# Patient Record
Sex: Male | Born: 1945 | Race: Black or African American | Hispanic: No | Smoking: Former smoker
Health system: Southern US, Community
[De-identification: ages and names within clinical notes are randomized; demographics above are authoritative.]

## PROBLEM LIST (undated history)

## (undated) DIAGNOSIS — R011 Cardiac murmur, unspecified: Secondary | ICD-10-CM

## (undated) DIAGNOSIS — K635 Polyp of colon: Secondary | ICD-10-CM

## (undated) DIAGNOSIS — E785 Hyperlipidemia, unspecified: Secondary | ICD-10-CM

## (undated) DIAGNOSIS — I219 Acute myocardial infarction, unspecified: Secondary | ICD-10-CM

## (undated) DIAGNOSIS — M1990 Unspecified osteoarthritis, unspecified site: Secondary | ICD-10-CM

## (undated) DIAGNOSIS — I10 Essential (primary) hypertension: Secondary | ICD-10-CM

## (undated) DIAGNOSIS — I2510 Atherosclerotic heart disease of native coronary artery without angina pectoris: Secondary | ICD-10-CM

## (undated) HISTORY — DX: Acute myocardial infarction, unspecified: I21.9

## (undated) HISTORY — DX: Hyperlipidemia, unspecified: E78.5

## (undated) HISTORY — DX: Unspecified osteoarthritis, unspecified site: M19.90

## (undated) HISTORY — DX: Essential (primary) hypertension: I10

## (undated) HISTORY — DX: Polyp of colon: K63.5

## (undated) HISTORY — PX: TONSILLECTOMY: SUR1361

## (undated) HISTORY — DX: Cardiac murmur, unspecified: R01.1

## (undated) HISTORY — DX: Atherosclerotic heart disease of native coronary artery without angina pectoris: I25.10

---

## 2000-05-08 HISTORY — PX: CHOLECYSTECTOMY: SHX55

## 2003-05-09 DIAGNOSIS — I219 Acute myocardial infarction, unspecified: Secondary | ICD-10-CM

## 2003-05-09 HISTORY — DX: Acute myocardial infarction, unspecified: I21.9

## 2007-05-09 HISTORY — PX: CORONARY ARTERY BYPASS GRAFT: SHX141

## 2008-09-01 ENCOUNTER — Encounter: Payer: Self-pay | Admitting: Internal Medicine

## 2008-09-18 ENCOUNTER — Encounter: Payer: Self-pay | Admitting: Internal Medicine

## 2008-10-13 ENCOUNTER — Ambulatory Visit: Payer: Self-pay | Admitting: Internal Medicine

## 2008-10-13 DIAGNOSIS — E1169 Type 2 diabetes mellitus with other specified complication: Secondary | ICD-10-CM | POA: Insufficient documentation

## 2008-10-13 DIAGNOSIS — R079 Chest pain, unspecified: Secondary | ICD-10-CM

## 2008-10-13 DIAGNOSIS — I10 Essential (primary) hypertension: Secondary | ICD-10-CM | POA: Insufficient documentation

## 2008-10-13 DIAGNOSIS — I2510 Atherosclerotic heart disease of native coronary artery without angina pectoris: Secondary | ICD-10-CM | POA: Insufficient documentation

## 2008-10-13 DIAGNOSIS — E785 Hyperlipidemia, unspecified: Secondary | ICD-10-CM | POA: Insufficient documentation

## 2008-10-13 DIAGNOSIS — R072 Precordial pain: Secondary | ICD-10-CM

## 2008-10-13 DIAGNOSIS — I2583 Coronary atherosclerosis due to lipid rich plaque: Secondary | ICD-10-CM

## 2008-10-19 ENCOUNTER — Telehealth (INDEPENDENT_AMBULATORY_CARE_PROVIDER_SITE_OTHER): Payer: Self-pay | Admitting: *Deleted

## 2008-10-20 ENCOUNTER — Encounter: Payer: Self-pay | Admitting: Internal Medicine

## 2008-10-20 ENCOUNTER — Ambulatory Visit: Payer: Self-pay

## 2008-10-20 ENCOUNTER — Ambulatory Visit: Payer: Self-pay | Admitting: Internal Medicine

## 2009-09-22 ENCOUNTER — Encounter (INDEPENDENT_AMBULATORY_CARE_PROVIDER_SITE_OTHER): Payer: Self-pay | Admitting: *Deleted

## 2010-02-10 ENCOUNTER — Encounter: Payer: Self-pay | Admitting: Gastroenterology

## 2010-03-21 DIAGNOSIS — Z860100 Personal history of colon polyps, unspecified: Secondary | ICD-10-CM | POA: Insufficient documentation

## 2010-06-07 NOTE — Letter (Signed)
Summary: New Patient letter  [name]  [address] [place], [place] [address]   Phone: [phone]  Fax: [phone]       09/22/2009 MRN: [id number]  Christopher Roberts [address] [place], [place]  [address]  Dear [name],  Welcome to the Gastroenterology Division at [facility].    You are scheduled to see Dr.  Arlyce Dice  on 11-01-09 at 2pm on the 3rd floor at [facility], [address].  We ask that you try to arrive at our office 15 minutes prior to your appointment time to allow for check-in.  We would like you to complete the enclosed self-administered evaluation form prior to your visit and bring it with you on the day of your appointment.  We will review it with you.  Also, please bring a complete list of all your medications or, if you prefer, bring the medication bottles and we will list them.  Please bring your insurance card so that we may make a copy of it.  If your insurance requires a referral to see a specialist, please bring your referral form from your primary care physician.  Co-payments are due at the time of your visit and may be paid by cash, check or credit card.     Your office visit will consist of a consult with your physician (includes a physical exam), any laboratory testing he/she may order, scheduling of any necessary diagnostic testing (e.g. x-ray, ultrasound, CT-scan), and scheduling of a procedure (e.g. Endoscopy, Colonoscopy) if required.  Please allow enough time on your schedule to allow for any/all of these possibilities.    If you cannot keep your appointment, please call [phone] to cancel or reschedule prior to your appointment date.  This allows [place] the opportunity to schedule an appointment for another patient in need of care.  If you do not cancel or reschedule by 5 p.m. the business day prior to your appointment date, you will be charged a $50.00 late cancellation/no-show fee.    Thank you for choosing  [facility] for your medical needs.  We appreciate the opportunity to care for you.  Please visit [place] at our website  to learn more about our practice.                     Sincerely,                                                             The Gastroenterology Division

## 2010-06-07 NOTE — Letter (Signed)
Summary: New Patient letter  [facility]  [address] [place], [place] [address]   Phone: [phone]  Fax: [phone]       02/10/2010 MRN: [id number]  Garret Driggs [address] [place], [place]  [address]  Dear Mr. Ganger,  Welcome to the Gastroenterology Division at [facility].    You are scheduled to see Dr.  Jarold Motto on 03-22-10 at 2:45pm on the 3rd floor at [facility], [address].  We ask that you try to arrive at our office 15 minutes prior to your appointment time to allow for check-in.  We would like you to complete the enclosed self-administered evaluation form prior to your visit and bring it with you on the day of your appointment.  We will review it with you.  Also, please bring a complete list of all your medications or, if you prefer, bring the medication bottles and we will list them.  Please bring your insurance card so that we may make a copy of it.  If your insurance requires a referral to see a specialist, please bring your referral form from your primary care physician.  Co-payments are due at the time of your visit and may be paid by cash, check or credit card.     Your office visit will consist of a consult with your physician (includes a physical exam), any laboratory testing he/she may order, scheduling of any necessary diagnostic testing (e.g. x-ray, ultrasound, CT-scan), and scheduling of a procedure (e.g. Endoscopy, Colonoscopy) if required.  Please allow enough time on your schedule to allow for any/all of these possibilities.    If you cannot keep your appointment, please call [phone] to cancel or reschedule prior to your appointment date.  This allows [place] the opportunity to schedule an appointment for another patient in need of care.  If you do not cancel or reschedule by 5 p.m. the business day prior to your appointment date, you will be charged a $50.00 late cancellation/no-show fee.    Thank you for  choosing [facility] for your medical needs.  We appreciate the opportunity to care for you.  Please visit [place] at our website  to learn more about our practice.                     Sincerely,                                                             The Gastroenterology Division

## 2010-10-17 ENCOUNTER — Encounter: Payer: Self-pay | Admitting: Physician Assistant

## 2010-10-18 ENCOUNTER — Encounter: Payer: Self-pay | Admitting: Physician Assistant

## 2010-10-18 ENCOUNTER — Ambulatory Visit (INDEPENDENT_AMBULATORY_CARE_PROVIDER_SITE_OTHER): Payer: Medicare Other | Admitting: Physician Assistant

## 2010-10-18 VITALS — BP 155/86 | HR 72 | Ht 70.0 in | Wt 223.0 lb

## 2010-10-18 DIAGNOSIS — R011 Cardiac murmur, unspecified: Secondary | ICD-10-CM | POA: Insufficient documentation

## 2010-10-18 DIAGNOSIS — I2510 Atherosclerotic heart disease of native coronary artery without angina pectoris: Secondary | ICD-10-CM

## 2010-10-18 DIAGNOSIS — R079 Chest pain, unspecified: Secondary | ICD-10-CM

## 2010-10-18 DIAGNOSIS — I10 Essential (primary) hypertension: Secondary | ICD-10-CM

## 2010-10-18 DIAGNOSIS — E785 Hyperlipidemia, unspecified: Secondary | ICD-10-CM

## 2010-10-18 DIAGNOSIS — Z720 Tobacco use: Secondary | ICD-10-CM

## 2010-10-18 DIAGNOSIS — R0602 Shortness of breath: Secondary | ICD-10-CM | POA: Insufficient documentation

## 2010-10-18 NOTE — Assessment & Plan Note (Signed)
This is mild.  He does have a history of smoking.  He also has a murmur that sounds benign.  Will get an echocardiogram.

## 2010-10-18 NOTE — Assessment & Plan Note (Signed)
Repeat blood pressure by me optimal.  Recent blood pressure with his PCP was also optimal.  Continue current therapy.

## 2010-10-18 NOTE — Patient Instructions (Signed)
Your physician recommends that you schedule a follow-up appointment in: 3-4 MONTHS WITH DR. Gala Romney AS PER SCOTT WEAVER, PA-C   Your physician has requested that you have en exercise stress Myoview 786.50. For further information please visit [email or web address] Please follow instruction sheet, as given.   Your physician has requested that you have an echocardiogram 786.05, 785.2. Echocardiography is a painless test that uses sound waves to create images of your heart. It provides your doctor with information about the size and shape of your heart and how well your heart's chambers and valves are working. This procedure takes approximately one hour. There are no restrictions for this procedure.

## 2010-10-18 NOTE — Assessment & Plan Note (Signed)
Continue aspirin and statin.  Proceed with stress testing as noted.  Follow up with Dr. Gala Romney 3-4 months.

## 2010-10-18 NOTE — Assessment & Plan Note (Signed)
Somewhat atypical.  However, he did have chest burning prior to his bypass.  It is not that limiting.  It has been 2 years since his last stress test.  This demonstrated very faint anterolateral ischemia.  Proceed with stress Myoview.

## 2010-10-18 NOTE — Progress Notes (Signed)
History of Present Illness: Primary Cardiologist:  Dr. Arvilla Meres  Christopher Roberts is a 65 y.o. male with a h/o CAD s/p MI in 2005 (manifested as indigestion) followed CABG x3 in March '09 in [place].  He also has diabetes, hyperlipidemia, previous tobacco use and hypternsion.  He was seen by Dr. Gala Romney in 6/10 to establish.  He had atypical chest pain.  A stress myoview was done and this demonstrated an EF 50%, septal dyskinesis 2/2 CABG, faint anterolateral ischemia (low risk) and medical therapy continued.  He also had a chest CT which was unremarkable (Impression:  Expected postoperative changes from median sternotomy and CABG.  No acute findings or active disease within the thorax.).  He also changed his meds to include coreg for BP.   He has not followed up and we recently received correspondence from Dr. Chilton Si.  He has had some chest pain.  He follows up today.  He complains of chest burning.  He really has noted this symptom since his bypass.  He has a hypertrophic scar and this is somewhat tender.  He notes it with walking.  He especially notes it with going up hills.  It sometimes stops him.  He can often continue to walk with resolution of his symptoms.  He can sometimes walk without symptoms. He also notes dyspnea with exertion.  He probably describes NYHA class II-IIb symptoms.  He denies orthopnea, PND or edema.  He denies syncope.  He denies any radiation to his jaw or arms.  He denies any symptoms reminiscent of his previous angina.  Past Medical History  Diagnosis Date  . CAD (coronary artery disease)     a. s/p MI in 2005 (symptom of "indigestion"); b. CABG x 3 in 3/09: L-LAD, S-OM, EF unknown  . MI (myocardial infarction) 2005    manifested by indigestion  . Diabetes mellitus   . HTN (hypertension)   . DJD (degenerative joint disease)   . Colon polyps   . HLD (hyperlipidemia)     Current Outpatient Prescriptions  Medication Sig Dispense Refill  . aspirin 81 MG tablet  Take 81 mg by mouth daily.        . carvedilol (COREG) 25 MG tablet Take 25 mg by mouth 2 (two) times daily with a meal.        . clopidogrel (PLAVIX) 75 MG tablet Take 75 mg by mouth daily.        Marland Kitchen LISINOPRIL PO Take by mouth daily.        . metFORMIN (GLUCOPHAGE) 500 MG tablet Take one tablet in the morning and two tablets in the evening by mouth.      . simvastatin (ZOCOR) 80 MG tablet Take 80 mg by mouth at bedtime.        Marland Kitchen DISCONTD: carvedilol (COREG) 25 MG tablet Take 25 mg by mouth 2 (two) times daily with a meal.          Allergies: No Known Allergies  Social Hx:  He is going through a divorce and recently started back smoking 1-2 cigs per day.  Vital Signs: BP 155/86  Pulse 72  Ht 5\' 10"  (1.778 m)  Wt 223 lb (101.152 kg)  BMI 32.00 kg/m2 Repeat BP by me on left:  120/70  PHYSICAL EXAM: Well nourished, well developed, in no acute distress HEENT: normal Neck: no JVD Endocrine: No thyromegaly Vascular: No carotid bruits; DP/PT 2+ bilaterally Cardiac:  normal S1, S2; RRR; Short 1/6 systolic murmur RUSB Lungs:  clear  to auscultation bilaterally, no wheezing, rhonchi or rales Abd: soft, nontender, no hepatomegaly Ext: no edema Skin: warm and dry Neuro:  CNs 2-12 intact, no focal abnormalities noted  EKG:  Normal sinus rhythm, heart rate 70, normal axis, nonspecific ST-T wave changes  ASSESSMENT AND PLAN:

## 2010-10-18 NOTE — Assessment & Plan Note (Signed)
He understands that he needs to quit.

## 2010-10-18 NOTE — Assessment & Plan Note (Signed)
Check an echocardiogram as noted. 

## 2010-10-18 NOTE — Assessment & Plan Note (Signed)
Managed by PCP

## 2010-10-19 ENCOUNTER — Encounter: Payer: Self-pay | Admitting: *Deleted

## 2010-10-25 ENCOUNTER — Ambulatory Visit (HOSPITAL_BASED_OUTPATIENT_CLINIC_OR_DEPARTMENT_OTHER): Payer: Medicare Other | Admitting: Radiology

## 2010-10-25 ENCOUNTER — Ambulatory Visit (INDEPENDENT_AMBULATORY_CARE_PROVIDER_SITE_OTHER): Payer: Medicare Other | Admitting: Physician Assistant

## 2010-10-25 ENCOUNTER — Ambulatory Visit (HOSPITAL_COMMUNITY): Payer: Medicare Other | Attending: Internal Medicine | Admitting: Radiology

## 2010-10-25 ENCOUNTER — Encounter: Payer: Self-pay | Admitting: Physician Assistant

## 2010-10-25 ENCOUNTER — Encounter: Payer: Self-pay | Admitting: *Deleted

## 2010-10-25 VITALS — Ht 70.0 in | Wt 222.0 lb

## 2010-10-25 DIAGNOSIS — E785 Hyperlipidemia, unspecified: Secondary | ICD-10-CM | POA: Insufficient documentation

## 2010-10-25 DIAGNOSIS — R0789 Other chest pain: Secondary | ICD-10-CM

## 2010-10-25 DIAGNOSIS — I2510 Atherosclerotic heart disease of native coronary artery without angina pectoris: Secondary | ICD-10-CM

## 2010-10-25 DIAGNOSIS — R9431 Abnormal electrocardiogram [ECG] [EKG]: Secondary | ICD-10-CM

## 2010-10-25 DIAGNOSIS — R011 Cardiac murmur, unspecified: Secondary | ICD-10-CM

## 2010-10-25 DIAGNOSIS — R0602 Shortness of breath: Secondary | ICD-10-CM

## 2010-10-25 DIAGNOSIS — I252 Old myocardial infarction: Secondary | ICD-10-CM | POA: Insufficient documentation

## 2010-10-25 DIAGNOSIS — I25810 Atherosclerosis of coronary artery bypass graft(s) without angina pectoris: Secondary | ICD-10-CM

## 2010-10-25 DIAGNOSIS — R079 Chest pain, unspecified: Secondary | ICD-10-CM | POA: Insufficient documentation

## 2010-10-25 DIAGNOSIS — R9430 Abnormal result of cardiovascular function study, unspecified: Secondary | ICD-10-CM | POA: Insufficient documentation

## 2010-10-25 DIAGNOSIS — Z01810 Encounter for preprocedural cardiovascular examination: Secondary | ICD-10-CM

## 2010-10-25 DIAGNOSIS — E669 Obesity, unspecified: Secondary | ICD-10-CM | POA: Insufficient documentation

## 2010-10-25 DIAGNOSIS — E119 Type 2 diabetes mellitus without complications: Secondary | ICD-10-CM | POA: Insufficient documentation

## 2010-10-25 LAB — PROTIME-INR: INR: 1.1 ratio — ABNORMAL HIGH (ref 0.8–1.0)

## 2010-10-25 LAB — CBC WITH DIFFERENTIAL/PLATELET
Basophils Relative: 0.2 % (ref 0.0–3.0)
Eosinophils Relative: 0.8 % (ref 0.0–5.0)
HCT: 43 % (ref 39.0–52.0)
Hemoglobin: 14.8 g/dL (ref 13.0–17.0)
MCV: 89.7 fl (ref 78.0–100.0)
Monocytes Absolute: 0.5 10*3/uL (ref 0.1–1.0)
Neutro Abs: 6.3 10*3/uL (ref 1.4–7.7)
Neutrophils Relative %: 64 % (ref 43.0–77.0)
RBC: 4.8 Mil/uL (ref 4.22–5.81)
WBC: 9.9 10*3/uL (ref 4.5–10.5)

## 2010-10-25 MED ORDER — TECHNETIUM TC 99M TETROFOSMIN IV KIT
33.0000 | PACK | Freq: Once | INTRAVENOUS | Status: AC | PRN
  Administered 2010-10-25: 33 via INTRAVENOUS

## 2010-10-25 MED ORDER — TECHNETIUM TC 99M TETROFOSMIN IV KIT
11.0000 | PACK | Freq: Once | INTRAVENOUS | Status: AC | PRN
  Administered 2010-10-25: 11 via INTRAVENOUS

## 2010-10-25 MED ORDER — ISOSORBIDE MONONITRATE ER 30 MG PO TB24: 30.0000 mg | ORAL_TABLET | Freq: Every day | ORAL | Status: DC

## 2010-10-25 MED ORDER — NITROGLYCERIN 0.4 MG SL SUBL: 0.4000 mg | SUBLINGUAL_TABLET | SUBLINGUAL | Status: DC | PRN

## 2010-10-25 MED ORDER — REGADENOSON 0.4 MG/5ML IV SOLN
0.4000 mg | Freq: Once | INTRAVENOUS | Status: AC
  Administered 2010-10-25: 0.4 mg via INTRAVENOUS

## 2010-10-25 NOTE — Progress Notes (Signed)
History of Present Illness: Primary Cardiologist:  Dr. Arvilla Meres  Christopher Roberts is a 65 y.o. male with a h/o CAD s/p MI in 2005 (manifested as indigestion) followed CABG x3 in March '09 in [place].  He also has diabetes, hyperlipidemia, previous tobacco use and hypternsion.  He was seen by Dr. Gala Romney in 6/10 to establish.  He had atypical chest pain.  A stress myoview was done and this demonstrated an EF 50%, septal dyskinesis 2/2 CABG, faint anterolateral ischemia (low risk) and medical therapy continued.  He also had a chest CT which was unremarkable (Impression:  Expected postoperative changes from median sternotomy and CABG.  No acute findings or active disease within the thorax.).  He also changed his meds to include coreg for BP.   He has not followed up and we recently received correspondence from Dr. Chilton Si.  He has had some chest pain.  I saw him Last week.  He complained of chest burning with exertion.  He also had a murmur on exam.  He was set up for an echocardiogram and stress Myoview.  His tests were done today.  He had chest pain on the treadmill.  He also had ST segment depression.  His images were abnormal with evidence of anterolateral ischemia.  The stress test was reviewed by Dr. Graciela Husbands, the DOD.  It was recommended that the patient be set up for cardiac catheterization.  He denies any significant changes in his chest discomfort.  He denies any symptoms at rest.  He denies syncope.  Past Medical History  Diagnosis Date  . CAD (coronary artery disease)     a. s/p MI in 2005 (symptom of "indigestion"); b. CABG x 3 in 3/09: L-LAD, S-OM, EF unknown  . MI (myocardial infarction) 2005    manifested by indigestion  . Diabetes mellitus   . HTN (hypertension)   . DJD (degenerative joint disease)   . Colon polyps   . HLD (hyperlipidemia)     Past Surgical History  Procedure Date  . Coronary artery bypass graft 2009    x3  . Cholecystectomy 2002  . Tonsillectomy       Current Outpatient Prescriptions  Medication Sig Dispense Refill  . aspirin 81 MG tablet Take 81 mg by mouth daily.        . carvedilol (COREG) 25 MG tablet Take 25 mg by mouth 2 (two) times daily with a meal.        . clopidogrel (PLAVIX) 75 MG tablet Take 75 mg by mouth daily.        Marland Kitchen LISINOPRIL PO Take 5 mg by mouth daily.       . metFORMIN (GLUCOPHAGE) 500 MG tablet Take one tablet in the morning and two tablets in the evening by mouth.      . simvastatin (ZOCOR) 80 MG tablet Take 80 mg by mouth at bedtime.         No current facility-administered medications for this visit.   Facility-Administered Medications Ordered in Other Visits  Medication Dose Route Frequency Provider Last Rate Last Dose  . regadenoson (LEXISCAN) injection SOLN 0.4 mg  0.4 mg Intravenous Once Dolores Patty, MD   0.4 mg at 10/25/10 0858  . technetium tetrofosmin (TC-MYOVIEW) injection 11 milli Curie  11 milli Curie Intravenous Once PRN Luis Abed, MD   11 milli Curie at 10/25/10 0740  . technetium tetrofosmin (TC-MYOVIEW) injection 33 milli Curie  33 milli Curie Intravenous Once PRN Luis Abed, MD  33 milli Curie at 10/25/10 0900    Allergies: No Known Allergies  Social Hx:  He is going through a divorce and recently started back smoking 1-2 cigs per day.  ROS:  See HPI.  He denies fevers, chills, cough, melena, hematochezia.  All other systems reviewed and negative.  Vital Signs: There were no vitals taken for this visit. Blood pressure before his stress test 157/93 with a pulse of 65  PHYSICAL EXAM: Well nourished, well developed, in no acute distress HEENT: normal Neck: no JVD Endocrine: No thyromegaly Vascular: No carotid bruits; DP/PT 2+ bilaterally; No femoral artery bruits noted bilaterally Cardiac:  normal S1, S2; RRR; Short 1/6 systolic murmur RUSB Lungs:  clear to auscultation bilaterally, no wheezing, rhonchi or rales Abd: soft, nontender, no hepatomegaly Ext: no  edema Skin: warm and dry Neuro:  CNs 2-12 intact, no focal abnormalities noted Psych: Normal affect   ASSESSMENT AND PLAN:

## 2010-10-25 NOTE — Assessment & Plan Note (Signed)
Proceed with cardiac catheterization as noted.

## 2010-10-25 NOTE — Patient Instructions (Signed)
Your physician recommends that you schedule a follow-up appointment in: WILL SCHEDULE FROM HOSPITAL  Your physician has recommended you make the following change in your medication: START IMDUR 30  MG EVERY DAY   AND NITROSTAT  TAKE AS NEEDED  Your physician has requested that you have a cardiac catheterization. Cardiac catheterization is used to diagnose and/or treat various heart conditions. Doctors may recommend this procedure for a number of different reasons. The most common reason is to evaluate chest pain. Chest pain can be a symptom of coronary artery disease (CAD), and cardiac catheterization can show whether plaque is narrowing or blocking your heart's arteries. This procedure is also used to evaluate the valves, as well as measure the blood flow and oxygen levels in different parts of your heart. For further information please visit [email or web address] Please follow instruction sheet, as given. FRI 10/28/10  Your physician recommends that you return for lab work in: TODAY BMET CBC PT PTT  DX V72.81

## 2010-10-25 NOTE — Assessment & Plan Note (Signed)
His stress test is abnormal.  He has a history of CAD with bypass in 2009.  He is having symptoms of exertional chest burning.  Cardiac catheterization is recommended.  Risks and benefits of cardiac catheterization have been discussed with the patient.  These include bleeding, infection, kidney damage, stroke, heart attack, death.  The patient understands these risks and is willing to proceed.   I will place him on isosorbide 30 mg a day.  I will also refill his nitroglycerin p.r.n.  He has been advised to not do anything strenuous until his cardiac catheterization.  This will be set up sometime this week.  He knows to go to the emergency room should he have any unstable symptoms.

## 2010-10-25 NOTE — Progress Notes (Signed)
[facility] SITE 3 NUCLEAR MED [address] [place] [address] [phone]  Cardiology Nuclear Med Study  Christopher Roberts is a 65 y.o. male [id number] 03-06-1946   Nuclear Med Background Indication for Stress Test:  Evaluation for Ischemia and Graft Patency History:  '09 CABGx 3 ([place]), '10 CT/MRI: [place], '05 Myocardial Infarction and '10 Myocardial Perfusion Study: EF 50% min. Anterior/lateral ischemia Cardiac Risk Factors: Family History - CAD, History of Smoking, Hypertension, Lipids, NIDDM and Smoker  Symptoms:  Chest Pain, Chest Burning, DOE, Palpitations and SOB   Nuclear Pre-Procedure Caffeine/Decaff Intake:  None NPO After: 9:30pm   Lungs:  clear IV 0.9% NS with Angio Cath:  20g  IV Site: R Forearm  IV Started by:  Irean Hong, RN  Chest Size (in):  44 Cup Size: n/a  Height: 5\' 10"  (1.778 m)  Weight:  222 lb (100.699 kg)  BMI:  Body mass index is 31.85 kg/(m^2). Tech Comments:  Held coreg x 24 hrs. This patient started with an exercise Myoview and at the 6 minute mark he was switched to a walking Lexiscan. During exercise he couldn't get his HR up, he became very SOB, fatigued, and had chest aching 5/10. There were + changes on his EKG as well.     Nuclear Med Study 1 or 2 day study: 1 day  Stress Test Type:  Treadmill/Lexiscan  Reading MD: Willa Rough, MD  Order Authorizing Provider:  D.Bensimhon  Resting Radionuclide: Technetium 31m Tetrofosmin  Resting Radionuclide Dose: 10.8 mCi   Stress Radionuclide:  Technetium 58m Tetrofosmin  Stress Radionuclide Dose: 33 mCi           Stress Protocol Rest HR: 65 Stress HR: 118  Rest BP: 157/93 Stress BP: 200/89  Exercise Time (min): 7:21 METS: 7.2   Predicted Max HR: 155 bpm % Max HR: 76.13 bpm Rate Pressure Product: 44034   Dose of Adenosine (mg):  n/a Dose of Lexiscan: 0.4 mg  Dose of Atropine (mg): n/a Dose of Dobutamine: n/a mcg/kg/min (at max HR)  Stress Test Technologist: Milana Na,  EMT-P  Nuclear Technologist:  Domenic Polite, CNMT     Rest Procedure:  Myocardial perfusion imaging was performed at rest 45 minutes following the intravenous administration of Technetium 14m Tetrofosmin. Rest ECG: NSR with PVCS  Stress Procedure:  The patient received IV Lexiscan 0.4 mg over 15-seconds with concurrent low level exercise and then Technetium 38m Tetrofosmin was injected at 30-seconds while the patient continued walking one more minute.  There were + significant changes and occ pvcs with Lexiscan.  Quantitative spect images were obtained after a 45-minute delay. Stress ECG: There is ST depression with stress.  QPS Raw Data Images:  Patient motion noted; appropriate software correction applied. Stress Images:  There is moderate decrease in activity in a moderate area of the mid anterior segment and the mid and apical anterolateral wall. Rest Images:  Normal homogeneous uptake in all areas of the myocardium. Subtraction (SDS):  There is ischemia Transient Ischemic Dilatation (Normal <1.22):  .76 Lung/Heart Ratio (Normal <0.45):  .33  Quantitative Gated Spect Images QGS EDV:  78 ml QGS ESV:  29 ml QGS cine images:  Septal dyssynergy QGS EF:63%  Impression Exercise Capacity:  Lexiscan with low level exercise. BP Response:  Hypertensive blood pressure response. Clinical Symptoms:  Chest ache. ECG Impression:  Abnormal Comparison with Prior Nuclear Study:  New abnormality since 2010  Overall Impression:  There was hypertensive response to walking. There was inadequate  heart rate and study changed to Lexiscan. There is ST depression seen. The images show moderate ischemia in the anterolateral wall. Dr. Gala Romney saw the study immediately, and the patient is to undergo cath.  Willa Rough

## 2010-10-25 NOTE — Assessment & Plan Note (Addendum)
Echocardiogram done today.  This was reviewed by me.  The report indicates he is normally function, upper septal thickening but no LVOT gradient and no valvular abnormalities.

## 2010-10-25 NOTE — Assessment & Plan Note (Signed)
Elevated today.  However, he has not taken his blood pressure medicines yet today.  Add isosorbide to his regimen.

## 2010-10-26 ENCOUNTER — Encounter: Payer: Self-pay | Admitting: *Deleted

## 2010-10-26 ENCOUNTER — Other Ambulatory Visit (INDEPENDENT_AMBULATORY_CARE_PROVIDER_SITE_OTHER): Payer: Medicare Other | Admitting: *Deleted

## 2010-10-26 ENCOUNTER — Other Ambulatory Visit: Payer: Self-pay | Admitting: *Deleted

## 2010-10-26 DIAGNOSIS — I2510 Atherosclerotic heart disease of native coronary artery without angina pectoris: Secondary | ICD-10-CM

## 2010-10-26 DIAGNOSIS — Z01810 Encounter for preprocedural cardiovascular examination: Secondary | ICD-10-CM

## 2010-10-26 DIAGNOSIS — R0602 Shortness of breath: Secondary | ICD-10-CM

## 2010-10-26 DIAGNOSIS — R072 Precordial pain: Secondary | ICD-10-CM

## 2010-10-26 DIAGNOSIS — R079 Chest pain, unspecified: Secondary | ICD-10-CM

## 2010-10-26 LAB — BASIC METABOLIC PANEL
BUN: 16 mg/dL (ref 6–23)
CO2: 22 mEq/L (ref 19–32)
Chloride: 109 mEq/L (ref 96–112)
Potassium: 4 mEq/L (ref 3.5–5.1)

## 2010-10-28 ENCOUNTER — Inpatient Hospital Stay (HOSPITAL_BASED_OUTPATIENT_CLINIC_OR_DEPARTMENT_OTHER)
Admission: RE | Admit: 2010-10-28 | Discharge: 2010-10-28 | Disposition: A | Discharge status: Home / Self Care | Payer: Medicare Other | Source: Ambulatory Visit | Attending: Cardiology | Admitting: Cardiology

## 2010-10-28 DIAGNOSIS — I25810 Atherosclerosis of coronary artery bypass graft(s) without angina pectoris: Secondary | ICD-10-CM | POA: Insufficient documentation

## 2010-10-28 DIAGNOSIS — R9439 Abnormal result of other cardiovascular function study: Secondary | ICD-10-CM | POA: Insufficient documentation

## 2010-10-28 DIAGNOSIS — I2510 Atherosclerotic heart disease of native coronary artery without angina pectoris: Secondary | ICD-10-CM

## 2010-10-28 DIAGNOSIS — R0789 Other chest pain: Secondary | ICD-10-CM | POA: Insufficient documentation

## 2010-10-28 DIAGNOSIS — I2582 Chronic total occlusion of coronary artery: Secondary | ICD-10-CM | POA: Insufficient documentation

## 2010-10-28 LAB — POCT I-STAT GLUCOSE
Glucose, Bld: 157 mg/dL — ABNORMAL HIGH (ref 70–99)
Operator id: 221371

## 2010-11-02 ENCOUNTER — Encounter: Payer: Self-pay | Admitting: Internal Medicine

## 2010-11-02 ENCOUNTER — Ambulatory Visit (INDEPENDENT_AMBULATORY_CARE_PROVIDER_SITE_OTHER): Payer: Medicare Other | Admitting: Internal Medicine

## 2010-11-02 VITALS — BP 96/58 | HR 73 | Ht 70.0 in | Wt 225.0 lb

## 2010-11-02 DIAGNOSIS — I10 Essential (primary) hypertension: Secondary | ICD-10-CM

## 2010-11-02 DIAGNOSIS — I2510 Atherosclerotic heart disease of native coronary artery without angina pectoris: Secondary | ICD-10-CM

## 2010-11-02 NOTE — Patient Instructions (Signed)
Check your BP and let us know if staying below 100 on the top  Your physician wants you to follow-up in: 4 months.  You will receive a reminder letter in the mail two months in advance. If you don't receive a letter, please call our office to schedule the follow-up appointment.

## 2010-11-02 NOTE — Progress Notes (Signed)
History of Present Illness: PCP: Dr. Nila Nephew  Christopher Roberts is a 65 y.o. male with a h/o CAD s/p MI in 2005 (manifested as indigestion) followed CABG x3 in March '09 in [place].  He also has diabetes, hyperlipidemia, previous tobacco use and hypternsion.    Recently had CP and saw Tereso Newcomer. He was set up for an echocardiogram and stress Myoview.  His images were abnormal with evidence of anterolateral ischemia.  Underwent cath 10/28/10:  Which showed:    1. Preserved overall left ventricular function.   2. High-grade left anterior descending stenosis with patent internal       mammary to the distal LAD.   3. Occluded vein graft limb to the ramus intermedius with       collateralization of this vessel from multiple sources.   4. Small, but severely diseased first diagonal branch.   5. Distal circumflex with 80% stenosis.   6. 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   Treated medically with suggestion that distal LCX could be stented if CP persisted. Started on NTG an has done very well No further CP. Referred to cardiac rehab. BP has been soft. No syncope      Past Medical History  Diagnosis Date  . CAD (coronary artery disease)     a. s/p MI in 2005 (symptom of "indigestion"); b. CABG x 3 in 3/09: L-LAD, S-OM, EF unknown  . MI (myocardial infarction) 2005    manifested by indigestion  . Diabetes mellitus   . HTN (hypertension)   . DJD (degenerative joint disease)   . Colon polyps   . HLD (hyperlipidemia)   . Murmur     echo 6/12:  Upper septal thickening, no LVOT gradient,, EF 65%, mild LAE      Past Surgical History  Procedure Date  . Coronary artery bypass graft 2009    x3  . Cholecystectomy 2002  . Tonsillectomy      Current Outpatient Prescriptions  Medication Sig Dispense Refill  . aspirin 81 MG tablet Take 81 mg by mouth daily.        . carvedilol (COREG) 25 MG tablet Take 25 mg by mouth 2 (two) times daily  with a meal.        . clopidogrel (PLAVIX) 75 MG tablet Take 75 mg by mouth daily.        . isosorbide mononitrate (IMDUR) 30 MG 24 hr tablet Take 1 tablet (30 mg total) by mouth daily.  30 tablet  11  . LISINOPRIL PO Take 5 mg by mouth daily.       . metFORMIN (GLUCOPHAGE) 500 MG tablet Take one tablet in the morning and two tablets in the evening by mouth.      . nitroGLYCERIN (NITROSTAT) 0.4 MG SL tablet Place 1 tablet (0.4 mg total) under the tongue every 5 (five) minutes as needed for chest pain.  100 tablet  3  . simvastatin (ZOCOR) 80 MG tablet Take 80 mg by mouth at bedtime.          Allergies: No Known Allergies  Social Hx:  He is going through a divorce and recently started back smoking 1-2 cigs per day.  ROS:  See HPI.  He denies fevers, chills, cough, melena, hematochezia.  All other systems reviewed and negative.  Vital Signs: BP 96/58  Pulse 73  Ht 5\' 10"  (1.778 m)  Wt 225 lb (102.059 kg)  BMI 32.28 kg/m2  PHYSICAL EXAM: Well nourished, well developed, in no acute distress HEENT: normal Neck: no JVD Endocrine: No thyromegaly Vascular: No carotid bruits; DP/PT 2+ bilaterally; No femoral artery bruits noted bilaterally Cardiac:  normal S1, S2; RRR; Short 1/6 systolic murmur RUSB Lungs:  clear to auscultation bilaterally, no wheezing, rhonchi or rales Abd: soft, nontender, no hepatomegaly Ext: no edema Skin: warm and dry Neuro:  CNs 2-12 intact, no focal abnormalities noted Psych: Normal affect   ASSESSMENT AND PLAN:

## 2010-11-09 NOTE — Assessment & Plan Note (Signed)
As per CAD section.

## 2010-11-09 NOTE — Assessment & Plan Note (Signed)
Reviewed cath results with him in detail. Doing well with medical therapy. Will continue.I think he will also benefit from cardiac rehab. BP is on low side but i would prefer to continue Imdur and beta-blocker for his heart disease and low-dose ACE-I for his DM2.  He will follow his BPs closely. If SBP consistently < 100 or developing symptoms may need to stop lisinopril.

## 2010-11-24 NOTE — Cardiovascular Report (Signed)
Christopher Roberts, Christopher Roberts                ACCOUNT NO.:  [id number]  MEDICAL RECORD NO.:  [id number]  LOCATION:                                 FACILITY:  PHYSICIAN:  Arturo Morton. Riley Kill, MD, FACCDATE OF BIRTH:  [birth year]  DATE OF PROCEDURE: DATE OF DISCHARGE:                           CARDIAC CATHETERIZATION   INDICATIONS:  Christopher Roberts is a gentleman who previously underwent bypass surgery in [place] in 2009.  I have reviewed both his catheterization report as well as his operative note.  He had an internal mammary to the LAD and sequential saphenous vein graft to the ramus distal circumflex or PDA.  The RCA was occluded, not bypassed, felt to be nondominant.  He has had some chest tightness, and his nuclear study demonstrates anterior anterolateral ischemia.  There is complete redistribution.  The current study was done to assess coronary anatomy and his graft status. Risks, benefits, and alternatives were discussed with the patient.  He consented to proceed.  PROCEDURE:  Procedure was performed from the right femoral artery using 4-French catheters.  He tolerated the procedure without complication, was taken to the holding area in satisfactory clinical condition.  I reviewed the films with the patient and subsequently with the daughter.  HEMODYNAMIC DATA: 1. The central aortic pressure is 112/77, mean 93. 2. LV pressure 123/22. 3. There is no gradient or pullback across the aortic valve.  ANGIOGRAPHIC DATA: 1. Ventriculography was done in the RAO projection.  Overall systolic     function was preserved.  No segmental abnormalities or contraction     were identified.  Ejection fraction was sought to be in excess of     60%. 2. The left main coronary artery was tapered distally probably about     50%-60%. 3. The left anterior descending artery demonstrates competitive     filling in the distal vessel.  There is an 80% lesion beyond the     takeoff of the ramus and the diagonal.   The ramus is diseased in     the midportion, and collaterals are noted from other locations.     There is a diagonal branch which is small and severely diseased and     could also be a source of anterolateral ischemia. 4. The AV circumflex has diffuse 50% narrowing and then there is the     takeoff of a marginal branch or posterolateral branch distally.     Distal to this, there is an AV circumflex, which incompletely     fills.  The leading into that marginal branch was about 80%     narrowing and then a 50%-60% area of narrowing as well and this is     in the inferolateral segment. 5. There is a saphenous vein graft which is widely patent.  It goes to     what appears to be the posterior descending branch or co-posterior     descending branch distally.  There is proximal anastomosis that was     suppose to be on this graft, going to the ramus, and that is not     visualized. 6. The internal mammary to the distal LAD is widely patent.  There are     collaterals going to the ramus intermedius from this territory.     There are also collaterals noted during the native left injection     to the ramus intermedius. 7. The right coronary artery is totally occluded and the distal right     coronary fills by collaterals from septal perforators and AV     circumflex native vessel.  CONCLUSION: 1. Preserved overall left ventricular function. 2. High-grade left anterior descending stenosis with patent internal     mammary to the distal LAD. 3. Occluded vein graft limb to the ramus intermedius with     collateralization of this vessel from multiple sources. 4. Small, but severely diseased first diagonal branch. 5. Distal circumflex with 80% stenosis. 6. Total occlusion of the right coronary artery with left-to-right     collaterals.  DISPOSITION:  At the present time, the internal mammary graft and the vein graft to the distal PDA are widely patent.  The limb to the ramus intermedius is  occluded, but this is well collateralized.  The diagonal ramus may well be the source of the patient's ischemia.  The circumflex distally is a target, could potentially be approached, but would not completely resolve the ischemia based on the anatomic location.  At the present time, medical therapy would be recommended, with close follow-up with Dr. Gala Romney.     Arturo Morton. Riley Kill, MD, [facility]     TDS/MEDQ  D:  10/28/2010  T:  10/29/2010  Job:  [id number]  cc:   Bevelyn Buckles. Bensimhon, MD Erskine Speed, M.D. CV laboratory  Electronically Signed by Shawnie Pons MD [facility] on 11/24/2010 07:23:17 AM

## 2010-12-21 ENCOUNTER — Ambulatory Visit: Payer: Medicare Other | Admitting: Internal Medicine

## 2011-07-18 ENCOUNTER — Other Ambulatory Visit: Payer: Self-pay | Admitting: *Deleted

## 2011-07-20 ENCOUNTER — Other Ambulatory Visit: Payer: Self-pay | Admitting: *Deleted

## 2011-07-20 DIAGNOSIS — R9430 Abnormal result of cardiovascular function study, unspecified: Secondary | ICD-10-CM

## 2011-07-20 MED ORDER — ISOSORBIDE MONONITRATE ER 30 MG PO TB24: 30.0000 mg | ORAL_TABLET | Freq: Every day | ORAL | Status: DC

## 2012-05-07 ENCOUNTER — Ambulatory Visit (INDEPENDENT_AMBULATORY_CARE_PROVIDER_SITE_OTHER): Payer: 59 | Admitting: Licensed Clinical Social Worker

## 2012-05-07 DIAGNOSIS — F411 Generalized anxiety disorder: Secondary | ICD-10-CM

## 2012-05-07 DIAGNOSIS — F331 Major depressive disorder, recurrent, moderate: Secondary | ICD-10-CM

## 2012-05-21 ENCOUNTER — Ambulatory Visit (INDEPENDENT_AMBULATORY_CARE_PROVIDER_SITE_OTHER): Payer: 59 | Admitting: Licensed Clinical Social Worker

## 2012-05-21 DIAGNOSIS — F331 Major depressive disorder, recurrent, moderate: Secondary | ICD-10-CM

## 2012-05-21 DIAGNOSIS — F409 Phobic anxiety disorder, unspecified: Secondary | ICD-10-CM

## 2012-06-05 ENCOUNTER — Ambulatory Visit: Payer: 59 | Admitting: Licensed Clinical Social Worker

## 2012-07-23 ENCOUNTER — Other Ambulatory Visit: Payer: Self-pay | Admitting: *Deleted

## 2012-07-23 DIAGNOSIS — R9430 Abnormal result of cardiovascular function study, unspecified: Secondary | ICD-10-CM

## 2012-07-23 MED ORDER — ISOSORBIDE MONONITRATE ER 30 MG PO TB24: 30.0000 mg | ORAL_TABLET | Freq: Every day | ORAL | Status: DC

## 2012-10-21 ENCOUNTER — Other Ambulatory Visit (HOSPITAL_COMMUNITY): Payer: Self-pay | Admitting: *Deleted

## 2012-10-21 DIAGNOSIS — R9430 Abnormal result of cardiovascular function study, unspecified: Secondary | ICD-10-CM

## 2012-10-21 MED ORDER — ISOSORBIDE MONONITRATE ER 30 MG PO TB24: 30.0000 mg | ORAL_TABLET | Freq: Every day | ORAL | Status: DC

## 2014-07-06 DIAGNOSIS — E1165 Type 2 diabetes mellitus with hyperglycemia: Secondary | ICD-10-CM | POA: Diagnosis not present

## 2014-07-06 DIAGNOSIS — I2510 Atherosclerotic heart disease of native coronary artery without angina pectoris: Secondary | ICD-10-CM | POA: Diagnosis not present

## 2014-07-06 DIAGNOSIS — I10 Essential (primary) hypertension: Secondary | ICD-10-CM | POA: Diagnosis not present

## 2014-10-19 DIAGNOSIS — H33102 Unspecified retinoschisis, left eye: Secondary | ICD-10-CM | POA: Diagnosis not present

## 2014-10-19 DIAGNOSIS — E11339 Type 2 diabetes mellitus with moderate nonproliferative diabetic retinopathy without macular edema: Secondary | ICD-10-CM | POA: Diagnosis not present

## 2014-10-19 DIAGNOSIS — H4423 Degenerative myopia, bilateral: Secondary | ICD-10-CM | POA: Diagnosis not present

## 2015-09-17 DIAGNOSIS — E109 Type 1 diabetes mellitus without complications: Secondary | ICD-10-CM | POA: Diagnosis not present

## 2015-09-17 DIAGNOSIS — Z125 Encounter for screening for malignant neoplasm of prostate: Secondary | ICD-10-CM | POA: Diagnosis not present

## 2015-09-17 DIAGNOSIS — E7800 Pure hypercholesterolemia, unspecified: Secondary | ICD-10-CM | POA: Diagnosis not present

## 2015-09-17 DIAGNOSIS — D559 Anemia due to enzyme disorder, unspecified: Secondary | ICD-10-CM | POA: Diagnosis not present

## 2015-12-14 DIAGNOSIS — Z23 Encounter for immunization: Secondary | ICD-10-CM | POA: Diagnosis not present

## 2015-12-14 DIAGNOSIS — E109 Type 1 diabetes mellitus without complications: Secondary | ICD-10-CM | POA: Diagnosis not present

## 2016-06-19 DIAGNOSIS — I2510 Atherosclerotic heart disease of native coronary artery without angina pectoris: Secondary | ICD-10-CM | POA: Diagnosis not present

## 2016-06-19 DIAGNOSIS — H6122 Impacted cerumen, left ear: Secondary | ICD-10-CM | POA: Diagnosis not present

## 2016-06-19 DIAGNOSIS — E119 Type 2 diabetes mellitus without complications: Secondary | ICD-10-CM | POA: Diagnosis not present

## 2016-08-30 ENCOUNTER — Observation Stay (HOSPITAL_COMMUNITY)
Admission: EM | Admit: 2016-08-30 | Discharge: 2016-09-02 | Disposition: A | Discharge status: Home / Self Care | Payer: Medicare Other | Attending: Nephrology | Admitting: Nephrology

## 2016-08-30 ENCOUNTER — Encounter (HOSPITAL_COMMUNITY): Payer: Self-pay | Admitting: Emergency Medicine

## 2016-08-30 ENCOUNTER — Emergency Department (HOSPITAL_COMMUNITY): Payer: Medicare Other

## 2016-08-30 DIAGNOSIS — I25810 Atherosclerosis of coronary artery bypass graft(s) without angina pectoris: Secondary | ICD-10-CM | POA: Insufficient documentation

## 2016-08-30 DIAGNOSIS — Z7982 Long term (current) use of aspirin: Secondary | ICD-10-CM | POA: Diagnosis not present

## 2016-08-30 DIAGNOSIS — R938 Abnormal findings on diagnostic imaging of other specified body structures: Secondary | ICD-10-CM | POA: Insufficient documentation

## 2016-08-30 DIAGNOSIS — R112 Nausea with vomiting, unspecified: Secondary | ICD-10-CM | POA: Diagnosis not present

## 2016-08-30 DIAGNOSIS — R42 Dizziness and giddiness: Secondary | ICD-10-CM

## 2016-08-30 DIAGNOSIS — I2510 Atherosclerotic heart disease of native coronary artery without angina pectoris: Secondary | ICD-10-CM | POA: Diagnosis present

## 2016-08-30 DIAGNOSIS — I160 Hypertensive urgency: Secondary | ICD-10-CM | POA: Diagnosis not present

## 2016-08-30 DIAGNOSIS — R1110 Vomiting, unspecified: Secondary | ICD-10-CM | POA: Diagnosis present

## 2016-08-30 DIAGNOSIS — R1114 Bilious vomiting: Secondary | ICD-10-CM

## 2016-08-30 DIAGNOSIS — I639 Cerebral infarction, unspecified: Secondary | ICD-10-CM

## 2016-08-30 DIAGNOSIS — I10 Essential (primary) hypertension: Secondary | ICD-10-CM | POA: Diagnosis not present

## 2016-08-30 DIAGNOSIS — E119 Type 2 diabetes mellitus without complications: Secondary | ICD-10-CM

## 2016-08-30 DIAGNOSIS — I252 Old myocardial infarction: Secondary | ICD-10-CM | POA: Insufficient documentation

## 2016-08-30 DIAGNOSIS — Z951 Presence of aortocoronary bypass graft: Secondary | ICD-10-CM | POA: Insufficient documentation

## 2016-08-30 DIAGNOSIS — Z79899 Other long term (current) drug therapy: Secondary | ICD-10-CM | POA: Diagnosis not present

## 2016-08-30 DIAGNOSIS — F17210 Nicotine dependence, cigarettes, uncomplicated: Secondary | ICD-10-CM | POA: Insufficient documentation

## 2016-08-30 DIAGNOSIS — I2583 Coronary atherosclerosis due to lipid rich plaque: Secondary | ICD-10-CM

## 2016-08-30 LAB — URINALYSIS, ROUTINE W REFLEX MICROSCOPIC
BACTERIA UA: NONE SEEN
Bilirubin Urine: NEGATIVE
Glucose, UA: >=500 mg/dL — AB
Ketones, ur: 5 mg/dL — AB
Leukocytes, UA: NEGATIVE
NITRITE: NEGATIVE
PH: 5 (ref 5.0–8.0)
Protein, ur: >=300 mg/dL — AB
SPECIFIC GRAVITY, URINE: 1.017 (ref 1.005–1.030)

## 2016-08-30 LAB — COMPREHENSIVE METABOLIC PANEL
ALBUMIN: 4 g/dL (ref 3.5–5.0)
ALK PHOS: 67 U/L (ref 38–126)
ALT: 14 U/L — ABNORMAL LOW (ref 17–63)
ANION GAP: 11 (ref 5–15)
AST: 18 U/L (ref 15–41)
BILIRUBIN TOTAL: 0.8 mg/dL (ref 0.3–1.2)
BUN: 11 mg/dL (ref 6–20)
CALCIUM: 9.3 mg/dL (ref 8.9–10.3)
CO2: 20 mmol/L — ABNORMAL LOW (ref 22–32)
Chloride: 105 mmol/L (ref 101–111)
Creatinine, Ser: 1.01 mg/dL (ref 0.61–1.24)
GFR calc non Af Amer: >60 mL/min (ref >60)
GLUCOSE: 225 mg/dL — AB (ref 65–99)
POTASSIUM: 4.1 mmol/L (ref 3.5–5.1)
Sodium: 136 mmol/L (ref 135–145)
TOTAL PROTEIN: 7.6 g/dL (ref 6.5–8.1)

## 2016-08-30 LAB — CBC WITH DIFFERENTIAL/PLATELET
BASOS PCT: 0 %
Basophils Absolute: 0 10*3/uL (ref 0.0–0.1)
EOS ABS: 0.2 10*3/uL (ref 0.0–0.7)
Eosinophils Relative: 1 %
HEMATOCRIT: 43.6 % (ref 39.0–52.0)
HEMOGLOBIN: 15.1 g/dL (ref 13.0–17.0)
LYMPHS ABS: 3.4 10*3/uL (ref 0.7–4.0)
Lymphocytes Relative: 29 %
MCH: 29.1 pg (ref 26.0–34.0)
MCHC: 34.6 g/dL (ref 30.0–36.0)
MCV: 84 fL (ref 78.0–100.0)
MONO ABS: 0.7 10*3/uL (ref 0.1–1.0)
MONOS PCT: 6 %
NEUTROS ABS: 7.6 10*3/uL (ref 1.7–7.7)
Neutrophils Relative %: 64 %
Platelets: 191 10*3/uL (ref 150–400)
RBC: 5.19 MIL/uL (ref 4.22–5.81)
RDW: 13 % (ref 11.5–15.5)
WBC: 11.9 10*3/uL — ABNORMAL HIGH (ref 4.0–10.5)

## 2016-08-30 LAB — I-STAT TROPONIN, ED: Troponin i, poc: 0.01 ng/mL (ref 0.00–0.08)

## 2016-08-30 MED ORDER — ONDANSETRON 4 MG PO TBDP
4.0000 mg | ORAL_TABLET | Freq: Once | ORAL | Status: AC
  Administered 2016-08-30: 4 mg via ORAL

## 2016-08-30 MED ORDER — MECLIZINE HCL 25 MG PO TABS
25.0000 mg | ORAL_TABLET | Freq: Once | ORAL | Status: AC
  Administered 2016-08-30: 25 mg via ORAL
  Filled 2016-08-30: qty 1

## 2016-08-30 MED ORDER — PROMETHAZINE HCL 25 MG PO TABS: 25.0000 mg | ORAL_TABLET | Freq: Four times a day (QID) | ORAL | Status: DC | PRN

## 2016-08-30 MED ORDER — LABETALOL HCL 5 MG/ML IV SOLN
10.0000 mg | Freq: Once | INTRAVENOUS | Status: AC
  Administered 2016-08-30: 10 mg via INTRAVENOUS
  Filled 2016-08-30: qty 4

## 2016-08-30 MED ORDER — PROMETHAZINE HCL 25 MG/ML IJ SOLN
12.5000 mg | Freq: Four times a day (QID) | INTRAMUSCULAR | Status: DC | PRN
  Administered 2016-08-30: 12.5 mg via INTRAVENOUS
  Filled 2016-08-30: qty 1

## 2016-08-30 MED ORDER — IOPAMIDOL (ISOVUE-300) INJECTION 61%
INTRAVENOUS | Status: AC
  Administered 2016-08-31: 100 mL
  Filled 2016-08-30: qty 100

## 2016-08-30 MED ORDER — SODIUM CHLORIDE 0.9 % IV BOLUS (SEPSIS)
500.0000 mL | Freq: Once | INTRAVENOUS | Status: AC
  Administered 2016-08-30: 500 mL via INTRAVENOUS

## 2016-08-30 MED ORDER — PROMETHAZINE HCL 25 MG RE SUPP
25.0000 mg | Freq: Four times a day (QID) | RECTAL | Status: DC | PRN
  Filled 2016-08-30: qty 1

## 2016-08-30 MED ORDER — ONDANSETRON 4 MG PO TBDP
ORAL_TABLET | ORAL | Status: AC
  Filled 2016-08-30: qty 1

## 2016-08-30 NOTE — ED Notes (Signed)
Pt back in room.

## 2016-08-30 NOTE — ED Notes (Signed)
EDP at bedside  

## 2016-08-30 NOTE — ED Provider Notes (Signed)
[facility]-EMERGENCY DEPT Provider Note   CSN: [id number] Arrival date & time: 08/30/16  1800     History   Chief Complaint Chief Complaint  Patient presents with  . Nausea  . Emesis  . Dizziness    HPI AURTHUR WINGERTER is a 71 y.o. male.  HPI Patient presents with acute onset dizziness associated with nausea and 2 episodes of vomiting starting at 3 PM. It is worse with position change. States he has bilateral buzzing in his years. Denies headache or visual changes. Has difficulty characterizing dizziness. Denies any chest pain or shortness of breath. Denies abdominal pain. Endorses urinary frequency but denies hematuria or dysuria. No focal weakness or numbness.  Past Medical History:  Diagnosis Date  . CAD (coronary artery disease)    a. s/p MI in 2005 (symptom of "indigestion"); b. CABG x 3 in 3/09: L-LAD, S-OM, EF unknown  . Colon polyps   . Diabetes mellitus   . DJD (degenerative joint disease)   . HLD (hyperlipidemia)   . HTN (hypertension)   . MI (myocardial infarction) (HCC) 2005   manifested by indigestion  . Murmur    echo 6/12:  Upper septal thickening, no LVOT gradient,, EF 65%, mild LAE      Patient Active Problem List   Diagnosis Date Noted  . Hypertensive urgency 08/30/2016  . Abnormal stress test 10/25/2010  . Shortness of breath 10/18/2010  . Murmur 10/18/2010  . Tobacco abuse 10/18/2010  . COLONIC POLYPS, HX OF 03/21/2010  . HYPERLIPIDEMIA 10/13/2008  . HYPERTENSION, BENIGN 10/13/2008  . Coronary atherosclerosis of native coronary artery 10/13/2008  . CHEST PAIN-UNSPECIFIED 10/13/2008  . CHEST PAIN-PRECORDIAL 10/13/2008    Past Surgical History:  Procedure Laterality Date  . CHOLECYSTECTOMY  2002  . CORONARY ARTERY BYPASS GRAFT  2009   x3  . TONSILLECTOMY         Home Medications    Prior to Admission medications   Medication Sig Start Date End Date Taking? Authorizing Provider  aspirin 81 MG tablet Take 81 mg by mouth daily.       Historical Provider, MD  carvedilol (COREG) 25 MG tablet Take 25 mg by mouth 2 (two) times daily with a meal.      Historical Provider, MD  clopidogrel (PLAVIX) 75 MG tablet Take 75 mg by mouth daily.      Historical Provider, MD  isosorbide mononitrate (IMDUR) 30 MG 24 hr tablet Take 1 tablet (30 mg total) by mouth daily. 10/21/12 10/21/13  Bevelyn Buckles Bensimhon, MD  LISINOPRIL PO Take 5 mg by mouth daily.     Historical Provider, MD  metFORMIN (GLUCOPHAGE) 500 MG tablet Take one tablet in the morning and two tablets in the evening by mouth.    Historical Provider, MD  nitroGLYCERIN (NITROSTAT) 0.4 MG SL tablet Place 1 tablet (0.4 mg total) under the tongue every 5 (five) minutes as needed for chest pain. 10/25/10 10/25/11  Beatrice Lecher, PA-C  simvastatin (ZOCOR) 80 MG tablet Take 80 mg by mouth at bedtime.      Historical Provider, MD    Family History Family History  Problem Relation Age of Onset  . Colon cancer Mother   . Heart attack Mother     Social History Social History  Substance Use Topics  . Smoking status: Current Every Day Smoker    Types: Cigarettes  . Smokeless tobacco: Never Used     Comment: quit march 2010 after 20 yrs  . Alcohol use No  Allergies   Patient has no known allergies.   Review of Systems Review of Systems  Constitutional: Negative for chills and fever.  HENT: Positive for congestion, sinus pressure and tinnitus.   Eyes: Negative for visual disturbance.  Respiratory: Negative for cough and shortness of breath.   Cardiovascular: Negative for chest pain and leg swelling.  Gastrointestinal: Positive for nausea and vomiting. Negative for abdominal pain and constipation.  Genitourinary: Positive for frequency. Negative for dysuria, flank pain and hematuria.  Musculoskeletal: Negative for back pain, myalgias and neck pain.  Skin: Negative for rash and wound.  Neurological: Positive for dizziness and light-headedness. Negative for syncope, weakness,  numbness and headaches.  All other systems reviewed and are negative.    Physical Exam Updated Vital Signs BP (!) 179/105   Pulse (!) 58   Temp 98.4 F (36.9 C)   Resp 12   Ht  (1.803 m)   Wt 220 lb (99.8 kg)   SpO2 96%   BMI 30.68 kg/m   Physical Exam  Constitutional: He is oriented to person, place, and time. He appears well-developed and well-nourished. No distress.  HENT:  Head: Normocephalic and atraumatic.  Mouth/Throat: Oropharynx is clear and moist. No oropharyngeal exudate.  TMs are occluded by cerumen.  Eyes: EOM are normal. Pupils are equal, round, and reactive to light.  No nystagmus appreciated.  Neck: Normal range of motion. Neck supple. No JVD present.  Cardiovascular: Normal rate and regular rhythm.  Exam reveals no gallop and no friction rub.   No murmur heard. Pulmonary/Chest: Effort normal and breath sounds normal. No respiratory distress. He has no wheezes. He has no rales. He exhibits no tenderness.  Abdominal: Soft. Bowel sounds are normal. There is no tenderness. There is no rebound and no guarding.  Musculoskeletal: Normal range of motion. He exhibits no edema or tenderness.  No lower extremity swelling, asymmetry or tenderness.  Neurological: He is alert and oriented to person, place, and time.  5/5 motor in all extremities. Sensation fully intact. Normal right sided finger to nose testing. Slight impaired coordination with left sided finger to nose testing. Cranial nerves II through XII grossly intact. Sensation to light touch intact.  Skin: Skin is warm and dry. Capillary refill takes less than 2 seconds. No rash noted. No erythema.  Psychiatric: He has a normal mood and affect. His behavior is normal.  Nursing note and vitals reviewed.    ED Treatments / Results  Labs (all labs ordered are listed, but only abnormal results are displayed) Labs Reviewed  CBC WITH DIFFERENTIAL/PLATELET - Abnormal; Notable for the following:       Result  Value   WBC 11.9 (*)    All other components within normal limits  COMPREHENSIVE METABOLIC PANEL - Abnormal; Notable for the following:    CO2 20 (*)    Glucose, Bld 225 (*)    ALT 14 (*)    All other components within normal limits  URINALYSIS, ROUTINE W REFLEX MICROSCOPIC - Abnormal; Notable for the following:    Glucose, UA >=500 (*)    Hgb urine dipstick SMALL (*)    Ketones, ur 5 (*)    Protein, ur >=300 (*)    Squamous Epithelial / LPF 0-5 (*)    All other components within normal limits  I-STAT TROPOININ, ED    EKG  EKG Interpretation  Date/Time:  Wednesday August 30 2016 20:45:17 EDT Ventricular Rate:  62 PR Interval:    QRS Duration: 90 QT Interval:  459 QTC Calculation: 467 R Axis:   52 Text Interpretation:  Sinus rhythm Nonspecific T abnrm, anterolateral leads Confirmed by Ranae Palms  MD, Loza Prell ([id number]) on 08/30/2016 11:09:26 PM       Radiology Mr Brain Wo Contrast  Result Date: 08/30/2016 CLINICAL DATA:  Lightheadedness, nausea and vomiting EXAM: MRI HEAD WITHOUT CONTRAST TECHNIQUE: Multiplanar, multiecho pulse sequences of the brain and surrounding structures were obtained without intravenous contrast. COMPARISON:  None. FINDINGS: Brain: The midline structures are normal. No focal diffusion restriction to indicate acute infarct. No intraparenchymal hemorrhage. There is an old small right cerebellar infarct, as well as a chronic lacunar infarct in the right corona radiata. There is beginning confluent hyperintense T2-weighted signal within the periventricular white matter, most often seen in the setting of chronic microvascular ischemia. No mass lesion. Focus of chronic microhemorrhage in the right cerebellum. No hydrocephalus, age advanced atrophy or lobar predominant volume loss. No dural abnormality or extra-axial collection. Vascular: Major intracranial arterial and venous sinus flow voids are preserved. Skull and upper cervical spine: The visualized skull base,  calvarium, upper cervical spine and extracranial soft tissues are normal. Sinuses/Orbits: No fluid levels or advanced mucosal thickening. No mastoid effusion. Normal orbits. IMPRESSION: 1. Chronic microvascular ischemia without acute intracranial abnormality. 2. 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 Electronically Signed   By: Deatra Robinson M.D.   On: 08/30/2016 22:15    Procedures Procedures (including critical care time)  Medications Ordered in ED Medications  ondansetron (ZOFRAN-ODT) 4 MG disintegrating tablet (not administered)  ondansetron (ZOFRAN-ODT) disintegrating tablet 4 mg (4 mg Oral Given 08/30/16 1902)  sodium chloride 0.9 % bolus 500 mL (0 mLs Intravenous Stopped 08/30/16 2158)  meclizine (ANTIVERT) tablet 25 mg (25 mg Oral Given 08/30/16 2239)  labetalol (NORMODYNE,TRANDATE) injection 10 mg (10 mg Intravenous Given 08/30/16 2314)     Initial Impression / Assessment and Plan / ED Course  I have reviewed the triage vital signs and the nursing notes.  Pertinent labs & imaging results that were available during my care of the patient were reviewed by me and considered in my medical decision making (see chart for details).    MR demonstrates old strokes in the right cerebellum. No acute findings. Patient is still symptomatic despite meclizine. Unable to ambulate. Blood pressure initially controlled and then has increased. Given IV dose of labetalol. Given patient's ongoing symptoms, discussed with hospitalist. Will see patient in the emergency department.   Final Clinical Impressions(s) / ED Diagnoses   Final diagnoses:  Dizziness    New Prescriptions New Prescriptions   No medications on file     Loren Racer, MD 08/30/16 [phone]

## 2016-08-30 NOTE — ED Triage Notes (Addendum)
Pt st's earlier today he started to feel lightheaded then started having nausea and vomiting.  Denies any diarrhea or any pain.  Pt alert and oriented x's 3

## 2016-08-30 NOTE — ED Notes (Signed)
Pt ambulated with assistance from this EMT and Francesca Jewett. Pt able to stand, but after taking one step, pt noted to have very unsteady gait and c/o feeling imbalanced. Pt was unable to walk further and sat back on bed with assistance.

## 2016-08-30 NOTE — H&P (Signed)
History and Physical  Patient Name: Christopher Roberts     ZOX:[id number]    DOB: 11/30/1945    DOA: 08/30/2016 PCP: Enrique Sack, MD   Patient coming from: [place]  Chief Complaint: Dizziness  HPI: Christopher Roberts is a 71 y.o. male with a past medical history significant for CAD s/p CABG in 2009, HTN, NIDDM who presents with acute dizziness.  The patient has had a upper respiratory infection/congestion over the last few days, for which she's been taking Sudafed, but was otherwise in his normal health, adherent to home medicaitons. Then today he is taking his garbage cans of the street when he had sudden onset of lightheadedness, feeling "off balance", but was able to catch himself. He did not have vertigo or the sense that things were spinning around him or he was spinning, he had no focal numbness, focal weakness, slurred speech, confusion, loss of consciousness. The symptoms didn't pass and he vomited twice, so he eventually came to the emergency room.  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  Cardiovascular: Negative for chest pain.  Gastrointestinal: Positive for nausea and vomiting. Negative for abdominal pain.  Neurological: Positive for dizziness. Negative for tingling, sensory change, speech change, focal weakness, seizures, loss of consciousness and headaches.  All other systems reviewed and are negative.         Past Medical History:  Diagnosis Date  . CAD (coronary artery  disease)    a. s/p MI in 2005 (symptom of "indigestion"); b. CABG x 3 in 3/09: L-LAD, S-OM, EF unknown  . Colon polyps   . Diabetes mellitus   . DJD (degenerative joint disease)   . HLD (hyperlipidemia)   . HTN (hypertension)   . MI (myocardial infarction) (HCC) 2005   manifested by indigestion  . Murmur    echo 6/12:  Upper septal thickening, no LVOT gradient,, EF 65%, mild LAE      Past Surgical History:  Procedure Laterality Date  . CHOLECYSTECTOMY  2002  . CORONARY ARTERY BYPASS GRAFT  2009   x3  . TONSILLECTOMY      Social History: Patient lives with his wife and grandchildren.  The patient walks unassisted.  He still drives.  He is from [place], is a retired Designer, industrial/product.  He smokes, only a few cigarettes per day.    No Known Allergies  Family history: family history includes Colon cancer in his mother; Heart attack in his mother.  Prior to Admission medications   Medication Sig Start Date End Date Taking? Authorizing Provider  aspirin 81 MG tablet Take 81 mg by mouth daily.      Historical Provider, MD  carvedilol (COREG) 25 MG tablet Take 25 mg by mouth 2 (two) times daily with a meal.      Historical Provider, MD  clopidogrel (PLAVIX) 75 MG tablet Take 75 mg by mouth daily.      Historical Provider, MD  isosorbide mononitrate (IMDUR) 30 MG 24 hr tablet Take 1 tablet (  30 mg total) by mouth daily. 10/21/12 10/21/13  Bevelyn Buckles Bensimhon, MD  LISINOPRIL PO Take 5 mg by mouth daily.     Historical Provider, MD  metFORMIN (GLUCOPHAGE) 500 MG tablet Take one tablet in the morning and two tablets in the evening by mouth.    Historical Provider, MD  nitroGLYCERIN (NITROSTAT) 0.4 MG SL tablet Place 1 tablet (0.4 mg total) under the tongue every 5 (five) minutes as needed for chest pain. 10/25/10 10/25/11  Beatrice Lecher, PA-C  simvastatin (ZOCOR) 80 MG tablet Take 80 mg by mouth at bedtime.      Historical Provider, MD       Physical Exam: BP (!) 177/98   Pulse (!) 55    Temp 98.4 F (36.9 C)   Resp 14   Ht  (1.803 m)   Wt 99.8 kg (220 lb)   SpO2 94%   BMI 30.68 kg/m  General appearance: Well-developed, adult male, alert and in mild distress from dizziness, vertigo.   Eyes: Anicteric, conjunctiva pink, lids and lashes normal. PERRL.    ENT: No nasal deformity, discharge, epistaxis.  Hearing normal. OP moist without lesions.  Front teeth missing. Neck: No neck masses.  Trachea midline.  No thyromegaly/tenderness. Lymph: No cervical or supraclavicular lymphadenopathy. Skin: Warm and dry.   No suspicious rashes or lesions. Cardiac: RRR, nl S1-S2, no murmurs appreciated.  Capillary refill is brisk. Old sternotomy scar. JVP normal.  No LE edema.  Radial and DP pulses 2+ and symmetric. Respiratory: Normal respiratory rate and rhythm.  CTAB without rales or wheezes. Abdomen: Abdomen soft.  No TTP or guarding. No ascites, distension, hepatosplenomegaly.   MSK: No deformities or effusions.  No cyanosis or clubbing. Neuro: Cranial nerves 3-12 intact.  Mild rotatory nystagmus persists when looking to RIGHT.  FTN normal.  Sensation intact to light touch. Speech is fluent.  Muscle strength 5/5 and symmetric.  Mentation normal.    Psych: Sensorium intact and responding to questions, attention normal.  Behavior appropriate.  Affect normal.  Judgment and insight appear normal.     Labs on Admission:  I have personally reviewed following labs and imaging studies: CBC:  Recent Labs Lab 08/30/16 1900  WBC 11.9*  NEUTROABS 7.6  HGB 15.1  HCT 43.6  MCV 84.0  PLT 191   Basic Metabolic Panel:  Recent Labs Lab 08/30/16 1900  NA 136  K 4.1  CL 105  CO2 20*  GLUCOSE 225*  BUN 11  CREATININE 1.01  CALCIUM 9.3   GFR: Estimated Creatinine Clearance: 80.7 mL/min (by C-G formula based on SCr of 1.01 mg/dL).  Liver Function Tests:  Recent Labs Lab 08/30/16 1900  AST 18  ALT 14*  ALKPHOS 67  BILITOT 0.8  PROT 7.6  ALBUMIN 4.0   No results for  input(s): LIPASE, AMYLASE in the last 168 hours. No results for input(s): AMMONIA in the last 168 hours. Coagulation Profile: No results for input(s): INR, PROTIME in the last 168 hours. Cardiac Enzymes: No results for input(s): CKTOTAL, CKMB, CKMBINDEX, TROPONINI in the last 168 hours. BNP (last 3 results) No results for input(s): PROBNP in the last 8760 hours. HbA1C: No results for input(s): HGBA1C in the last 72 hours. CBG: No results for input(s): GLUCAP in the last 168 hours. Lipid Profile: No results for input(s): CHOL, HDL, LDLCALC, TRIG, CHOLHDL, LDLDIRECT in the last 72 hours. Thyroid Function Tests: No results for input(s): TSH, T4TOTAL, FREET4, T3FREE, THYROIDAB in the last 72 hours. Anemia  Panel: No results for input(s): VITAMINB12, FOLATE, FERRITIN, TIBC, IRON, RETICCTPCT in the last 72 hours. Sepsis Labs: Invalid input(s): PROCALCITONIN, LACTICIDVEN No results found for this or any previous visit (from the past 240 hour(s)).       Radiological Exams on Admission: Personally reviewed MR brain report: Mr Brain Wo Contrast  Result Date: 08/30/2016 CLINICAL DATA:  Lightheadedness, nausea and vomiting EXAM: MRI HEAD WITHOUT CONTRAST TECHNIQUE: Multiplanar, multiecho pulse sequences of the brain and surrounding structures were obtained without intravenous contrast. COMPARISON:  None. FINDINGS: Brain: The midline structures are normal. No focal diffusion restriction to indicate acute infarct. No intraparenchymal hemorrhage. There is an old small right cerebellar infarct, as well as a chronic lacunar infarct in the right corona radiata. There is beginning confluent hyperintense T2-weighted signal within the periventricular white matter, most often seen in the setting of chronic microvascular ischemia. No mass lesion. Focus of chronic microhemorrhage in the right cerebellum. No hydrocephalus, age advanced atrophy or lobar predominant volume loss. No dural abnormality or extra-axial  collection. Vascular: Major intracranial arterial and venous sinus flow voids are preserved. Skull and upper cervical spine: The visualized skull base, calvarium, upper cervical spine and extracranial soft tissues are normal. Sinuses/Orbits: No fluid levels or advanced mucosal thickening. No mastoid effusion. Normal orbits. IMPRESSION: 1. Chronic microvascular ischemia without acute intracranial abnormality. 2. 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 Electronically Signed   By: Deatra Robinson M.D.   On: 08/30/2016 22:15    EKG: Independently reviewed. Rate 62, QTc 467, nonspecific TW changes in precordial leads.    Assessment/Plan  1. Hypertensive urgency:  Adherent to medicines.  Vague about diet adherence.  Probably exacerbated by pseudoephedrine use.   -Carvedilol now and continue 25 BID -Additional lisinopril 10 now and then continue 20 daily -Continue Imdur -Cycle enzymes  -Smoking cessation recommended, modalities discussed, patient interested -Counseled about decongestants  2. Vertigo:  Suspect this is from #1 above (including recrudescence of old small cerebellar infarct?).  New infarct ruled out.  Primary peripheral vertigo possible. -Treat HTN  3. Type 2 diabetes:  -Hold metformin -SSI while in hospital  4. Coronary artery disease:  States this does not feel like previous MI. -Cycle enzymes -Continue Plavix, statin, baby aspirin  5. Bilious vomiting:  Vomiting presumed to be from vertigo, and without signs on exam.  However, given bright green color, will rule out obstruction. -CT abdomen and pelvis with contrast ordered      DVT prophylaxis: Lovenox  Code Status: FULL  Family Communication: Wife at bedside  Disposition Plan: Anticipate BP control and re-evaluate symptoms in AM.  If resolved with BP control, then d/c with counseling to avoid pseudophed.  Otherwise, consider vestibular rehab.  F/u CT abdomen. Consults called:  None Admission status: OBS At the point of initial evaluation, it is my clinical opinion that admission for OBSERVATION is reasonable and necessary because the patient's presenting complaints in the context of their chronic conditions represent sufficient risk of deterioration or significant morbidity to constitute reasonable grounds for close observation in the hospital setting, but that the patient may be medically stable for discharge from the hospital within 24 to 48 hours.    Medical decision making: Patient seen at 11:30 PM on 08/30/2016.  The patient was discussed with Dr. Ranae Palms.  What exists of the patient's chart was reviewed in depth and summarized above.  Clinical condition: stable.        Alberteen Sam [facility] Pager  [phone]  

## 2016-08-30 NOTE — ED Notes (Signed)
Pt in room vomiting. MD at bedside

## 2016-08-30 NOTE — ED Notes (Signed)
MD aware of vitals.

## 2016-08-30 NOTE — ED Notes (Signed)
Patient transported to MRI 

## 2016-08-31 ENCOUNTER — Observation Stay (HOSPITAL_COMMUNITY): Payer: Medicare Other

## 2016-08-31 ENCOUNTER — Encounter (HOSPITAL_COMMUNITY): Payer: Self-pay | Admitting: Radiology

## 2016-08-31 DIAGNOSIS — R42 Dizziness and giddiness: Secondary | ICD-10-CM | POA: Diagnosis not present

## 2016-08-31 DIAGNOSIS — I10 Essential (primary) hypertension: Secondary | ICD-10-CM | POA: Diagnosis not present

## 2016-08-31 DIAGNOSIS — E119 Type 2 diabetes mellitus without complications: Secondary | ICD-10-CM | POA: Diagnosis not present

## 2016-08-31 DIAGNOSIS — R112 Nausea with vomiting, unspecified: Secondary | ICD-10-CM | POA: Diagnosis not present

## 2016-08-31 DIAGNOSIS — R1110 Vomiting, unspecified: Secondary | ICD-10-CM | POA: Diagnosis not present

## 2016-08-31 DIAGNOSIS — I160 Hypertensive urgency: Secondary | ICD-10-CM | POA: Diagnosis not present

## 2016-08-31 LAB — GLUCOSE, CAPILLARY
GLUCOSE-CAPILLARY: 204 mg/dL — AB (ref 65–99)
GLUCOSE-CAPILLARY: 158 mg/dL — AB (ref 65–99)
GLUCOSE-CAPILLARY: 240 mg/dL — AB (ref 65–99)
GLUCOSE-CAPILLARY: 157 mg/dL — AB (ref 65–99)
Glucose-Capillary: 207 mg/dL — ABNORMAL HIGH (ref 65–99)

## 2016-08-31 LAB — COMPREHENSIVE METABOLIC PANEL
ALT: 13 U/L — AB (ref 17–63)
AST: 16 U/L (ref 15–41)
Albumin: 3.5 g/dL (ref 3.5–5.0)
Alkaline Phosphatase: 58 U/L (ref 38–126)
Anion gap: 9 (ref 5–15)
BILIRUBIN TOTAL: 0.6 mg/dL (ref 0.3–1.2)
BUN: 10 mg/dL (ref 6–20)
CHLORIDE: 106 mmol/L (ref 101–111)
CO2: 22 mmol/L (ref 22–32)
Calcium: 8.6 mg/dL — ABNORMAL LOW (ref 8.9–10.3)
Creatinine, Ser: 0.93 mg/dL (ref 0.61–1.24)
GFR calc Af Amer: >60 mL/min (ref >60)
GFR calc non Af Amer: >60 mL/min (ref >60)
GLUCOSE: 172 mg/dL — AB (ref 65–99)
POTASSIUM: 3.8 mmol/L (ref 3.5–5.1)
SODIUM: 137 mmol/L (ref 135–145)
Total Protein: 6.5 g/dL (ref 6.5–8.1)

## 2016-08-31 LAB — CBC
HCT: 41.4 % (ref 39.0–52.0)
Hemoglobin: 14.3 g/dL (ref 13.0–17.0)
MCH: 29.1 pg (ref 26.0–34.0)
MCHC: 34.5 g/dL (ref 30.0–36.0)
MCV: 84.1 fL (ref 78.0–100.0)
Platelets: 177 10*3/uL (ref 150–400)
RBC: 4.92 MIL/uL (ref 4.22–5.81)
RDW: 13.3 % (ref 11.5–15.5)
WBC: 8.5 10*3/uL (ref 4.0–10.5)

## 2016-08-31 LAB — TROPONIN I: Troponin I: <0.03 ng/mL (ref <0.03)

## 2016-08-31 MED ORDER — LORATADINE 10 MG PO TABS: 10.0000 mg | ORAL_TABLET | Freq: Every day | ORAL | 0 refills | Status: DC

## 2016-08-31 MED ORDER — ONDANSETRON HCL 4 MG/2ML IJ SOLN: 4.0000 mg | Freq: Four times a day (QID) | INTRAMUSCULAR | Status: DC | PRN

## 2016-08-31 MED ORDER — CLOPIDOGREL BISULFATE 75 MG PO TABS
75.0000 mg | ORAL_TABLET | Freq: Every day | ORAL | Status: DC
  Administered 2016-08-31 – 2016-09-02 (×3): 75 mg via ORAL
  Filled 2016-08-31 (×3): qty 1

## 2016-08-31 MED ORDER — METFORMIN HCL 500 MG PO TABS
500.0000 mg | ORAL_TABLET | Freq: Every day | ORAL | Status: DC
  Administered 2016-08-31 – 2016-09-02 (×3): 500 mg via ORAL
  Filled 2016-08-31 (×3): qty 1

## 2016-08-31 MED ORDER — AMLODIPINE BESYLATE 10 MG PO TABS: 10.0000 mg | ORAL_TABLET | Freq: Every day | ORAL | 0 refills | Status: DC

## 2016-08-31 MED ORDER — ASPIRIN EC 81 MG PO TBEC
81.0000 mg | DELAYED_RELEASE_TABLET | Freq: Every day | ORAL | Status: DC
  Administered 2016-08-31 – 2016-09-02 (×3): 81 mg via ORAL
  Filled 2016-08-31 (×3): qty 1

## 2016-08-31 MED ORDER — SODIUM CHLORIDE 0.9 % IV SOLN
INTRAVENOUS | Status: DC
  Administered 2016-08-31: 02:00:00 via INTRAVENOUS

## 2016-08-31 MED ORDER — INSULIN ASPART 100 UNIT/ML ~~LOC~~ SOLN
0.0000 [IU] | Freq: Every day | SUBCUTANEOUS | Status: DC
  Administered 2016-08-31: 2 [IU] via SUBCUTANEOUS

## 2016-08-31 MED ORDER — LISINOPRIL 20 MG PO TABS
20.0000 mg | ORAL_TABLET | Freq: Every day | ORAL | Status: DC
  Administered 2016-08-31 – 2016-09-02 (×3): 20 mg via ORAL
  Filled 2016-08-31 (×3): qty 1

## 2016-08-31 MED ORDER — OXYMETAZOLINE HCL 0.05 % NA SOLN
1.0000 | Freq: Two times a day (BID) | NASAL | Status: DC
  Administered 2016-08-31 – 2016-09-01 (×3): 1 via NASAL
  Filled 2016-08-31: qty 15

## 2016-08-31 MED ORDER — CARVEDILOL 25 MG PO TABS
25.0000 mg | ORAL_TABLET | Freq: Two times a day (BID) | ORAL | Status: DC
  Administered 2016-08-31 – 2016-09-02 (×6): 25 mg via ORAL
  Filled 2016-08-31 (×6): qty 1

## 2016-08-31 MED ORDER — INSULIN ASPART 100 UNIT/ML ~~LOC~~ SOLN
0.0000 [IU] | Freq: Three times a day (TID) | SUBCUTANEOUS | Status: DC
  Administered 2016-08-31 – 2016-09-01 (×4): 3 [IU] via SUBCUTANEOUS
  Administered 2016-09-01: 1 [IU] via SUBCUTANEOUS
  Administered 2016-09-02: 3 [IU] via SUBCUTANEOUS
  Administered 2016-09-02: 2 [IU] via SUBCUTANEOUS

## 2016-08-31 MED ORDER — AMLODIPINE BESYLATE 5 MG PO TABS
5.0000 mg | ORAL_TABLET | Freq: Every day | ORAL | Status: DC
  Administered 2016-08-31 – 2016-09-02 (×3): 5 mg via ORAL
  Filled 2016-08-31 (×3): qty 1

## 2016-08-31 MED ORDER — ENOXAPARIN SODIUM 40 MG/0.4ML ~~LOC~~ SOLN
40.0000 mg | SUBCUTANEOUS | Status: DC
  Administered 2016-08-31 – 2016-09-02 (×3): 40 mg via SUBCUTANEOUS
  Filled 2016-08-31 (×3): qty 0.4

## 2016-08-31 MED ORDER — SODIUM CHLORIDE 0.9% FLUSH
3.0000 mL | Freq: Two times a day (BID) | INTRAVENOUS | Status: DC
  Administered 2016-08-31 – 2016-09-02 (×6): 3 mL via INTRAVENOUS

## 2016-08-31 MED ORDER — METFORMIN HCL 500 MG PO TABS
1000.0000 mg | ORAL_TABLET | Freq: Every day | ORAL | Status: DC
  Administered 2016-08-31 – 2016-09-01 (×2): 1000 mg via ORAL
  Filled 2016-08-31 (×2): qty 2

## 2016-08-31 MED ORDER — ONDANSETRON HCL 4 MG PO TABS: 4.0000 mg | ORAL_TABLET | Freq: Four times a day (QID) | ORAL | Status: DC | PRN

## 2016-08-31 MED ORDER — ISOSORBIDE MONONITRATE ER 30 MG PO TB24
30.0000 mg | ORAL_TABLET | Freq: Every day | ORAL | Status: DC
  Administered 2016-08-31 – 2016-09-02 (×3): 30 mg via ORAL
  Filled 2016-08-31 (×3): qty 1

## 2016-08-31 MED ORDER — ATORVASTATIN CALCIUM 20 MG PO TABS
20.0000 mg | ORAL_TABLET | Freq: Every day | ORAL | Status: DC
  Administered 2016-09-01: 20 mg via ORAL
  Filled 2016-08-31: qty 1

## 2016-08-31 MED ORDER — SIMVASTATIN 40 MG PO TABS
40.0000 mg | ORAL_TABLET | Freq: Every day | ORAL | Status: DC
  Administered 2016-08-31: 40 mg via ORAL
  Filled 2016-08-31: qty 1

## 2016-08-31 MED ORDER — LORATADINE 10 MG PO TABS
10.0000 mg | ORAL_TABLET | Freq: Every day | ORAL | Status: DC
  Administered 2016-08-31 – 2016-09-02 (×3): 10 mg via ORAL
  Filled 2016-08-31 (×3): qty 1

## 2016-08-31 MED ORDER — LISINOPRIL 10 MG PO TABS
10.0000 mg | ORAL_TABLET | Freq: Once | ORAL | Status: AC
  Administered 2016-08-31: 10 mg via ORAL
  Filled 2016-08-31: qty 1

## 2016-08-31 NOTE — Progress Notes (Signed)
PROGRESS NOTE    EQUAN COGBILL  ZOX:[id number] DOB: 06-Dec-1945 DOA: 08/30/2016 PCP: Enrique Sack, MD   Outpatient Specialists:     Brief Narrative:  Christopher Roberts is a 71 y.o. male with a past medical history significant for CAD s/p CABG in 2009, HTN, NIDDM who presents with acute dizziness.  The patient has had a upper respiratory infection/congestion over the last few days, for which she's been taking Sudafed, but was otherwise in his normal health, adherent to home medicaitons. Then today he is taking his garbage cans of the street when he had sudden onset of lightheadedness, feeling "off balance", but was able to catch himself. He did not have vertigo or the sense that things were spinning around him or he was spinning, he had no focal numbness, focal weakness, slurred speech, confusion, loss of consciousness. The symptoms didn't pass and he vomited twice, so he eventually came to the emergency room.    Assessment & Plan:   Principal Problem:   Hypertensive urgency Active Problems:   Essential hypertension   Coronary artery disease due to lipid rich plaque   Vomiting   Type 2 diabetes mellitus without complication, without long-term current use of insulin (HCC)   Hypertensive urgency:  Adherent to medicines.  Vague about diet adherence.  Probably exacerbated by pseudoephedrine use.   -Carvedilol  25 BID -lisinopril 20 daily -Continue Imdur -add norvasc -Smoking cessation recommended -Counseled about not using decongestants-- added claratin and afrin x 3 days for suspected allergies  Vertigo:  Suspected from high BP but BP better and patient still have feelings of dizziness/ataxia  Primary peripheral vertigo possible. -Treat HTN and vestibular PT consult   Type 2 diabetes:  -resume metformin -SSI while in hospital  Coronary artery disease:  CE: negative -Continue Plavix, statin, baby aspirin   Bilious vomiting:  Vomiting presumed to be from vertigo, and  without signs on exam. -CT scan done in ER of abd negative   DVT prophylaxis:  Lovenox  Code Status: Full Code   Family Communication: Wife at bedside  Disposition Plan:     Consultants:      Subjective: Still with "dizziness" when sitting up  Objective: Vitals:   08/31/16 0136 08/31/16 0212 08/31/16 0250 08/31/16 0541  BP: (!) 208/114  (!) 140/97 (!) 144/102  Pulse: 64 67 65 63  Resp: Temp: 97.8 F (36.6 C)   97.4 F (36.3 C)  TempSrc: Oral   Oral  SpO2: 99% 100% 91% 99%  Weight: 98.8 kg (217 lb 12.8 oz)     Height:  (1.778 m)       Intake/Output Summary (Last 24 hours) at 08/31/16 1302 Last data filed at 08/31/16 1125  Gross per 24 hour  Intake          1011.67 ml  Output             1000 ml  Net            11.67 ml   Filed Weights   08/30/16 1853 08/31/16 0136  Weight: 99.8 kg (220 lb) 98.8 kg (217 lb 12.8 oz)    Examination:  General exam: Appears calm and comfortable  Respiratory system: Clear to auscultation. Respiratory effort normal. Cardiovascular system: S1 & S2 heard, RRR. No JVD, murmurs, rubs, gallops or clicks. No pedal edema. Gastrointestinal system: Abdomen is nondistended, soft and nontender. No organomegaly or masses felt. Normal bowel sounds heard. Central nervous system: Alert and  oriented     Data Reviewed: I have personally reviewed following labs and imaging studies  CBC:  Recent Labs Lab 08/30/16 1900 08/31/16 0443  WBC 11.9* 8.5  NEUTROABS 7.6  --   HGB 15.1 14.3  HCT 43.6 41.4  MCV 84.0 84.1  PLT 191 177   Basic Metabolic Panel:  Recent Labs Lab 08/30/16 1900 08/31/16 0443  NA 136 137  K 4.1 3.8  CL 105 106  CO2 20* 22  GLUCOSE 225* 172*  BUN 11 10  CREATININE 1.01 0.93  CALCIUM 9.3 8.6*   GFR: Estimated Creatinine Clearance: 85.8 mL/min (by C-G formula based on SCr of 0.93 mg/dL). Liver Function Tests:  Recent Labs Lab 08/30/16 1900 08/31/16 0443  AST 18 16  ALT 14*  13*  ALKPHOS 67 58  BILITOT 0.8 0.6  PROT 7.6 6.5  ALBUMIN 4.0 3.5   No results for input(s): LIPASE, AMYLASE in the last 168 hours. No results for input(s): AMMONIA in the last 168 hours. Coagulation Profile: No results for input(s): INR, PROTIME in the last 168 hours. Cardiac Enzymes:  Recent Labs Lab 08/31/16 0443  TROPONINI <0.03   BNP (last 3 results) No results for input(s): PROBNP in the last 8760 hours. HbA1C: No results for input(s): HGBA1C in the last 72 hours. CBG:  Recent Labs Lab 08/31/16 0143 08/31/16 0738 08/31/16 1118  GLUCAP 204* 158* 207*   Lipid Profile: No results for input(s): CHOL, HDL, LDLCALC, TRIG, CHOLHDL, LDLDIRECT in the last 72 hours. Thyroid Function Tests: No results for input(s): TSH, T4TOTAL, FREET4, T3FREE, THYROIDAB in the last 72 hours. Anemia Panel: No results for input(s): VITAMINB12, FOLATE, FERRITIN, TIBC, IRON, RETICCTPCT in the last 72 hours. Urine analysis:    Component Value Date/Time   COLORURINE YELLOW 08/30/2016 1009   APPEARANCEUR CLEAR 08/30/2016 1009   LABSPEC 1.017 08/30/2016 1009   PHURINE 5.0 08/30/2016 1009   GLUCOSEU >=500 (A) 08/30/2016 1009   HGBUR SMALL (A) 08/30/2016 1009   BILIRUBINUR NEGATIVE 08/30/2016 1009   KETONESUR 5 (A) 08/30/2016 1009   PROTEINUR >=300 (A) 08/30/2016 1009   NITRITE NEGATIVE 08/30/2016 1009   LEUKOCYTESUR NEGATIVE 08/30/2016 1009     )No results found for this or any previous visit (from the past 240 hour(s)).    Anti-infectives    None       Radiology Studies: Ct Head Wo Contrast  Result Date: 08/31/2016 CLINICAL DATA:  Acute onset of dizziness and lightheadedness. Initial encounter. EXAM: CT HEAD WITHOUT CONTRAST TECHNIQUE: Contiguous axial images were obtained from the base of the skull through the vertex without intravenous contrast. COMPARISON:  MRI of the brain performed 08/30/2016 FINDINGS: Brain: No evidence of acute infarction, hemorrhage, hydrocephalus,  extra-axial collection or mass lesion/mass effect. Scattered periventricular and subcortical white matter change likely reflects small vessel ischemic microangiopathy. Mild cerebellar atrophy is noted. Chronic lacunar infarcts are noted at the cerebellar hemispheres bilaterally. Dense calcification is seen at the basal ganglia bilaterally. A small chronic infarct is noted at the left temporal lobe. The brainstem and fourth ventricle are within normal limits. The third and lateral ventricles are unremarkable in appearance. No mass effect or midline shift is seen. Vascular: No hyperdense vessel or unexpected calcification. Skull: There is no evidence of fracture; visualized osseous structures are unremarkable in appearance. Sinuses/Orbits: The orbits are within normal limits. Mucosal thickening is noted at the right maxillary sinus. The remaining paranasal sinuses and mastoid air cells are well-aerated. Other: No significant soft tissue abnormalities are seen.  IMPRESSION: 1. No acute intracranial pathology seen on CT. 2. Scattered small vessel ischemic microangiopathy. 3. 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 4. Mucosal thickening at the right maxillary sinus. Electronically Signed   By: Roanna Raider M.D.   On: 08/31/2016 01:21   Mr Brain Wo Contrast  Result Date: 08/30/2016 CLINICAL DATA:  Lightheadedness, nausea and vomiting EXAM: MRI HEAD WITHOUT CONTRAST TECHNIQUE: Multiplanar, multiecho pulse sequences of the brain and surrounding structures were obtained without intravenous contrast. COMPARISON:  None. FINDINGS: Brain: The midline structures are normal. No focal diffusion restriction to indicate acute infarct. No intraparenchymal hemorrhage. There is an old small right cerebellar infarct, as well as a chronic lacunar infarct in the right corona radiata. There is beginning confluent hyperintense T2-weighted signal within the  periventricular white matter, most often seen in the setting of chronic microvascular ischemia. No mass lesion. Focus of chronic microhemorrhage in the right cerebellum. No hydrocephalus, age advanced atrophy or lobar predominant volume loss. No dural abnormality or extra-axial collection. Vascular: Major intracranial arterial and venous sinus flow voids are preserved. Skull and upper cervical spine: The visualized skull base, calvarium, upper cervical spine and extracranial soft tissues are normal. Sinuses/Orbits: No fluid levels or advanced mucosal thickening. No mastoid effusion. Normal orbits. IMPRESSION: 1. Chronic microvascular ischemia without acute intracranial abnormality. 2. 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 Electronically Signed   By: Deatra Robinson M.D.   On: 08/30/2016 22:15   Ct Abdomen Pelvis W Contrast  Result Date: 08/31/2016 CLINICAL DATA:  Nausea and vomiting EXAM: CT ABDOMEN AND PELVIS WITH CONTRAST TECHNIQUE: Multidetector CT imaging of the abdomen and pelvis was performed using the standard protocol following bolus administration of intravenous contrast. CONTRAST:  100 mL ISOVUE-300 IOPAMIDOL (ISOVUE-300) INJECTION 61% COMPARISON:  None. FINDINGS: Lower chest: No pulmonary nodules. No visible pleural or pericardial effusion. Hepatobiliary: Normal hepatic size and contours without focal liver lesion. No perihepatic ascites. No intra- or extrahepatic biliary dilatation. Status post cholecystectomy. Pancreas: Normal pancreatic contours and enhancement. No peripancreatic fluid collection or pancreatic ductal dilatation. Spleen: Normal. Adrenals/Urinary Tract: Normal adrenal glands. No hydronephrosis or solid renal mass. 1.8 cm renal cyst at the right lower pole Stomach/Bowel: There is a small hiatal hernia. The stomach and duodenum are normal. There is no small bowel obstruction or evidence of inflammation. There is redundant sigmoid colon with diverticulosis  but no acute inflammation. No focal colonic abnormality. The appendix is normal. Vascular/Lymphatic: There is atherosclerotic calcification of the non aneurysmal abdominal aorta. No abdominal or pelvic adenopathy. Reproductive: Normal prostate and seminal vesicles. Musculoskeletal: Bridging osteophyte at the right sacroiliac joint. Multilevel lumbar facet arthrosis and osteophytosis without bony spinal canal stenosis. Normal visualized extrathoracic and extraperitoneal soft tissues. Other: Delayed phase imaging of the kidneys and ureters is unremarkable. IMPRESSION: 1. No acute abnormality of the abdomen or pelvis. 2. Aortic atherosclerosis. Electronically Signed   By: Deatra Robinson M.D.   On: 08/31/2016 01:23        Scheduled Meds: . amLODipine  5 mg Oral Daily  . aspirin EC  81 mg Oral Daily  . carvedilol  25 mg Oral BID WC  . clopidogrel  75 mg Oral Daily  . enoxaparin (LOVENOX) injection  40 mg Subcutaneous Q24H  . insulin aspart  0-5 Units Subcutaneous QHS  . insulin aspart  0-9 Units Subcutaneous TID WC  . isosorbide mononitrate  30 mg Oral Daily  .  lisinopril  20 mg Oral Daily  . loratadine  10 mg Oral Daily  . metFORMIN  1,000 mg Oral Q supper  . metFORMIN  500 mg Oral Q breakfast  . oxymetazoline  1 spray Each Nare BID  . simvastatin  40 mg Oral q1800  . sodium chloride flush  3 mL Intravenous Q12H   Continuous Infusions:   LOS: 0 days    Time spent: 25 min    Ollie Delano U Jodeci Roarty, DO [facility] Pager [phone]  If 7PM-7AM, please contact night-coverage [email or web address] Password [email or web address] 08/31/2016, 1:02 PM

## 2016-08-31 NOTE — Care Management Obs Status (Signed)
MEDICARE OBSERVATION STATUS NOTIFICATION   Patient Details  Name: Christopher Roberts MRN: [id number] Date of Birth: 1946-04-11   Medicare Observation Status Notification Given:  Yes    Gala Lewandowsky, RN 08/31/2016, 4:43 PM

## 2016-09-01 ENCOUNTER — Observation Stay (HOSPITAL_COMMUNITY): Payer: Medicare Other

## 2016-09-01 DIAGNOSIS — R42 Dizziness and giddiness: Secondary | ICD-10-CM

## 2016-09-01 DIAGNOSIS — I160 Hypertensive urgency: Secondary | ICD-10-CM | POA: Diagnosis not present

## 2016-09-01 DIAGNOSIS — I10 Essential (primary) hypertension: Secondary | ICD-10-CM | POA: Diagnosis not present

## 2016-09-01 DIAGNOSIS — E119 Type 2 diabetes mellitus without complications: Secondary | ICD-10-CM | POA: Diagnosis not present

## 2016-09-01 DIAGNOSIS — I6523 Occlusion and stenosis of bilateral carotid arteries: Secondary | ICD-10-CM | POA: Diagnosis not present

## 2016-09-01 LAB — GLUCOSE, CAPILLARY
GLUCOSE-CAPILLARY: 218 mg/dL — AB (ref 65–99)
Glucose-Capillary: 148 mg/dL — ABNORMAL HIGH (ref 65–99)
Glucose-Capillary: 237 mg/dL — ABNORMAL HIGH (ref 65–99)
Glucose-Capillary: 134 mg/dL — ABNORMAL HIGH (ref 65–99)

## 2016-09-01 MED ORDER — MECLIZINE HCL 25 MG PO TABS
25.0000 mg | ORAL_TABLET | Freq: Three times a day (TID) | ORAL | Status: DC
  Administered 2016-09-01: 25 mg via ORAL
  Filled 2016-09-01: qty 1

## 2016-09-01 MED ORDER — GADOBENATE DIMEGLUMINE 529 MG/ML IV SOLN: 20.0000 mL | Freq: Once | INTRAVENOUS | Status: DC | PRN

## 2016-09-01 MED ORDER — DIAZEPAM 2 MG PO TABS: 2.0000 mg | ORAL_TABLET | Freq: Two times a day (BID) | ORAL | Status: DC | PRN

## 2016-09-01 MED ORDER — PREDNISONE 20 MG PO TABS
50.0000 mg | ORAL_TABLET | Freq: Every day | ORAL | Status: DC
  Administered 2016-09-02: 50 mg via ORAL
  Filled 2016-09-01: qty 2
  Filled 2016-09-01: qty 1

## 2016-09-01 NOTE — Progress Notes (Signed)
PROGRESS NOTE    Christopher Roberts  ZOX:[id number] DOB: 10-Feb-1946 DOA: 08/30/2016 PCP: Enrique Sack, MD    Brief Narrative: Christopher Roberts is a 71 year old male with a medical history significant for hypertension, hyperlipidemia, diabetes mellitus type II, CAB s/p CABG in 2009, past MI, and tobacco use who presented to the emergency department for a sudden onset of dizziness worse with position changes, feeling off balance, lightheadedness, and vomiting upon taking the trash out. He has a recent URI and was taking Sudafed and upon arrival in the ED, patient's BP was 239/114. 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 Patient was given Meclizine/Zofran, fluids, and Labetalol but due to persistent vomiting and instability, TR[facility] was consulted and patient was admitted.   Subjective: Patient reports dizziness upon waking up this morning and whenever he gets up. Denies vomiting, chest pain, weakness, difficulty breathing.   Assessment & Plan:   Principal Problem:   Hypertensive urgency Active Problems:   Essential hypertension   Coronary artery disease due to lipid rich plaque   Vomiting   Type 2 diabetes mellitus without complication, without long-term current use of insulin (HCC)   Hypertensive urgency - Patient with a history of hypertension recently taking Sudafed for a URI presented to the ED on 4/26 with a BP 239/114. Negative orthostatic vitals. He was symptomatic with vomiting and lightheadedness and given IV Labetalol 10 mg and admitted. - BP well controlled at 136/77 on Norvasc 5 mg, Coreg 25 mg, Lisinopril 20 mg. Continue these medications - Last ECHO 10/2010 showed preserved EF 65% and no regional wall motion abnormalities. - Will order ECHO and carotid doppler ultrasound to assess for embolic risk - Hold Afrin spray to avoid hypertension stimulants   Dizziness and vomiting - Patient was symptomatically vomiting in ED yesterday and was given  zofran and meclizine for dizziness.  - CT abdomen was negative for any acute abnormality and no evidence of aortic aneurysm.  - MR brain showed chronic microvascular ischemia without acute intracranial abnormality. 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 - Patient reports still feeling dizzy as he sits up this morning. Order ECHO and carotid US given patient's cardiovascular risk factors for embolic events - Continue antiemetics prn - Appreciate neurology consult  Hypertension - Continue Norvasc, Coreg, Lisinopril  Type 2 diabetes mellitus - Continue SSI, metformin  Coronary artery disease s/p CABG 2009 - Continue aspirin, Lipitor, Plavix, imdur   DVT prophylaxis: Lovenox Code Status: Full Family Communication: None at bedside Disposition Plan: Home  Consultants:   Neurology  Procedures:   ECHO 4/27   Antimicrobials:  None  Objective: Vitals:   08/31/16 1435 08/31/16 1900 08/31/16 2100 09/01/16 0439  BP: (!) 157/96 126/75  136/77  Pulse: 66 68 67 62  Resp: Temp: 98.1 F (36.7 C) 97.8 F (36.6 C)  98.2 F (36.8 C)  TempSrc: Oral Oral  Oral  SpO2: 100% 100% 99% 97%  Weight:    99.9 kg (220 lb 3.2 oz)  Height:     (1.778 m)    Intake/Output Summary (Last 24 hours) at 09/01/16 1029 Last data filed at 08/31/16 2236  Gross per 24 hour  Intake              123 ml  Output  975 ml  Net             -852 ml   Filed Weights   08/30/16 1853 08/31/16 0136 09/01/16 0439  Weight: 99.8 kg (220 lb) 98.8 kg (217 lb 12.8 oz) 99.9 kg (220 lb 3.2 oz)    Examination:  General exam: Appears calm and comfortable  Respiratory system: Clear to auscultation. Respiratory effort normal. Cardiovascular system: S1 & S2 heard, RRR. No JVD, murmurs, rubs, gallops or clicks. No pedal edema. Gastrointestinal system: Abdomen is nondistended, soft and  nontender. Normal bowel sounds heard. Central nervous system: Alert and oriented. No focal neurological deficits. Extremities: Symmetric 5 x 5 power. Skin: No rashes, lesions or ulcers Psychiatry: Judgement and insight appear normal. Mood & affect appropriate.   Data Reviewed: I have personally reviewed following labs and imaging studies  CBC:  Recent Labs Lab 08/30/16 1900 08/31/16 0443  WBC 11.9* 8.5  NEUTROABS 7.6  --   HGB 15.1 14.3  HCT 43.6 41.4  MCV 84.0 84.1  PLT 191 177   Basic Metabolic Panel:  Recent Labs Lab 08/30/16 1900 08/31/16 0443  NA 136 137  K 4.1 3.8  CL 105 106  CO2 20* 22  GLUCOSE 225* 172*  BUN 11 10  CREATININE 1.01 0.93  CALCIUM 9.3 8.6*   GFR: Estimated Creatinine Clearance: 86.4 mL/min (by C-G formula based on SCr of 0.93 mg/dL). Liver Function Tests:  Recent Labs Lab 08/30/16 1900 08/31/16 0443  AST 18 16  ALT 14* 13*  ALKPHOS 67 58  BILITOT 0.8 0.6  PROT 7.6 6.5  ALBUMIN 4.0 3.5   No results for input(s): LIPASE, AMYLASE in the last 168 hours. No results for input(s): AMMONIA in the last 168 hours. Coagulation Profile: No results for input(s): INR, PROTIME in the last 168 hours. Cardiac Enzymes:  Recent Labs Lab 08/31/16 0443  TROPONINI <0.03   BNP (last 3 results) No results for input(s): PROBNP in the last 8760 hours. HbA1C: No results for input(s): HGBA1C in the last 72 hours. CBG:  Recent Labs Lab 08/31/16 0738 08/31/16 1118 08/31/16 1633 08/31/16 2116 09/01/16 0719  GLUCAP 158* 207* 240* 157* 148*   Lipid Profile: No results for input(s): CHOL, HDL, LDLCALC, TRIG, CHOLHDL, LDLDIRECT in the last 72 hours. Thyroid Function Tests: No results for input(s): TSH, T4TOTAL, FREET4, T3FREE, THYROIDAB in the last 72 hours. Anemia Panel: No results for input(s): VITAMINB12, FOLATE, FERRITIN, TIBC, IRON, RETICCTPCT in the last 72 hours. Sepsis Labs: No results for input(s): PROCALCITON, LATICACIDVEN in the last  168 hours.  No results found for this or any previous visit (from the past 240 hour(s)).       Radiology Studies: Ct Head Wo Contrast  Result Date: 08/31/2016 CLINICAL DATA:  Acute onset of dizziness and lightheadedness. Initial encounter. EXAM: CT HEAD WITHOUT CONTRAST TECHNIQUE: Contiguous axial images were obtained from the base of the skull through the vertex without intravenous contrast. COMPARISON:  MRI of the brain performed 08/30/2016 FINDINGS: Brain: No evidence of acute infarction, hemorrhage, hydrocephalus, extra-axial collection or mass lesion/mass effect. Scattered periventricular and subcortical white matter change likely reflects small vessel ischemic microangiopathy. Mild cerebellar atrophy is noted. Chronic lacunar infarcts are noted at the cerebellar hemispheres bilaterally. Dense calcification is seen at the basal ganglia bilaterally. A small chronic infarct is noted at the left temporal lobe. The brainstem and fourth ventricle are within normal limits. The third and lateral ventricles are unremarkable in appearance. No mass effect or midline shift is  seen. Vascular: No hyperdense vessel or unexpected calcification. Skull: There is no evidence of fracture; visualized osseous structures are unremarkable in appearance. Sinuses/Orbits: The orbits are within normal limits. Mucosal thickening is noted at the right maxillary sinus. The remaining paranasal sinuses and mastoid air cells are well-aerated. Other: No significant soft tissue abnormalities are seen. IMPRESSION: 1. No acute intracranial pathology seen on CT. 2. Scattered small vessel ischemic microangiopathy. 3. 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 4. Mucosal thickening at the right maxillary sinus. Electronically Signed   By: Roanna Raider M.D.   On: 08/31/2016 01:21   Mr Brain Wo Contrast  Result Date: 08/30/2016 CLINICAL DATA:   Lightheadedness, nausea and vomiting EXAM: MRI HEAD WITHOUT CONTRAST TECHNIQUE: Multiplanar, multiecho pulse sequences of the brain and surrounding structures were obtained without intravenous contrast. COMPARISON:  None. FINDINGS: Brain: The midline structures are normal. No focal diffusion restriction to indicate acute infarct. No intraparenchymal hemorrhage. There is an old small right cerebellar infarct, as well as a chronic lacunar infarct in the right corona radiata. There is beginning confluent hyperintense T2-weighted signal within the periventricular white matter, most often seen in the setting of chronic microvascular ischemia. No mass lesion. Focus of chronic microhemorrhage in the right cerebellum. No hydrocephalus, age advanced atrophy or lobar predominant volume loss. No dural abnormality or extra-axial collection. Vascular: Major intracranial arterial and venous sinus flow voids are preserved. Skull and upper cervical spine: The visualized skull base, calvarium, upper cervical spine and extracranial soft tissues are normal. Sinuses/Orbits: No fluid levels or advanced mucosal thickening. No mastoid effusion. Normal orbits. IMPRESSION: 1. Chronic microvascular ischemia without acute intracranial abnormality. 2. 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 Electronically Signed   By: Deatra Robinson M.D.   On: 08/30/2016 22:15   Ct Abdomen Pelvis W Contrast  Result Date: 08/31/2016 CLINICAL DATA:  Nausea and vomiting EXAM: CT ABDOMEN AND PELVIS WITH CONTRAST TECHNIQUE: Multidetector CT imaging of the abdomen and pelvis was performed using the standard protocol following bolus administration of intravenous contrast. CONTRAST:  100 mL ISOVUE-300 IOPAMIDOL (ISOVUE-300) INJECTION 61% COMPARISON:  None. FINDINGS: Lower chest: No pulmonary nodules. No visible pleural or pericardial effusion. Hepatobiliary: Normal hepatic size and contours without focal liver lesion. No perihepatic  ascites. No intra- or extrahepatic biliary dilatation. Status post cholecystectomy. Pancreas: Normal pancreatic contours and enhancement. No peripancreatic fluid collection or pancreatic ductal dilatation. Spleen: Normal. Adrenals/Urinary Tract: Normal adrenal glands. No hydronephrosis or solid renal mass. 1.8 cm renal cyst at the right lower pole Stomach/Bowel: There is a small hiatal hernia. The stomach and duodenum are normal. There is no small bowel obstruction or evidence of inflammation. There is redundant sigmoid colon with diverticulosis but no acute inflammation. No focal colonic abnormality. The appendix is normal. Vascular/Lymphatic: There is atherosclerotic calcification of the non aneurysmal abdominal aorta. No abdominal or pelvic adenopathy. Reproductive: Normal prostate and seminal vesicles. Musculoskeletal: Bridging osteophyte at the right sacroiliac joint. Multilevel lumbar facet arthrosis and osteophytosis without bony spinal canal stenosis. Normal visualized extrathoracic and extraperitoneal soft tissues. Other: Delayed phase imaging of the kidneys and ureters is unremarkable. IMPRESSION: 1. No acute abnormality of the abdomen or pelvis. 2. Aortic atherosclerosis. Electronically Signed   By: Deatra Robinson M.D.   On: 08/31/2016 01:23        Scheduled Meds: . amLODipine  5 mg Oral Daily  . aspirin EC  81 mg Oral Daily  .  atorvastatin  20 mg Oral q1800  . carvedilol  25 mg Oral BID WC  . clopidogrel  75 mg Oral Daily  . enoxaparin (LOVENOX) injection  40 mg Subcutaneous Q24H  . insulin aspart  0-5 Units Subcutaneous QHS  . insulin aspart  0-9 Units Subcutaneous TID WC  . isosorbide mononitrate  30 mg Oral Daily  . lisinopril  20 mg Oral Daily  . loratadine  10 mg Oral Daily  . meclizine  25 mg Oral TID  . metFORMIN  1,000 mg Oral Q supper  . metFORMIN  500 mg Oral Q breakfast  . oxymetazoline  1 spray Each Nare BID  . sodium chloride flush  3 mL Intravenous Q12H   Continuous  Infusions:   LOS: 0 days    Time spent: 30 minutes    Winter Jocelyn, PA-Student   If 7PM-7AM, please contact night-coverage [email or web address] Password [facility] 09/01/2016, 10:29 AM

## 2016-09-01 NOTE — Evaluation (Signed)
Physical Therapy Evaluation Patient Details Name: Christopher Roberts MRN: [id number] DOB: February 15, 1946 Today's Date: 09/01/2016   History of Present Illness  Christopher Roberts is a 71 y.o. male with a past medical history significant for CAD s/p CABG in 2009, HTN, NIDDM who presents with acute dizziness.  Clinical Impression  Patient presents with decreased independence and safety with mobility with fall risk due to multi canal BPPV.  He was previously independent and currently needs min A for ambulation with RW.  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  PT to follow until d/c     Follow Up Recommendations Outpatient PT (neurorehab for vestibular PT)    Equipment Recommendations  Rolling walker with 5" wheels    Recommendations for Other Services       Precautions / Restrictions Precautions Precautions: Fall      Mobility  Bed Mobility Overal bed mobility: Needs Assistance Bed Mobility: Supine to Sit;Sit to Supine     Supine to sit: Min assist Sit to supine: Min guard   General bed mobility comments: assist due to severe nystagmus with position change  Transfers Overall transfer level: Needs assistance Equipment used: Rolling walker (2 wheeled);None Transfers: Sit to/from Stand Sit to Stand: Min assist         General transfer comment: initially trial without device and pt unsteady with wide BOS and L knee antalgia. Used walker and min a for balance  Ambulation/Gait Ambulation/Gait assistance: Min assist;Min guard Ambulation Distance (Feet): 200 Feet Assistive device: Rolling walker (2 wheeled) Gait Pattern/deviations: Step-through pattern;Decreased stride length;Wide base of support     General Gait Details: cues to use targeting if dizzy, but pt relates 3/10 overall dizziness with ambulation; LOB x 3 with min a  recovery with RW  Stairs            Wheelchair Mobility    Modified Rankin (Stroke Patients Only)       Balance Overall balance assessment: Needs assistance   Sitting balance-Leahy Scale: Good       Standing balance-Leahy Scale: Poor Standing balance comment: unable to balance without A                             Pertinent Vitals/Pain Pain Assessment: No/denies pain    Home Living Family/patient expects to be discharged to:: Private residence Living Arrangements: Spouse/significant other Available Help at Discharge: Family Type of Home: House Home Access: Stairs to enter Entrance Stairs-Rails: Right Entrance Stairs-Number of Steps: 3 Home Layout: Two level;Able to live on main level with bedroom/bathroom Home Equipment: None      Prior Function Level of Independence: Independent               Hand Dominance   Dominant Hand: Right    Extremity/Trunk Assessment   Upper Extremity Assessment Upper Extremity Assessment: Overall WFL for tasks assessed    Lower Extremity Assessment Lower Extremity Assessment: Overall WFL for tasks assessed       Communication   Communication: No difficulties  Cognition Arousal/Alertness: Awake/alert Behavior During Therapy: WFL for tasks assessed/performed Overall Cognitive Status: Within Functional Limits for tasks assessed  General Comments General comments (skin integrity, edema, etc.): Wife present and educated both on vestibular pathology including BPPV and labyrinthitis.  also treated for L posterior canal BPPV with semont maneuver and Eply manuever.      Vestibular Assessment - 09/01/16 0001      Vestibular Assessment   General Observation Patient relates symptoms started Wednesday after rolling trash cans down and up way up drive felt light headed and almost fell back.  Initially woozy but has spinning closing eyes.  States h/o week  and half of URI and congestion.  No ear pain or hearing change.  No falls.     Symptom Behavior   Type of Dizziness Lightheadedness   Frequency of Dizziness intermittent   Duration of Dizziness minutes   Aggravating Factors Activity in general;Turning body quickly;Supine to sit;Sit to stand   Relieving Factors Head stationary;Rest     Occulomotor Exam   Occulomotor Alignment Normal   Spontaneous Absent   Gaze-induced Right beating nystagmus with R gaze   Smooth Pursuits Intact   Saccades Intact     Vestibulo-Occular Reflex   VOR 1 Head Only (x 1 viewing) horizontal and vertical x 30 sec without difficulty, but 3/10 dizziness with vertical movements   VOR to Slow Head Movement Positive right   VOR Cancellation Normal     Auditory   Comments intact and equal to scratch test bilaterally     Positional Testing   Dix-Hallpike Dix-Hallpike Left   Sidelying Test Sidelying Right;Sidelying Left     Dix-Hallpike Left   Dix-Hallpike Left Duration 90 sec   Dix-Hallpike Left Symptoms Upbeat, left rotatory nystagmus  which after 45 seconds coverted to L horizontal      Sidelying Right   Sidelying Right Duration 90 sec   Sidelying Right Symptoms Right nystagmus     Sidelying Left   Sidelying Left Duration 60 sec   Sidelying Left Symptoms Upbeat, left rotatory nystagmus      Exercises     Assessment/Plan    PT Assessment Patient needs continued PT services  PT Problem List Decreased activity tolerance;Decreased balance;Decreased knowledge of use of DME;Decreased mobility;Other (comment) (dizziness)       PT Treatment Interventions DME instruction;Gait training;Balance training;Stair training;Functional mobility training;Neuromuscular re-education;Other (comment) (canalith repositioning)    PT Goals (Current goals can be found in the Care Plan section)  Acute Rehab PT Goals Patient Stated Goal: To get rid of dizziness PT Goal Formulation: With patient Time For Goal  Achievement: 09/08/16 Potential to Achieve Goals: Good    Frequency Min 3X/week   Barriers to discharge        Co-evaluation               End of Session Equipment Utilized During Treatment: Gait belt Activity Tolerance: Other (comment) (due to nausea) Patient left: in bed;with call bell/phone within reach   PT Visit Diagnosis: Other abnormalities of gait and mobility (R26.89);BPPV    Time: 1610-9604 PT Time Calculation (min) (ACUTE ONLY): 52 min   Charges:   PT Evaluation $PT Eval Moderate Complexity: 1 Procedure PT Treatments $Gait Training: 8-22 mins $Canalith Rep Proc: 8-22 mins   PT G Codes:   PT G-Codes **NOT FOR INPATIENT CLASS** Functional Assessment Tool Used: AM-PAC 6 Clicks Basic Mobility Functional Limitation: Mobility: Walking and moving around Mobility: Walking and Moving Around Current Status (V4098): At least 40 percent but less than 60 percent impaired, limited or restricted Mobility: Walking and Moving Around Goal Status [phone]): At least 20  percent but less than 40 percent impaired, limited or restricted    Sheran Lawless, [place] [phone] 09/01/2016   Elray Mcgregor 09/01/2016, 12:47 PM

## 2016-09-01 NOTE — Care Management Note (Signed)
Case Management Note  Patient Details  Name: Christopher Roberts MRN: [id number] Date of Birth: October 28, 1945  Subjective/Objective:                 Patient in obs from home with spouse for HTN urgency, dizzyness, vertigo. CM consult for medication needs. Patient currently covered by [facility], patient is not prescribed any medications that qualify for assistance or have coupon cards at this time.    Action/Plan:  CM will continue to follow for DC planning needs.  Expected Discharge Date:                  Expected Discharge Plan:  Home/Self Care  In-House Referral:     Discharge planning Services  CM Consult  Post Acute Care Choice:    Choice offered to:     DME Arranged:    DME Agency:     HH Arranged:    HH Agency:     Status of Service:  In process, will continue to follow  If discussed at Long Length of Stay Meetings, dates discussed:    Additional Comments:  Lawerance Sabal, RN 09/01/2016, 2:48 PM

## 2016-09-01 NOTE — Consult Note (Signed)
NEURO HOSPITALIST CONSULT NOTE   Requestig physician: Dr. Ronalee Belts   Reason for Consult: Vertigo   History obtained from:  Patient Christopher Roberts    HPI:                                                                                                                                          [name] is an 71 y.o. male who has had a upper respiratory infection/congestion over the last few days, for which she's been taking Sudafed, but was otherwise in his normal health, adherent to home medicaitons. Thursday he was taking his garbage cans of the street when he had sudden onset of lightheadedness, feeling "off balance with spinning and vertigo".  In ED his BP was 239/114 mmHg.  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 Patient was admitted to hospital.   The Symptoms are provoked by movement of the head and movement of the body but then abate if he stays still. The symptoms last less than 1 minute. Patient has no symptoms if he remains in one position. He does recall possibly having a ear infection as he felt some fullness in one of his ears but is not very clear about this. He denies the sensation of falling forward but states the room is spinning from right to left.  Dix-Hallpike maneuver was obtained and findings are below   Past Medical History:  Diagnosis Date  . CAD (coronary artery disease)    a. s/p MI in 2005 (symptom of "indigestion"); b. CABG x 3 in 3/09: L-LAD, S-OM, EF unknown  . Colon polyps   . Diabetes mellitus   . DJD (degenerative joint disease)   . HLD (hyperlipidemia)   . HTN (hypertension)   . MI (myocardial infarction) (HCC) 2005   manifested by indigestion  . Murmur    echo 6/12:  Upper septal thickening, no LVOT gradient,, EF 65%, mild LAE      Past Surgical History:  Procedure Laterality Date  .  CHOLECYSTECTOMY  2002  . CORONARY ARTERY BYPASS GRAFT  2009   x3  . TONSILLECTOMY      Family History  Problem Relation Age of Onset  . Colon cancer Mother   . Heart attack Mother       Social History:  reports that he has been smoking Cigarettes.  He has smoked for the past 40.00 years. He has never used smokeless tobacco. He reports that he does not drink alcohol or use drugs.  No Known Allergies  MEDICATIONS:  Scheduled: . amLODipine  5 mg Oral Daily  . aspirin EC  81 mg Oral Daily  . atorvastatin  20 mg Oral q1800  . carvedilol  25 mg Oral BID WC  . clopidogrel  75 mg Oral Daily  . enoxaparin (LOVENOX) injection  40 mg Subcutaneous Q24H  . insulin aspart  0-5 Units Subcutaneous QHS  . insulin aspart  0-9 Units Subcutaneous TID WC  . isosorbide mononitrate  30 mg Oral Daily  . lisinopril  20 mg Oral Daily  . loratadine  10 mg Oral Daily  . meclizine  25 mg Oral TID  . metFORMIN  1,000 mg Oral Q supper  . metFORMIN  500 mg Oral Q breakfast  . oxymetazoline  1 spray Each Nare BID  . sodium chloride flush  3 mL Intravenous Q12H     ROS:                                                                                                                                       History obtained from the patient  General ROS: negative for - chills, fatigue, fever, night sweats, weight gain or weight loss Psychological ROS: negative for - behavioral disorder, hallucinations, memory difficulties, mood swings or suicidal ideation Ophthalmic ROS: negative for - blurry vision, double vision, eye pain or loss of vision ENT ROS: Positive for -  vertigo Allergy and Immunology ROS: negative for - hives or itchy/watery eyes Hematological and Lymphatic ROS: negative for - bleeding problems, bruising or swollen lymph nodes Endocrine ROS: negative for - galactorrhea, hair pattern  changes, polydipsia/polyuria or temperature intolerance Respiratory ROS: negative for - cough, hemoptysis, shortness of breath or wheezing Cardiovascular ROS: negative for - chest pain, dyspnea on exertion, edema or irregular heartbeat Gastrointestinal ROS: Positive for -  nausea/vomiting  Genito-Urinary ROS: negative for - dysuria, hematuria, incontinence or urinary frequency/urgency Musculoskeletal ROS: negative for - joint swelling or muscular weakness Neurological ROS: as noted in HPI Dermatological ROS: negative for rash and skin lesion changes   Blood pressure 136/77, pulse 62, temperature 98.2 F (36.8 C), temperature source Oral, resp. rate 16, height 5\' 10"  (1.778 m), weight 99.9 kg (220 lb 3.2 oz), SpO2 97 %.   Neurologic Examination:                                                                                                      HEENT-  Normocephalic, no lesions, without obvious abnormality.  Normal external eye and conjunctiva.  Normal TM's bilaterally.  Normal auditory canals and external ears. Normal external nose, mucus membranes and septum.  Normal pharynx. Cardiovascular- S1, S2 normal, pulses palpable throughout   Lungs- chest clear, no wheezing, rales, normal symmetric air entry Abdomen- normal findings: aorta normal Extremities- no edema Lymph-no adenopathy palpable Musculoskeletal-no joint tenderness, deformity or swelling Skin-warm and dry, no hyperpigmentation, vitiligo, or suspicious lesions  Neurological Examination Mental Status: Alert, oriented, thought content appropriate.  Speech fluent without evidence of aphasia.  Able to follow 3 step commands without difficulty. Cranial Nerves: II: Visual fields grossly normal,  III,IV, VI: ptosis not present, extra-ocular motions intact bilaterally, pupils equal, round, reactive to light and accommodation --With Dix-Hallpike maneuver and head turned to the left patient had rotational nystagmus.  When head was turned  to the right patient had horizontal nystagmus with fast phase to the left V,VII: smile symmetric, facial light touch sensation normal bilaterally VIII: hearing normal bilaterally IX,X: uvula rises symmetrically XI: bilateral shoulder shrug XII: midline tongue extension Motor: Right : Upper extremity   5/5    Left:     Upper extremity   5/5  Lower extremity   5/5     Lower extremity   5/5 Tone and bulk:normal tone throughout; no atrophy noted Sensory: Pinprick and light touch intact throughout, bilaterally Deep Tendon Reflexes: 2+ and symmetric throughout Plantars: Right: downgoing   Left: downgoing Cerebellar: normal finger-to-nose,and normal heel-to-shin test Gait: Not tested      Lab Results: Basic Metabolic Panel:  Recent Labs Lab 08/30/16 1900 08/31/16 0443  NA 136 137  K 4.1 3.8  CL 105 106  CO2 20* 22  GLUCOSE 225* 172*  BUN 11 10  CREATININE 1.01 0.93  CALCIUM 9.3 8.6*    Liver Function Tests:  Recent Labs Lab 08/30/16 1900 08/31/16 0443  AST 18 16  ALT 14* 13*  ALKPHOS 67 58  BILITOT 0.8 0.6  PROT 7.6 6.5  ALBUMIN 4.0 3.5   No results for input(s): LIPASE, AMYLASE in the last 168 hours. No results for input(s): AMMONIA in the last 168 hours.  CBC:  Recent Labs Lab 08/30/16 1900 08/31/16 0443  WBC 11.9* 8.5  NEUTROABS 7.6  --   HGB 15.1 14.3  HCT 43.6 41.4  MCV 84.0 84.1  PLT 191 177    Cardiac Enzymes:  Recent Labs Lab 08/31/16 0443  TROPONINI <0.03    Lipid Panel: No results for input(s): CHOL, TRIG, HDL, CHOLHDL, VLDL, LDLCALC in the last 168 hours.  CBG:  Recent Labs Lab 08/31/16 1118 08/31/16 1633 08/31/16 2116 09/01/16 0719 09/01/16 1109  GLUCAP 207* 240* 157* 148* 218*    Microbiology: No results found for this or any previous visit.  Coagulation Studies: No results for input(s): LABPROT, INR in the last 72 hours.  Imaging: Ct Head Wo Contrast  Result Date: 08/31/2016 CLINICAL DATA:  Acute onset of  dizziness and lightheadedness. Initial encounter. EXAM: CT HEAD WITHOUT CONTRAST TECHNIQUE: Contiguous axial images were obtained from the base of the skull through the vertex without intravenous contrast. COMPARISON:  MRI of the brain performed 08/30/2016 FINDINGS: Brain: No evidence of acute infarction, hemorrhage, hydrocephalus, extra-axial collection or mass lesion/mass effect. Scattered periventricular and subcortical white matter change likely reflects small vessel ischemic microangiopathy. Mild cerebellar atrophy is noted. Chronic lacunar infarcts are noted at the cerebellar hemispheres bilaterally. Dense calcification is seen at the basal ganglia bilaterally. A small chronic infarct is noted at the left temporal lobe. The brainstem and fourth ventricle  are within normal limits. The third and lateral ventricles are unremarkable in appearance. No mass effect or midline shift is seen. Vascular: No hyperdense vessel or unexpected calcification. Skull: There is no evidence of fracture; visualized osseous structures are unremarkable in appearance. Sinuses/Orbits: The orbits are within normal limits. Mucosal thickening is noted at the right maxillary sinus. The remaining paranasal sinuses and mastoid air cells are well-aerated. Other: No significant soft tissue abnormalities are seen. IMPRESSION: 1. No acute intracranial pathology seen on CT. 2. Scattered small vessel ischemic microangiopathy. 3. 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 4. Mucosal thickening at the right maxillary sinus. Electronically Signed   By: Roanna Raider M.D.   On: 08/31/2016 01:21   Mr Brain Wo Contrast  Result Date: 08/30/2016 CLINICAL DATA:  Lightheadedness, nausea and vomiting EXAM: MRI HEAD WITHOUT CONTRAST TECHNIQUE: Multiplanar, multiecho pulse sequences of the brain and surrounding structures were obtained without intravenous contrast. COMPARISON:   None. FINDINGS: Brain: The midline structures are normal. No focal diffusion restriction to indicate acute infarct. No intraparenchymal hemorrhage. There is an old small right cerebellar infarct, as well as a chronic lacunar infarct in the right corona radiata. There is beginning confluent hyperintense T2-weighted signal within the periventricular white matter, most often seen in the setting of chronic microvascular ischemia. No mass lesion. Focus of chronic microhemorrhage in the right cerebellum. No hydrocephalus, age advanced atrophy or lobar predominant volume loss. No dural abnormality or extra-axial collection. Vascular: Major intracranial arterial and venous sinus flow voids are preserved. Skull and upper cervical spine: The visualized skull base, calvarium, upper cervical spine and extracranial soft tissues are normal. Sinuses/Orbits: No fluid levels or advanced mucosal thickening. No mastoid effusion. Normal orbits. IMPRESSION: 1. Chronic microvascular ischemia without acute intracranial abnormality. 2. 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 Electronically Signed   By: Deatra Robinson M.D.   On: 08/30/2016 22:15   Ct Abdomen Pelvis W Contrast  Result Date: 08/31/2016 CLINICAL DATA:  Nausea and vomiting EXAM: CT ABDOMEN AND PELVIS WITH CONTRAST TECHNIQUE: Multidetector CT imaging of the abdomen and pelvis was performed using the standard protocol following bolus administration of intravenous contrast. CONTRAST:  100 mL ISOVUE-300 IOPAMIDOL (ISOVUE-300) INJECTION 61% COMPARISON:  None. FINDINGS: Lower chest: No pulmonary nodules. No visible pleural or pericardial effusion. Hepatobiliary: Normal hepatic size and contours without focal liver lesion. No perihepatic ascites. No intra- or extrahepatic biliary dilatation. Status post cholecystectomy. Pancreas: Normal pancreatic contours and enhancement. No peripancreatic fluid collection or pancreatic ductal dilatation. Spleen:  Normal. Adrenals/Urinary Tract: Normal adrenal glands. No hydronephrosis or solid renal mass. 1.8 cm renal cyst at the right lower pole Stomach/Bowel: There is a small hiatal hernia. The stomach and duodenum are normal. There is no small bowel obstruction or evidence of inflammation. There is redundant sigmoid colon with diverticulosis but no acute inflammation. No focal colonic abnormality. The appendix is normal. Vascular/Lymphatic: There is atherosclerotic calcification of the non aneurysmal abdominal aorta. No abdominal or pelvic adenopathy. Reproductive: Normal prostate and seminal vesicles. Musculoskeletal: Bridging osteophyte at the right sacroiliac joint. Multilevel lumbar facet arthrosis and osteophytosis without bony spinal canal stenosis. Normal visualized extrathoracic and extraperitoneal soft tissues. Other: Delayed phase imaging of the kidneys and ureters is unremarkable. IMPRESSION: 1. No acute abnormality of the abdomen or pelvis. 2. Aortic atherosclerosis. Electronically Signed   By: Deatra Robinson M.D.   On: 08/31/2016 01:23    Vestibular rehabilitation results:  Vestibular Assessment - 09/01/16 0001            Vestibular Assessment   General Observation Patient relates symptoms started Wednesday after rolling trash cans down and up way up drive felt light headed and almost fell back.  Initially woozy but has spinning closing eyes.  States h/o week and half of URI and congestion.  No ear pain or hearing change.  No falls.       Symptom Behavior   Type of Dizziness Lightheadedness   Frequency of Dizziness intermittent   Duration of Dizziness minutes   Aggravating Factors Activity in general;Turning body quickly;Supine to sit;Sit to stand   Relieving Factors Head stationary;Rest       Occulomotor Exam   Occulomotor Alignment Normal   Spontaneous Absent   Gaze-induced Right beating nystagmus with R gaze   Smooth Pursuits Intact   Saccades Intact        Vestibulo-Occular Reflex   VOR 1 Head Only (x 1 viewing) horizontal and vertical x 30 sec without difficulty, but 3/10 dizziness with vertical movements   VOR to Slow Head Movement Positive right   VOR Cancellation Normal       Auditory   Comments intact and equal to scratch test bilaterally       Positional Testing   Dix-Hallpike Dix-Hallpike Left   Sidelying Test Sidelying Right;Sidelying Left       Dix-Hallpike Left   Dix-Hallpike Left Duration 90 sec   Dix-Hallpike Left Symptoms Upbeat, left rotatory nystagmus  which after 45 seconds coverted to L horizontal        Sidelying Right   Sidelying Right Duration 90 sec   Sidelying Right Symptoms Right nystagmus       Sidelying Left   Sidelying Left Duration 60 sec   Sidelying Left Symptoms Upbeat, left rotatory nystagmus        Assessment and plan per attending neurologist  Felicie Morn PA-C [facility] [phone]  09/01/2016, 12:16 PM  I have seen and evaluated the patient. I have reviewed the above note and made appropriate changes.   On my exam, he has left nystagmus with left gaze, upgaze, downgaze. I do not see right beating nystagmus. Head thrust test is negative.   Assessment/Plan: 71 year old male with new onset vertigo and nystagmus. I do not see multidirectional nystagmus, though if he did have right beating nystagmus as documented by the physical therapist, then I would raise my concern for central etiology considerably.  With nystagmus even at rest, I think my concern for labyrinthitis is raise considerably as opposed to BPPV. I would favor steroid treatment.  1) prednisone 60 mg 5 days followed by 50 for one day, 40 for one day, 30 for 1 day, 20 for one day, 10 for one day then stop 2) repeat MRI with thin cuts through cerebellum 3) antiplatelet therapy for signature prevention 4) given that he has never been evaluated for multifocal posterior circulation strokes, I think  that an MR angiogram of the head and neck is reasonable. 5) no need for carotid Dopplers 6) agree with echocardiogram 7) lipid panel 8) PT  Ritta Slot, MD [facility] [phone]  If 7pm- 7am, please page neurology on call as listed in AMION.

## 2016-09-02 ENCOUNTER — Observation Stay (HOSPITAL_BASED_OUTPATIENT_CLINIC_OR_DEPARTMENT_OTHER): Payer: Medicare Other

## 2016-09-02 DIAGNOSIS — R42 Dizziness and giddiness: Secondary | ICD-10-CM

## 2016-09-02 DIAGNOSIS — I160 Hypertensive urgency: Secondary | ICD-10-CM | POA: Diagnosis not present

## 2016-09-02 DIAGNOSIS — I10 Essential (primary) hypertension: Secondary | ICD-10-CM | POA: Diagnosis not present

## 2016-09-02 LAB — LIPID PANEL
CHOL/HDL RATIO: 4.3 ratio
CHOLESTEROL: 124 mg/dL (ref 0–200)
HDL: 29 mg/dL — ABNORMAL LOW (ref >40)
LDL Cholesterol: 61 mg/dL (ref 0–99)
TRIGLYCERIDES: 169 mg/dL — AB (ref <150)
VLDL: 34 mg/dL (ref 0–40)

## 2016-09-02 LAB — ECHOCARDIOGRAM COMPLETE
Height: 70 in
Weight: 3491.2 oz

## 2016-09-02 LAB — GLUCOSE, CAPILLARY
Glucose-Capillary: 159 mg/dL — ABNORMAL HIGH (ref 65–99)
Glucose-Capillary: 212 mg/dL — ABNORMAL HIGH (ref 65–99)

## 2016-09-02 MED ORDER — MECLIZINE HCL 12.5 MG PO TABS: 12.5000 mg | ORAL_TABLET | Freq: Three times a day (TID) | ORAL | 0 refills | Status: DC | PRN

## 2016-09-02 MED ORDER — PREDNISONE 10 MG PO TABS: 10.0000 mg | ORAL_TABLET | Freq: Every day | ORAL | 0 refills | Status: DC

## 2016-09-02 MED ORDER — PREDNISONE 50 MG PO TABS: 50.0000 mg | ORAL_TABLET | Freq: Every day | ORAL | 0 refills | Status: AC

## 2016-09-02 NOTE — Progress Notes (Signed)
Subjective: Continues to improve  Exam: Vitals:   09/02/16 0405 09/02/16 0800  BP: 129/83 (!) 160/94  Pulse: 65 65  Resp: 19 20  Temp: 98.5 F (36.9 C)    Gen: In bed, NAD Resp: non-labored breathing, no acute distress Abd: soft, nt  Neuro: MS: Awake, alert, oriented CN: EOMI, nystagmus is less prominent today.  Impression: 71 year old male with vertigo. Given the presence of nystagmus at rest, I started steroids with the consideration of labyrinthitis. Also possible would be BPPV. No signs of acute stroke even on thin section cerebellar imaging.  He does have previous strokes likely small vessel disease.  Though there is question of carotid stenosis, I think this would be asymptomatic and can be followed up with vascular ultrasound at later date.  Recommendations: 1) continue antiplatelet therapy, from a stroke prevention standpoint only a single agent is needed, but delayed to prior therapy is fine if indicated for other reasons. 2) LDL less than 70, okay 3) vestibular rehabilitation 4) prednisone course as indicated previously for possible labyrinthitis. 5) no further recommendations at this time, please call with further questions or concerns.  Ritta Slot, MD [facility] [phone]  If 7pm- 7am, please page neurology on call as listed in AMION.

## 2016-09-02 NOTE — Care Management Note (Signed)
Case Management Note  Patient Details  Name: Christopher Roberts MRN: [id number] Date of Birth: 10/13/1945  Subjective/Objective:  71 y.o. M admitted 08/30/2016 with Hypertensive urgency and evaluated by PT for unsafe ambulation. MD ordered Outpt PT/OT and RW, which CM ordered from [facility] and faxed all OUTPT orders to [facility] @ [phone] with facesheet and dc summary.                  Action/Plan:CM will sign off for now but will be available should additional discharge needs arise or disposition change.    Expected Discharge Date:  09/02/16               Expected Discharge Plan:  Home/Self Care  In-House Referral:     Discharge planning Services  CM Consult  Post Acute Care Choice:  Durable Medical Equipment Choice offered to:  Patient  DME Arranged:  Dan Humphreys rolling DME Agency:  [facility]  HH Arranged:  PT, OT (Outpatient (vestibular)) HH Agency:  NA  Status of Service:  Completed, signed off  If discussed at Long Length of Stay Meetings, dates discussed:    Additional Comments:  Yvone Neu, RN 09/02/2016, 2:48 PM

## 2016-09-02 NOTE — Progress Notes (Signed)
Physical Therapy Treatment Patient Details Name: Christopher Roberts MRN: [id number] DOB: [birth year] Today's Date: 09/02/2016    History of Present Illness MASATO PETTIE is a 71 y.o. male with a past medical history significant for CAD s/p CABG in 2009, HTN, NIDDM who presents with acute dizziness..  Dx:  likely labrynthitis vs BPPV     PT Comments    Patient reports some improved symptoms since last treatment. Continues with multidirectional nystagmus.  Feel may have some related to labyrinthitis that is difficult to see in quiet sitting.  However, attempted tx this session for both R hoizontal and L posterior canals.  Feel continued outpatient PT for further tx indicated.  Wife educated how to assist pt on stairs.  Planned d/c today with RW already delivered to room.    Follow Up Recommendations  Outpatient PT (neurorehab for vestibular PT)     Equipment Recommendations  Rolling walker with 5" wheels    Recommendations for Other Services       Precautions / Restrictions Precautions Precautions: Fall    Mobility  Bed Mobility   Bed Mobility: Supine to Sit     Supine to sit: Supervision Sit to supine: Modified independent (Device/Increase time)   General bed mobility comments: for safety, sat up quickly and needed a minute due to mild symptoms  Transfers Overall transfer level: Needs assistance Equipment used: Rolling walker (2 wheeled) Transfers: Sit to/from Stand Sit to Stand: Min guard Stand pivot transfers: Min guard       General transfer comment: cues for forward lean on walker due to posterior with legs braced on bed  Ambulation/Gait Ambulation/Gait assistance: Min assist Ambulation Distance (Feet): 200 Feet Assistive device: Rolling walker (2 wheeled) Gait Pattern/deviations: Step-through pattern;Decreased stride length;Staggering right     General Gait Details: R lateral lean with ambulation and initially min A for balance, improved to minguard with time,  but still unsteady on turns   Stairs Stairs: Yes   Stair Management: Step to pattern;Sideways;One rail Left Number of Stairs: 3 General stair comments: cues for technique, assist for safety and cues to stop to stare at target on third step due to obvious increase in symptoms with more imbalance, then to slowly descend; educated wife on technique after practice as she arrived later in session.  Wheelchair Mobility    Modified Rankin (Stroke Patients Only)       Balance Overall balance assessment: Needs assistance Sitting-balance support: Feet supported Sitting balance-Leahy Scale: Good     Standing balance support: Bilateral upper extremity supported Standing balance-Leahy Scale: Fair Standing balance comment: Able to statically stand to pull pants over hips                             Cognition Arousal/Alertness: Awake/alert Behavior During Therapy: WFL for tasks assessed/performed Overall Cognitive Status: Within Functional Limits for tasks assessed                                        Exercises      General Comments General comments (skin integrity, edema, etc.): Noted L upward beating nystagmus lying flat, R head rotation mild R hoizontal nystagmus and treated for R horizontal canal BPPV; then noted more vigorous L rotary nystagmus lying back down so treated again for L posterior canal, but mild symptoms in L position for dix hallpike and  noted severe R rotary nystagmus last turn of [place] that lasted over a minute.  Continued education for pt on need for targeting and walker due to imbalance and for plans for outpatient follow up.       Pertinent Vitals/Pain Pain Assessment: No/denies pain    Home Living Family/patient expects to be discharged to:: Private residence Living Arrangements: Spouse/significant other Available Help at Discharge: Family Type of Home: House Home Access: Stairs to enter Entrance Stairs-Rails: Right Home  Layout: Two level;Able to live on main level with bedroom/bathroom Home Equipment: Shower seat      Prior Function Level of Independence: Independent          PT Goals (current goals can now be found in the care plan section) Acute Rehab PT Goals Patient Stated Goal: to go home  Progress towards PT goals: Progressing toward goals    Frequency    Min 3X/week      PT Plan Current plan remains appropriate    Co-evaluation             End of Session Equipment Utilized During Treatment: Gait belt Activity Tolerance: Patient tolerated treatment well Patient left: in bed;with family/visitor present (seated EOB with handoff to OT)         Time: 1610-9604 PT Time Calculation (min) (ACUTE ONLY): 27 min  Charges:  $Gait Training: 8-22 mins $Canalith Rep Proc: 8-22 mins                    G CodesSheran Lawless, [place] [phone] 09/02/2016    Elray Mcgregor 09/02/2016, 3:35 PM

## 2016-09-02 NOTE — Evaluation (Signed)
Occupational Therapy Evaluation Patient Details Name: Christopher Roberts MRN: [id number] DOB: 04-25-1946 Today's Date: 09/02/2016    History of Present Illness Christopher Roberts is a 71 y.o. male with a past medical history significant for CAD s/p CABG in 2009, HTN, NIDDM who presents with acute dizziness..  Dx:  likely labrynthitis vs BPPV    Clinical Impression   Patient evaluated by Occupational Therapy with no further acute OT needs identified. All education has been completed and the patient has no further questions. Pt requires min guard to min A for ADLs.  Education completed.  Wife is to provide recommended amount of assist.  See below for any follow-up Occupational Therapy or equipment needs. OT is signing off. Thank you for this referral.      Follow Up Recommendations  No OT follow up (pt for OPPT for balance and dizziness);Supervision/Assistance - 24 hour    Equipment Recommendations  None recommended by OT    Recommendations for Other Services       Precautions / Restrictions Precautions Precautions: Fall      Mobility Bed Mobility               General bed mobility comments: Pt sitting EOB   Transfers Overall transfer level: Needs assistance Equipment used: Rolling walker (2 wheeled);None Transfers: Sit to/from [employer] to Stand: Min guard Stand pivot transfers: Min guard       General transfer comment: close min guard assist for safety and balance.  Pt required cues for proper hand placement     Balance Overall balance assessment: Needs assistance Sitting-balance support: Feet supported Sitting balance-Leahy Scale: Good     Standing balance support: During functional activity;Single extremity supported Standing balance-Leahy Scale: Fair Standing balance comment: Able to statically stand to pull pants over hips                            ADL either performed or assessed with clinical judgement   ADL Overall ADL's  : Needs assistance/impaired Eating/Feeding: Independent   Grooming: Wash/dry hands;Wash/dry face;Oral care;Brushing hair;Min guard;Standing   Upper Body Bathing: Set up;Sitting   Lower Body Bathing: Min guard;Sit to/from stand   Upper Body Dressing : Set up;Sitting   Lower Body Dressing: Min guard;Sit to/from stand   Toilet Transfer: Min guard;Ambulation;Comfort height toilet;Regular Toilet;Grab bars;RW   Toileting- Architect and Hygiene: Min guard;Sit to/from stand   Tub/ Shower Transfer: Minimal assistance;Ambulation;Shower Dealer Details (indicate cue type and reason): Pt and wife instructed in safe technique and wife is to be available to assist when pt transferring.  Problem solved with both positioning of seat and best method for entering tub.   Instructed pt to sit to shower until dizziness and balance have improved to baseline  Functional mobility during ADLs: Min guard;Rolling walker       Vision         Perception     Praxis      Pertinent Vitals/Pain Pain Assessment: No/denies pain     Hand Dominance Right   Extremity/Trunk Assessment Upper Extremity Assessment Upper Extremity Assessment: Overall WFL for tasks assessed   Lower Extremity Assessment Lower Extremity Assessment: Defer to PT evaluation   Cervical / Trunk Assessment Cervical / Trunk Assessment: Normal   Communication Communication Communication: No difficulties   Cognition Arousal/Alertness: Awake/alert Behavior During Therapy: WFL for tasks assessed/performed Overall Cognitive Status: Within Functional Limits for tasks assessed  General Comments  wife present     Exercises     Shoulder Instructions      Home Living Family/patient expects to be discharged to:: Private residence Living Arrangements: Spouse/significant other Available Help at Discharge: Family Type of Home: House Home  Access: Stairs to enter [employer] of Steps: 3 Entrance Stairs-Rails: Right Home Layout: Two level;Able to live on main level with bedroom/bathroom     Bathroom Shower/Tub: Tub/shower unit;Curtain   Firefighter: Standard     Home Equipment: Shower seat          Prior Functioning/Environment Level of Independence: Independent                 OT Problem List: Impaired balance (sitting and/or standing);Decreased knowledge of use of DME or AE      OT Treatment/Interventions:      OT Goals(Current goals can be found in the care plan section) Acute Rehab OT Goals Patient Stated Goal: to go home  OT Goal Formulation: All assessment and education complete, DC therapy  OT Frequency:     Barriers to D/C:            Co-evaluation              End of Session Equipment Utilized During Treatment: Rolling walker Nurse Communication: Mobility status  Activity Tolerance: Patient tolerated treatment well Patient left: in bed;with call bell/phone within reach;with family/visitor present  OT Visit Diagnosis: Unsteadiness on feet (R26.81);Dizziness and giddiness (R42)                Time: 4098-1191 OT Time Calculation (min): 15 min Charges:  OT General Charges $OT Visit: 1 Procedure OT Evaluation $OT Eval Low Complexity: 1 Procedure G-Codes: OT G-codes **NOT FOR INPATIENT CLASS** Functional Assessment Tool Used: AM-PAC 6 Clicks Daily Activity Functional Limitation: Self care Self Care Current Status (Y7829): At least 40 percent but less than 60 percent impaired, limited or restricted Self Care Goal Status (F6213): At least 40 percent but less than 60 percent impaired, limited or restricted Self Care Discharge Status [phone]): At least 40 percent but less than 60 percent impaired, limited or restricted   [employer], OTR/L [phone]   Jeani Hawking M 09/02/2016, 3:04 PM

## 2016-09-02 NOTE — Care Management Note (Signed)
Case Management Note  Patient Details  Name: NAKAI POLLIO MRN: [id number] Date of Birth: 1946/01/11  Subjective/Objective:                     Action/Plan:   Expected Discharge Date:  09/02/16               Expected Discharge Plan:  Home/Self Care  In-House Referral:     Discharge planning Services  CM Consult  Post Acute Care Choice:  Durable Medical Equipment Choice offered to:  Patient  DME Arranged:  Dan Humphreys rolling DME Agency:  [facility]  HH Arranged:  PT, OT (Outpatient (vestibular)) HH Agency:  NA  Status of Service:  Completed, signed off  If discussed at Long Length of Stay Meetings, dates discussed:    Additional Comments:  Yvone Neu, RN 09/02/2016, 2:21 PM

## 2016-09-02 NOTE — Progress Notes (Signed)
Echo has been read and verified. Patient received all discharge information and verbalized understanding.

## 2016-09-02 NOTE — Discharge Summary (Signed)
Physician Discharge Summary  Christopher Roberts EXB:[id number] DOB: Dec 24, 1945 DOA: 08/30/2016  PCP: Enrique Sack, MD  Admit date: 08/30/2016 Discharge date: 09/02/2016  Admitted From:Home Disposition:home with home care services  Recommendations for Outpatient Follow-up:  1. Follow up with PCP in 1-2 weeks 2. Please obtain BMP/CBC in one week   Home Health:yes Equipment/Devices:rolling walker Discharge Condition:stable CODE STATUS:full Diet recommendation:carb modified heart healthy diet  Brief/Interim Summary: 71 year old gentleman with history of hypertension, hyperlipidemia, type 2 diabetes, coronary artery disease status post CABG, tobacco abuse presented with sudden onset of dizziness and problem with balance. He had recent upper respiratory tract infection. On arrival to the ER patient had hypertensive urgency with systolic blood pressure more than 200s. Imaging studies including CT scan of head, MRI brain, MRA with no significant acute finding. Patient has no sign of infection and he has no orthostatic hypotension. He does not have chest pain or shortness of breath. Patient was evaluated by neurologist because of nystagmus and persistent dizziness. Neurology thinks that patient might have labyrinthitis. Started on oral prednisone with tapering dose. Also on meclizine as needed. Patient is already on aspirin and Plavix and statin. Recommended to continue that. Patient's symptoms significantly improved today. He is able to ambulate. Evaluated by PT OT. Patient will be discharged home with home care services including physical therapy, THERAPY, VISITING NURSE, HOME HEALTH AIDE, OUTPATIENT REFERRAL TO NEUROLOGY, OUTPATIENT REFERRAL TO VESTIBULAR PT.  Blood pressure better controlled. Recommended to continue home medications. At this time, patient is medically stable to discharge with outpatient follow-up.    Discharge Diagnoses:  Principal Problem:   Hypertensive urgency Active  Problems:   Essential hypertension   Coronary artery disease due to lipid rich plaque   Vomiting   Type 2 diabetes mellitus without complication, without long-term current use of insulin (HCC)   Dizziness    Discharge Instructions  Discharge Instructions    Ambulatory referral to Neurology    Complete by:  As directed    An appointment is requested in approximately: 4 weeks   Call MD for:  difficulty breathing, headache or visual disturbances    Complete by:  As directed    Call MD for:  extreme fatigue    Complete by:  As directed    Call MD for:  hives    Complete by:  As directed    Call MD for:  persistant dizziness or light-headedness    Complete by:  As directed    Call MD for:  persistant nausea and vomiting    Complete by:  As directed    Call MD for:  severe uncontrolled pain    Complete by:  As directed    Call MD for:  temperature >100.4    Complete by:  As directed    Diet - low sodium heart healthy    Complete by:  As directed    Diet Carb Modified    Complete by:  As directed    Discharge instructions    Complete by:  As directed    Please don't drive until you are re-evaluated by PCP or neurologist.   For home use only DME 4 wheeled rolling walker with seat    Complete by:  As directed    Patient needs a walker to treat with the following condition:  Dizziness   Increase activity slowly    Complete by:  As directed      Allergies as of 09/02/2016   No Known Allergies  Medication List    TAKE these medications   amLODipine 10 MG tablet Commonly known as:  NORVASC Take 1 tablet (10 mg total) by mouth daily.   aspirin 81 MG tablet Take 81 mg by mouth daily.   carvedilol 25 MG tablet Commonly known as:  COREG Take 25 mg by mouth 2 (two) times daily with a meal.   clopidogrel 75 MG tablet Commonly known as:  PLAVIX Take 75 mg by mouth daily.   isosorbide mononitrate 30 MG 24 hr tablet Commonly known as:  IMDUR Take 1 tablet (30 mg total)  by mouth daily.   lisinopril 10 MG tablet Commonly known as:  PRINIVIL,ZESTRIL Take 20 mg by mouth daily.   loratadine 10 MG tablet Commonly known as:  CLARITIN Take 1 tablet (10 mg total) by mouth daily.   meclizine 12.5 MG tablet Commonly known as:  ANTIVERT Take 1 tablet (12.5 mg total) by mouth 3 (three) times daily as needed for dizziness.   metFORMIN 500 MG tablet Commonly known as:  GLUCOPHAGE Take 500-1,000 mg by mouth 2 (two) times daily. Take one tablet in the morning and two tablets in the evening by mouth.   nitroGLYCERIN 0.4 MG SL tablet Commonly known as:  NITROSTAT Place 1 tablet (0.4 mg total) under the tongue every 5 (five) minutes as needed for chest pain.   predniSONE 50 MG tablet Commonly known as:  DELTASONE Take 1 tablet (50 mg total) by mouth daily. Take one tablet daily till 09/07/2016 and the tapering dose. Start taking on:  09/03/2016   predniSONE 10 MG tablet Commonly known as:  DELTASONE Take 1 tablet (10 mg total) by mouth daily with breakfast. Start on 09/08/2016 take 40 mg, then next day 30 mg, next day 20 mg and next day 10 mg and then stop. Start taking on:  09/08/2016   simvastatin 80 MG tablet Commonly known as:  ZOCOR Take 40 mg by mouth at bedtime.            Durable Medical Equipment        Start     Ordered   09/02/16 0000  For home use only DME 4 wheeled rolling walker with seat    Question:  Patient needs a walker to treat with the following condition  Answer:  Dizziness   09/02/16 1200     Follow-up Information    GREEN, EDWIN JAY, MD Follow up in 1 week(s).   Specialty:  Internal Medicine Contact information: [address] [place] [address] [phone]          No Known Allergies  Consultations: Neurology  Procedures/Studies: Echo, MRI/MRA  Subjective: Patient was seen and examined at bedside. He reported that his dizziness and vertigo significant improvement. He is able to ambulate now.  Denied headache, nausea, vomiting, chest pain or shortness of breath. He has no weakness.  Discharge Exam: Vitals:   09/02/16 0405 09/02/16 0800  BP: 129/83 (!) 160/94  Pulse: 65 65  Resp: 19 20  Temp: 98.5 F (36.9 C)    Vitals:   09/01/16 1503 09/01/16 2207 09/02/16 0405 09/02/16 0800  BP:  128/73 129/83 (!) 160/94  Pulse:  63 65 65  Resp:  Temp: 98.2 F (36.8 C) 98.8 F (37.1 C) 98.5 F (36.9 C)   TempSrc: Oral     SpO2:  97% 96% 98%  Weight:   99 kg (218 lb 3.2 oz)   Height:  General: Pt is alert, awake, not in acute distress Cardiovascular: RRR, S1/S2 +, no rubs, no gallops Respiratory: CTA bilaterally, no wheezing, no rhonchi Abdominal: Soft, NT, ND, bowel sounds + Extremities: no edema, no cyanosis, Muscle strength 5 over 5    The results of significant diagnostics from this hospitalization (including imaging, microbiology, ancillary and laboratory) are listed below for reference.     Microbiology: No results found for this or any previous visit (from the past 240 hour(s)).   Labs: BNP (last 3 results) No results for input(s): BNP in the last 8760 hours. Basic Metabolic Panel:  Recent Labs Lab 08/30/16 1900 08/31/16 0443  NA 136 137  K 4.1 3.8  CL 105 106  CO2 20* 22  GLUCOSE 225* 172*  BUN 11 10  CREATININE 1.01 0.93  CALCIUM 9.3 8.6*   Liver Function Tests:  Recent Labs Lab 08/30/16 1900 08/31/16 0443  AST 18 16  ALT 14* 13*  ALKPHOS 67 58  BILITOT 0.8 0.6  PROT 7.6 6.5  ALBUMIN 4.0 3.5   No results for input(s): LIPASE, AMYLASE in the last 168 hours. No results for input(s): AMMONIA in the last 168 hours. CBC:  Recent Labs Lab 08/30/16 1900 08/31/16 0443  WBC 11.9* 8.5  NEUTROABS 7.6  --   HGB 15.1 14.3  HCT 43.6 41.4  MCV 84.0 84.1  PLT 191 177   Cardiac Enzymes:  Recent Labs Lab 08/31/16 0443  TROPONINI <0.03   BNP: Invalid input(s): POCBNP CBG:  Recent Labs Lab 09/01/16 1109 09/01/16 1618  09/01/16 2211 09/02/16 0715 09/02/16 1105  GLUCAP 218* 237* 134* 159* 212*   D-Dimer No results for input(s): DDIMER in the last 72 hours. Hgb A1c No results for input(s): HGBA1C in the last 72 hours. Lipid Profile  Recent Labs  09/02/16 0355  CHOL 124  HDL 29*  LDLCALC 61  TRIG 433*  CHOLHDL 4.3   Thyroid function studies No results for input(s): TSH, T4TOTAL, T3FREE, THYROIDAB in the last 72 hours.  Invalid input(s): FREET3 Anemia work up No results for input(s): VITAMINB12, FOLATE, FERRITIN, TIBC, IRON, RETICCTPCT in the last 72 hours. Urinalysis    Component Value Date/Time   COLORURINE YELLOW 08/30/2016 1009   APPEARANCEUR CLEAR 08/30/2016 1009   LABSPEC 1.017 08/30/2016 1009   PHURINE 5.0 08/30/2016 1009   GLUCOSEU >=500 (A) 08/30/2016 1009   HGBUR SMALL (A) 08/30/2016 1009   BILIRUBINUR NEGATIVE 08/30/2016 1009   KETONESUR 5 (A) 08/30/2016 1009   PROTEINUR >=300 (A) 08/30/2016 1009   NITRITE NEGATIVE 08/30/2016 1009   LEUKOCYTESUR NEGATIVE 08/30/2016 1009   Sepsis Labs Invalid input(s): PROCALCITONIN,  WBC,  LACTICIDVEN Microbiology No results found for this or any previous visit (from the past 240 hour(s)).   Time coordinating discharge: 30 minutes  SIGNED:   Maxie Barb, MD  [facility] 09/02/2016, 12:01 PM  If 7PM-7AM, please contact night-coverage [email or web address] Password [email or web address]

## 2016-09-02 NOTE — Progress Notes (Signed)
Echocardiogram 2D Echocardiogram has been performed.  Dorothey Baseman 09/02/2016, 9:24 AM

## 2016-09-15 DIAGNOSIS — I10 Essential (primary) hypertension: Secondary | ICD-10-CM | POA: Diagnosis not present

## 2016-09-15 DIAGNOSIS — E119 Type 2 diabetes mellitus without complications: Secondary | ICD-10-CM | POA: Diagnosis not present

## 2016-09-15 DIAGNOSIS — I2510 Atherosclerotic heart disease of native coronary artery without angina pectoris: Secondary | ICD-10-CM | POA: Diagnosis not present

## 2016-09-15 DIAGNOSIS — H8149 Vertigo of central origin, unspecified ear: Secondary | ICD-10-CM | POA: Diagnosis not present

## 2016-09-27 ENCOUNTER — Ambulatory Visit (INDEPENDENT_AMBULATORY_CARE_PROVIDER_SITE_OTHER): Payer: Medicare Other | Admitting: Neurology

## 2016-09-27 ENCOUNTER — Encounter: Payer: Self-pay | Admitting: Neurology

## 2016-09-27 VITALS — BP 122/77 | HR 74 | Ht 70.5 in | Wt 223.0 lb

## 2016-09-27 DIAGNOSIS — R42 Dizziness and giddiness: Secondary | ICD-10-CM

## 2016-09-27 NOTE — Progress Notes (Signed)
Reason for visit: Dizziness  Referring physician: [facility]  Christopher Roberts is a 71 y.o. male  History of present illness:  Mr. Caples is a 71 year old black male with a history of a recent admission to the hospital on 08/30/2016 for significant vertigo. The patient had an upper respiratory tract infection with a cough prior to the onset of vertigo, he also had some sensation of stuffiness in the ears and sinus drainage. The patient then began to have sudden onset of vertigo associated with nausea and vomiting and unsteady gait. The patient went into the hospital, evaluation revealed a moderate level of chronic small vessel disease by MRI of the brain, but no acute changes were seen. MRA of the head and neck was done and did not show any acute blockages. The patient was placed on steroids for vestibulitis and he was placed on meclizine. These medications did help, the patient is still symptomatic but he is improving. Initially he has required a walker for ambulation, but over the last several days he has been able to walk without the walker. He has not had any falls. He denies any headache, double vision, loss of vision, syncope, or numbness or weakness of the face, arms, or legs. He denies any difficulty controlling the bowels or the bladder. He does note an increase in dizziness if he stoops over or bends over, he does not note any vertigo when rolling in bed. He is sent to this office for an evaluation. He was to be set up for vestibular rehabilitation, but this apparently never happened. He is now off of the meclizine.  Past Medical History:  Diagnosis Date  . CAD (coronary artery disease)    a. s/p MI in 2005 (symptom of "indigestion"); b. CABG x 3 in 3/09: L-LAD, S-OM, EF unknown  . Colon polyps   . Diabetes mellitus   . DJD (degenerative joint disease)   . HLD (hyperlipidemia)   . HTN (hypertension)   . MI (myocardial infarction) (HCC) 2005   manifested by indigestion  . Murmur      echo 6/12:  Upper septal thickening, no LVOT gradient,, EF 65%, mild LAE      Past Surgical History:  Procedure Laterality Date  . CHOLECYSTECTOMY  2002  . CORONARY ARTERY BYPASS GRAFT  2009   x3  . TONSILLECTOMY      Family History  Problem Relation Age of Onset  . Colon cancer Mother   . Heart attack Mother     Social history:  reports that he has been smoking Cigarettes.  He has smoked for the past 40.00 years. He has never used smokeless tobacco. He reports that he does not drink alcohol or use drugs.  Medications:  Prior to Admission medications   Medication Sig Start Date End Date Taking? Authorizing Provider  amLODipine (NORVASC) 10 MG tablet Take 1 tablet (10 mg total) by mouth daily. 09/01/16   Joseph Art, DO  aspirin 81 MG tablet Take 81 mg by mouth daily.      [provider]  carvedilol (COREG) 25 MG tablet Take 25 mg by mouth 2 (two) times daily with a meal.      [provider]  clopidogrel (PLAVIX) 75 MG tablet Take 75 mg by mouth daily.      [provider]  isosorbide mononitrate (IMDUR) 30 MG 24 hr tablet Take 1 tablet (30 mg total) by mouth daily. 10/21/12 08/30/16  Bensimhon, Bevelyn Buckles, MD  lisinopril (PRINIVIL,ZESTRIL)  10 MG tablet Take 20 mg by mouth daily.    [provider]  loratadine (CLARITIN) 10 MG tablet Take 1 tablet (10 mg total) by mouth daily. 09/01/16   Joseph ArtVann, Jessica U, DO  meclizine (ANTIVERT) 12.5 MG tablet Take 1 tablet (12.5 mg total) by mouth 3 (three) times daily as needed for dizziness. 09/02/16   Maxie BarbBhandari, Dron Prasad, MD  metFORMIN (GLUCOPHAGE) 500 MG tablet Take 500-1,000 mg by mouth 2 (two) times daily. Take one tablet in the morning and two tablets in the evening by mouth.    [provider]  nitroGLYCERIN (NITROSTAT) 0.4 MG SL tablet Place 1 tablet (0.4 mg total) under the tongue every 5 (five) minutes as needed for chest pain. 10/25/10 08/30/16  Tereso NewcomerWeaver, Scott T, PA-C  predniSONE (DELTASONE) 10  MG tablet Take 1 tablet (10 mg total) by mouth daily with breakfast. Start on 09/08/2016 take 40 mg, then next day 30 mg, next day 20 mg and next day 10 mg and then stop. 09/08/16   Maxie BarbBhandari, Dron Prasad, MD  simvastatin (ZOCOR) 80 MG tablet Take 40 mg by mouth at bedtime.     [provider]     No Known Allergies  ROS:  Out of a complete 14 system review of symptoms, the patient complains only of the following symptoms, and all other reviewed systems are negative.  Fatigue Cough, snoring Increased thirst  There were no vitals taken for this visit.  Physical Exam  General: The patient is alert and cooperative at the time of the examination. The patient is moderately obese.  Eyes: Pupils are equal, round, and reactive to light. Discs are flat bilaterally.  Ears: Tympanic membranes could not be observed on either side secondary to cerumen plugs.  Neck: The neck is supple, no carotid bruits are noted.  Respiratory: The respiratory examination is clear.  Cardiovascular: The cardiovascular examination reveals a regular rate and rhythm, no obvious murmurs or rubs are noted.  Skin: Extremities are without significant edema.  Neurologic Exam  Mental status: The patient is alert and oriented x 3 at the time of the examination. The patient has apparent normal recent and remote memory, with an apparently normal attention span and concentration ability.  Cranial nerves: Facial symmetry is present. There is good sensation of the face to pinprick and soft touch bilaterally. The strength of the facial muscles and the muscles to head turning and shoulder shrug are normal bilaterally. Speech is well enunciated, no aphasia or dysarthria is noted. Extraocular movements are full. Visual fields are full. The tongue is midline, and the patient has symmetric elevation of the soft palate. No obvious hearing deficits are noted.  Motor: The motor testing reveals 5 over 5 strength of all 4  extremities. Good symmetric motor tone is noted throughout.  Sensory: Sensory testing is intact to pinprick, soft touch, vibration sensation, and position sense on all 4 extremities, with exception of a stocking pattern pinprick sensory deficit across the ankles bilaterally. No evidence of extinction is noted.  Coordination: Cerebellar testing reveals good finger-nose-finger and heel-to-shin bilaterally. The Nyan-Barrany procedure was done, this revealed evidence of bilateral nystagmus that was worse when looking to the left with a horizontal and rotatory component.  Gait and station: Gait is wide-based, the patient can walk independently. Tandem gait is unsteady. Romberg is negative. No drift is seen.  Reflexes: Deep tendon reflexes are symmetric, but are depressed bilaterally. Toes are downgoing bilaterally.   MRI brain MRA head and neck 09/01/16:  IMPRESSION: MRI HEAD IMPRESSION:  1. Limited study demonstrating no acute cerebellar or brainstem infarct. No other definite acute intracranial abnormality. 2. 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  MRA HEAD IMPRESSION:  1. Negative intracranial MRA. No high-grade or flow-limiting stenosis. 2. Mild non flow-limiting atheromatous regularity within the carotid siphons. Anterior circulation otherwise widely patent. 3. Widely patent vertebrobasilar system. MRA NECK IMPRESSION:  1. Motion degraded study. 2. 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 Correlation with carotid Doppler ultrasound and/or CTA suggested. 3. Mild atheromatous irregularity about the left carotid bifurcation without significant stenosis. 4. Widely patent vertebral arteries.  * MRI scan images were reviewed online. I agree with the written report.   Assessment/Plan:  1.  Probable vestibulitis  2. Gait instability  The patient appears to have wax plugs in both ears, he will try to use a product such as Ceruminex to remove the wax. The patient will be set up for vestibular rehabilitation. The patient is no longer on Antivert. He will follow-up in 3 months. The patient clearly is improving gradually, but he is not yet back to baseline.  Marlan Palau MD 09/27/2016 9:31 AM  [facility] [address] [place], [place] [address]  Phone [phone] Fax [phone]

## 2016-09-27 NOTE — Patient Instructions (Signed)
   We will get physical therapy for vestibular rehab.

## 2016-11-16 ENCOUNTER — Ambulatory Visit: Payer: 59 | Attending: Neurology | Admitting: Physical Therapy

## 2016-11-16 DIAGNOSIS — H8111 Benign paroxysmal vertigo, right ear: Secondary | ICD-10-CM | POA: Diagnosis not present

## 2016-11-16 NOTE — Patient Instructions (Signed)
Sit to Side-Lying    Sit on edge of bed. 1. Turn head 45 to right. 2. Maintain head position and lie down slowly on left side. Hold until symptoms subside. 3. Sit up slowly. Hold until symptoms subside. 4. Turn head 45 to left. 5. Maintain head position and lie down slowly on right side. Hold until symptoms subside. 6. Sit up slowly. Repeat sequence __5__ times per session. Do _3___ sessions per day.  LIE DOWN ON RIGHT SIDE FIRST!!  Copyright  VHI. All rights reserved.

## 2016-11-17 NOTE — Therapy (Addendum)
[facility] [address] [place], [place], [address] Phone: [phone]   Fax:  [phone]  Physical Therapy Evaluation  Patient Details  Name: Christopher PollackLeslie H Corkern MRN: [id number] Date of Birth: 09/07/1945 Referring Provider: Dr. Lesia SagoKeith Willis  Encounter Date: 11/16/2016      PT End of Session - 11/17/16 2254    Visit Number 1   Number of Visits 4   [facility] Type [facility]   Authorization Time Period 11-16-16 - 12-17-16   PT Start Time 1403   PT Stop Time 1446   PT Time Calculation (min) 43 min      Past Medical History:  Diagnosis Date  . CAD (coronary artery disease)    a. s/p MI in 2005 (symptom of "indigestion"); b. CABG x 3 in 3/09: L-LAD, S-OM, EF unknown  . Colon polyps   . Diabetes mellitus   . DJD (degenerative joint disease)   . HLD (hyperlipidemia)   . HTN (hypertension)   . MI (myocardial infarction) (HCC) 2005   manifested by indigestion  . Murmur    echo 6/12:  Upper septal thickening, no LVOT gradient,, EF 65%, mild LAE      Past Surgical History:  Procedure Laterality Date  . CHOLECYSTECTOMY  2002  . CORONARY ARTERY BYPASS GRAFT  2009   x3  . TONSILLECTOMY      There were no vitals filed for this visit.       Subjective Assessment - 11/17/16 2254    Subjective Pt states he went to ED on 08-30-16 with vertigo and subsequent nause and vomiting.  Pt reports he feels dizziness with sit to stand and has unsteadiness with ambulation.   Patient Stated Goals Resolve the dizziness   Currently in Pain? No/denies            [facility] Assessment - 11/18/16 0001      Assessment   Medical Diagnosis Vertigo   Referring Provider Dr. Lesia SagoKeith Willis   Onset Date/Surgical Date 08/30/16     Balance Screen   Has the patient fallen in the past 6 months No   Has the patient had a decrease in activity level because of a fear of falling?  Yes   Is the patient reluctant to leave their home because of  a fear of falling?  --  somewhat     Prior Function   Level of Independence Independent            Vestibular Assessment - 11/18/16 0001      Vestibular Assessment   General Observation Pt is a 71 year old male with onset of vertigo on 08-30-16 at which time he went to ED with vertigo, nause and vomiting.  MRI revealed small vessel disease     Symptom Behavior   Type of Dizziness Imbalance  light-headedness   Frequency of Dizziness varies in occurrence   Duration of Dizziness seconds to minutes   Aggravating Factors Looking up to the ceiling;Lying supine;Rolling to right   Relieving Factors Lying supine;Comments  sitting down     Occulomotor Exam   Occulomotor Alignment Normal     Positional Testing   Dix-Hallpike Dix-Hallpike Right;Dix-Hallpike Left     Dix-Hallpike Right   Dix-Hallpike Right Duration approx. 24 secs   Dix-Hallpike Right Symptoms Upbeat, right rotatory nystagmus     Dix-Hallpike Left   Dix-Hallpike Left Duration none   Dix-Hallpike Left Symptoms No nystagmus        Objective measurements completed  on examination: See above findings.                  PT Education - 11/18/16 2110    Education provided Yes   Education Details habituation - Brandt-daroff   Person(s) Educated Patient   Methods Explanation;Demonstration;Handout   Comprehension Verbalized understanding;Returned demonstration             PT Long Term Goals - 11/18/16 2116      PT LONG TERM GOAL #1   Title Pt will have a (-) Rt Dix-Hallpike test to indicate resolution of Rt BPPV.  12-17-16   Time 4   Period Weeks   Status New     PT LONG TERM GOAL #2   Title Independent in HEP for habituation exercises and balance exercises prn.  12-17-16                Plan - 11/18/16 2111    Clinical Impression Statement Pt is a 72 yr old gentleman with signs and symptoms of Rt BPPV.  Symptoms improved after 3 reps of Epley's maneuver.   History and Personal  Factors relevant to plan of care: small vessel disease;  vertigo episode 08-30-16 necessitating ED visit   Clinical Presentation Stable   Clinical Presentation due to: Rt BPPV   Clinical Decision Making Low   Rehab Potential Good   PT Frequency 1x / week   PT Duration 4 weeks   PT Treatment/Interventions ADLs/Self Care Home Management;Gait training;Therapeutic activities;Therapeutic exercise;Balance training;Neuromuscular re-education;Vestibular;Patient/family education;Canalith Repostioning   PT Next Visit Plan reassess Rt BPPV   PT Home Exercise Plan Brandt-Daroff   Consulted and Agree with Plan of Care Patient      Patient will benefit from skilled therapeutic intervention in order to improve the following deficits and impairments:  Dizziness, Decreased balance  Visit Diagnosis: BPPV (benign paroxysmal positional vertigo), right - Plan: PT plan of care cert/re-cert     Problem List Patient Active Problem List   Diagnosis Date Noted  . Vertigo   . Hypertensive urgency 08/30/2016  . Vomiting 08/30/2016  . Type 2 diabetes mellitus without complication, without long-term current use of insulin (HCC) 08/30/2016  . Abnormal stress test 10/25/2010  . Shortness of breath 10/18/2010  . Murmur 10/18/2010  . Tobacco abuse 10/18/2010  . COLONIC POLYPS, HX OF 03/21/2010  . HYPERLIPIDEMIA 10/13/2008  . Essential hypertension 10/13/2008  . Coronary artery disease due to lipid rich plaque 10/13/2008  . CHEST PAIN-UNSPECIFIED 10/13/2008  . CHEST PAIN-PRECORDIAL 10/13/2008    Kary Kos, PT 11/18/2016, 9:27 PM  [facility] [address] [place], [place], [address] Phone: [phone]   Fax:  [phone]  Name: Christopher Roberts MRN: [id number] Date of Birth: 10-Jul-1945

## 2016-11-18 NOTE — Addendum Note (Signed)
Addended by: Althea CharonILDAY, Jahni Nazar S on: 11/18/2016 09:27 PM   Modules accepted: Orders

## 2016-11-23 ENCOUNTER — Encounter: Payer: Self-pay | Admitting: Physical Therapy

## 2017-01-03 ENCOUNTER — Ambulatory Visit: Payer: Medicare Other | Admitting: Adult Health

## 2017-01-04 ENCOUNTER — Encounter: Payer: Self-pay | Admitting: Adult Health

## 2017-02-09 ENCOUNTER — Other Ambulatory Visit: Payer: Self-pay

## 2017-02-09 NOTE — Patient Outreach (Signed)
[facility]) Care Management  02/09/2017  Christopher Roberts Oct 16, 1945 [id number]   Medication Adherence call to Mr. Christopher Roberts the reason for this call is because Mr. Huynh is showing past due under [facility]. On his Metformin 1000 mg spoke to patient he said he still has medication I ask if he was taking the medication as prescribe he said he did and he also has a pill box he does not need any medication at this time.   Lillia Abed CPhT Pharmacy Technician [facility] Management Direct Dial [phone]  Fax [phone] [email or web address]

## 2017-02-26 ENCOUNTER — Telehealth: Payer: Self-pay

## 2017-02-26 ENCOUNTER — Ambulatory Visit: Payer: Medicare Other | Admitting: Adult Health

## 2017-02-26 NOTE — Telephone Encounter (Signed)
Pt no-showed today's appt. 

## 2017-02-27 ENCOUNTER — Encounter: Payer: Self-pay | Admitting: Adult Health

## 2018-11-08 IMAGING — MR MR MRA NECK WO/W CM
8 of 12 series · 22 of 48 positions shown · IV contrast (multihance)
Comparison: None.

CLINICAL DATA: Initial evaluation for acute onset vertigo.



[Series 3: ax (id) 2 · axial · 1.0mm · 0.43mm/px · z∈[-22,+81]mm · 8 of 206 slices shown]
[im 1/206]
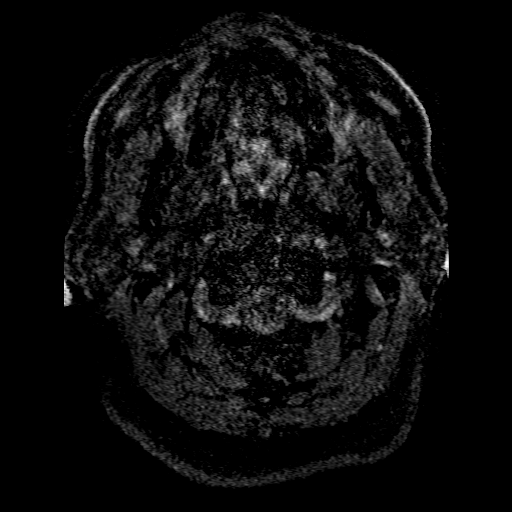
[im 26/206]
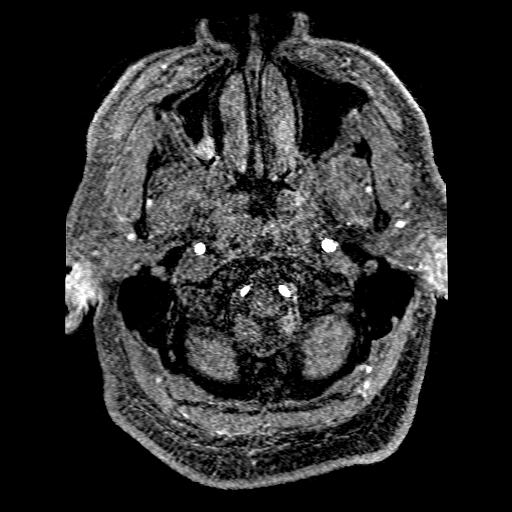
[im 52/206]
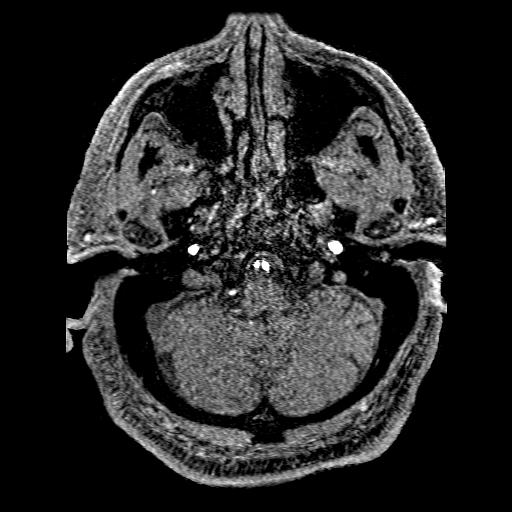
[im 77/206]
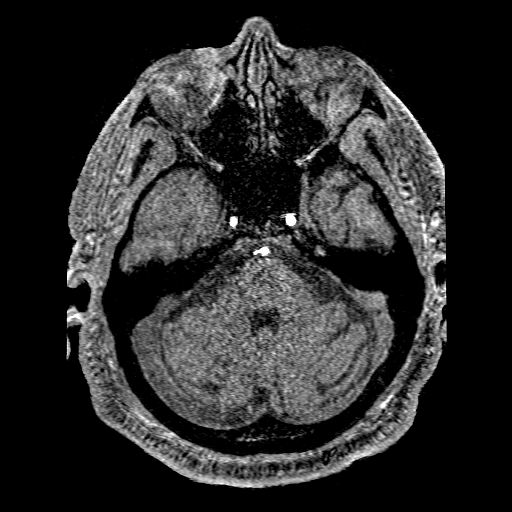
[im 129/206]
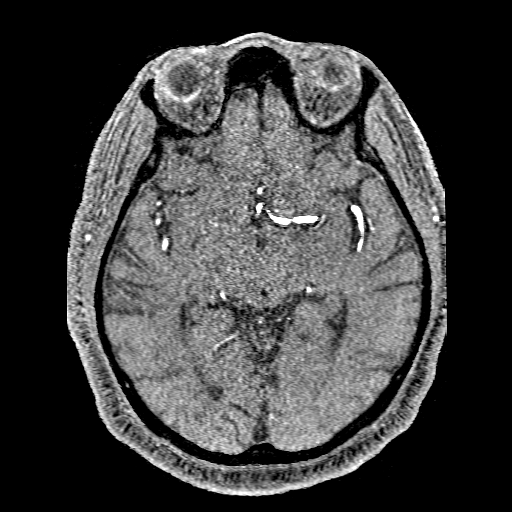
[im 154/206]
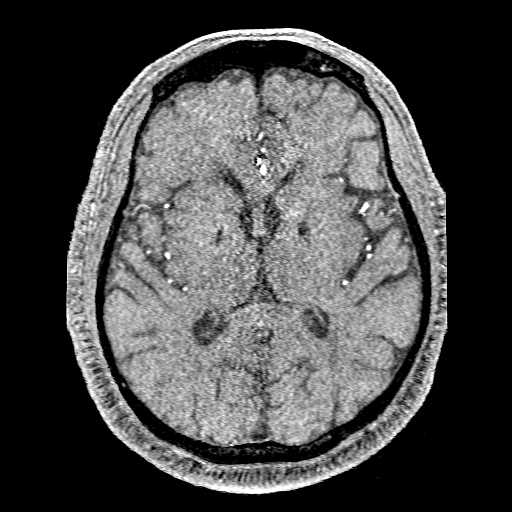
[im 180/206]
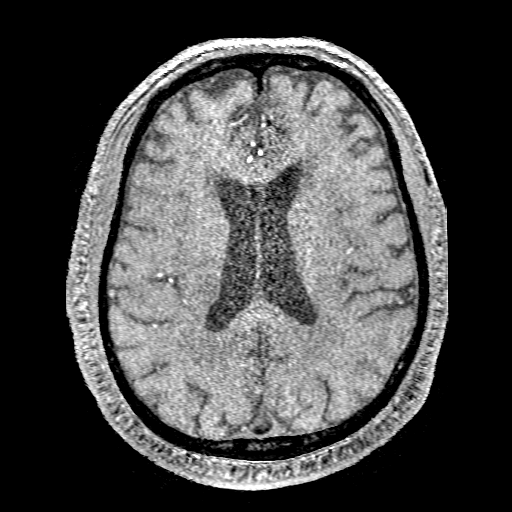
[im 206/206]
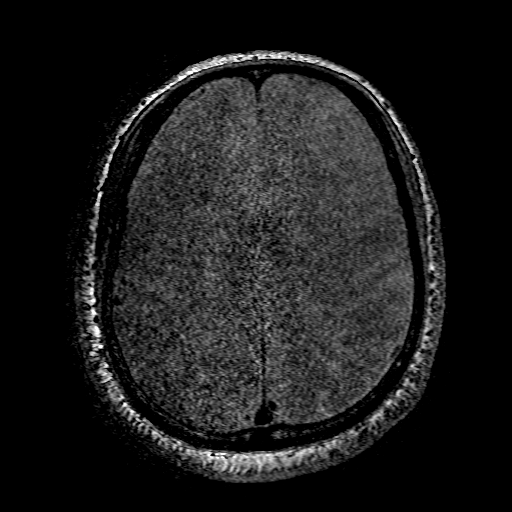

[Series 4: T2 · axial · 5.0mm · 0.47mm/px · 1 of 26 slices shown]
[im 1/26]
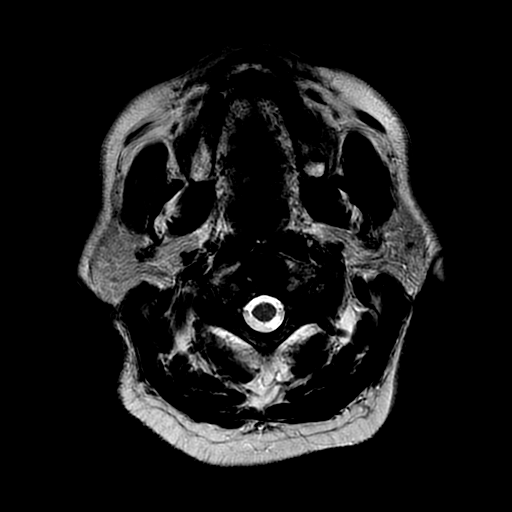

[Series 5: FLAIR · axial · 5.0mm · 0.47mm/px · 1 of 26 slices shown]
[im 1/26]
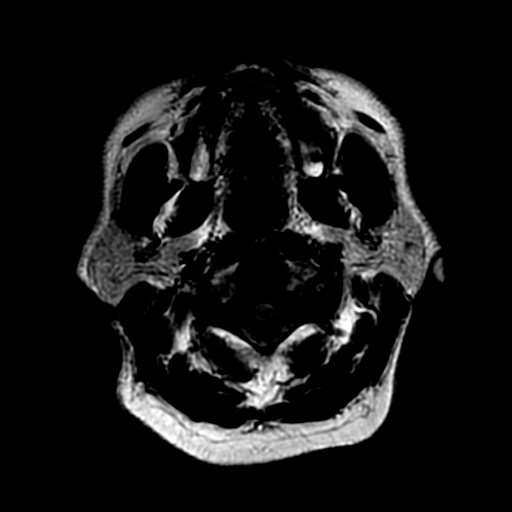

[Series 6: ax focus all · axial · 1.9mm · 0.70mm/px · z∈[-26,+48]mm · 4 of 79 slices shown]
[im 1/79]
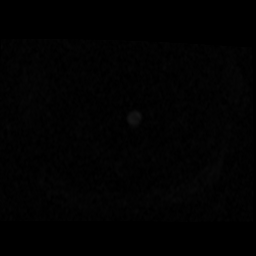
[im 27/79]
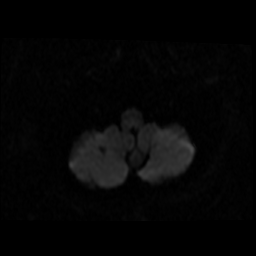
[im 53/79]
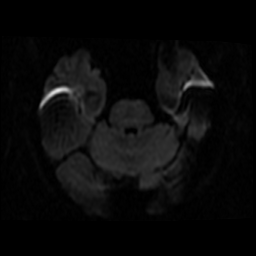
[im 79/79]
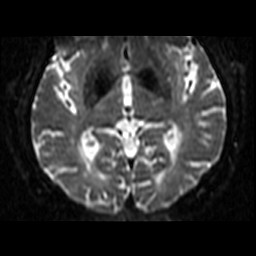

[Series 9: cor focus all · coronal · 1.9mm · 0.70mm/px · 3 of 58 slices shown]
[im 1/58]
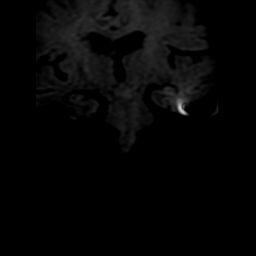
[im 29/58]
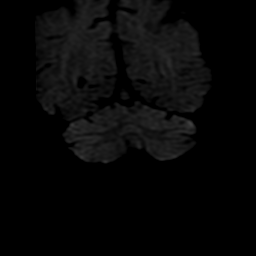
[im 58/58]
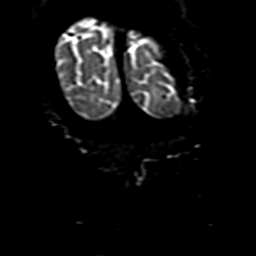

[Series 650: ADC · axial · 1.9mm · 0.70mm/px · z∈[-26,+48]mm · 2 of 36 slices shown (1 of 2)]
[im 1/36]
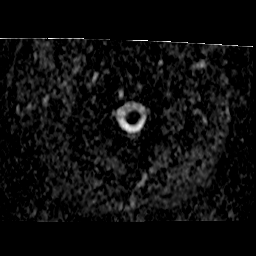
[im 36/36]
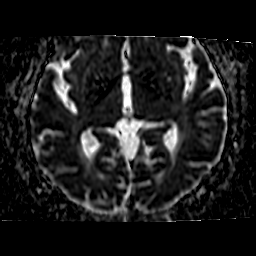

[Series 950: ADC · coronal · 1.9mm · 0.70mm/px · 1 of 18 slices shown (2 of 2)]
[im 1/18]
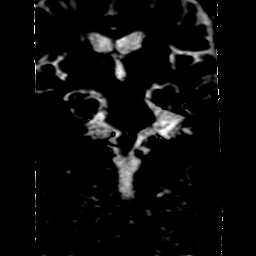

[Series 1301: ph1/cor cemra ft · coronal · 1.2mm · 0.59mm/px · 2 of 112 slices shown]
[im 1/112]
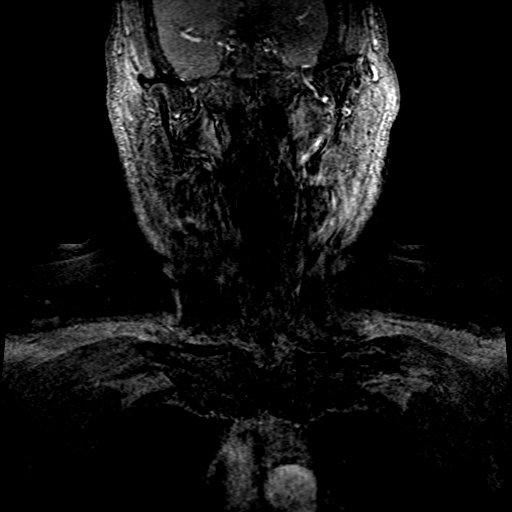
[im 28/112]
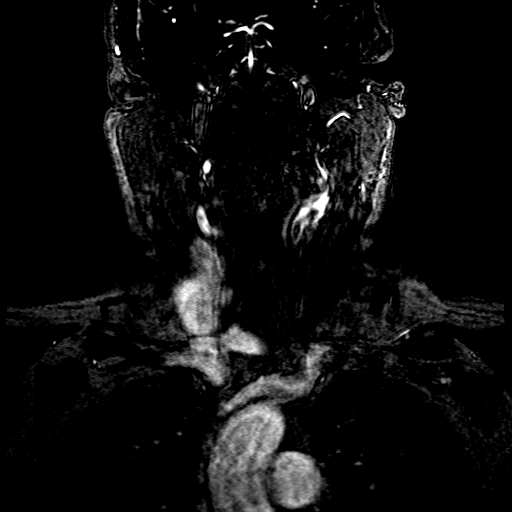

[22 of 48 positions shown; findings below may reference images not displayed]

FINDINGS: MRI HEAD FINDINGS

Eight limited MRI was performed. Axial T2 and FLAIR sequences, with
limited thin section diffusion-weighted imaging through the
cerebellum was performed.

Thin section diffusion-weighted imaging through the cerebellum and
brainstem demonstrates no evidence for acute or subacute ischemia.
No other acute infarct identified within the the limited portions of
the visualized brain.

Cerebral atrophy with chronic microvascular ischemic changes again
noted. Remote lacunar infarcts within the right corona radiata,
right basal ganglia, right thalamus, and bilateral cerebellar
hemispheres. Encephalomalacia with small amount of gliosis at the
anterior left temporal pole, suspected to be related to remote
trauma.

No mass lesion, midline shift or mass effect. Ventricles normal in
size without evidence for hydrocephalus. No extra-axial fluid
collection. Major dural sinuses are grossly patent.

Major intracranial vascular flow voids maintained.

Craniocervical junction grossly within normal limits, not well
evaluated on this limited exam.

Globes and oval soft tissues demonstrate no acute abnormality.
Patient status post lens extraction bilaterally. Axial myopia noted.

Mild scattered mucosal thickening within the paranasal sinuses. No
air-fluid level to suggest active sinus infection. No mastoid
effusion. Inner ear structures grossly normal.

MRA HEAD FINDINGS

ANTERIOR CIRCULATION:

Distal cervical segments of the internal carotid arteries are patent
with antegrade flow. Right ICA diminutive as compared to the left.
Petrous, cavernous, and supraclinoid segments patent without
flow-limiting stenosis. Mild atheromatous irregularity within the
cavernous ICAs bilaterally. Origin of the ophthalmic arteries
patent. ICA termini patent. Left A1 segment dominant and widely
patent. Right A1 segment hypoplastic and/ or absent, which likely
accounts for the diminutive right ICA. Anterior communicating artery
normal. Anterior cerebral arteries patent to their distal aspects.

M1 segments patent without stenosis or occlusion. MCA bifurcations
within normal limits. Distal MCA branches well opacified and
symmetric.

POSTERIOR CIRCULATION:

Vertebral arteries widely patent to the vertebrobasilar junction
without flow-limiting stenosis. Left vertebral artery is slightly
dominant. Right PICA is patent proximally. Left PICA not visualized.
Basilar artery widely patent to its distal aspect without
flow-limiting stenosis. No basilar tip stenosis or aneurysm.
Superior cerebral arteries patent bilaterally. Both of the posterior
cerebral artery supplied primarily via the basilar and are widely
patent to their distal aspects.

No aneurysm or vascular malformation.

MRA NECK FINDINGS

Study moderately degraded by motion artifact.

Visualized aortic arch of normal caliber with normal branch pattern.
No high-grade stenosis seen at the origin of the great vessels.
Visualized subclavian arteries widely patent.

Right common carotid artery patent from its origin to the
bifurcation without flow-limiting stenosis. There is an apparent
moderate to advanced stenosis involving the proximal right ICA
(series 4319, image 73). This is estimated at at least 50%, and may
be up to 70%, difficult to be certain on this motion degraded study.
Right ICA is widely patent distally to the circle of Willis.

Left common carotid artery patent from its origin to the
bifurcation. Mild atheromatous irregularity about the proximal left
ICA without significant flow-limiting stenosis. Left ICA patent
distally to the circle of Willis without stenosis or occlusion.

Both of the vertebral arteries arise from the subclavian arteries.
Origin of the vertebral arteries not well evaluated on this exam.
Vertebral arteries mildly tortuous but widely patent to the
vertebrobasilar junction without flow-limiting stenosis or
occlusion. Left vertebral artery slightly dominant.
IMPRESSION: MRI HEAD IMPRESSION:

1. Limited study demonstrating no acute cerebellar or brainstem
infarct. No other definite acute intracranial abnormality.
2. Stable atrophy with chronic microvascular ischemic disease, with
multiple remote lacunar and bilateral cerebellar infarcts as above.

MRA HEAD IMPRESSION:

1. Negative intracranial MRA. No high-grade or flow-limiting
stenosis.
2. Mild non flow-limiting atheromatous regularity within the carotid
siphons. Anterior circulation otherwise widely patent.
3. Widely patent vertebrobasilar system.
MRA NECK IMPRESSION:

1. Motion degraded study.
2. Moderate to advanced stenosis involving the proximal right ICA
(estimated 50-70%), somewhat poorly evaluated due to motion
artifact. Correlation with carotid Doppler ultrasound and/or CTA
suggested.
3. Mild atheromatous irregularity about the left carotid bifurcation
without significant stenosis.
4. Widely patent vertebral arteries.

## 2018-12-18 ENCOUNTER — Other Ambulatory Visit: Payer: Self-pay

## 2018-12-18 DIAGNOSIS — Z20822 Contact with and (suspected) exposure to covid-19: Secondary | ICD-10-CM

## 2018-12-19 LAB — NOVEL CORONAVIRUS, NAA: SARS-CoV-2, NAA: NOT DETECTED

## 2019-06-07 DIAGNOSIS — G8929 Other chronic pain: Secondary | ICD-10-CM | POA: Diagnosis not present

## 2019-06-07 DIAGNOSIS — Z6833 Body mass index (BMI) 33.0-33.9, adult: Secondary | ICD-10-CM | POA: Diagnosis not present

## 2019-06-07 DIAGNOSIS — I25119 Atherosclerotic heart disease of native coronary artery with unspecified angina pectoris: Secondary | ICD-10-CM | POA: Diagnosis not present

## 2019-06-07 DIAGNOSIS — E669 Obesity, unspecified: Secondary | ICD-10-CM | POA: Diagnosis not present

## 2019-06-07 DIAGNOSIS — M1990 Unspecified osteoarthritis, unspecified site: Secondary | ICD-10-CM | POA: Diagnosis not present

## 2019-06-07 DIAGNOSIS — I10 Essential (primary) hypertension: Secondary | ICD-10-CM | POA: Diagnosis not present

## 2019-06-07 DIAGNOSIS — R69 Illness, unspecified: Secondary | ICD-10-CM | POA: Diagnosis not present

## 2019-06-07 DIAGNOSIS — E785 Hyperlipidemia, unspecified: Secondary | ICD-10-CM | POA: Diagnosis not present

## 2019-06-07 DIAGNOSIS — E119 Type 2 diabetes mellitus without complications: Secondary | ICD-10-CM | POA: Diagnosis not present

## 2019-06-07 DIAGNOSIS — I252 Old myocardial infarction: Secondary | ICD-10-CM | POA: Diagnosis not present

## 2019-10-21 DIAGNOSIS — E118 Type 2 diabetes mellitus with unspecified complications: Secondary | ICD-10-CM | POA: Diagnosis not present

## 2019-10-21 DIAGNOSIS — Z720 Tobacco use: Secondary | ICD-10-CM | POA: Diagnosis not present

## 2019-10-21 DIAGNOSIS — R0600 Dyspnea, unspecified: Secondary | ICD-10-CM | POA: Diagnosis not present

## 2019-10-21 DIAGNOSIS — Z79899 Other long term (current) drug therapy: Secondary | ICD-10-CM | POA: Diagnosis not present

## 2019-10-21 DIAGNOSIS — I25708 Atherosclerosis of coronary artery bypass graft(s), unspecified, with other forms of angina pectoris: Secondary | ICD-10-CM | POA: Diagnosis not present

## 2019-10-21 DIAGNOSIS — R69 Illness, unspecified: Secondary | ICD-10-CM | POA: Diagnosis not present

## 2019-10-21 DIAGNOSIS — E7800 Pure hypercholesterolemia, unspecified: Secondary | ICD-10-CM | POA: Diagnosis not present

## 2019-10-21 DIAGNOSIS — H6123 Impacted cerumen, bilateral: Secondary | ICD-10-CM | POA: Diagnosis not present

## 2019-10-21 DIAGNOSIS — R42 Dizziness and giddiness: Secondary | ICD-10-CM | POA: Diagnosis not present

## 2019-10-21 DIAGNOSIS — I10 Essential (primary) hypertension: Secondary | ICD-10-CM | POA: Diagnosis not present

## 2019-10-24 DIAGNOSIS — E538 Deficiency of other specified B group vitamins: Secondary | ICD-10-CM | POA: Diagnosis not present

## 2019-11-03 NOTE — Progress Notes (Deleted)
Cardiology Office Note   Date:  11/03/2019   ID:  Christopher Roberts, Christopher Roberts 10-Nov-1945, MRN [id number]  PCP:  Nila Nephew, MD  Cardiologist:   No primary care provider on file. Referring:  Nila Nephew, MD  No chief complaint on file.     History of Present Illness: Christopher Roberts is a 74 y.o. male who presents for evaluation of chest pain.  He has a history of myocardial infarction status post MI in 2005.  This was followed by CABG he in 2009 in [place].  Had a stress Myoview which demonstrated an EF of 50%.  The last evaluation with [place] was 2012.  There was an EF of 65%.  There were no significant valvular abnormalities.  Cath in 2012 demonstrating an occluded RCA.  He had small native vessel diagonal disease.  He has proximal LAD stenosis.  There was 80% marginal stenosis.  Saphenous vein graft was widely patent was widely patent and apparently going to the PDA off a circ.   There appeared to be an anastomosis that was supposed to go to a RI to this graft and this appeared to be occluded.  The LIMA to the LAD was patent.  The right coronary fills with collaterals from the left.  ***  with a h/o CAD s/p MI in 2005 (manifested as indigestion) followed CABG x3 in March '09 in [place].  He also has diabetes, hyperlipidemia, previous tobacco use and hypternsion.  He was seen by Dr. Gala Romney in 6/10 to establish.  He had atypical chest pain.  A stress myoview was done and this demonstrated an EF 50%, septal dyskinesis 2/2 CABG, faint anterolateral ischemia (low risk) and medical therapy continued.  He also had a chest CT which was unremarkable (Impression:  Expected postoperative changes from median sternotomy and CABG.  No acute findings or active disease within the thorax.).  He also changed his meds to include coreg for BP.   He has not followed up and we recently received correspondence from Dr. Chilton Si.  He has had some chest pain.  He follows up today.    Past Medical History:  Diagnosis  Date  . CAD (coronary artery disease)    a. s/p MI in 2005 (symptom of "indigestion"); b. CABG x 3 in 3/09: L-LAD, S-OM, EF unknown  . Colon polyps   . Diabetes mellitus   . DJD (degenerative joint disease)   . HLD (hyperlipidemia)   . HTN (hypertension)   . MI (myocardial infarction) (HCC) 2005   manifested by indigestion  . Murmur    echo 6/12:  Upper septal thickening, no LVOT gradient,, EF 65%, mild LAE      Past Surgical History:  Procedure Laterality Date  . CHOLECYSTECTOMY  2002  . CORONARY ARTERY BYPASS GRAFT  2009   x3  . TONSILLECTOMY       Current Outpatient Medications  Medication Sig Dispense Refill  . amLODipine (NORVASC) 10 MG tablet Take 1 tablet (10 mg total) by mouth daily. 30 tablet 0  . aspirin 81 MG tablet Take 81 mg by mouth daily.      . carvedilol (COREG) 25 MG tablet Take 25 mg by mouth 2 (two) times daily with a meal.      . clopidogrel (PLAVIX) 75 MG tablet Take 75 mg by mouth daily.      . isosorbide mononitrate (IMDUR) 30 MG 24 hr tablet Take 1 tablet (30 mg total) by mouth daily. 30 tablet 3  .  lisinopril (PRINIVIL,ZESTRIL) 10 MG tablet Take 20 mg by mouth daily.    Marland Kitchen loratadine (CLARITIN) 10 MG tablet Take 1 tablet (10 mg total) by mouth daily. (Patient not taking: Reported on 11/16/2016) 30 tablet 0  . meclizine (ANTIVERT) 12.5 MG tablet Take 1 tablet (12.5 mg total) by mouth 3 (three) times daily as needed for dizziness. (Patient not taking: Reported on 11/16/2016) 15 tablet 0  . metFORMIN (GLUCOPHAGE) 500 MG tablet Take 500-1,000 mg by mouth 2 (two) times daily. Take one tablet in the morning and two tablets in the evening by mouth.    . nitroGLYCERIN (NITROSTAT) 0.4 MG SL tablet Place 1 tablet (0.4 mg total) under the tongue every 5 (five) minutes as needed for chest pain. 100 tablet 3  . predniSONE (DELTASONE) 10 MG tablet Take 1 tablet (10 mg total) by mouth daily with breakfast. Start on 09/08/2016 take 40 mg, then next day 30 mg, next day 20 mg  and next day 10 mg and then stop. (Patient not taking: Reported on 11/16/2016) 10 tablet 0  . simvastatin (ZOCOR) 80 MG tablet Take 40 mg by mouth at bedtime.      No current facility-administered medications for this visit.    Allergies:   Patient has no known allergies.    Social History:  The patient  reports that he has been smoking cigarettes. He has a 8.00 pack-year smoking history. He has never used smokeless tobacco. He reports that he does not drink alcohol and does not use drugs.   Family History:  The patient's ***family history includes Colon cancer in his mother; Diabetes in his father; Heart attack in his mother; High blood pressure in his father.    ROS:  Please see the history of present illness.   Otherwise, review of systems are positive for {NONE DEFAULTED:18576::"none"}.   All other systems are reviewed and negative.    PHYSICAL EXAM: VS:  There were no vitals taken for this visit. , BMI There is no height or weight on file to calculate BMI. GENERAL:  Well appearing HEENT:  Pupils equal round and reactive, fundi not visualized, oral mucosa unremarkable NECK:  No jugular venous distention, waveform within normal limits, carotid upstroke brisk and symmetric, no bruits, no thyromegaly LYMPHATICS:  No cervical, inguinal adenopathy LUNGS:  Clear to auscultation bilaterally BACK:  No CVA tenderness CHEST:  Unremarkable HEART:  PMI not displaced or sustained,S1 and S2 within normal limits, no S3, no S4, no clicks, no rubs, *** murmurs ABD:  Flat, positive bowel sounds normal in frequency in pitch, no bruits, no rebound, no guarding, no midline pulsatile mass, no hepatomegaly, no splenomegaly EXT:  2 plus pulses throughout, no edema, no cyanosis no clubbing SKIN:  No rashes no nodules NEURO:  Cranial nerves II through XII grossly intact, motor grossly intact throughout PSYCH:  Cognitively intact, oriented to person place and time    EKG:  EKG {ACTION; IS/IS ERD:[id number]}  ordered today. The ekg ordered today demonstrates ***   Recent Labs: No results found for requested labs within last 8760 hours.    Lipid Panel    Component Value Date/Time   CHOL 124 09/02/2016 0355   TRIG 169 (H) 09/02/2016 0355   HDL 29 (L) 09/02/2016 0355   CHOLHDL 4.3 09/02/2016 0355   VLDL 34 09/02/2016 0355   LDLCALC 61 09/02/2016 0355      Wt Readings from Last 3 Encounters:  09/27/16 223 lb (101.2 kg)  09/02/16 218 lb 3.2 oz (99  kg)  11/02/10 225 lb (102.1 kg)      Other studies Reviewed: Additional studies/ records that were reviewed today include: ***. Review of the above records demonstrates:  Please see elsewhere in the note.  ***   ASSESSMENT AND PLAN:  ***   Current medicines are reviewed at length with the patient today.  The patient {ACTIONS; HAS/DOES NOT HAVE:19233} concerns regarding medicines.  The following changes have been made:  {PLAN; NO CHANGE:13088:s}  Labs/ tests ordered today include: *** No orders of the defined types were placed in this encounter.    Disposition:   FU with ***    Signed, Minus Breeding, MD  11/03/2019 9:47 AM    [facility]

## 2019-11-04 ENCOUNTER — Ambulatory Visit: Payer: Medicare HMO | Admitting: Cardiology

## 2019-11-04 DIAGNOSIS — Z7189 Other specified counseling: Secondary | ICD-10-CM | POA: Insufficient documentation

## 2019-11-04 NOTE — Progress Notes (Deleted)
Cardiology Office Note   Date:  11/05/2019   ID:  Christopher, Roberts 1945/07/02, MRN [id number]  PCP:  Christopher Nephew, MD  Cardiologist:   No primary care provider on file. Referring:  Christopher Nephew, MD  No chief complaint on file.     History of Present Illness: Christopher Roberts is a 74 y.o. male who presents for evaluation of chest pain.  He has a history of myocardial infarction status post MI in 2005.  This was followed by CABG he in 2009 in [place].  Had a stress Myoview which demonstrated an EF of 50%.  The last evaluation with [place] was 2012.  There was an EF of 65%.  There were no significant valvular abnormalities.  Cath in 2012 demonstrating an occluded RCA.  He had small native vessel diagonal disease.  He has proximal LAD stenosis.  There was 80% marginal stenosis.  Saphenous vein graft was widely patent was widely patent and apparently going to the PDA off a circ.   There appeared to be an anastomosis that was supposed to go to a RI to this graft and this appeared to be occluded.  The LIMA to the LAD was patent.  The right coronary fills with collaterals from the left.  ***  with a h/o CAD s/p MI in 2005 (manifested as indigestion) followed CABG x3 in March '09 in [place].  He also has diabetes, hyperlipidemia, previous tobacco use and hypternsion.  He was seen by Dr. Gala Roberts in 6/10 to establish.  He had atypical chest pain.  A stress myoview was done and this demonstrated an EF 50%, septal dyskinesis 2/2 CABG, faint anterolateral ischemia (low risk) and medical therapy continued.  He also had a chest CT which was unremarkable (Impression:  Expected postoperative changes from median sternotomy and CABG.  No acute findings or active disease within the thorax.).  He also changed his meds to include coreg for BP.   He has not followed up and we recently received correspondence from Dr. Chilton Roberts.  He has had some chest pain.  He follows up today.    Past Medical History:  Diagnosis  Date  . CAD (coronary artery disease)    a. s/p MI in 2005 (symptom of "indigestion"); b. CABG x 3 in 3/09: L-LAD, S-OM, EF unknown  . Colon polyps   . Diabetes mellitus   . DJD (degenerative joint disease)   . HLD (hyperlipidemia)   . HTN (hypertension)   . MI (myocardial infarction) (HCC) 2005   manifested by indigestion  . Murmur    echo 6/12:  Upper septal thickening, no LVOT gradient,, EF 65%, mild LAE      Past Surgical History:  Procedure Laterality Date  . CHOLECYSTECTOMY  2002  . CORONARY ARTERY BYPASS GRAFT  2009   x3  . TONSILLECTOMY       Current Outpatient Medications  Medication Sig Dispense Refill  . amLODipine (NORVASC) 10 MG tablet Take 1 tablet (10 mg total) by mouth daily. 30 tablet 0  . aspirin 81 MG tablet Take 81 mg by mouth daily.      . carvedilol (COREG) 25 MG tablet Take 25 mg by mouth 2 (two) times daily with a meal.      . clopidogrel (PLAVIX) 75 MG tablet Take 75 mg by mouth daily.      . isosorbide mononitrate (IMDUR) 30 MG 24 hr tablet Take 1 tablet (30 mg total) by mouth daily. 30 tablet 3  .  lisinopril (PRINIVIL,ZESTRIL) 10 MG tablet Take 20 mg by mouth daily.    Marland Kitchen loratadine (CLARITIN) 10 MG tablet Take 1 tablet (10 mg total) by mouth daily. (Patient not taking: Reported on 11/16/2016) 30 tablet 0  . meclizine (ANTIVERT) 12.5 MG tablet Take 1 tablet (12.5 mg total) by mouth 3 (three) times daily as needed for dizziness. (Patient not taking: Reported on 11/16/2016) 15 tablet 0  . metFORMIN (GLUCOPHAGE) 500 MG tablet Take 500-1,000 mg by mouth 2 (two) times daily. Take one tablet in the morning and two tablets in the evening by mouth.    . nitroGLYCERIN (NITROSTAT) 0.4 MG SL tablet Place 1 tablet (0.4 mg total) under the tongue every 5 (five) minutes as needed for chest pain. 100 tablet 3  . predniSONE (DELTASONE) 10 MG tablet Take 1 tablet (10 mg total) by mouth daily with breakfast. Start on 09/08/2016 take 40 mg, then next day 30 mg, next day 20 mg  and next day 10 mg and then stop. (Patient not taking: Reported on 11/16/2016) 10 tablet 0  . simvastatin (ZOCOR) 80 MG tablet Take 40 mg by mouth at bedtime.      No current facility-administered medications for this visit.    Allergies:   Patient has no known allergies.    Social History:  The patient  reports that he has been smoking cigarettes. He has a 8.00 pack-year smoking history. He has never used smokeless tobacco. He reports that he does not drink alcohol and does not use drugs.   Family History:  The patient's ***family history includes Colon cancer in his mother; Diabetes in his father; Heart attack in his mother; High blood pressure in his father.    ROS:  Please see the history of present illness.   Otherwise, review of systems are positive for {NONE DEFAULTED:18576::"none"}.   All other systems are reviewed and negative.    PHYSICAL EXAM: VS:  There were no vitals taken for this visit. , BMI There is no height or weight on file to calculate BMI. GENERAL:  Well appearing HEENT:  Pupils equal round and reactive, fundi not visualized, oral mucosa unremarkable NECK:  No jugular venous distention, waveform within normal limits, carotid upstroke brisk and symmetric, no bruits, no thyromegaly LYMPHATICS:  No cervical, inguinal adenopathy LUNGS:  Clear to auscultation bilaterally BACK:  No CVA tenderness CHEST:  Unremarkable HEART:  PMI not displaced or sustained,S1 and S2 within normal limits, no S3, no S4, no clicks, no rubs, *** murmurs ABD:  Flat, positive bowel sounds normal in frequency in pitch, no bruits, no rebound, no guarding, no midline pulsatile mass, no hepatomegaly, no splenomegaly EXT:  2 plus pulses throughout, no edema, no cyanosis no clubbing SKIN:  No rashes no nodules NEURO:  Cranial nerves II through XII grossly intact, motor grossly intact throughout PSYCH:  Cognitively intact, oriented to person place and time    EKG:  EKG {ACTION; IS/IS ERD:[id number]}  ordered today. The ekg ordered today demonstrates ***   Recent Labs: No results found for requested labs within last 8760 hours.    Lipid Panel    Component Value Date/Time   CHOL 124 09/02/2016 0355   TRIG 169 (H) 09/02/2016 0355   HDL 29 (L) 09/02/2016 0355   CHOLHDL 4.3 09/02/2016 0355   VLDL 34 09/02/2016 0355   LDLCALC 61 09/02/2016 0355      Wt Readings from Last 3 Encounters:  09/27/16 223 lb (101.2 kg)  09/02/16 218 lb 3.2 oz (99  kg)  11/02/10 225 lb (102.1 kg)      Other studies Reviewed: Additional studies/ records that were reviewed today include: ***. Review of the above records demonstrates:  Please see elsewhere in the note.  ***   ASSESSMENT AND PLAN:  CAD:  ***  DM:  ***  DYSLIPIDEMIA:  ***  HTN:   ***   COVID EDUCATION:  ***   Current medicines are reviewed at length with the patient today.  The patient {ACTIONS; HAS/DOES NOT HAVE:19233} concerns regarding medicines.  The following changes have been made:  {PLAN; NO CHANGE:13088:s}  Labs/ tests ordered today include: *** No orders of the defined types were placed in this encounter.    Disposition:   FU with ***    Signed, Rollene Rotunda, MD  11/05/2019 4:16 PM    [facility]

## 2019-11-05 ENCOUNTER — Ambulatory Visit: Payer: Medicare HMO | Admitting: Cardiology

## 2019-11-06 DIAGNOSIS — E538 Deficiency of other specified B group vitamins: Secondary | ICD-10-CM | POA: Diagnosis not present

## 2019-11-06 DIAGNOSIS — E118 Type 2 diabetes mellitus with unspecified complications: Secondary | ICD-10-CM | POA: Diagnosis not present

## 2019-11-06 DIAGNOSIS — R69 Illness, unspecified: Secondary | ICD-10-CM | POA: Diagnosis not present

## 2019-12-09 DIAGNOSIS — E538 Deficiency of other specified B group vitamins: Secondary | ICD-10-CM | POA: Diagnosis not present

## 2019-12-09 DIAGNOSIS — Z7984 Long term (current) use of oral hypoglycemic drugs: Secondary | ICD-10-CM | POA: Diagnosis not present

## 2019-12-09 DIAGNOSIS — R69 Illness, unspecified: Secondary | ICD-10-CM | POA: Diagnosis not present

## 2019-12-09 DIAGNOSIS — I10 Essential (primary) hypertension: Secondary | ICD-10-CM | POA: Diagnosis not present

## 2019-12-09 DIAGNOSIS — E118 Type 2 diabetes mellitus with unspecified complications: Secondary | ICD-10-CM | POA: Diagnosis not present

## 2019-12-22 NOTE — Progress Notes (Deleted)
Cardiology Office Note   Date:  12/22/2019   ID:  Christopher, Roberts 14-Aug-1945, MRN [id number]  PCP:  Christopher Nephew, MD  Cardiologist:   No primary care provider on file. Referring:  ***  No chief complaint on file.     History of Present Illness: Christopher Roberts is a 74 y.o. male who presents for follow up of CAD.  He has not been seen in this practice for years.   ***     Past Medical History:  Diagnosis Date  . CAD (coronary artery disease)    a. s/p MI in 2005 (symptom of "indigestion"); b. CABG x 3 in 3/09: L-LAD, S-OM, EF unknown  . Colon polyps   . Diabetes mellitus   . DJD (degenerative joint disease)   . HLD (hyperlipidemia)   . HTN (hypertension)   . MI (myocardial infarction) (HCC) 2005   manifested by indigestion  . Murmur    echo 6/12:  Upper septal thickening, no LVOT gradient,, EF 65%, mild LAE      Past Surgical History:  Procedure Laterality Date  . CHOLECYSTECTOMY  2002  . CORONARY ARTERY BYPASS GRAFT  2009   x3  . TONSILLECTOMY       Current Outpatient Medications  Medication Sig Dispense Refill  . amLODipine (NORVASC) 10 MG tablet Take 1 tablet (10 mg total) by mouth daily. 30 tablet 0  . aspirin 81 MG tablet Take 81 mg by mouth daily.      . carvedilol (COREG) 25 MG tablet Take 25 mg by mouth 2 (two) times daily with a meal.      . clopidogrel (PLAVIX) 75 MG tablet Take 75 mg by mouth daily.      . isosorbide mononitrate (IMDUR) 30 MG 24 hr tablet Take 1 tablet (30 mg total) by mouth daily. 30 tablet 3  . lisinopril (PRINIVIL,ZESTRIL) 10 MG tablet Take 20 mg by mouth daily.    Marland Kitchen loratadine (CLARITIN) 10 MG tablet Take 1 tablet (10 mg total) by mouth daily. (Patient not taking: Reported on 11/16/2016) 30 tablet 0  . meclizine (ANTIVERT) 12.5 MG tablet Take 1 tablet (12.5 mg total) by mouth 3 (three) times daily as needed for dizziness. (Patient not taking: Reported on 11/16/2016) 15 tablet 0  . metFORMIN (GLUCOPHAGE) 500 MG tablet Take  500-1,000 mg by mouth 2 (two) times daily. Take one tablet in the morning and two tablets in the evening by mouth.    . nitroGLYCERIN (NITROSTAT) 0.4 MG SL tablet Place 1 tablet (0.4 mg total) under the tongue every 5 (five) minutes as needed for chest pain. 100 tablet 3  . predniSONE (DELTASONE) 10 MG tablet Take 1 tablet (10 mg total) by mouth daily with breakfast. Start on 09/08/2016 take 40 mg, then next day 30 mg, next day 20 mg and next day 10 mg and then stop. (Patient not taking: Reported on 11/16/2016) 10 tablet 0  . simvastatin (ZOCOR) 80 MG tablet Take 40 mg by mouth at bedtime.      No current facility-administered medications for this visit.    Allergies:   Patient has no known allergies.    Social History:  The patient  reports that he has been smoking cigarettes. He has a 8.00 pack-year smoking history. He has never used smokeless tobacco. He reports that he does not drink alcohol and does not use drugs.   Family History:  The patient's ***family history includes Colon cancer in his mother; Diabetes in  his father; Heart attack in his mother; High blood pressure in his father.    ROS:  Please see the history of present illness.   Otherwise, review of systems are positive for {NONE DEFAULTED:18576::"none"}.   All other systems are reviewed and negative.    PHYSICAL EXAM: VS:  There were no vitals taken for this visit. , BMI There is no height or weight on file to calculate BMI. GENERAL:  Well appearing HEENT:  Pupils equal round and reactive, fundi not visualized, oral mucosa unremarkable NECK:  No jugular venous distention, waveform within normal limits, carotid upstroke brisk and symmetric, no bruits, no thyromegaly LYMPHATICS:  No cervical, inguinal adenopathy LUNGS:  Clear to auscultation bilaterally BACK:  No CVA tenderness CHEST:  Unremarkable HEART:  PMI not displaced or sustained,S1 and S2 within normal limits, no S3, no S4, no clicks, no rubs, *** murmurs ABD:  Flat,  positive bowel sounds normal in frequency in pitch, no bruits, no rebound, no guarding, no midline pulsatile mass, no hepatomegaly, no splenomegaly EXT:  2 plus pulses throughout, no edema, no cyanosis no clubbing SKIN:  No rashes no nodules NEURO:  Cranial nerves II through XII grossly intact, motor grossly intact throughout PSYCH:  Cognitively intact, oriented to person place and time    EKG:  EKG {ACTION; IS/IS MVH:[id number]} ordered today. The ekg ordered today demonstrates ***   Recent Labs: No results found for requested labs within last 8760 hours.    Lipid Panel    Component Value Date/Time   CHOL 124 09/02/2016 0355   TRIG 169 (H) 09/02/2016 0355   HDL 29 (L) 09/02/2016 0355   CHOLHDL 4.3 09/02/2016 0355   VLDL 34 09/02/2016 0355   LDLCALC 61 09/02/2016 0355      Wt Readings from Last 3 Encounters:  09/27/16 223 lb (101.2 kg)  09/02/16 218 lb 3.2 oz (99 kg)  11/02/10 225 lb (102.1 kg)      Other studies Reviewed: Additional studies/ records that were reviewed today include: ***. Review of the above records demonstrates:  Please see elsewhere in the note.  ***   ASSESSMENT AND PLAN:  CAD:  ***  HTN:  ***  DYSLIPIDEMIA:  ***   COVID EDUCATION:  ***    Current medicines are reviewed at length with the patient today.  The patient {ACTIONS; HAS/DOES NOT HAVE:19233} concerns regarding medicines.  The following changes have been made:  {PLAN; NO CHANGE:13088:s}  Labs/ tests ordered today include: *** No orders of the defined types were placed in this encounter.    Disposition:   FU with ***    Signed, Christopher Rotunda, MD  12/22/2019 9:56 PM    [facility]

## 2019-12-23 ENCOUNTER — Ambulatory Visit: Payer: Medicare HMO | Admitting: Cardiology

## 2019-12-23 DIAGNOSIS — E785 Hyperlipidemia, unspecified: Secondary | ICD-10-CM

## 2019-12-23 DIAGNOSIS — Z7189 Other specified counseling: Secondary | ICD-10-CM

## 2019-12-23 DIAGNOSIS — I10 Essential (primary) hypertension: Secondary | ICD-10-CM

## 2019-12-23 DIAGNOSIS — I2510 Atherosclerotic heart disease of native coronary artery without angina pectoris: Secondary | ICD-10-CM

## 2020-01-19 DIAGNOSIS — R69 Illness, unspecified: Secondary | ICD-10-CM | POA: Diagnosis not present

## 2020-07-25 ENCOUNTER — Inpatient Hospital Stay (HOSPITAL_COMMUNITY)
Admission: EM | Admit: 2020-07-25 | Discharge: 2020-07-27 | DRG: 250 | Disposition: A | Discharge status: Home / Self Care | Payer: Medicare HMO | Attending: Cardiology | Admitting: Cardiology

## 2020-07-25 ENCOUNTER — Encounter (HOSPITAL_COMMUNITY): Payer: Self-pay | Admitting: Emergency Medicine

## 2020-07-25 ENCOUNTER — Other Ambulatory Visit: Payer: Self-pay

## 2020-07-25 ENCOUNTER — Inpatient Hospital Stay (HOSPITAL_COMMUNITY)
Admission: EM | Disposition: A | Discharge status: Home / Self Care | Payer: Self-pay | Source: Home / Self Care | Attending: Cardiology

## 2020-07-25 DIAGNOSIS — Z9119 Patient's noncompliance with other medical treatment and regimen: Secondary | ICD-10-CM | POA: Diagnosis not present

## 2020-07-25 DIAGNOSIS — Z833 Family history of diabetes mellitus: Secondary | ICD-10-CM | POA: Diagnosis not present

## 2020-07-25 DIAGNOSIS — Z9049 Acquired absence of other specified parts of digestive tract: Secondary | ICD-10-CM | POA: Diagnosis not present

## 2020-07-25 DIAGNOSIS — I10 Essential (primary) hypertension: Secondary | ICD-10-CM | POA: Diagnosis present

## 2020-07-25 DIAGNOSIS — F32A Depression, unspecified: Secondary | ICD-10-CM | POA: Diagnosis present

## 2020-07-25 DIAGNOSIS — E7800 Pure hypercholesterolemia, unspecified: Secondary | ICD-10-CM

## 2020-07-25 DIAGNOSIS — I493 Ventricular premature depolarization: Secondary | ICD-10-CM | POA: Diagnosis present

## 2020-07-25 DIAGNOSIS — Z7984 Long term (current) use of oral hypoglycemic drugs: Secondary | ICD-10-CM | POA: Diagnosis not present

## 2020-07-25 DIAGNOSIS — Z800 Family history of malignant neoplasm of digestive organs: Secondary | ICD-10-CM

## 2020-07-25 DIAGNOSIS — U071 COVID-19: Secondary | ICD-10-CM | POA: Diagnosis not present

## 2020-07-25 DIAGNOSIS — Z8719 Personal history of other diseases of the digestive system: Secondary | ICD-10-CM | POA: Diagnosis not present

## 2020-07-25 DIAGNOSIS — I2119 ST elevation (STEMI) myocardial infarction involving other coronary artery of inferior wall: Secondary | ICD-10-CM | POA: Diagnosis not present

## 2020-07-25 DIAGNOSIS — E108 Type 1 diabetes mellitus with unspecified complications: Secondary | ICD-10-CM

## 2020-07-25 DIAGNOSIS — Z716 Tobacco abuse counseling: Secondary | ICD-10-CM | POA: Diagnosis not present

## 2020-07-25 DIAGNOSIS — E119 Type 2 diabetes mellitus without complications: Secondary | ICD-10-CM

## 2020-07-25 DIAGNOSIS — I2111 ST elevation (STEMI) myocardial infarction involving right coronary artery: Secondary | ICD-10-CM | POA: Diagnosis present

## 2020-07-25 DIAGNOSIS — I25810 Atherosclerosis of coronary artery bypass graft(s) without angina pectoris: Secondary | ICD-10-CM | POA: Diagnosis present

## 2020-07-25 DIAGNOSIS — I252 Old myocardial infarction: Secondary | ICD-10-CM

## 2020-07-25 DIAGNOSIS — Z79899 Other long term (current) drug therapy: Secondary | ICD-10-CM

## 2020-07-25 DIAGNOSIS — Z7982 Long term (current) use of aspirin: Secondary | ICD-10-CM | POA: Diagnosis not present

## 2020-07-25 DIAGNOSIS — I2510 Atherosclerotic heart disease of native coronary artery without angina pectoris: Secondary | ICD-10-CM

## 2020-07-25 DIAGNOSIS — E1169 Type 2 diabetes mellitus with other specified complication: Secondary | ICD-10-CM | POA: Diagnosis present

## 2020-07-25 DIAGNOSIS — Z720 Tobacco use: Secondary | ICD-10-CM | POA: Diagnosis not present

## 2020-07-25 DIAGNOSIS — E785 Hyperlipidemia, unspecified: Secondary | ICD-10-CM | POA: Diagnosis present

## 2020-07-25 DIAGNOSIS — R69 Illness, unspecified: Secondary | ICD-10-CM | POA: Diagnosis not present

## 2020-07-25 DIAGNOSIS — F17210 Nicotine dependence, cigarettes, uncomplicated: Secondary | ICD-10-CM | POA: Diagnosis present

## 2020-07-25 DIAGNOSIS — Z7189 Other specified counseling: Secondary | ICD-10-CM

## 2020-07-25 DIAGNOSIS — Z8249 Family history of ischemic heart disease and other diseases of the circulatory system: Secondary | ICD-10-CM | POA: Diagnosis not present

## 2020-07-25 HISTORY — PX: CORONARY/GRAFT ACUTE MI REVASCULARIZATION: CATH118305

## 2020-07-25 HISTORY — PX: LEFT HEART CATH AND CORS/GRAFTS ANGIOGRAPHY: CATH118250

## 2020-07-25 LAB — BASIC METABOLIC PANEL
Anion gap: 9 (ref 5–15)
BUN: 10 mg/dL (ref 8–23)
CO2: 21 mmol/L — ABNORMAL LOW (ref 22–32)
Calcium: 9 mg/dL (ref 8.9–10.3)
Chloride: 108 mmol/L (ref 98–111)
Creatinine, Ser: 1.11 mg/dL (ref 0.61–1.24)
GFR, Estimated: >60 mL/min (ref >60)
Glucose, Bld: 134 mg/dL — ABNORMAL HIGH (ref 70–99)
Potassium: 4.1 mmol/L (ref 3.5–5.1)
Sodium: 138 mmol/L (ref 135–145)

## 2020-07-25 LAB — CBC
HCT: 45.1 % (ref 39.0–52.0)
Hemoglobin: 14.7 g/dL (ref 13.0–17.0)
MCH: 28.2 pg (ref 26.0–34.0)
MCHC: 32.6 g/dL (ref 30.0–36.0)
MCV: 86.4 fL (ref 80.0–100.0)
Platelets: 154 10*3/uL (ref 150–400)
RBC: 5.22 MIL/uL (ref 4.22–5.81)
RDW: 14.2 % (ref 11.5–15.5)
WBC: 8 10*3/uL (ref 4.0–10.5)
nRBC: 0 % (ref 0.0–0.2)

## 2020-07-25 LAB — PROTIME-INR
INR: 1.1 (ref 0.8–1.2)
Prothrombin Time: 13.3 seconds (ref 11.4–15.2)

## 2020-07-25 LAB — RESP PANEL BY RT-PCR (FLU A&B, COVID) ARPGX2
Influenza A by PCR: NEGATIVE
Influenza B by PCR: NEGATIVE
SARS Coronavirus 2 by RT PCR: POSITIVE — AB

## 2020-07-25 LAB — APTT: aPTT: 20 seconds — ABNORMAL LOW (ref 24–36)

## 2020-07-25 LAB — LIPID PANEL
Cholesterol: 233 mg/dL — ABNORMAL HIGH (ref 0–200)
HDL: 51 mg/dL (ref >40)
LDL Cholesterol: 162 mg/dL — ABNORMAL HIGH (ref 0–99)
Total CHOL/HDL Ratio: 4.6 RATIO
Triglycerides: 101 mg/dL (ref <150)
VLDL: 20 mg/dL (ref 0–40)

## 2020-07-25 LAB — MRSA PCR SCREENING: MRSA by PCR: NEGATIVE

## 2020-07-25 LAB — TROPONIN I (HIGH SENSITIVITY)
Troponin I (High Sensitivity): 771 ng/L (ref <18)
Troponin I (High Sensitivity): 1360 ng/L (ref <18)

## 2020-07-25 LAB — HEMOGLOBIN A1C
Hgb A1c MFr Bld: 8 % — ABNORMAL HIGH (ref 4.8–5.6)
Mean Plasma Glucose: 182.9 mg/dL

## 2020-07-25 LAB — GLUCOSE, CAPILLARY: Glucose-Capillary: 161 mg/dL — ABNORMAL HIGH (ref 70–99)

## 2020-07-25 LAB — POCT ACTIVATED CLOTTING TIME
Activated Clotting Time: 499 seconds
Activated Clotting Time: 202 seconds
Activated Clotting Time: 166 seconds
Activated Clotting Time: 148 seconds

## 2020-07-25 SURGERY — CORONARY/GRAFT ACUTE MI REVASCULARIZATION
Anesthesia: LOCAL

## 2020-07-25 MED ORDER — VERAPAMIL HCL 2.5 MG/ML IV SOLN
INTRAVENOUS | Status: AC
  Filled 2020-07-25: qty 2

## 2020-07-25 MED ORDER — SODIUM CHLORIDE 0.9 % IV SOLN: 250.0000 mL | INTRAVENOUS | Status: DC | PRN

## 2020-07-25 MED ORDER — NITROGLYCERIN 0.4 MG SL SUBL
0.4000 mg | SUBLINGUAL_TABLET | SUBLINGUAL | Status: DC | PRN
  Administered 2020-07-25: 0.4 mg via SUBLINGUAL
  Filled 2020-07-25: qty 1

## 2020-07-25 MED ORDER — CLOPIDOGREL BISULFATE 75 MG PO TABS
75.0000 mg | ORAL_TABLET | Freq: Every day | ORAL | Status: DC
  Administered 2020-07-26 – 2020-07-27 (×2): 75 mg via ORAL
  Filled 2020-07-25 (×2): qty 1

## 2020-07-25 MED ORDER — HEPARIN SODIUM (PORCINE) 1000 UNIT/ML IJ SOLN
INTRAMUSCULAR | Status: DC | PRN
  Administered 2020-07-25: 9000 [IU] via INTRAVENOUS

## 2020-07-25 MED ORDER — AMLODIPINE BESYLATE 5 MG PO TABS
5.0000 mg | ORAL_TABLET | Freq: Every day | ORAL | Status: DC
  Administered 2020-07-25: 5 mg via ORAL
  Filled 2020-07-25 (×2): qty 1

## 2020-07-25 MED ORDER — INSULIN ASPART 100 UNIT/ML ~~LOC~~ SOLN
0.0000 [IU] | Freq: Three times a day (TID) | SUBCUTANEOUS | Status: DC
  Administered 2020-07-26 – 2020-07-27 (×2): 3 [IU] via SUBCUTANEOUS

## 2020-07-25 MED ORDER — HEPARIN SODIUM (PORCINE) 5000 UNIT/ML IJ SOLN
INTRAMUSCULAR | Status: AC
  Filled 2020-07-25: qty 1

## 2020-07-25 MED ORDER — ASPIRIN 81 MG PO CHEW
324.0000 mg | CHEWABLE_TABLET | Freq: Once | ORAL | Status: AC
  Administered 2020-07-25: 324 mg via ORAL

## 2020-07-25 MED ORDER — HEPARIN SODIUM (PORCINE) 5000 UNIT/ML IJ SOLN
5000.0000 [IU] | Freq: Three times a day (TID) | INTRAMUSCULAR | Status: DC
Start: 2020-07-26 — End: 2020-07-25

## 2020-07-25 MED ORDER — ASPIRIN 81 MG PO CHEW
81.0000 mg | CHEWABLE_TABLET | Freq: Every day | ORAL | Status: DC
  Administered 2020-07-26 – 2020-07-27 (×2): 81 mg via ORAL
  Filled 2020-07-25 (×3): qty 1

## 2020-07-25 MED ORDER — HEPARIN SODIUM (PORCINE) 5000 UNIT/ML IJ SOLN
5000.0000 [IU] | Freq: Three times a day (TID) | INTRAMUSCULAR | Status: DC
  Administered 2020-07-26 – 2020-07-27 (×4): 5000 [IU] via SUBCUTANEOUS
  Filled 2020-07-25 (×4): qty 1

## 2020-07-25 MED ORDER — HEPARIN (PORCINE) IN NACL 1000-0.9 UT/500ML-% IV SOLN
INTRAVENOUS | Status: AC
  Filled 2020-07-25: qty 1000

## 2020-07-25 MED ORDER — HEPARIN (PORCINE) IN NACL 1000-0.9 UT/500ML-% IV SOLN
INTRAVENOUS | Status: AC
  Filled 2020-07-25: qty 500

## 2020-07-25 MED ORDER — LABETALOL HCL 5 MG/ML IV SOLN: 10.0000 mg | INTRAVENOUS | Status: AC | PRN

## 2020-07-25 MED ORDER — NITROGLYCERIN 1 MG/10 ML FOR IR/CATH LAB
INTRA_ARTERIAL | Status: AC
  Filled 2020-07-25: qty 10

## 2020-07-25 MED ORDER — TICAGRELOR 90 MG PO TABS
ORAL_TABLET | ORAL | Status: DC | PRN
  Administered 2020-07-25: 180 mg via ORAL

## 2020-07-25 MED ORDER — ONDANSETRON HCL 4 MG/2ML IJ SOLN: 4.0000 mg | Freq: Four times a day (QID) | INTRAMUSCULAR | Status: DC | PRN

## 2020-07-25 MED ORDER — SODIUM CHLORIDE 0.9 % IV SOLN: INTRAVENOUS | Status: DC

## 2020-07-25 MED ORDER — METOPROLOL TARTRATE 5 MG/5ML IV SOLN
5.0000 mg | Freq: Once | INTRAVENOUS | Status: AC
  Administered 2020-07-25: 5 mg via INTRAVENOUS
  Filled 2020-07-25: qty 5

## 2020-07-25 MED ORDER — TIROFIBAN HCL IN NACL 5-0.9 MG/100ML-% IV SOLN
INTRAVENOUS | Status: DC | PRN
  Administered 2020-07-25: 0.15 ug/kg/min via INTRAVENOUS

## 2020-07-25 MED ORDER — HYDRALAZINE HCL 20 MG/ML IJ SOLN
INTRAMUSCULAR | Status: AC
  Filled 2020-07-25: qty 1

## 2020-07-25 MED ORDER — NITROGLYCERIN IN D5W 200-5 MCG/ML-% IV SOLN
0.0000 ug/min | INTRAVENOUS | Status: DC
  Administered 2020-07-25: 5 ug/min via INTRAVENOUS
  Filled 2020-07-25: qty 250

## 2020-07-25 MED ORDER — CARVEDILOL 12.5 MG PO TABS
12.5000 mg | ORAL_TABLET | Freq: Two times a day (BID) | ORAL | Status: DC
  Administered 2020-07-26 – 2020-07-27 (×3): 12.5 mg via ORAL
  Filled 2020-07-25 (×3): qty 1

## 2020-07-25 MED ORDER — IOHEXOL 350 MG/ML SOLN
INTRAVENOUS | Status: DC | PRN
  Administered 2020-07-25: 190 mL

## 2020-07-25 MED ORDER — IOHEXOL 350 MG/ML SOLN
INTRAVENOUS | Status: AC
  Filled 2020-07-25: qty 1

## 2020-07-25 MED ORDER — NITROGLYCERIN IN D5W 200-5 MCG/ML-% IV SOLN
INTRAVENOUS | Status: AC
  Filled 2020-07-25: qty 250

## 2020-07-25 MED ORDER — ASPIRIN 81 MG PO CHEW
CHEWABLE_TABLET | ORAL | Status: AC
  Filled 2020-07-25: qty 4

## 2020-07-25 MED ORDER — TIROFIBAN HCL IN NACL 5-0.9 MG/100ML-% IV SOLN
INTRAVENOUS | Status: AC
  Filled 2020-07-25: qty 100

## 2020-07-25 MED ORDER — TICAGRELOR 90 MG PO TABS
ORAL_TABLET | ORAL | Status: AC
  Filled 2020-07-25: qty 2

## 2020-07-25 MED ORDER — NITROGLYCERIN 1 MG/10 ML FOR IR/CATH LAB
INTRA_ARTERIAL | Status: DC | PRN
  Administered 2020-07-25 (×3): 200 ug via INTRACORONARY

## 2020-07-25 MED ORDER — HEPARIN SODIUM (PORCINE) 5000 UNIT/ML IJ SOLN
60.0000 [IU]/kg | Freq: Once | INTRAMUSCULAR | Status: AC
  Administered 2020-07-25: 4000 [IU] via INTRAVENOUS

## 2020-07-25 MED ORDER — LIDOCAINE HCL (PF) 1 % IJ SOLN
INTRAMUSCULAR | Status: AC
  Filled 2020-07-25: qty 30

## 2020-07-25 MED ORDER — HYDRALAZINE HCL 20 MG/ML IJ SOLN
10.0000 mg | INTRAMUSCULAR | Status: AC | PRN
  Administered 2020-07-25: 10 mg via INTRAVENOUS

## 2020-07-25 MED ORDER — SODIUM CHLORIDE 0.9 % WEIGHT BASED INFUSION
1.0000 mL/kg/h | INTRAVENOUS | Status: AC
  Administered 2020-07-25: 1 mL/kg/h via INTRAVENOUS

## 2020-07-25 MED ORDER — FENTANYL CITRATE (PF) 100 MCG/2ML IJ SOLN
INTRAMUSCULAR | Status: AC
  Filled 2020-07-25: qty 2

## 2020-07-25 MED ORDER — TIROFIBAN (AGGRASTAT) BOLUS VIA INFUSION
INTRAVENOUS | Status: DC | PRN
  Administered 2020-07-25: 2437.5 ug via INTRAVENOUS

## 2020-07-25 MED ORDER — MIDAZOLAM HCL 2 MG/2ML IJ SOLN
INTRAMUSCULAR | Status: AC
  Filled 2020-07-25: qty 2

## 2020-07-25 MED ORDER — ATORVASTATIN CALCIUM 80 MG PO TABS
80.0000 mg | ORAL_TABLET | Freq: Every day | ORAL | Status: DC
  Administered 2020-07-25 – 2020-07-26 (×2): 80 mg via ORAL
  Filled 2020-07-25 (×2): qty 1

## 2020-07-25 MED ORDER — SODIUM CHLORIDE 0.9% FLUSH
3.0000 mL | INTRAVENOUS | Status: DC | PRN
Start: 2020-07-26 — End: 2020-07-27

## 2020-07-25 MED ORDER — SODIUM CHLORIDE 0.9% FLUSH
3.0000 mL | Freq: Two times a day (BID) | INTRAVENOUS | Status: DC
  Administered 2020-07-26 – 2020-07-27 (×3): 3 mL via INTRAVENOUS

## 2020-07-25 MED ORDER — ACETAMINOPHEN 325 MG PO TABS: 650.0000 mg | ORAL_TABLET | ORAL | Status: DC | PRN

## 2020-07-25 MED ORDER — HEPARIN (PORCINE) IN NACL 1000-0.9 UT/500ML-% IV SOLN
INTRAVENOUS | Status: DC | PRN
  Administered 2020-07-25 (×2): 500 mL

## 2020-07-25 MED ORDER — LIDOCAINE HCL (PF) 1 % IJ SOLN
INTRAMUSCULAR | Status: DC | PRN
  Administered 2020-07-25: 15 mL

## 2020-07-25 SURGICAL SUPPLY — 22 items
BALLN SAPPHIRE 2.5X20 (BALLOONS) ×2
BALLN SAPPHIRE 3.0X20 (BALLOONS) ×4
BALLOON SAPPHIRE 2.5X20 (BALLOONS) ×1 IMPLANT
BALLOON SAPPHIRE 3.0X20 (BALLOONS) ×2 IMPLANT
CATH EXTRAC PRONTO 5.5F 138CM (CATHETERS) ×2 IMPLANT
CATH EXTRAC PRONTO LP 6F RND (CATHETERS) ×2 IMPLANT
CATH INFINITI 5 FR IM (CATHETERS) ×2 IMPLANT
CATH INFINITI 5FR MULTPACK ANG (CATHETERS) ×2 IMPLANT
CATH VISTA GUIDE 6FR AL1 (CATHETERS) ×2 IMPLANT
GLIDESHEATH SLEND SS 6F .021 (SHEATH) ×2 IMPLANT
GUIDEWIRE INQWIRE 1.5J.035X260 (WIRE) ×1 IMPLANT
INQWIRE 1.5J .035X260CM (WIRE) ×2
KIT ENCORE 26 ADVANTAGE (KITS) ×2 IMPLANT
KIT ESSENTIALS PG (KITS) ×2 IMPLANT
KIT HEART LEFT (KITS) ×2 IMPLANT
PACK CARDIAC CATHETERIZATION (CUSTOM PROCEDURE TRAY) ×2 IMPLANT
SHEATH PINNACLE 6F 10CM (SHEATH) ×2 IMPLANT
SHEATH PROBE COVER 6X72 (BAG) ×2 IMPLANT
TRANSDUCER W/STOPCOCK (MISCELLANEOUS) ×2 IMPLANT
TUBING CIL FLEX 10 FLL-RA (TUBING) ×2 IMPLANT
WIRE ASAHI PROWATER 180CM (WIRE) ×2 IMPLANT
WIRE EMERALD 3MM-J .035X150CM (WIRE) ×2 IMPLANT

## 2020-07-25 NOTE — ED Notes (Signed)
Pt dressed in gown and all belongings ( shirt, pant,shoes, and wallet) placed in belongings bag with pt labels.

## 2020-07-25 NOTE — ED Triage Notes (Signed)
C/o chest pain since 9am that started while watching grandkids play.  Denies SOB, nausea, and vomiting.  States "it feels like angina".  Pt talking on phone.  EKG showed STEMI.  STEMI activated and pt to treatment room.

## 2020-07-25 NOTE — Progress Notes (Signed)
Called at bedside, pt complaining of chest pain, tachycardia on tele and tachypnic. Cardiologist notified, received order for stat IV  nitro and metoprolol.

## 2020-07-25 NOTE — H&P (Signed)
Cardiology Admission History and Physical:   Patient ID: Christopher Roberts MRN: [id number]; DOB: 1945-12-17   Admission date: 07/25/2020  PCP:  Nila Nephew, MD   [facility]  Cardiologist:  No primary care provider on file. new Advanced Practice Provider:  No care team member to display Electrophysiologist:  None     Chief Complaint:  Chest pain  Patient Profile:   RONDEY Roberts is a 75 y.o. male with history of CAD s/p CABG in 2006 presents with inferior STEMI  History of Present Illness:   [name] is a 75 yo BM with history of MI and CABG in 2006 in [place]. Was last seen by cardiology here in 2012 at which time he had an abnormal stress test. He underwent cardiac cath showing patent LIMA to the LAD and SVG to a PDA. The native LCX was occluded in the mid vessel and the RCA was occluded. There were collaterals to the PL branches. He has been lost to follow up. Reports he hasn't taken any medication for over a month. Has DM, HTN, HLD. Continues to smoke. According to his wife he sits on the couch all day. Apparently multiple family members have been sick recently. He denies any fever, cough, sore throat. Today at 9 am he developed mid sternal chest pain without radiation. No dyspnea, diaphoresis, N/V. Ecg showed new ST elevation in leads 2,3 AVF c/w inferior STEMI and Code STEMI was activated. He continues to have modest pain.   Past Medical History:  Diagnosis Date  . CAD (coronary artery disease)    a. s/p MI in 2005 (symptom of "indigestion"); b. CABG x 3 in 3/09: L-LAD, S-OM, EF unknown  . Colon polyps   . Diabetes mellitus   . DJD (degenerative joint disease)   . HLD (hyperlipidemia)   . HTN (hypertension)   . MI (myocardial infarction) (HCC) 2005   manifested by indigestion  . Murmur    echo 6/12:  Upper septal thickening, no LVOT gradient,, EF 65%, mild LAE      Past Surgical History:  Procedure Laterality Date  . CHOLECYSTECTOMY  2002  .  CORONARY ARTERY BYPASS GRAFT  2009   x3  . TONSILLECTOMY       Medications Prior to Admission: Patient has been off all medication  Allergies:   No Known Allergies  Social History:   Social History   Socioeconomic History  . Marital status: Legally Separated    Spouse name: Paulette  . Number of children: 2  . Years of education: 56  . Highest education level: Not on file  Occupational History  . Occupation: Retired  Tobacco Use  . Smoking status: Current Every Day Smoker    Packs/day: 0.20    Years: 40.00    Pack years: 8.00    Types: Cigarettes  . Smokeless tobacco: Never Used  . Tobacco comment: quit march 2010 after 20 yrs  Vaping Use  . Vaping Use: Never used  Substance and Sexual Activity  . Alcohol use: No  . Drug use: No  . Sexual activity: Not on file  Other Topics Concern  . Not on file  Social History Narrative   Lives with wife Paulette    Caffeine use: Coffee- 2 mugs per day   Left handed   Social Determinants of Health   Financial Resource Strain: Not on file  Food Insecurity: Not on file  Transportation Needs: Not on file  Physical Activity: Not on file  Stress: Not on file  Social Connections: Not on file  Intimate Partner Violence: Not on file    Family History:   The patient's family history includes Colon cancer in his mother; Diabetes in his father; Heart attack in his mother; High blood pressure in his father.    ROS:  Please see the history of present illness.  All other ROS reviewed and negative.     Physical Exam/Data:   Vitals:   07/25/20 1646 07/25/20 1651 07/25/20 1656 07/25/20 1754  BP: (!) 178/111 (!) 176/106 (!) 156/97 (!) 171/98  Pulse: 74 74 78 70  Resp: 15 (!) 9 12 15   Temp:    98.4 F (36.9 C)  TempSrc:    Oral  SpO2: (!) 0% 100% (!) 88% 100%  Weight:      Height:       No intake or output data in the 24 hours ending 07/25/20 1800 Last 3 Weights 07/25/2020 09/27/2016 09/02/2016  Weight (lbs) 215 lb 223 lb 218 lb  3.2 oz  Weight (kg) 97.523 kg 101.152 kg 98.975 kg     Body mass index is 31.75 kg/m.  General:  Well nourished, well developed, in no acute distress HEENT: normal Lymph: no adenopathy Neck: no JVD Endocrine:  No thryomegaly Vascular: No carotid bruits; FA pulses 2+ bilaterally without bruits  Cardiac:  normal S1, S2; RRR; no murmur  Lungs:  clear to auscultation bilaterally, no wheezing, rhonchi or rales  Abd: soft, nontender, no hepatomegaly  Ext: no edema Musculoskeletal:  No deformities, BUE and BLE strength normal and equal Skin: warm and dry  Neuro:  CNs 2-12 intact, no focal abnormalities noted Psych:  Normal affect    EKG:  The ECG that was done today was personally reviewed and demonstrates NSR with ST elevation 1-2 mm in leads 2,3, AVF c/w inferior STE MI  Relevant CV Studies: none  Laboratory Data:  High Sensitivity Troponin:  No results for input(s): TROPONINIHS in the last 720 hours.    Chemistry Recent Labs  Lab 07/25/20 1503  NA 138  K 4.1  CL 108  CO2 21*  GLUCOSE 134*  BUN 10  CREATININE 1.11  CALCIUM 9.0  GFRNONAA >60  ANIONGAP 9    No results for input(s): PROT, ALBUMIN, AST, ALT, ALKPHOS, BILITOT in the last 168 hours. Hematology Recent Labs  Lab 07/25/20 1503  WBC 8.0  RBC 5.22  HGB 14.7  HCT 45.1  MCV 86.4  MCH 28.2  MCHC 32.6  RDW 14.2  PLT 154   BNPNo results for input(s): BNP, PROBNP in the last 168 hours.  DDimer No results for input(s): DDIMER in the last 168 hours.   Radiology/Studies:  CARDIAC CATHETERIZATION  Result Date: 07/25/2020  Prox LAD to Mid LAD lesion is 95% stenosed.  1st Diag lesion is 90% stenosed.  Mid Cx lesion is 100% stenosed.  Prox RCA to Dist RCA lesion is 100% stenosed.  Dist RCA lesion is 100% stenosed with 100% stenosed side branch in RPDA.  LIMA graft was visualized by angiography and is normal in caliber.  The graft exhibits no disease.  SVG graft was visualized by angiography.  RPDA lesion  is 95% stenosed.  Mid Graft to Insertion lesion is 99% stenosed.  Post intervention, there is a 100% residual stenosis.  Balloon angioplasty was performed using a BALLOON SAPPHIRE 3.0X20.  The left ventricular systolic function is normal.  LV end diastolic pressure is normal.  The left ventricular ejection fraction is 55-65%  by visual estimate.  1. 3 vessel occlusive CAD.    - 95% mid LAD. 90% diffuse small first diagonal    - 100% mid LCx    - 100% proximal RCA. 2. Patent LIMA to the LAD 3. Subtotal Occlusion of the SVG to PDA with extensive thrombosis. 4. Good LV function 5. Normal LVEDP 6. Unsuccessful PCI of the SVG to PDA with distal embolization of thrombus and total occlusion of the SVG Plan: Medical therapy. DAPT for ACS. Aggressive risk factor modification. Patient noted to be COVID positive.     Assessment and Plan:   1. Acute inferior STEMI. Patient with remote CABG in 2006. Prior cath showed SVG to PDA and LIMA to LAD. I suspect the SVG to PDA is now occluded. Given ASA and IV heparin. Will proceed with emergent cardiac cath and possible PCI.  2. CAD with remote CABG in 2006 3. DM type 2. Previously on metformin but on no meds recently 4. HTN poorly controlled. Noncompliant with medical therapy 5. Hypercholesterolemia. Will start high dose statin 6. Tobacco abuse. Counsel on smoking cessation. 7. Noncompliance.    Risk Assessment/Risk Scores:     TIMI Risk Score for ST  Elevation MI:   The patient's TIMI risk score is 5, which indicates a 12.4% risk of all cause mortality at 30 days.        Severity of Illness: The appropriate patient status for this patient is INPATIENT. Inpatient status is judged to be reasonable and necessary in order to provide the required intensity of service to ensure the patient's safety. The patient's presenting symptoms, physical exam findings, and initial radiographic and laboratory data in the context of their chronic comorbidities is felt to  place them at high risk for further clinical deterioration. Furthermore, it is not anticipated that the patient will be medically stable for discharge from the hospital within 2 midnights of admission. The following factors support the patient status of inpatient.   " The patient's presenting symptoms include acute chest pain. " The worrisome physical exam findings include none. " The initial radiographic and laboratory data are worrisome because of ECg with ST elevation. " The chronic co-morbidities include HTN, DM, HLD. Prior CABG.   * I certify that at the point of admission it is my clinical judgment that the patient will require inpatient hospital care spanning beyond 2 midnights from the point of admission due to high intensity of service, high risk for further deterioration and high frequency of surveillance required.*    For questions or updates, please contact [facility] Please consult [email or web address] for contact info under     Signed, Burnie Therien [place], MD  07/25/2020 6:00 PM

## 2020-07-25 NOTE — ED Provider Notes (Signed)
[facility] EMERGENCY DEPARTMENT Provider Note   CSN: [id number] Arrival date & time: 07/25/20  1447     History Chief Complaint  Patient presents with  . Chest Pain    Christopher Roberts is a 75 y.o. male.  Patient is a 75 year old male with history of CAD with known stents and previous CABG who presents with chest pain.  Patient reports that chest pain started at 9 AM this morning.  He was sitting on his couch and watching his grandchildren play when the chest pain started he states that it has been intermittent since onset.  At its worst was an 8 out of 10 pain, however on our evaluation has improved to 2 out of 10 pain.  States is diffusely over his chest.  Denies shortness of breath.  Denies nausea, vomiting, and diaphoresis.  Patient reports that he has been out of his medications for a month.  Patient has not taken his hypertension medications, Plavix, or aspirin.  He is not taking any medications to improve his pain.  Pain does get worse when he gets up and walks around.        Past Medical History:  Diagnosis Date  . CAD (coronary artery disease)    a. s/p MI in 2005 (symptom of "indigestion"); b. CABG x 3 in 3/09: L-LAD, S-OM, EF unknown  . Colon polyps   . Diabetes mellitus   . DJD (degenerative joint disease)   . HLD (hyperlipidemia)   . HTN (hypertension)   . MI (myocardial infarction) (HCC) 2005   manifested by indigestion  . Murmur    echo 6/12:  Upper septal thickening, no LVOT gradient,, EF 65%, mild LAE      Patient Active Problem List   Diagnosis Date Noted  . STEMI involving right coronary artery (HCC) 07/25/2020  . ST elevation myocardial infarction involving right coronary artery (HCC) 07/25/2020  . Educated about COVID-19 virus infection 11/04/2019  . Vertigo   . Hypertensive urgency 08/30/2016  . Vomiting 08/30/2016  . Type 2 diabetes mellitus without complication, without long-term current use of insulin (HCC) 08/30/2016  .  Abnormal stress test 10/25/2010  . Shortness of breath 10/18/2010  . Murmur 10/18/2010  . Tobacco abuse 10/18/2010  . COLONIC POLYPS, HX OF 03/21/2010  . Hyperlipidemia 10/13/2008  . Essential hypertension 10/13/2008  . Coronary artery disease due to lipid rich plaque 10/13/2008  . CHEST PAIN-UNSPECIFIED 10/13/2008  . CHEST PAIN-PRECORDIAL 10/13/2008    Past Surgical History:  Procedure Laterality Date  . CHOLECYSTECTOMY  2002  . CORONARY ARTERY BYPASS GRAFT  2009   x3  . TONSILLECTOMY         Family History  Problem Relation Age of Onset  . Colon cancer Mother   . Heart attack Mother   . High blood pressure Father   . Diabetes Father     Social History   Tobacco Use  . Smoking status: Current Every Day Smoker    Packs/day: 0.20    Years: 40.00    Pack years: 8.00    Types: Cigarettes  . Smokeless tobacco: Never Used  . Tobacco comment: quit march 2010 after 20 yrs  Vaping Use  . Vaping Use: Never used  Substance Use Topics  . Alcohol use: No  . Drug use: No    Home Medications Prior to Admission medications   Medication Sig Start Date End Date Taking? Authorizing Provider  amLODipine (NORVASC) 10 MG tablet Take 1 tablet (10 mg total)  by mouth daily. 09/01/16   Joseph Art, DO  aspirin 81 MG tablet Take 81 mg by mouth daily.      [provider]  carvedilol (COREG) 25 MG tablet Take 25 mg by mouth 2 (two) times daily with a meal.      [provider]  clopidogrel (PLAVIX) 75 MG tablet Take 75 mg by mouth daily.      [provider]  isosorbide mononitrate (IMDUR) 30 MG 24 hr tablet Take 1 tablet (30 mg total) by mouth daily. 10/21/12 08/30/16  Bensimhon, Bevelyn Buckles, MD  lisinopril (PRINIVIL,ZESTRIL) 10 MG tablet Take 20 mg by mouth daily.    [provider]  loratadine (CLARITIN) 10 MG tablet Take 1 tablet (10 mg total) by mouth daily. Patient not taking: Reported on 11/16/2016 09/01/16   Joseph Art, DO  meclizine  (ANTIVERT) 12.5 MG tablet Take 1 tablet (12.5 mg total) by mouth 3 (three) times daily as needed for dizziness. Patient not taking: Reported on 11/16/2016 09/02/16   Maxie Barb, MD  metFORMIN (GLUCOPHAGE) 500 MG tablet Take 500-1,000 mg by mouth 2 (two) times daily. Take one tablet in the morning and two tablets in the evening by mouth.    [provider]  nitroGLYCERIN (NITROSTAT) 0.4 MG SL tablet Place 1 tablet (0.4 mg total) under the tongue every 5 (five) minutes as needed for chest pain. 10/25/10 08/30/16  Tereso Newcomer T, PA-C  predniSONE (DELTASONE) 10 MG tablet Take 1 tablet (10 mg total) by mouth daily with breakfast. Start on 09/08/2016 take 40 mg, then next day 30 mg, next day 20 mg and next day 10 mg and then stop. Patient not taking: Reported on 11/16/2016 09/08/16   Maxie Barb, MD  simvastatin (ZOCOR) 80 MG tablet Take 40 mg by mouth at bedtime.     [provider]    Allergies    Patient has no known allergies.  Review of Systems   Review of Systems  Constitutional: Negative for chills and fever.  HENT: Negative for ear pain and sore throat.   Eyes: Negative for pain and visual disturbance.  Respiratory: Negative for cough and shortness of breath.   Cardiovascular: Positive for chest pain. Negative for palpitations.  Gastrointestinal: Negative for abdominal pain, nausea and vomiting.  Genitourinary: Negative for dysuria and hematuria.  Musculoskeletal: Negative for arthralgias and back pain.  Skin: Negative for color change and rash.  Neurological: Negative for seizures and syncope.  All other systems reviewed and are negative.   Physical Exam Updated Vital Signs BP 102/63   Pulse 70   Temp 98.4 F (36.9 C) (Oral)   Resp (!) 26   Ht  (1.753 m)   Wt 97.5 kg   SpO2 97%   BMI 31.75 kg/m   Physical Exam Vitals and nursing note reviewed.  Constitutional:      General: He is not in acute distress.    Appearance: He is  well-developed.  HENT:     Head: Normocephalic and atraumatic.  Eyes:     Conjunctiva/sclera: Conjunctivae normal.  Cardiovascular:     Rate and Rhythm: Normal rate and regular rhythm.     Pulses:          Radial pulses are 2+ on the right side and 2+ on the left side.       Dorsalis pedis pulses are 2+ on the right side and 2+ on the left side.     Heart sounds:  Normal heart sounds.  Pulmonary:     Effort: Pulmonary effort is normal. No respiratory distress.     Breath sounds: Normal breath sounds. No wheezing or rhonchi.  Chest:     Chest wall: No tenderness.  Abdominal:     Palpations: Abdomen is soft.     Tenderness: There is no abdominal tenderness.  Musculoskeletal:     Cervical back: Neck supple.     Right lower leg: No tenderness. No edema.     Left lower leg: No tenderness. No edema.  Skin:    General: Skin is warm and dry.  Neurological:     General: No focal deficit present.     Mental Status: He is alert and oriented to person, place, and time.     ED Results / Procedures / Treatments   Labs (all labs ordered are listed, but only abnormal results are displayed) Labs Reviewed  RESP PANEL BY RT-PCR (FLU A&B, COVID) ARPGX2 - Abnormal; Notable for the following components:      Result Value   SARS Coronavirus 2 by RT PCR POSITIVE (*)    All other components within normal limits  BASIC METABOLIC PANEL - Abnormal; Notable for the following components:   CO2 21 (*)    Glucose, Bld 134 (*)    All other components within normal limits  HEMOGLOBIN A1C - Abnormal; Notable for the following components:   Hgb A1c MFr Bld 8.0 (*)    All other components within normal limits  APTT - Abnormal; Notable for the following components:   aPTT 20 (*)    All other components within normal limits  LIPID PANEL - Abnormal; Notable for the following components:   Cholesterol 233 (*)    LDL Cholesterol 162 (*)    All other components within normal limits  GLUCOSE, CAPILLARY -  Abnormal; Notable for the following components:   Glucose-Capillary 161 (*)    All other components within normal limits  TROPONIN I (HIGH SENSITIVITY) - Abnormal; Notable for the following components:   Troponin I (High Sensitivity) 771 (*)    All other components within normal limits  TROPONIN I (HIGH SENSITIVITY) - Abnormal; Notable for the following components:   Troponin I (High Sensitivity) 1,360 (*)    All other components within normal limits  MRSA PCR SCREENING  CBC  PROTIME-INR  BASIC METABOLIC PANEL  CBC  POCT ACTIVATED CLOTTING TIME  POCT ACTIVATED CLOTTING TIME  POCT ACTIVATED CLOTTING TIME  POCT ACTIVATED CLOTTING TIME    EKG EKG Interpretation  Date/Time:  Sunday July 25 2020 14:56:48 EDT Ventricular Rate:  82 PR Interval:  138 QRS Duration: 84 QT Interval:  384 QTC Calculation: 448 R Axis:   51 Text Interpretation: Normal sinus rhythm ST elevation consider inferior injury or acute infarct STEMI Consider right ventricular involvement in acute inferior infarct Abnormal ECG Confirmed by Gwyneth Sprout ([id number]) on 07/25/2020 3:03:49 PM   Radiology CARDIAC CATHETERIZATION  Result Date: 07/25/2020  Prox LAD to Mid LAD lesion is 95% stenosed.  1st Diag lesion is 90% stenosed.  Mid Cx lesion is 100% stenosed.  Prox RCA to Dist RCA lesion is 100% stenosed.  Dist RCA lesion is 100% stenosed with 100% stenosed side branch in RPDA.  LIMA graft was visualized by angiography and is normal in caliber.  The graft exhibits no disease.  SVG graft was visualized by angiography.  RPDA lesion is 95% stenosed.  Mid Graft to Insertion lesion is 99% stenosed.  Post intervention, there  is a 100% residual stenosis.  Balloon angioplasty was performed using a BALLOON SAPPHIRE 3.0X20.  The left ventricular systolic function is normal.  LV end diastolic pressure is normal.  The left ventricular ejection fraction is 55-65% by visual estimate.  1. 3 vessel occlusive CAD.    - 95%  mid LAD. 90% diffuse small first diagonal    - 100% mid LCx    - 100% proximal RCA. 2. Patent LIMA to the LAD 3. Subtotal Occlusion of the SVG to PDA with extensive thrombosis. 4. Good LV function 5. Normal LVEDP 6. Unsuccessful PCI of the SVG to PDA with distal embolization of thrombus and total occlusion of the SVG Plan: Medical therapy. DAPT for ACS. Aggressive risk factor modification. Patient noted to be COVID positive.    Procedures Procedures  Medications Ordered in ED Medications  0.9 %  sodium chloride infusion ( Intravenous New Bag/Given 07/25/20 1510)  labetalol (NORMODYNE) injection 10 mg (has no administration in time range)  hydrALAZINE (APRESOLINE) injection 10 mg (10 mg Intravenous Given 07/25/20 1751)  acetaminophen (TYLENOL) tablet 650 mg (has no administration in time range)  ondansetron (ZOFRAN) injection 4 mg (has no administration in time range)  nitroGLYCERIN (NITROSTAT) SL tablet 0.4 mg (0.4 mg Sublingual Given 07/25/20 1822)  insulin aspart (novoLOG) injection 0-15 Units (has no administration in time range)  heparin injection 5,000 Units (has no administration in time range)  0.9% sodium chloride infusion (1 mL/kg/hr  97.5 kg Intravenous IV Pump Association 07/25/20 1757)  sodium chloride flush (NS) 0.9 % injection 3 mL (has no administration in time range)  sodium chloride flush (NS) 0.9 % injection 3 mL (has no administration in time range)  0.9 %  sodium chloride infusion (has no administration in time range)  atorvastatin (LIPITOR) tablet 80 mg (80 mg Oral Given 07/25/20 1808)  aspirin chewable tablet 81 mg (has no administration in time range)  clopidogrel (PLAVIX) tablet 75 mg (has no administration in time range)  carvedilol (COREG) tablet 12.5 mg (has no administration in time range)  amLODipine (NORVASC) tablet 5 mg (5 mg Oral Given 07/25/20 1809)  nitroGLYCERIN 50 mg in dextrose 5 % 250 mL (0.2 mg/mL) infusion (20 mcg/min Intravenous Infusion Verify 07/25/20  2300)  aspirin chewable tablet 324 mg (324 mg Oral Given 07/25/20 1511)  heparin injection 60 Units/kg ( Intravenous Duplicate 07/25/20 1810)  hydrALAZINE (APRESOLINE) 20 MG/ML injection (  Duplicate 07/25/20 1754)  metoprolol tartrate (LOPRESSOR) injection 5 mg (5 mg Intravenous Given 07/25/20 1841)  nitroGLYCERIN 0.2 mg/mL in dextrose 5 % infusion (  Duplicate 07/25/20 1843)    ED Course  I have reviewed the triage vital signs and the nursing notes.  Pertinent labs & imaging results that were available during my care of the patient were reviewed by me and considered in my medical decision making (see chart for details).    MDM Rules/Calculators/A&P                          75 year old man with known CAD who presents with chest pain.  Triage EKG shows ST elevations in leads II, III and aVF confirming inferior STEMI.  Cath Lab activated.  On comparison patient's EKG here to his old EKGs, ST elevations in the inferior leads are new.  Patient with appropriate blood pressure.  Appropriate pulse.  Saturating 90% on room air.  Hemodynamically stable.  Patient received aspirin and heparin soon after arrival.  Patient remained hemodynamically stable  during the stay in the emergency department.  Patient was urgently taken to Cath Lab in stable condition.  Admit to cardiology after.  Final Clinical Impression(s) / ED Diagnoses Final diagnoses:  ST elevation myocardial infarction involving right coronary artery [facility](HCC)    Rx / DC Orders ED Discharge Orders    None       Kugler, [place], MD 07/26/20 [id number]    Gwyneth SproutPlunkett, Whitney, MD 07/28/20 0800

## 2020-07-26 ENCOUNTER — Inpatient Hospital Stay (HOSPITAL_COMMUNITY): Payer: Medicare HMO

## 2020-07-26 ENCOUNTER — Encounter (HOSPITAL_COMMUNITY): Payer: Self-pay | Admitting: Cardiology

## 2020-07-26 DIAGNOSIS — I2111 ST elevation (STEMI) myocardial infarction involving right coronary artery: Secondary | ICD-10-CM | POA: Diagnosis not present

## 2020-07-26 DIAGNOSIS — I2510 Atherosclerotic heart disease of native coronary artery without angina pectoris: Secondary | ICD-10-CM | POA: Diagnosis not present

## 2020-07-26 DIAGNOSIS — Z716 Tobacco abuse counseling: Secondary | ICD-10-CM

## 2020-07-26 LAB — ECHOCARDIOGRAM COMPLETE
AR max vel: 4.27 cm2
AV Area VTI: 5.01 cm2
AV Area mean vel: 4.06 cm2
AV Mean grad: 4 mmHg
AV Peak grad: 8.6 mmHg
Ao pk vel: 1.47 m/s
Area-P 1/2: 3.5 cm2
Calc EF: 57.8 %
Height: 69 in
S' Lateral: 1.7 cm
Single Plane A2C EF: 66.7 %
Single Plane A4C EF: 47.4 %
Weight: 3440 oz

## 2020-07-26 LAB — BASIC METABOLIC PANEL
Anion gap: 6 (ref 5–15)
BUN: 11 mg/dL (ref 8–23)
CO2: 18 mmol/L — ABNORMAL LOW (ref 22–32)
Calcium: 8.3 mg/dL — ABNORMAL LOW (ref 8.9–10.3)
Chloride: 112 mmol/L — ABNORMAL HIGH (ref 98–111)
Creatinine, Ser: 0.95 mg/dL (ref 0.61–1.24)
GFR, Estimated: >60 mL/min (ref >60)
Glucose, Bld: 131 mg/dL — ABNORMAL HIGH (ref 70–99)
Potassium: 3.7 mmol/L (ref 3.5–5.1)
Sodium: 136 mmol/L (ref 135–145)

## 2020-07-26 LAB — CBC
HCT: 35.2 % — ABNORMAL LOW (ref 39.0–52.0)
Hemoglobin: 12 g/dL — ABNORMAL LOW (ref 13.0–17.0)
MCH: 28.8 pg (ref 26.0–34.0)
MCHC: 34.1 g/dL (ref 30.0–36.0)
MCV: 84.4 fL (ref 80.0–100.0)
Platelets: 129 10*3/uL — ABNORMAL LOW (ref 150–400)
RBC: 4.17 MIL/uL — ABNORMAL LOW (ref 4.22–5.81)
RDW: 14.6 % (ref 11.5–15.5)
WBC: 7.5 10*3/uL (ref 4.0–10.5)
nRBC: 0 % (ref 0.0–0.2)

## 2020-07-26 LAB — GLUCOSE, CAPILLARY
Glucose-Capillary: 112 mg/dL — ABNORMAL HIGH (ref 70–99)
Glucose-Capillary: 168 mg/dL — ABNORMAL HIGH (ref 70–99)
Glucose-Capillary: 113 mg/dL — ABNORMAL HIGH (ref 70–99)
Glucose-Capillary: 138 mg/dL — ABNORMAL HIGH (ref 70–99)

## 2020-07-26 MED ORDER — CHLORHEXIDINE GLUCONATE CLOTH 2 % EX PADS
6.0000 | MEDICATED_PAD | Freq: Every day | CUTANEOUS | Status: DC
  Administered 2020-07-26 – 2020-07-27 (×2): 6 via TOPICAL

## 2020-07-26 MED FILL — Fentanyl Citrate Preservative Free (PF) Inj 100 MCG/2ML: INTRAMUSCULAR | Qty: 2 | Status: AC

## 2020-07-26 MED FILL — Midazolam HCl Inj 2 MG/2ML (Base Equivalent): INTRAMUSCULAR | Qty: 2 | Status: AC

## 2020-07-26 MED FILL — Heparin Sod (Porcine)-NaCl IV Soln 1000 Unit/500ML-0.9%: INTRAVENOUS | Qty: 500 | Status: AC

## 2020-07-26 NOTE — Progress Notes (Addendum)
CARDIAC REHAB PHASE I   Spoke with pt via room phone. Pt educated on importance of ASA and Plavix. Given MI book along with heart healthy and diabetic diets. Reviewed site care, restrictions, and exercise guidelines. Smoking cessation tip sheet at bedside. Pt denies further questions or concerns at this time. Will refer to CRP II [place]. Will continue to follow.  [id number] Reynold Bowen, RN BSN 07/26/2020 1:11 PM

## 2020-07-26 NOTE — Progress Notes (Signed)
Progress Note  Patient Name: Christopher Roberts Date of Encounter: 07/26/2020  [facility] Cardiologist: Peter [place], MD   Subjective   Sitting up in chair today, on room air. Denies chest pain or shortness of breath. We reviewed his cath results at length today. He does state that he was taking about 15 medications prior to admission. We discussed diet, exercise, and medication in terms of long term management. He has not been up walking yet.  Inpatient Medications    Scheduled Meds: . amLODipine  5 mg Oral Daily  . aspirin  81 mg Oral Daily  . atorvastatin  80 mg Oral q1800  . carvedilol  12.5 mg Oral BID WC  . Chlorhexidine Gluconate Cloth  6 each Topical Daily  . clopidogrel  75 mg Oral Q breakfast  . heparin  5,000 Units Subcutaneous Q8H  . insulin aspart  0-15 Units Subcutaneous TID WC  . sodium chloride flush  3 mL Intravenous Q12H   Continuous Infusions: . sodium chloride 20 mL/hr at 07/25/20 1510  . sodium chloride    . nitroGLYCERIN 10 mcg/min (07/26/20 0400)   PRN Meds: sodium chloride, acetaminophen, nitroGLYCERIN, ondansetron (ZOFRAN) IV, sodium chloride flush   Vital Signs    Vitals:   07/26/20 0500 07/26/20 0600 07/26/20 0700 07/26/20 0812  BP: 111/70 104/77 105/73 128/80  Pulse:    77  Resp: 17 15 18    Temp:      TempSrc:      SpO2: 100% 100% 99%   Weight:      Height:        Intake/Output Summary (Last 24 hours) at 07/26/2020 0957 Last data filed at 07/26/2020 0400 Gross per 24 hour  Intake 48.04 ml  Output 450 ml  Net -401.96 ml   Last 3 Weights 07/25/2020 09/27/2016 09/02/2016  Weight (lbs) 215 lb 223 lb 218 lb 3.2 oz  Weight (kg) 97.523 kg 101.152 kg 98.975 kg      Telemetry     sinus with PVCs and PVC triplet- Personally Reviewed  ECG    07/25/20 18:30 sinus tachycardia with ST elevation in II, III, aVF, V6 with reciprocal depressions - Personally Reviewed  Physical Exam   GEN: No acute distress.   Neck: No JVD Cardiac: RRR, no  murmurs, rubs, or gallops. Groin cath side c/d/i Respiratory: Clear to auscultation bilaterally. GI: Soft, nontender, non-distended  MS: No edema; No deformity. Neuro:  Nonfocal  Psych: Normal affect   Labs    High Sensitivity Troponin:   Recent Labs  Lab 07/25/20 1812 07/25/20 1952  TROPONINIHS 771* 1,360*      Chemistry Recent Labs  Lab 07/25/20 1503 07/26/20 0203  NA 138 136  K 4.1 3.7  CL 108 112*  CO2 21* 18*  GLUCOSE 134* 131*  BUN 10 11  CREATININE 1.11 0.95  CALCIUM 9.0 8.3*  GFRNONAA >60 >60  ANIONGAP 9 6     Hematology Recent Labs  Lab 07/25/20 1503 07/26/20 0203  WBC 8.0 7.5  RBC 5.22 4.17*  HGB 14.7 12.0*  HCT 45.1 35.2*  MCV 86.4 84.4  MCH 28.2 28.8  MCHC 32.6 34.1  RDW 14.2 14.6  PLT 154 129*    BNPNo results for input(s): BNP, PROBNP in the last 168 hours.   DDimer No results for input(s): DDIMER in the last 168 hours.   Radiology    CARDIAC CATHETERIZATION  Result Date: 07/25/2020  Prox LAD to Mid LAD lesion is 95% stenosed.  1st Diag  lesion is 90% stenosed.  Mid Cx lesion is 100% stenosed.  Prox RCA to Dist RCA lesion is 100% stenosed.  Dist RCA lesion is 100% stenosed with 100% stenosed side branch in RPDA.  LIMA graft was visualized by angiography and is normal in caliber.  The graft exhibits no disease.  SVG graft was visualized by angiography.  RPDA lesion is 95% stenosed.  Mid Graft to Insertion lesion is 99% stenosed.  Post intervention, there is a 100% residual stenosis.  Balloon angioplasty was performed using a BALLOON SAPPHIRE 3.0X20.  The left ventricular systolic function is normal.  LV end diastolic pressure is normal.  The left ventricular ejection fraction is 55-65% by visual estimate.  1. 3 vessel occlusive CAD.    - 95% mid LAD. 90% diffuse small first diagonal    - 100% mid LCx    - 100% proximal RCA. 2. Patent LIMA to the LAD 3. Subtotal Occlusion of the SVG to PDA with extensive thrombosis. 4. Good LV  function 5. Normal LVEDP 6. Unsuccessful PCI of the SVG to PDA with distal embolization of thrombus and total occlusion of the SVG Plan: Medical therapy. DAPT for ACS. Aggressive risk factor modification. Patient noted to be COVID positive.    Cardiac Studies   Cath 07/25/20  Prox LAD to Mid LAD lesion is 95% stenosed.  1st Diag lesion is 90% stenosed.  Mid Cx lesion is 100% stenosed.  Prox RCA to Dist RCA lesion is 100% stenosed.  Dist RCA lesion is 100% stenosed with 100% stenosed side branch in RPDA.  LIMA graft was visualized by angiography and is normal in caliber.  The graft exhibits no disease.  SVG graft was visualized by angiography.  RPDA lesion is 95% stenosed.  Mid Graft to Insertion lesion is 99% stenosed.  Post intervention, there is a 100% residual stenosis.  Balloon angioplasty was performed using a BALLOON SAPPHIRE 3.0X20.  The left ventricular systolic function is normal.  LV end diastolic pressure is normal.  The left ventricular ejection fraction is 55-65% by visual estimate.   1. 3 vessel occlusive CAD.     - 95% mid LAD. 90% diffuse small first diagonal    - 100% mid LCx    - 100% proximal RCA. 2. Patent LIMA to the LAD 3. Subtotal Occlusion of the SVG to PDA with extensive thrombosis. 4. Good LV function 5. Normal LVEDP 6. Unsuccessful PCI of the SVG to PDA with distal embolization of thrombus and total occlusion of the SVG  Plan: Medical therapy. DAPT for ACS. Aggressive risk factor modification. Patient noted to be COVID positive.   Echo pending  Patient Profile     75 y.o. male with PMH CAD s/p CABG 2006 presenting with inferior STEMI due to thrombosed SVG-PDA. PCI unsuccessful, with complete occlusion of SVG-PDA  Assessment & Plan    Acute inferior STEMI CAD s/p CABG 2006 -native three vessel occlusive CAD -LIMA-LAD patent -SVG-PDA with thrombosis, unable to to PCI, distal embolization of thrombus and total occlusion of  SVG -started aspirin, clopidogrel -started atorvastatin 80 mg -echo pending -phase 1 cardiac rehab pending -hsTnI elevated, given complete occlusion would expect this to continue to rise  Type II diabetes: -A1c 8.0 -given CAD, recommend SGLT2i or GLP1RA. Cr 0.95.  -may be a good candidate for EMPACT-MI if he is willing to participate, awaiting echo as well  Hypertension -BP very labile, range 91/65-191/108 -restarted amlodipine 5 mg daily, carvedilol 12.5 mg BID -off nitro drip, if BP  rises may be able to add imdur  Hypercholesterolemia: -lipids this admission show Tchol 233, TG 1010, HDL 51, LDL 162. LDL previously 61 3 years ago -LDL goal <70 -started atorvastatin 80 mg, recheck lipids as outpatient  Tobacco abuse: The patient was counseled on tobacco cessation today for 5 minutes.  Counseling included reviewing the risks of smoking tobacco products, how it impacts the patient's current medical diagnoses and different strategies for quitting.  Pharmacotherapy to aid in tobacco cessation was not prescribed today. He is motivated to quit  Covid-19 positive -asymptomatic, on room air  If he ambulates without issues, likely can go to the floor later today  For questions or updates, please contact [facility] Please consult [email or web address] for contact info under        Signed, Jodelle Red, MD  07/26/2020, 9:57 AM

## 2020-07-26 NOTE — Progress Notes (Addendum)
Procedure: Arterial SHEATH REMOVAL   Pre-Procedure Vital Signs:  111/73 (85), 76HR, 100% Spo2  Time Removed: 2240 (07/25/2020) Minutes Held Pressure: Site: Right Femoral Quality of Sheath: Intact Difficulties: None Post Pain Assessment: 0/10 Time Hemostasis Occurred: 2300 (07/25/2020) Method to obtain Hemostasis:  Manual Pressure  Post-Procedure Vital Signs: 103/62 (72), 78HR, 100%Spo2   Peripheral vascular: +2 Right femoral, +2 Right [place] Pedis Neurovascular checks: No numbness, tingling, or decreased sensation.  Level 0  Dressing: 4x4 Gauze, Tegaderm   Unexpected outcomes/Interventions:  None  Royston Bake, BSN, RN, [employer]

## 2020-07-26 NOTE — Progress Notes (Signed)
CARDIAC REHAB PHASE I   Left education materials with RN. Attempted to call pt on room phone to complete education. No answer at this time. Will call back to complete education as able.  Reynold Bowen, RN BSN 07/26/2020 11:04 AM

## 2020-07-27 DIAGNOSIS — E108 Type 1 diabetes mellitus with unspecified complications: Secondary | ICD-10-CM

## 2020-07-27 DIAGNOSIS — F32A Depression, unspecified: Secondary | ICD-10-CM

## 2020-07-27 LAB — GLUCOSE, CAPILLARY
Glucose-Capillary: 101 mg/dL — ABNORMAL HIGH (ref 70–99)
Glucose-Capillary: 180 mg/dL — ABNORMAL HIGH (ref 70–99)

## 2020-07-27 MED ORDER — AMLODIPINE BESYLATE 10 MG PO TABS: 5.0000 mg | ORAL_TABLET | Freq: Every day | ORAL | 2 refills | Status: DC

## 2020-07-27 MED ORDER — ATORVASTATIN CALCIUM 80 MG PO TABS: 80.0000 mg | ORAL_TABLET | Freq: Every day | ORAL | 3 refills | Status: DC

## 2020-07-27 MED ORDER — CARVEDILOL 12.5 MG PO TABS: 12.5000 mg | ORAL_TABLET | Freq: Two times a day (BID) | ORAL | 3 refills | Status: DC

## 2020-07-27 NOTE — Progress Notes (Signed)
CARDIAC REHAB PHASE I   Called pt on room phone to f/u yesterdays education. Pt denies questions or concerns at this time, has not had a chance to read over materials, but states his wife is bringing his glasses today. Pt requesting f/u call tomorrow. Will f/u.  Reynold Bowen, RN BSN 07/27/2020 11:09 AM

## 2020-07-27 NOTE — Progress Notes (Signed)
Progress Note  Patient Name: Christopher Roberts Date of Encounter: 07/27/2020  [facility] Cardiologist: Peter [place], MD   Subjective   No acute events overnight. Feels a little lightheaded when changing positions. He tells me that he was taking his medications prior to admission, but he asked me to call his wife to clarify which medications. I called and spoke to his wife Adiel Erney at length this AM (phone call 25 min). She reports they all had Covid about a month ago, and they have been under a lot of stress. Wife reports that he has been very depressed for about a month, hasn't gotten off the couch, hasn't taken his meds at all during that time. I recommended she contact Dr. Malena Peer office and set up a close appt to talk about depression and options for management; she will do this today.  Inpatient Medications    Scheduled Meds: . aspirin  81 mg Oral Daily  . atorvastatin  80 mg Oral q1800  . carvedilol  12.5 mg Oral BID WC  . Chlorhexidine Gluconate Cloth  6 each Topical Daily  . clopidogrel  75 mg Oral Q breakfast  . heparin  5,000 Units Subcutaneous Q8H  . insulin aspart  0-15 Units Subcutaneous TID WC  . sodium chloride flush  3 mL Intravenous Q12H   Continuous Infusions: . sodium chloride     PRN Meds: sodium chloride, acetaminophen, nitroGLYCERIN, ondansetron (ZOFRAN) IV, sodium chloride flush   Vital Signs    Vitals:   07/27/20 0700 07/27/20 0746 07/27/20 0800 07/27/20 0900  BP: 116/79 116/79 118/63 94/65  Pulse:  70    Resp: 12  (!) 21 17  Temp:      TempSrc:      SpO2: 97%  98% 97%  Weight:      Height:        Intake/Output Summary (Last 24 hours) at 07/27/2020 1030 Last data filed at 07/27/2020 0600 Gross per 24 hour  Intake -  Output 800 ml  Net -800 ml   Last 3 Weights 07/25/2020 09/27/2016 09/02/2016  Weight (lbs) 215 lb 223 lb 218 lb 3.2 oz  Weight (kg) 97.523 kg 101.152 kg 98.975 kg      Telemetry     sinus with rare PVCs- Personally  Reviewed  ECG    07/27/20 sinus rhythm with inferolateral T wave inversions - Personally Reviewed  Physical Exam   GEN: Well nourished, well developed in no acute distress NECK: No JVD CARDIAC: regular rhythm, normal S1 and S2, no rubs or gallops. No murmur. VASCULAR: Radial pulses 2+ bilaterally.  RESPIRATORY:  Clear to auscultation without rales, wheezing or rhonchi  ABDOMEN: Soft, non-tender, non-distended MUSCULOSKELETAL:  Moves all 4 limbs independently SKIN: Warm and dry, no edema NEUROLOGIC:  No focal neuro deficits noted. PSYCHIATRIC:  Normal affect   Labs    High Sensitivity Troponin:   Recent Labs  Lab 07/25/20 1812 07/25/20 1952  TROPONINIHS 771* 1,360*      Chemistry Recent Labs  Lab 07/25/20 1503 07/26/20 0203  NA 138 136  K 4.1 3.7  CL 108 112*  CO2 21* 18*  GLUCOSE 134* 131*  BUN 10 11  CREATININE 1.11 0.95  CALCIUM 9.0 8.3*  GFRNONAA >60 >60  ANIONGAP 9 6     Hematology Recent Labs  Lab 07/25/20 1503 07/26/20 0203  WBC 8.0 7.5  RBC 5.22 4.17*  HGB 14.7 12.0*  HCT 45.1 35.2*  MCV 86.4 84.4  MCH 28.2 28.8  MCHC 32.6 34.1  RDW 14.2 14.6  PLT 154 129*    BNPNo results for input(s): BNP, PROBNP in the last 168 hours.   DDimer No results for input(s): DDIMER in the last 168 hours.   Radiology    CARDIAC CATHETERIZATION  Result Date: 07/25/2020  Prox LAD to Mid LAD lesion is 95% stenosed.  1st Diag lesion is 90% stenosed.  Mid Cx lesion is 100% stenosed.  Prox RCA to Dist RCA lesion is 100% stenosed.  Dist RCA lesion is 100% stenosed with 100% stenosed side branch in RPDA.  LIMA graft was visualized by angiography and is normal in caliber.  The graft exhibits no disease.  SVG graft was visualized by angiography.  RPDA lesion is 95% stenosed.  Mid Graft to Insertion lesion is 99% stenosed.  Post intervention, there is a 100% residual stenosis.  Balloon angioplasty was performed using a BALLOON SAPPHIRE 3.0X20.  The left  ventricular systolic function is normal.  LV end diastolic pressure is normal.  The left ventricular ejection fraction is 55-65% by visual estimate.  1. 3 vessel occlusive CAD.    - 95% mid LAD. 90% diffuse small first diagonal    - 100% mid LCx    - 100% proximal RCA. 2. Patent LIMA to the LAD 3. Subtotal Occlusion of the SVG to PDA with extensive thrombosis. 4. Good LV function 5. Normal LVEDP 6. Unsuccessful PCI of the SVG to PDA with distal embolization of thrombus and total occlusion of the SVG Plan: Medical therapy. DAPT for ACS. Aggressive risk factor modification. Patient noted to be COVID positive.   ECHOCARDIOGRAM COMPLETE  Result Date: 07/26/2020    ECHOCARDIOGRAM REPORT   Patient Name:   Christopher Roberts Date of Exam: 07/26/2020 Medical Rec #:  [id number]      Height:       69.0 in Accession #:    [id number]     Weight:       215.0 lb Date of Birth:  [birth year]      BSA:          2.130 m Patient Age:    74 years       BP:           99/62 mmHg Patient Gender: M              HR:           76 bpm. Exam Location:  Inpatient Procedure: 2D Echo, Color Doppler and Cardiac Doppler Indications:    CAD/ STEMI  History:        Patient has prior history of Echocardiogram examinations, most                 recent 09/02/2016. Acute MI, Previous Myocardial Infarction and                 CAD, Signs/Symptoms:Murmur, Chest Pain and Shortness of Breath;                 Risk Factors:Dyslipidemia, Hypertension and Diabetes.  Sonographer:    Neomia Dear RDCS Referring Phys: [id number] PETER M [place] IMPRESSIONS  1. LV cavity is small with near cavity oblteration during systole. No signficant gradients at rest. . Left ventricular ejection fraction, by estimation, is 65 to 70%. The left ventricle has normal function. The left ventricle has no regional wall motion  abnormalities. There is severe left ventricular hypertrophy. Left ventricular diastolic parameters are consistent with Grade I diastolic dysfunction (impaired  relaxation).  2. Right ventricular systolic function is normal. The right ventricular size is normal.  3. The mitral valve is abnormal. Trivial mitral valve regurgitation.  4. The aortic valve is abnormal. Aortic valve regurgitation is mild. Mild aortic valve sclerosis is present, with no evidence of aortic valve stenosis. FINDINGS  Left Ventricle: LV cavity is small with near cavity oblteration during systole. No signficant gradients at rest. Left ventricular ejection fraction, by estimation, is 65 to 70%. The left ventricle has normal function. The left ventricle has no regional wall motion abnormalities. The left ventricular internal cavity size was small. There is severe left ventricular hypertrophy. Left ventricular diastolic parameters are consistent with Grade I diastolic dysfunction (impaired relaxation). Right Ventricle: The right ventricular size is normal. No increase in right ventricular wall thickness. Right ventricular systolic function is normal. Left Atrium: Left atrial size was normal in size. Right Atrium: Right atrial size was normal in size. Pericardium: There is no evidence of pericardial effusion. Mitral Valve: The mitral valve is abnormal. There is mild thickening of the mitral valve leaflet(s). Mild mitral annular calcification. Trivial mitral valve regurgitation. Tricuspid Valve: The tricuspid valve is normal in structure. Tricuspid valve regurgitation is trivial. Aortic Valve: The aortic valve is abnormal. Aortic valve regurgitation is mild. Mild aortic valve sclerosis is present, with no evidence of aortic valve stenosis. Aortic valve mean gradient measures 4.0 mmHg. Aortic valve peak gradient measures 8.6 mmHg.  Aortic valve area, by VTI measures 5.01 cm. Pulmonic Valve: The pulmonic valve was normal in structure. Pulmonic valve regurgitation is not visualized. Aorta: The aortic root and ascending aorta are structurally normal, with no evidence of dilitation. IAS/Shunts: No atrial level  shunt detected by color flow Doppler.  LEFT VENTRICLE PLAX 2D LVIDd:         2.90 cm     Diastology LVIDs:         1.70 cm     LV e' medial:    3.05 cm/s LV PW:         1.60 cm     LV E/e' medial:  15.9 LV IVS:        1.80 cm     LV e' lateral:   4.90 cm/s LVOT diam:     2.30 cm     LV E/e' lateral: 9.9 LV SV:         121 LV SV Index:   57 LVOT Area:     4.15 cm  LV Volumes (MOD) LV vol d, MOD A2C: 25.8 ml LV vol d, MOD A4C: 35.9 ml LV vol s, MOD A2C: 8.6 ml LV vol s, MOD A4C: 18.9 ml LV SV MOD A2C:     17.2 ml LV SV MOD A4C:     35.9 ml LV SV MOD BP:      17.8 ml RIGHT VENTRICLE RV S prime:     12.40 cm/s TAPSE (M-mode): 1.4 cm LEFT ATRIUM             Index       RIGHT ATRIUM           Index LA diam:        3.10 cm 1.46 cm/m  RA Area:     12.10 cm LA Vol (A2C):   25.5 ml 11.97 ml/m RA Volume:   25.30 ml  11.88 ml/m LA Vol (A4C):   64.9 ml 30.46 ml/m LA Biplane Vol: 44.1 ml 20.70 ml/m  AORTIC VALVE  PULMONIC VALVE AV Area (Vmax):    4.27 cm    PV Vmax:       0.76 m/s AV Area (Vmean):   4.06 cm    PV Vmean:      59.500 cm/s AV Area (VTI):     5.01 cm    PV VTI:        0.188 m AV Vmax:           147.00 cm/s PV Peak grad:  2.3 mmHg AV Vmean:          97.100 cm/s PV Mean grad:  2.0 mmHg AV VTI:            0.242 m AV Peak Grad:      8.6 mmHg AV Mean Grad:      4.0 mmHg LVOT Vmax:         151.00 cm/s LVOT Vmean:        94.900 cm/s LVOT VTI:          0.292 m LVOT/AV VTI ratio: 1.21  AORTA Ao Root diam: 3.50 cm Ao Asc diam:  3.20 cm MITRAL VALVE MV Area (PHT): 3.50 cm    SHUNTS MV Decel Time: 217 msec    Systemic VTI:  0.29 m MV E velocity: 48.45 cm/s  Systemic Diam: 2.30 cm MV A velocity: 70.70 cm/s MV E/A ratio:  0.69 Dietrich Pates MD Electronically signed by Dietrich Pates MD Signature Date/Time: 07/26/2020/12:55:28 PM    Final     Cardiac Studies   Cath 07/25/20  Prox LAD to Mid LAD lesion is 95% stenosed.  1st Diag lesion is 90% stenosed.  Mid Cx lesion is 100% stenosed.  Prox RCA to  Dist RCA lesion is 100% stenosed.  Dist RCA lesion is 100% stenosed with 100% stenosed side branch in RPDA.  LIMA graft was visualized by angiography and is normal in caliber.  The graft exhibits no disease.  SVG graft was visualized by angiography.  RPDA lesion is 95% stenosed.  Mid Graft to Insertion lesion is 99% stenosed.  Post intervention, there is a 100% residual stenosis.  Balloon angioplasty was performed using a BALLOON SAPPHIRE 3.0X20.  The left ventricular systolic function is normal.  LV end diastolic pressure is normal.  The left ventricular ejection fraction is 55-65% by visual estimate.   1. 3 vessel occlusive CAD.     - 95% mid LAD. 90% diffuse small first diagonal    - 100% mid LCx    - 100% proximal RCA. 2. Patent LIMA to the LAD 3. Subtotal Occlusion of the SVG to PDA with extensive thrombosis. 4. Good LV function 5. Normal LVEDP 6. Unsuccessful PCI of the SVG to PDA with distal embolization of thrombus and total occlusion of the SVG  Plan: Medical therapy. DAPT for ACS. Aggressive risk factor modification. Patient noted to be COVID positive.   Echo 07/26/20 1. LV cavity is small with near cavity oblteration during systole. No  signficant gradients at rest. . Left ventricular ejection fraction, by  estimation, is 65 to 70%. The left ventricle has normal function. The left  ventricle has no regional wall motion  abnormalities. There is severe left ventricular hypertrophy. Left  ventricular diastolic parameters are consistent with Grade I diastolic  dysfunction (impaired relaxation).  2. Right ventricular systolic function is normal. The right ventricular  size is normal.  3. The mitral valve is abnormal. Trivial mitral valve regurgitation.  4. The aortic valve is abnormal. Aortic valve regurgitation is mild.  Mild  aortic valve sclerosis is present, with no evidence of aortic valve  stenosis.   Patient Profile     75 y.o. male with PMH CAD  s/p CABG 2006 presenting with inferior STEMI due to thrombosed SVG-PDA. PCI unsuccessful, with complete occlusion of SVG-PDA  Assessment & Plan    Acute inferior STEMI CAD s/p CABG 2006 -native three vessel occlusive CAD -LIMA-LAD patent -SVG-PDA with thrombosis, unable to to PCI, distal embolization of thrombus and total occlusion of SVG -started aspirin, clopidogrel back this admission as he has not been taking in about a month per his wife -started atorvastatin 80 mg -echo as above -phase 1 cardiac rehab complete -complicated by depression, per discussion with wife. We discussed that this is a very common comorbidity with CAD. She will call Dr. Malena PeerHagler's office today to make sure he can get an appt soon to discuss  Type II diabetes: -A1c 8.0 -given CAD, recommend SGLT2i or GLP1RA. However, he has been borderline hypotensive so will avoid potential dehydration component with SGLT2i. Wants to avoid needles, so no GLP1RA. Readdress at follow up if his BP is improved.  Hypertension -BP has been labile, 90-44-155/76 in last 24 hours. 116/79 today -restarted carvedilol 12.5 mg BID this admission -tried amlodipine, but BP low. Stopped today, no BP room for imdur either  Hypercholesterolemia: -lipids this admission show Tchol 233, TG 1010, HDL 51, LDL 162. LDL previously 61 3 years ago -LDL goal <70 -started atorvastatin 80 mg, recheck lipids as outpatient  Tobacco abuse: has been counseled on cessation  Covid-19 positive -asymptomatic, on room air -per wife, they were all covid positive about a month ago  Needs to ambulate, if he can do this without lighteadedness we will discharge later today.  For questions or updates, please contact [facility] Please consult [email or web address] for contact info under        Signed, Jodelle RedBridgette Christopher, MD  07/27/2020, 10:30 AM

## 2020-07-27 NOTE — Plan of Care (Signed)

## 2020-07-27 NOTE — Progress Notes (Signed)
Removed all IV's.  Assisted patient to the main entrance via wheelchair to meet wife.  I called patient's wife multiple times cell phone and house phone.  I was unable to reach wife.  Informed Director.  Patient states his granddaughter is at home and is able to get into his home.  Director making arrangements for patient's transportation home.

## 2020-07-27 NOTE — Progress Notes (Signed)
Called Wife to inform patient has been discharge and will meet her downstairs at the main entrance pick/dropoff.

## 2020-07-27 NOTE — Consult Note (Signed)
   [facility] Inpatient Consult   07/27/2020  [name] 06-25-1945 [id number]  [facility] [[facility]]  Accountable Care Organization [ACO] Patient: Christopher Roberts Medicare  Patient evaluated for community based chronic complex disease management services with [facility] Care Management Program as a benefit of patient's [employer]. Spoke with patient via hospital bedside phone, HIPAA verified.   Explained that the patient qualifies for complex disease management as a benefit.  Patient states he would like post hospital follow up and has given permission for wife to be contacted after he gets back home.  Patient states his primary care provider had to be changed because he [facility] Physician is not in his network any longer and his wife would have to let the nurse know who they had to change to.   Plan: Will have [facility] RN to follow up and assist patient with navigating the insurance plan and for complex disease management.  Patient will receive post hospital discharge call and will be evaluated for complex disease management.      Of note, [facility] Care Management services does not replace or interfere with any services that are arranged by inpatient case management or social work.  For additional questions or referrals please contact:    Charlesetta Shanks, RN BSN CCM [facility]  [phone] business mobile phone Toll free office [phone]  Fax number: [phone] [email or web address]  [email or web address]

## 2020-07-27 NOTE — Discharge Summary (Addendum)
Discharge Summary    Patient ID: DASTAN KRIDER MRN: [id number]; DOB: [birth year]  Admit date: 07/25/2020 Discharge date: 07/27/2020  PCP:  Aliene Beams, MD   [facility]  Cardiologist:  Peter Swaziland, MD   Discharge Diagnoses    Principal Problem:   STEMI involving right coronary artery [facility]) Active Problems:   Hyperlipidemia   Essential hypertension   Tobacco abuse   Type 2 diabetes mellitus without complication, without long-term current use of insulin (HCC)   Educated about COVID-19 virus infection   ST elevation myocardial infarction involving right coronary artery [facility])   Depression  Diagnostic Studies/Procedures    Cath 07/25/20  Prox LAD to Mid LAD lesion is 95% stenosed. 1st Diag lesion is 90% stenosed. Mid Cx lesion is 100% stenosed. Prox RCA to Dist RCA lesion is 100% stenosed. Dist RCA lesion is 100% stenosed with 100% stenosed side branch in RPDA. LIMA graft was visualized by angiography and is normal in caliber. The graft exhibits no disease. SVG graft was visualized by angiography. RPDA lesion is 95% stenosed. Mid Graft to Insertion lesion is 99% stenosed. Post intervention, there is a 100% residual stenosis. Balloon angioplasty was performed using a BALLOON SAPPHIRE 3.0X20. The left ventricular systolic function is normal. LV end diastolic pressure is normal. The left ventricular ejection fraction is 55-65% by visual estimate.   1. 3 vessel occlusive CAD.     - 95% mid LAD. 90% diffuse small first diagonal    - 100% mid LCx    - 100% proximal RCA. 2. Patent LIMA to the LAD 3. Subtotal Occlusion of the SVG to PDA with extensive thrombosis. 4. Good LV function 5. Normal LVEDP 6. Unsuccessful PCI of the SVG to PDA with distal embolization of thrombus and total occlusion of the SVG   Plan: Medical therapy. DAPT for ACS. Aggressive risk factor modification. Patient noted to be COVID positive.    _____________  Echocardiogram  07/26/20:   1. LV cavity is small with near cavity oblteration during systole. No  signficant gradients at rest. . Left ventricular ejection fraction, by  estimation, is 65 to 70%. The left ventricle has normal function. The left  ventricle has no regional wall motion   abnormalities. There is severe left ventricular hypertrophy. Left  ventricular diastolic parameters are consistent with Grade I diastolic  dysfunction (impaired relaxation).   2. Right ventricular systolic function is normal. The right ventricular  size is normal.   3. The mitral valve is abnormal. Trivial mitral valve regurgitation.   4. The aortic valve is abnormal. Aortic valve regurgitation is mild. Mild  aortic valve sclerosis is present, with no evidence of aortic valve  stenosis.    History of Present Illness     Christopher Roberts is a 75 y.o. male with a history of CAD s/p CABG in 2006 while living in [place], hx of DM2, HTN, HLD and tobacco use who presented to [facility] 07/25/20 with inferior STEMI.   He was last seen by cardiology in 2012 at which time he had an abnormal stress test. He underwent cardiac cath that showed patent LIMA to the LAD and SVG to a PDA. The native LCX was occluded in the mid vessel and the RCA was occluded. There were collaterals to the PL branches. He has been lost to follow up since that time. On presentation, he reported not taking medications for over one month. On the day of hospital presentation, he developed mid sternal chest pain without  radiation. There was no dyspnea, diaphoresis, N/V. EKG showed new ST elevation in leads 2,3 AVF c/w inferior STEMI and Code STEMI was activated.   Hospital Course    Labs on presentation revealed he was incidentally COVID positive. Initial HsT at 771>>1360. Cr stable at 0.95. Hb at 12.0. HbA1c at 8.0. LDL elevated at 162.   He was taken emergently to the cardiac cath lab with angiography revealing native three vessel occlusive CAD with LIMA>LAD patent,  SVG>PDA with thrombosis and unable to place PCI with distal embolization of thrombus and total occlusion of SVG. He was started on ASA and Plavix, high intensity atorvastatin. Amlodipine restarted at 5mg  PO QD along with carvedilol 12.5mg  BID.    The patient was seen by cardiac rehab via telephone with extensive CAD/post STEMI education with all questions answered. Cath site stable and free from hematoma or bleeding. The patient has been ambulating within his room (due to COVID)   Hospital problems include:  Acute inferior STEMI with known hx of CAD s/p CABG 2006: -native three vessel occlusive CAD -LIMA-LAD patent -SVG-PDA with thrombosis, unable to to PCI, distal embolization of thrombus and total occlusion of SVG -started aspirin, clopidogrel -started atorvastatin 80 mg -echo with normal LVEF and severe LVH -phase 1 cardiac rehab with education on the importance of ASA and Plavix. Given MI book along with heart healthy and diabetic diets. Reviewed site care, restrictions, and exercise guidelines. Smoking cessation tip sheet at bedside. Pt denies further questions or concerns at this time. Will refer to CRP II [place].    Type II diabetes: -A1c 8.0 -given CAD, recommend SGLT2i or GLP1RA. Cr 0.95.  -may be a good candidate for EMPACT-MI if he is willing to participate, awaiting echo as well   Hypertension:  -BP very labile, range 91/65-191/108 -restarted amlodipine 5 mg daily, carvedilol 12.5 mg BID -May be able to add Imdur back to regimen in the OP setting if BP stable    Hypercholesterolemia: -lipids this admission show Tchol 233, TG 1010, HDL 51, LDL 162. LDL previously 61 3 years ago -LDL goal <70 given CAD -started atorvastatin 80 mg, recheck lipids as outpatient   Tobacco abuse:  The patient was counseled on tobacco cessation today for 5 minutes.  Counseling included reviewing the risks of smoking tobacco products, how it impacts the patient's current medical diagnoses and  different strategies for quitting.  Pharmacotherapy to aid in tobacco cessation was not prescribed today. He is motivated to quit   Covid-19 positive -asymptomatic, on room air -incidentally found on admission   Depression: -reported per patient wife. Recommended follow up with PCP   Consultants: None   The patient was seen and examined by Dr. 04-13-1979 who feel that he is stable and ready for discharge today, 07/27/20.   Did the patient have an acute coronary syndrome (MI, NSTEMI, STEMI, etc) this admission?:  Yes                               AHA/ACC Clinical Performance & Quality Measures: Aspirin prescribed? - Yes ADP Receptor Inhibitor (Plavix/Clopidogrel, Brilinta/Ticagrelor or Effient/Prasugrel) prescribed (includes medically managed patients)? - Yes Beta Blocker prescribed? - Yes High Intensity Statin (Lipitor 40-80mg  or Crestor 20-40mg ) prescribed? - Yes EF assessed during THIS hospitalization? - Yes For EF <40%, was ACEI/ARB prescribed? - Not Applicable (EF >/= 40%) For EF <40%, Aldosterone Antagonist (Spironolactone or Eplerenone) prescribed? - Not Applicable (EF >/= 40%) Cardiac Rehab  Phase II ordered (including medically managed patients)? - Yes   _____________  Discharge Vitals Blood pressure 122/75, pulse 70, temperature 97.8 F (36.6 C), temperature source Oral, resp. rate 17, height  (1.753 m), weight 97.5 kg, SpO2 97 %.  Filed Weights   07/25/20 1524  Weight: 97.5 kg    Labs & Radiologic Studies    CBC Recent Labs    07/25/20 1503 07/26/20 0203  WBC 8.0 7.5  HGB 14.7 12.0*  HCT 45.1 35.2*  MCV 86.4 84.4  PLT 154 129*   Basic Metabolic Panel Recent Labs    09/81/19 1503 07/26/20 0203  NA 138 136  K 4.1 3.7  CL 108 112*  CO2 21* 18*  GLUCOSE 134* 131*  BUN 10 11  CREATININE 1.11 0.95  CALCIUM 9.0 8.3*   Liver Function Tests No results for input(s): AST, ALT, ALKPHOS, BILITOT, PROT, ALBUMIN in the last 72 hours. No results for  input(s): LIPASE, AMYLASE in the last 72 hours. High Sensitivity Troponin:   Recent Labs  Lab 07/25/20 1812 07/25/20 1952  TROPONINIHS 771* 1,360*    BNP Invalid input(s): POCBNP D-Dimer No results for input(s): DDIMER in the last 72 hours. Hemoglobin A1C Recent Labs    07/25/20 1503  HGBA1C 8.0*   Fasting Lipid Panel Recent Labs    07/25/20 1503  CHOL 233*  HDL 51  LDLCALC 162*  TRIG 101  CHOLHDL 4.6   Thyroid Function Tests No results for input(s): TSH, T4TOTAL, T3FREE, THYROIDAB in the last 72 hours.  Invalid input(s): FREET3 _____________  CARDIAC CATHETERIZATION  Result Date: 07/25/2020  Prox LAD to Mid LAD lesion is 95% stenosed.  1st Diag lesion is 90% stenosed.  Mid Cx lesion is 100% stenosed.  Prox RCA to Dist RCA lesion is 100% stenosed.  Dist RCA lesion is 100% stenosed with 100% stenosed side branch in RPDA.  LIMA graft was visualized by angiography and is normal in caliber.  The graft exhibits no disease.  SVG graft was visualized by angiography.  RPDA lesion is 95% stenosed.  Mid Graft to Insertion lesion is 99% stenosed.  Post intervention, there is a 100% residual stenosis.  Balloon angioplasty was performed using a BALLOON SAPPHIRE 3.0X20.  The left ventricular systolic function is normal.  LV end diastolic pressure is normal.  The left ventricular ejection fraction is 55-65% by visual estimate.  1. 3 vessel occlusive CAD.    - 95% mid LAD. 90% diffuse small first diagonal    - 100% mid LCx    - 100% proximal RCA. 2. Patent LIMA to the LAD 3. Subtotal Occlusion of the SVG to PDA with extensive thrombosis. 4. Good LV function 5. Normal LVEDP 6. Unsuccessful PCI of the SVG to PDA with distal embolization of thrombus and total occlusion of the SVG Plan: Medical therapy. DAPT for ACS. Aggressive risk factor modification. Patient noted to be COVID positive.   ECHOCARDIOGRAM COMPLETE  Result Date: 07/26/2020    ECHOCARDIOGRAM REPORT   Patient Name:    ATILLA ZOLLNER Date of Exam: 07/26/2020 Medical Rec #:  [id number]      Height:       69.0 in Accession #:    [id number]     Weight:       215.0 lb Date of Birth:  1946/04/02      BSA:          2.130 m Patient Age:    74 years       BP:  99/62 mmHg Patient Gender: M              HR:           76 bpm. Exam Location:  Inpatient Procedure: 2D Echo, Color Doppler and Cardiac Doppler Indications:    CAD/ STEMI  History:        Patient has prior history of Echocardiogram examinations, most                 recent 09/02/2016. Acute MI, Previous Myocardial Infarction and                 CAD, Signs/Symptoms:Murmur, Chest Pain and Shortness of Breath;                 Risk Factors:Dyslipidemia, Hypertension and Diabetes.  Sonographer:    Neomia Dear RDCS Referring Phys: [id number] PETER M Swaziland IMPRESSIONS  1. LV cavity is small with near cavity oblteration during systole. No signficant gradients at rest. . Left ventricular ejection fraction, by estimation, is 65 to 70%. The left ventricle has normal function. The left ventricle has no regional wall motion  abnormalities. There is severe left ventricular hypertrophy. Left ventricular diastolic parameters are consistent with Grade I diastolic dysfunction (impaired relaxation).  2. Right ventricular systolic function is normal. The right ventricular size is normal.  3. The mitral valve is abnormal. Trivial mitral valve regurgitation.  4. The aortic valve is abnormal. Aortic valve regurgitation is mild. Mild aortic valve sclerosis is present, with no evidence of aortic valve stenosis. FINDINGS  Left Ventricle: LV cavity is small with near cavity oblteration during systole. No signficant gradients at rest. Left ventricular ejection fraction, by estimation, is 65 to 70%. The left ventricle has normal function. The left ventricle has no regional wall motion abnormalities. The left ventricular internal cavity size was small. There is severe left ventricular hypertrophy. Left  ventricular diastolic parameters are consistent with Grade I diastolic dysfunction (impaired relaxation). Right Ventricle: The right ventricular size is normal. No increase in right ventricular wall thickness. Right ventricular systolic function is normal. Left Atrium: Left atrial size was normal in size. Right Atrium: Right atrial size was normal in size. Pericardium: There is no evidence of pericardial effusion. Mitral Valve: The mitral valve is abnormal. There is mild thickening of the mitral valve leaflet(s). Mild mitral annular calcification. Trivial mitral valve regurgitation. Tricuspid Valve: The tricuspid valve is normal in structure. Tricuspid valve regurgitation is trivial. Aortic Valve: The aortic valve is abnormal. Aortic valve regurgitation is mild. Mild aortic valve sclerosis is present, with no evidence of aortic valve stenosis. Aortic valve mean gradient measures 4.0 mmHg. Aortic valve peak gradient measures 8.6 mmHg.  Aortic valve area, by VTI measures 5.01 cm. Pulmonic Valve: The pulmonic valve was normal in structure. Pulmonic valve regurgitation is not visualized. Aorta: The aortic root and ascending aorta are structurally normal, with no evidence of dilitation. IAS/Shunts: No atrial level shunt detected by color flow Doppler.  LEFT VENTRICLE PLAX 2D LVIDd:         2.90 cm     Diastology LVIDs:         1.70 cm     LV e' medial:    3.05 cm/s LV PW:         1.60 cm     LV E/e' medial:  15.9 LV IVS:        1.80 cm     LV e' lateral:   4.90 cm/s LVOT diam:  2.30 cm     LV E/e' lateral: 9.9 LV SV:         121 LV SV Index:   57 LVOT Area:     4.15 cm  LV Volumes (MOD) LV vol d, MOD A2C: 25.8 ml LV vol d, MOD A4C: 35.9 ml LV vol s, MOD A2C: 8.6 ml LV vol s, MOD A4C: 18.9 ml LV SV MOD A2C:     17.2 ml LV SV MOD A4C:     35.9 ml LV SV MOD BP:      17.8 ml RIGHT VENTRICLE RV S prime:     12.40 cm/s TAPSE (M-mode): 1.4 cm LEFT ATRIUM             Index       RIGHT ATRIUM           Index LA diam:         3.10 cm 1.46 cm/m  RA Area:     12.10 cm LA Vol (A2C):   25.5 ml 11.97 ml/m RA Volume:   25.30 ml  11.88 ml/m LA Vol (A4C):   64.9 ml 30.46 ml/m LA Biplane Vol: 44.1 ml 20.70 ml/m  AORTIC VALVE                   PULMONIC VALVE AV Area (Vmax):    4.27 cm    PV Vmax:       0.76 m/s AV Area (Vmean):   4.06 cm    PV Vmean:      59.500 cm/s AV Area (VTI):     5.01 cm    PV VTI:        0.188 m AV Vmax:           147.00 cm/s PV Peak grad:  2.3 mmHg AV Vmean:          97.100 cm/s PV Mean grad:  2.0 mmHg AV VTI:            0.242 m AV Peak Grad:      8.6 mmHg AV Mean Grad:      4.0 mmHg LVOT Vmax:         151.00 cm/s LVOT Vmean:        94.900 cm/s LVOT VTI:          0.292 m LVOT/AV VTI ratio: 1.21  AORTA Ao Root diam: 3.50 cm Ao Asc diam:  3.20 cm MITRAL VALVE MV Area (PHT): 3.50 cm    SHUNTS MV Decel Time: 217 msec    Systemic VTI:  0.29 m MV E velocity: 48.45 cm/s  Systemic Diam: 2.30 cm MV A velocity: 70.70 cm/s MV E/A ratio:  0.69 Dietrich Pates MD Electronically signed by Dietrich Pates MD Signature Date/Time: 07/26/2020/12:55:28 PM    Final    Disposition   Pt is being discharged home today in good condition.  Follow-up Plans & Appointments    Follow-up Information     Corrin Parker, PA-C Follow up on 08/17/2020.   Specialties: Physician Assistant, Cardiology Why: at 1045am  Contact information: [address] [place] [address] [phone]                Discharge Instructions     Amb Referral to Cardiac Rehabilitation   Complete by: As directed    Diagnosis: STEMI   After initial evaluation and assessments completed: Virtual Based Care may be provided alone or in conjunction with Phase 2 Cardiac Rehab based on patient barriers.:  Yes   Call MD for:  difficulty breathing, headache or visual disturbances   Complete by: As directed    Call MD for:  extreme fatigue   Complete by: As directed    Call MD for:  hives   Complete by: As directed    Call MD for:   persistant dizziness or light-headedness   Complete by: As directed    Call MD for:  persistant nausea and vomiting   Complete by: As directed    Call MD for:  redness, tenderness, or signs of infection (pain, swelling, redness, odor or green/yellow discharge around incision site)   Complete by: As directed    Call MD for:  severe uncontrolled pain   Complete by: As directed    Call MD for:  temperature >100.4   Complete by: As directed    Diet - low sodium heart healthy   Complete by: As directed    Discharge instructions   Complete by: As directed    PLEASE DO NOT MISS ANY DOSES OF YOUR PLAVIX!!!! Also keep a log of you blood pressures and bring back to your follow up appt. Please call the office with any questions.   Patients taking blood thinners should generally stay away from medicines like ibuprofen, Advil, Motrin, naproxen, and Aleve due to risk of stomach bleeding. You may take Tylenol as directed or talk to your primary doctor about alternatives.  Some studies suggest Prilosec/Omeprazole interacts with Plavix. If you have reflux symptoms, please use Protonix for less chance of interaction  PLEASE ENSURE THAT YOU DO NOT RUN OUT OF YOUR PLAVIX. IF you have issues obtaining this medication due to cost please CALL the office 3-5 business days prior to running out in order to prevent missing doses of this medication.   No driving for 5 days. No lifting over 5 lbs for 1 week. No sexual activity for 1 week. Keep procedure site clean & dry. If you notice increased pain, swelling, bleeding or pus, call/return!  You may shower, but no soaking baths/hot tubs/pools for 1 week.   Increase activity slowly   Complete by: As directed       Discharge Medications   Allergies as of 07/27/2020   No Known Allergies      Medication List     STOP taking these medications    isosorbide mononitrate 30 MG 24 hr tablet Commonly known as: Imdur   predniSONE 10 MG tablet Commonly known as:  DELTASONE   simvastatin 80 MG tablet Commonly known as: ZOCOR       TAKE these medications    amLODipine 10 MG tablet Commonly known as: NORVASC Take 0.5 tablets (5 mg total) by mouth daily. What changed: how much to take   aspirin 81 MG tablet Take 81 mg by mouth daily.   atorvastatin 80 MG tablet Commonly known as: LIPITOR Take 1 tablet (80 mg total) by mouth daily at 6 PM.   carvedilol 12.5 MG tablet Commonly known as: COREG Take 1 tablet (12.5 mg total) by mouth 2 (two) times daily with a meal. What changed:  medication strength how much to take   clopidogrel 75 MG tablet Commonly known as: PLAVIX Take 75 mg by mouth daily.   loratadine 10 MG tablet Commonly known as: CLARITIN Take 1 tablet (10 mg total) by mouth daily.   meclizine 12.5 MG tablet Commonly known as: ANTIVERT Take 1 tablet (12.5 mg total) by mouth 3 (three) times daily as needed for dizziness.  metFORMIN 500 MG tablet Commonly known as: GLUCOPHAGE Take 500-1,000 mg by mouth 2 (two) times daily. Take one tablet in the morning and two tablets in the evening by mouth.   nitroGLYCERIN 0.4 MG SL tablet Commonly known as: Nitrostat Place 1 tablet (0.4 mg total) under the tongue every 5 (five) minutes as needed for chest pain.        Outstanding Labs/Studies   BMET, Lipid panel with LFTs   Duration of Discharge Encounter   Greater than 30 minutes including physician time.  Signed, Georgie Chard, NP 07/27/2020, 12:37 PM

## 2020-07-28 ENCOUNTER — Other Ambulatory Visit: Payer: Self-pay | Admitting: *Deleted

## 2020-07-28 ENCOUNTER — Telehealth (HOSPITAL_COMMUNITY): Payer: Self-pay

## 2020-07-28 NOTE — Telephone Encounter (Signed)
Attempted to call patient in regards to Cardiac Rehab - LM on VM 

## 2020-07-28 NOTE — Patient Outreach (Signed)
[facility]) Care Management  [facility] Care Manager  07/28/2020   Christopher Roberts [birth year] [id number]   Referral Date: 3/22 Referral Source: Hospital liaison Referral Reason: Post discharge Insurance: [employer]? (need to verify)   Outreach attempt # 1, successful to wife.  Identity verified.  This care manager introduced self and stated purpose of call.  [facility] care management services explained.    Wife immediately begins to express frustration regarding experience while member was admitted to hospital.  She has number to Patient Experience but has not called yet.  Unable to complete initial assessment as she received another call.  Plan: RN CM will await call back from wife.  Will verify [employer] and follow up within the next 3-4 business days.  [name], [place], MSN [facility] Care Management  [facility] Manager [phone]

## 2020-07-28 NOTE — Telephone Encounter (Signed)
Pt insurance is active and benefits verified through [facility]. Co-pay $45.00, DED $0.00/$0.00 met, out of pocket $4,500.00/$0.00 met, co-insurance 0%. No pre-authorization required. Passport, 07/28/20 @ 2:11PM, QUC#[id number]  Will contact patient to see if he is interested in the Cardiac Rehab Program. If interested, patient will need to complete follow up appt. Once completed, patient will be contacted for scheduling upon review by the RN Navigator.

## 2020-07-29 ENCOUNTER — Encounter (HOSPITAL_COMMUNITY): Payer: Self-pay | Admitting: Cardiology

## 2020-07-29 ENCOUNTER — Telehealth: Payer: Self-pay | Admitting: Cardiology

## 2020-07-29 ENCOUNTER — Other Ambulatory Visit: Payer: Self-pay | Admitting: *Deleted

## 2020-07-29 DIAGNOSIS — R9430 Abnormal result of cardiovascular function study, unspecified: Secondary | ICD-10-CM

## 2020-07-29 DIAGNOSIS — I213 ST elevation (STEMI) myocardial infarction of unspecified site: Secondary | ICD-10-CM

## 2020-07-29 MED ORDER — CLOPIDOGREL BISULFATE 75 MG PO TABS: 75.0000 mg | ORAL_TABLET | Freq: Every day | ORAL | 1 refills | Status: DC

## 2020-07-29 MED ORDER — NITROGLYCERIN 0.4 MG SL SUBL: 0.4000 mg | SUBLINGUAL_TABLET | SUBLINGUAL | 1 refills | Status: DC | PRN

## 2020-07-29 NOTE — Patient Outreach (Signed)
[facility]) Care Management  [facility] Care Manager  07/31/2020   Christopher Roberts Feb 11, 1946 [id number]   Referral Date: 3/22 Referral Source: Hospital liaison Referral Reason: Post discharge Insurance: [employer]  Outreach attempt #2, successful.  Identity verified.  This care manager introduced self and stated purpose of call.  [facility] care management services explained.    Social: Lives with wife and caring for 5 great grandchildren, ages 1-17.  Report he has several stressors in his life, including depression.  PHQ9 score was 23, agrees to referral to CSW.  Also having financial difficulties, agrees to speaking with Care Guide.  Denies any thoughts of hurting himself currently.  He is independent with all ADL's.  Conditions: Per chart, has history of HTN, CAD post STEMI, DM, and HLD.  Medications: Reviewed with member and wife, state they do not have Plavix, Nitro, Metformin, or Meclizine.  Calls placed to PCP office as well as cardiology to request these be sent to pharmacy.  Appointments: Visits scheduled with cardiology on 4/12 and with PCP on 3/29, wife will provide transportation.  Advance Directives:  Does not have, open to receiving information.  Consent: Agrees to involvement with [facility].   Encounter Medications:  Outpatient Encounter Medications as of 07/29/2020  Medication Sig  . amLODipine (NORVASC) 10 MG tablet Take 0.5 tablets (5 mg total) by mouth daily.  Marland Kitchen aspirin 81 MG tablet Take 81 mg by mouth daily.  Marland Kitchen atorvastatin (LIPITOR) 80 MG tablet Take 1 tablet (80 mg total) by mouth daily at 6 PM.  . carvedilol (COREG) 12.5 MG tablet Take 1 tablet (12.5 mg total) by mouth 2 (two) times daily with a meal.  . loratadine (CLARITIN) 10 MG tablet Take 1 tablet (10 mg total) by mouth daily. (Patient not taking: No sig reported)  . meclizine (ANTIVERT) 12.5 MG tablet Take 1 tablet (12.5 mg total) by mouth 3 (three) times daily as needed for dizziness. (Patient not taking:  No sig reported)  . metFORMIN (GLUCOPHAGE) 500 MG tablet Take 500-1,000 mg by mouth 2 (two) times daily. Take one tablet in the morning and two tablets in the evening by mouth. (Patient not taking: No sig reported)  . [DISCONTINUED] clopidogrel (PLAVIX) 75 MG tablet Take 75 mg by mouth daily.   (Patient not taking: No sig reported)  . [DISCONTINUED] nitroGLYCERIN (NITROSTAT) 0.4 MG SL tablet Place 1 tablet (0.4 mg total) under the tongue every 5 (five) minutes as needed for chest pain. (Patient not taking: Reported on 07/26/2020)   No facility-administered encounter medications on file as of 07/29/2020.    Functional Status:  In your present state of health, do you have any difficulty performing the following activities: 07/26/2020  Hearing? N  Vision? N  Difficulty concentrating or making decisions? N  Walking or climbing stairs? N  Dressing or bathing? N  Doing errands, shopping? N  Some recent data might be hidden    Fall/Depression Screening: Fall Risk  07/29/2020  Falls in the past year? 1  Number falls in past yr: 0  Injury with Fall? 0   PHQ 2/9 Scores 07/29/2020  PHQ - 2 Score 6  PHQ- 9 Score 23    Assessment:  Goals Addressed            This Visit's Progress   . Find Help in My Community       Timeframe:  Short-Term Goal Priority:  Medium Start Date:            3/24  Expected End Date:   4/24                    Follow Up Date  4/7   - call 211 when I need some help - follow-up on any referrals for help I am given     Why is this important?    Knowing how and where to find help for yourself or family in your neighborhood and community is an important skill.   You will want to take some steps to learn how.    Notes:   Referral placed to CSW and Care Guide    . Make and Keep All Appointments       Timeframe:  Long-Range Goal Priority:  High Start Date:           3/24                  Expected End Date:  5/24                     Follow  Up Date 4/7    - call to cancel if needed - keep a calendar with appointment dates    Why is this important?    Part of staying healthy is seeing the doctor for follow-up care.   If you forget your appointments, there are some things you can do to stay on track.    Notes:        Plan:  Follow-up:  Patient agrees to Care Plan and Follow-up.  Will place referrals to Care Guide for community resources and to CSW for depression/counseling.  Will send education regarding STEMI recovery.  Will notify PCP of involvement and follow up with member within the next 2 weeks.  [name], [place], MSN [facility] Care Management  [facility] Manager [phone]

## 2020-07-29 NOTE — Telephone Encounter (Signed)
*  STAT* If patient is at the pharmacy, call can be transferred to refill team.   1. Which medications need to be refilled? (please list name of each medication and dose if known)   clopidogrel (PLAVIX) 75 MG tablet [[id number]] nitroGLYCERIN (NITROSTAT) 0.4 MG SL tablet [[id number]  2. Which pharmacy/location (including street and city if local pharmacy) is medication to be sent to? [id number] [place], [place] - [address] [facility] [address]  [address] [place], [place] [address]  Phone:  [phone] Fax:  [phone]   3. Do they need a 30 day or 90 day supply? 30

## 2020-07-30 ENCOUNTER — Telehealth: Payer: Self-pay | Admitting: *Deleted

## 2020-07-30 NOTE — Patient Outreach (Signed)
[facility]) Care Management  07/30/2020  Christopher Roberts 1946/01/19 [id number]   CSW received referral for mental health support and attempted initial outreach today.  CSW left a HIPPA compliant voice message for pt and will await call back or try again on Monday.    Reece Levy, MSW, LCSW Clinical Social Worker  [facility] [employer] [phone]

## 2020-08-02 ENCOUNTER — Telehealth: Payer: Self-pay | Admitting: *Deleted

## 2020-08-02 NOTE — Patient Instructions (Signed)
Heart-Healthy Eating Plan Heart-healthy meal planning includes:  Eating less unhealthy fats.  Eating more healthy fats.  Making other changes in your diet. Talk with your doctor or a diet specialist (dietitian) to create an eating plan that is right for you. What is my plan? Your doctor may recommend an eating plan that includes:  Total fat: ______% or less of total calories a day.  Saturated fat: ______% or less of total calories a day.  Cholesterol: less than _________mg a day. What are tips for following this plan? Cooking Avoid frying your food. Try to bake, boil, grill, or broil it instead. You can also reduce fat by:  Removing the skin from poultry.  Removing all visible fats from meats.  Steaming vegetables in water or broth. Meal planning  At meals, divide your plate into four equal parts: ? Fill one-half of your plate with vegetables and green salads. ? Fill one-fourth of your plate with whole grains. ? Fill one-fourth of your plate with lean protein foods.  Eat 4-5 servings of vegetables per day. A serving of vegetables is: ? 1 cup of raw or cooked vegetables. ? 2 cups of raw leafy greens.  Eat 4-5 servings of fruit per day. A serving of fruit is: ? 1 medium whole fruit. ?  cup of dried fruit. ?  cup of fresh, frozen, or canned fruit. ?  cup of 100% fruit juice.  Eat more foods that have soluble fiber. These are apples, broccoli, carrots, beans, peas, and barley. Try to get 20-30 g of fiber per day.  Eat 4-5 servings of nuts, legumes, and seeds per week: ? 1 serving of dried beans or legumes equals  cup after being cooked. ? 1 serving of nuts is  cup. ? 1 serving of seeds equals 1 tablespoon.   General information  Eat more home-cooked food. Eat less restaurant, buffet, and fast food.  Limit or avoid alcohol.  Limit foods that are high in starch and sugar.  Avoid fried foods.  Lose weight if you are overweight.  Keep track of how much salt  (sodium) you eat. This is important if you have high blood pressure. Ask your doctor to tell you more about this.  Try to add vegetarian meals each week. Fats  Choose healthy fats. These include olive oil and canola oil, flaxseeds, walnuts, almonds, and seeds.  Eat more omega-3 fats. These include salmon, mackerel, sardines, tuna, flaxseed oil, and ground flaxseeds. Try to eat fish at least 2 times each week.  Check food labels. Avoid foods with trans fats or high amounts of saturated fat.  Limit saturated fats. ? These are often found in animal products, such as meats, butter, and cream. ? These are also found in plant foods, such as palm oil, palm kernel oil, and coconut oil.  Avoid foods with partially hydrogenated oils in them. These have trans fats. Examples are stick margarine, some tub margarines, cookies, crackers, and other baked goods. What foods can I eat? Fruits All fresh, canned (in natural juice), or frozen fruits. Vegetables Fresh or frozen vegetables (raw, steamed, roasted, or grilled). Green salads. Grains Most grains. Choose whole wheat and whole grains most of the time. Rice and pasta, including brown rice and pastas made with whole wheat. Meats and other proteins Lean, well-trimmed beef, veal, pork, and lamb. Chicken and turkey without skin. All fish and shellfish. Wild duck, rabbit, pheasant, and venison. Egg whites or low-cholesterol egg substitutes. Dried beans, peas, lentils, and tofu. Seeds and   most nuts. Dairy Low-fat or nonfat cheeses, including ricotta and mozzarella. Skim or 1% milk that is liquid, powdered, or evaporated. Buttermilk that is made with low-fat milk. Nonfat or low-fat yogurt. Fats and oils Non-hydrogenated (trans-free) margarines. Vegetable oils, including soybean, sesame, sunflower, olive, peanut, safflower, corn, canola, and cottonseed. Salad dressings or mayonnaise made with a vegetable oil. Beverages Mineral water. Coffee and tea. Diet  carbonated beverages. Sweets and desserts Sherbet, gelatin, and fruit ice. Small amounts of dark chocolate. Limit all sweets and desserts. Seasonings and condiments All seasonings and condiments. The items listed above may not be a complete list of foods and drinks you can eat. Contact a dietitian for more options. What foods should I avoid? Fruits Canned fruit in heavy syrup. Fruit in cream or butter sauce. Fried fruit. Limit coconut. Vegetables Vegetables cooked in cheese, cream, or butter sauce. Fried vegetables. Grains Breads that are made with saturated or trans fats, oils, or whole milk. Croissants. Sweet rolls. Donuts. High-fat crackers, such as cheese crackers. Meats and other proteins Fatty meats, such as hot dogs, ribs, sausage, bacon, rib-eye roast or steak. High-fat deli meats, such as salami and bologna. Caviar. Domestic duck and goose. Organ meats, such as liver. Dairy Cream, sour cream, cream cheese, and creamed cottage cheese. Whole-milk cheeses. Whole or 2% milk that is liquid, evaporated, or condensed. Whole buttermilk. Cream sauce or high-fat cheese sauce. Yogurt that is made from whole milk. Fats and oils Meat fat, or shortening. Cocoa butter, hydrogenated oils, palm oil, coconut oil, palm kernel oil. Solid fats and shortenings, including bacon fat, salt pork, lard, and butter. Nondairy cream substitutes. Salad dressings with cheese or sour cream. Beverages Regular sodas and juice drinks with added sugar. Sweets and desserts Frosting. Pudding. Cookies. Cakes. Pies. Milk chocolate or white chocolate. Buttered syrups. Full-fat ice cream or ice cream drinks. The items listed above may not be a complete list of foods and drinks to avoid. Contact a dietitian for more information. Summary  Heart-healthy meal planning includes eating less unhealthy fats, eating more healthy fats, and making other changes in your diet.  Eat a balanced diet. This includes fruits and  vegetables, low-fat or nonfat dairy, lean protein, nuts and legumes, whole grains, and heart-healthy oils and fats. This information is not intended to replace advice given to you by your health care provider. Make sure you discuss any questions you have with your health care provider. Document Revised: 06/28/2017 Document Reviewed: 06/01/2017 Elsevier Patient Education  2021 Elsevier Inc. Heart Attack A heart attack occurs when blood and oxygen supply to the heart is cut off. A heart attack causes damage to the heart that cannot be fixed. A heart attack is also called a myocardial infarction, or MI. If you think you are having a heart attack, do not wait to see if the symptoms will go away. Get medical help right away. What are the causes? This condition may be caused by:  A fatty substance (plaque) in the blood vessels (arteries). This can block the flow of blood to the heart.  A blood clot in the blood vessels that go to the heart. The blood clot blocks blood flow.  Low blood pressure.  An abnormal heartbeat.  Some diseases, such as problems in red blood cells (anemia)orproblems in breathing (respiratory failure).  Tightening (spasm) of a blood vessel that cuts off blood to the heart.  A tear in a blood vessel of the heart.  High blood pressure.   What increases the risk?  The following factors may make you more likely to develop this condition:  Aging. The older you are, the higher your risk.  Having a personal or family history of chest pain, heart attack, stroke, or narrowing of the arteries in the legs, arms, head, or stomach (peripheral artery disease).  Being male.  Smoking.  Not getting regular exercise.  Being overweight or obese.  Having high blood pressure.  Having high cholesterol.  Having diabetes.  Drinking too much alcohol.  Using illegal drugs, such as cocaine or methamphetamine. What are the signs or symptoms? Symptoms of this condition  include:  Chest pain. It may feel like: ? Crushing or squeezing. ? Tightness, pressure, fullness, or heaviness.  Pain in the arm, neck, jaw, back, or upper body.  Shortness of breath.  Heartburn.  Upset stomach (indigestion).  Feeling like you may vomit (nauseous).  Cold sweats.  Feeling tired.  Sudden light-headedness. How is this treated? A heart attack must be treated as soon as possible. Treatment may include:  Medicines to: ? Break up or dissolve blood clots. ? Thin blood and help prevent blood clots. ? Treat blood pressure. ? Improve blood flow to the heart. ? Reduce pain. ? Reduce cholesterol.  Procedures to widen a blocked artery and keep it open.  Open heart surgery.  Receiving oxygen.  Making your heart strong again (cardiac rehabilitation) through exercise, education, and counseling.   Follow these instructions at home: Medicines  Take over-the-counter and prescription medicines only as told by your doctor. You may need to take medicine: ? To keep your blood from clotting too easily. ? To control blood pressure. ? To lower cholesterol. ? To control heart rhythms.  Do not take these medicines unless your doctor says it is okay: ? NSAIDs, such as ibuprofen. ? Supplements that have vitamin A, vitamin E, or both. ? Hormone replacement therapy that has estrogen with or without progestin. Lifestyle  Do not use any products that have nicotine or tobacco, such as cigarettes, e-cigarettes, and chewing tobacco. If you need help quitting, ask your doctor.  Avoid secondhand smoke.  Exercise regularly. Ask your doctor about a cardiac rehab program.  Eat heart-healthy foods. Your doctor will tell you what foods to eat.  Stay at a healthy weight.  Lower your stress level.  Do not use illegal drugs.      Alcohol use  Do not drink alcohol if: ? Your doctor tells you not to drink. ? You are pregnant, may be pregnant, or are planning to become  pregnant.  If you drink alcohol: ? Limit how much you use to:  0-1 drink a day for women.  0-2 drinks a day for men. ? Know how much alcohol is in your drink. In the [place] one drink equals one 12 oz bottle of beer (355 mL), one 5 oz glass of wine (148 mL), or one 1 oz glass of hard liquor (44 mL). General instructions  Work with your doctor to treat other problems you may have, such as diabetes or high blood pressure.  Get screened for depression. Get treatment if needed.  Keep your vaccines up to date. Get the flu shot (influenza vaccine) every year.  Keep all follow-up visits as told by your doctor. This is important. Contact a doctor if:  You feel very sad.  You have trouble doing your daily activities. Get help right away if:  You have sudden, unexplained discomfort in your chest, arms, back, neck, jaw, or upper body.  You  have shortness of breath.  You have sudden sweating or clammy skin.  You feel like you may vomit.  You vomit.  You feel tired or weak.  You get light-headed or dizzy.  You feel your heart beating fast.  You feel your heart skipping beats.  You have blood pressure that is higher than 180/120. These symptoms may be an emergency. Do not wait to see if the symptoms will go away. Get medical help right away. Call your local emergency services (911 in the [place] Do not drive yourself to the hospital. Summary  A heart attack occurs when blood and oxygen supply to the heart is cut off.  Do not take NSAIDs unless your doctor says it is okay.  Do not smoke. Avoid secondhand smoke.  Exercise regularly. Ask your doctor about a cardiac rehab program. This information is not intended to replace advice given to you by your health care provider. Make sure you discuss any questions you have with your health care provider. Document Revised: 08/05/2018 Document Reviewed: 08/05/2018 Elsevier Patient Education  2021 [employer].

## 2020-08-02 NOTE — Patient Outreach (Signed)
[facility]) Care Management  08/02/2020  Christopher Roberts 04-28-1946 [id number]   CSW spoke briefly with pt this morning. Pt asked for callback around 11am.  CSW attempted again around 11am and was unable to reach him.  CSW left a HIPPA compliant voice message and will await call back or try again in 3-4 business days if no return call is received. CSW will also send an Unsuccessful Outreach letter to pt in the mail.    Reece Levy, MSW, LCSW Clinical Social Worker  [facility] [employer] [phone]

## 2020-08-03 DIAGNOSIS — I25708 Atherosclerosis of coronary artery bypass graft(s), unspecified, with other forms of angina pectoris: Secondary | ICD-10-CM | POA: Diagnosis not present

## 2020-08-03 DIAGNOSIS — I10 Essential (primary) hypertension: Secondary | ICD-10-CM | POA: Diagnosis not present

## 2020-08-03 DIAGNOSIS — Z9289 Personal history of other medical treatment: Secondary | ICD-10-CM | POA: Diagnosis not present

## 2020-08-03 DIAGNOSIS — E118 Type 2 diabetes mellitus with unspecified complications: Secondary | ICD-10-CM | POA: Diagnosis not present

## 2020-08-03 DIAGNOSIS — E538 Deficiency of other specified B group vitamins: Secondary | ICD-10-CM | POA: Diagnosis not present

## 2020-08-03 DIAGNOSIS — Z720 Tobacco use: Secondary | ICD-10-CM | POA: Diagnosis not present

## 2020-08-03 DIAGNOSIS — R69 Illness, unspecified: Secondary | ICD-10-CM | POA: Diagnosis not present

## 2020-08-04 ENCOUNTER — Ambulatory Visit: Payer: Self-pay | Admitting: *Deleted

## 2020-08-04 ENCOUNTER — Telehealth: Payer: Self-pay

## 2020-08-04 ENCOUNTER — Other Ambulatory Visit: Payer: Self-pay | Admitting: *Deleted

## 2020-08-04 NOTE — Telephone Encounter (Signed)
   Telephone encounter was:  Unsuccessful.  08/04/2020 Name: Christopher Roberts MRN: [id number] DOB: 09/04/1945  Unsuccessful outbound call made today to assist with:  food insecurity, financial security. Unable to leave message line busy voicemail did not pick-up  Outreach Attempt:  1st Attempt  Shanira Tine, AAS Paralegal, [facility] Care Guide . Embedded Care Coordination [facility] Health  Care Management  [address] [place], [place] [address] ??[email or web address]  ?? [phone]   [email or web address]

## 2020-08-04 NOTE — Patient Outreach (Addendum)
[facility]) Care Management  08/04/2020  PRATHER FAILLA [birth year] [id number]   Outgoing call placed to member/wife, no answer, HIPAA compliant voice message left.  Will follow up within the next 3-4 business days.    Update @ 1300:  Incoming call received from member's wife, state he was placed on depression meds during PCP visit yesterday.  She is unsure the name of the med but state they are unable to pay for it (cost is $57).  She would like to see if there are any resources to help financially.  [facility], notified that [facility] has Lexapro ordered, cost is $47.  Advised that CSW has been trying to get in contact with them for counseling but wasn't successful.  Provided phone number for CSW, advised to call as soon as possible.  Denies any urgent concerns, encouraged to contact this care manager with questions.  Agrees to follow up within the next 2 weeks.  Will place referral to pharmacy team.   [name], RN, MSN [facility] Care Management  [facility] Manager [phone]

## 2020-08-04 NOTE — Addendum Note (Signed)
Addended byKemper Durie on: 08/04/2020 02:23 PM   Modules accepted: Orders

## 2020-08-05 NOTE — Patient Outreach (Signed)
[facility] Customer service manager [facility]) Care Management  08/05/2020  ALDAN CAMEY 1945-08-06 [id number]   Referral request for medication assistance from [facility], RN sent to [employer].  [facility] Management Assistant [phone]

## 2020-08-06 ENCOUNTER — Telehealth: Payer: Self-pay

## 2020-08-06 NOTE — Telephone Encounter (Signed)
   Telephone encounter was:  Unsuccessful.  08/06/2020 Name: Christopher Roberts MRN: [id number] DOB: 09-Dec-1945  Unsuccessful outbound call made today to assist with:  food, utility and grand-parent support  Outreach Attempt:  2nd Attempt  A HIPAA compliant voice message was left requesting a return call.  Instructed patient to call back at [phone].  Gaylynn Seiple, AAS Paralegal, [facility] Care Guide . Embedded Care Coordination [facility] Health  Care Management  [address] [place], [place] [address] ??[email or web address]  ?? [phone]   [email or web address]

## 2020-08-09 ENCOUNTER — Telehealth: Payer: Self-pay

## 2020-08-09 NOTE — Telephone Encounter (Signed)
   Telephone encounter was:  Successful.  08/09/2020 Name: KATHERINE SYME MRN: [id number] DOB: 09/03/1945  BRADDEN TADROS is a 75 y.o. year old male who is a primary care patient of Hagler, Fleet Contras, MD . The community resource team was consulted for assistance with Spoke with patient's wife Bettie Swavely [employer] letter with information for utility, food and support for grand-parents raising grand-children.             Care guide performed the following interventions: Patient provided with information about care guide support team and interviewed to confirm resource needs Spoke with patient's wife Kayde Warehime [employer] letter with information for utility, food and support for grand-parents raising grand-children.           .  Follow Up Plan:  Care guide will follow up with patient by phone over the next 7 days  Carrigan Delafuente, AAS Paralegal, [facility] Care Guide . Embedded Care Coordination [facility] Health  Care Management  [address] [place], [place] [address] ??[email or web address]  ?? [phone]   [email or web address]

## 2020-08-10 ENCOUNTER — Telehealth: Payer: Self-pay

## 2020-08-10 NOTE — Telephone Encounter (Signed)
   Telephone encounter was:  Successful.  08/10/2020 Name: [name] MRN: [id number] DOB: 10-18-1945  Christopher Roberts is a 75 y.o. year old male who is a primary care patient of Hagler, Fleet Contras, MD . The community resource team was consulted for assistance with utility, food and support for grand-parents raising grand-children  Care guide performed the following interventions: Follow up call placed to the patient to discuss status of referral.  Follow Up Plan:  No further follow up planned at this time. The patient has been provided with needed resources. and Spoke with patient's wife she received the resources emailed to her yesterday for utility, food and support for grand-parents raising grand-children.  No further assistance is needed at this time.                             Jissel Slavens, AAS Paralegal, [facility] Care Guide . Embedded Care Coordination [facility] Health  Care Management  [address] [place], [place] [address] ??[email or web address]  ?? [phone]   [email or web address]

## 2020-08-10 NOTE — Progress Notes (Signed)
Cardiology Office Note:    Date:  08/17/2020   ID:  Christopher Roberts, DOB December 11, 1945, MRN [id number]  PCP:  Aliene Beams, MD  Cardiologist:  Peter Swaziland, MD  Electrophysiologist:  None   Referring MD: Aliene Beams, MD   Chief Complaint: hospital follow-up for STEMI  History of Present Illness:    Christopher Roberts is a 75 y.o. male with a history of CAD s/p CABG x2 in 2009 and recent inferior STEMI on 07/25/2020 with unsuccessful PCI of SVG to PADA with distal emobolization of thrombus and total occlusion of SVG, hypertension, hyperlipidemia, type 2 diabetes mellitus, and tobacco abuse who is followed by Dr. [place]iland and presents for hospital follow-up of STEMI.  Patient has a long history of CAD s/p CABG x2 in 2009 while living in [place]. He was recently admitted from 07/25/2020 to 07/27/2020 for inferior STEMI after presenting with chest pain. He had not been seen by our office since 2012 and reported he had not been taking his medications for over 1 month. High-sensitivity troponin peaked at 1,360. Cardiac catheterization showed severe native 3 vessel disease with patent LIMA to LAD but subtotal occlusion of the SVG to PDA with extensive thrombosis. Unfortunately, PCI to SVG to PDA was unsuccessful and there was distal embolization of thrombus with total occlusion of the SVG. Echo showed LVEF of 65-70% with normal wall motion, severe LVH, and grade 1 diastolic dysfunction. He was started on DAPT (Aspirin and Plavx), Coreg, Amlodipine, and Lipitor. Of note, patient was also incidentally found to have COVID during admission. He was asymptomatic with this and did not require any supplemental O2.  Patient presents today for follow-up. Here with wife. He has done well since discharge. He denies any chest pain. He states he has used 2 doses of sublingual Nitro glycerin after discharge but states this was for reflux symptoms - he go nervous so he took Nitro just to be safe. He has not done this recently.  He notes mild episodes of shortness of breath if he gets up to fast but states this does not last long. No shortness of breath at rest. No orthopnea, PND, or edema. No palpitations. He notes mild episodes of dizziness with quick position changes that resolves quickly. No syncope. He has not been very active since discharge - encouraged him to increase his activity level as tolerated.  Past Medical History:  Diagnosis Date  . CAD (coronary artery disease)    a. s/p MI in 2005 (symptom of "indigestion"); b. CABG x 3 in 3/09: L-LAD, S-OM, EF unknown  . Colon polyps   . Diabetes mellitus   . DJD (degenerative joint disease)   . HLD (hyperlipidemia)   . HTN (hypertension)   . MI (myocardial infarction) (HCC) 2005   manifested by indigestion  . Murmur    echo 6/12:  Upper septal thickening, no LVOT gradient,, EF 65%, mild LAE      Past Surgical History:  Procedure Laterality Date  . CHOLECYSTECTOMY  2002  . CORONARY ARTERY BYPASS GRAFT  2009   x3  . CORONARY/GRAFT ACUTE MI REVASCULARIZATION N/A 07/25/2020   Procedure: Coronary/Graft Acute MI Revascularization;  Surgeon: Swaziland, Peter M, MD;  Location: [facility] INVASIVE CV LAB;  Service: Cardiovascular;  Laterality: N/A;  . LEFT HEART CATH AND CORS/GRAFTS ANGIOGRAPHY N/A 07/25/2020   Procedure: LEFT HEART CATH AND CORS/GRAFTS ANGIOGRAPHY;  Surgeon: Swaziland, Peter M, MD;  Location: [facility] INVASIVE CV LAB;  Service: Cardiovascular;  Laterality: N/A;  . TONSILLECTOMY  Current Medications: Current Meds  Medication Sig  . amLODipine (NORVASC) 10 MG tablet Take 0.5 tablets (5 mg total) by mouth daily.  Marland Kitchen aspirin 81 MG tablet Take 81 mg by mouth daily.  Marland Kitchen atorvastatin (LIPITOR) 80 MG tablet Take 1 tablet (80 mg total) by mouth daily at 6 PM.  . carvedilol (COREG) 12.5 MG tablet Take 1 tablet (12.5 mg total) by mouth 2 (two) times daily with a meal.  . clopidogrel (PLAVIX) 75 MG tablet Take 1 tablet (75 mg total) by mouth daily.  . metFORMIN  (GLUCOPHAGE) 500 MG tablet Take 500-1,000 mg by mouth 2 (two) times daily. Take one tablet in the morning and two tablets in the evening by mouth.  . nicotine (NICODERM CQ - DOSED IN MG/24 HR) 7 mg/24hr patch Place 1 patch (7 mg total) onto the skin daily.  . nitroGLYCERIN (NITROSTAT) 0.4 MG SL tablet Place 1 tablet (0.4 mg total) under the tongue every 5 (five) minutes as needed for chest pain.     Allergies:   Patient has no known allergies.   Social History   Socioeconomic History  . Marital status: Legally Separated    Spouse name: Paulette  . Number of children: 2  . Years of education: 65  . Highest education level: Not on file  Occupational History  . Occupation: Retired  Tobacco Use  . Smoking status: Current Every Day Smoker    Packs/day: 0.20    Years: 40.00    Pack years: 8.00    Types: Cigarettes  . Smokeless tobacco: Never Used  . Tobacco comment: quit march 2010 after 20 yrs  Vaping Use  . Vaping Use: Never used  Substance and Sexual Activity  . Alcohol use: No  . Drug use: No  . Sexual activity: Not on file  Other Topics Concern  . Not on file  Social History Narrative   Lives with wife Paulette    Caffeine use: Coffee- 2 mugs per day   Left handed   Social Determinants of Health   Financial Resource Strain: Not on file  Food Insecurity: Food Insecurity Present  . Worried About Programme researcher, broadcasting/film/video in the Last Year: Sometimes true  . Ran Out of Food in the Last Year: Sometimes true  Transportation Needs: No Transportation Needs  . Lack of Transportation (Medical): No  . Lack of Transportation (Non-Medical): No  Physical Activity: Not on file  Stress: Not on file  Social Connections: Not on file     Family History: The patient's family history includes Colon cancer in his mother; Diabetes in his father; Heart attack in his mother; High blood pressure in his father.  ROS:   Please see the history of present illness.     EKGs/Labs/Other Studies  Reviewed:    The following studies were reviewed today:  Left Cardiac Catheterization 07/25/2020:  Prox LAD to Mid LAD lesion is 95% stenosed.  1st Diag lesion is 90% stenosed.  Mid Cx lesion is 100% stenosed.  Prox RCA to Dist RCA lesion is 100% stenosed.  Dist RCA lesion is 100% stenosed with 100% stenosed side branch in RPDA.  LIMA graft was visualized by angiography and is normal in caliber.  The graft exhibits no disease.  SVG graft was visualized by angiography.  RPDA lesion is 95% stenosed.  Mid Graft to Insertion lesion is 99% stenosed.  Post intervention, there is a 100% residual stenosis.  Balloon angioplasty was performed using a BALLOON SAPPHIRE 3.0X20.  The left  ventricular systolic function is normal.  LV end diastolic pressure is normal.  The left ventricular ejection fraction is 55-65% by visual estimate.   1. 3 vessel occlusive CAD.     - 95% mid LAD. 90% diffuse small first diagonal    - 100% mid LCx    - 100% proximal RCA. 2. Patent LIMA to the LAD 3. Subtotal Occlusion of the SVG to PDA with extensive thrombosis. 4. Good LV function 5. Normal LVEDP 6. Unsuccessful PCI of the SVG to PDA with distal embolization of thrombus and total occlusion of the SVG  Plan: Medical therapy. DAPT for ACS. Aggressive risk factor modification. Patient noted to be COVID positive.   Diagnostic Dominance: Right    Intervention     _______________  Echocardiogram 07/26/2020: Impressions: 1. LV cavity is small with near cavity oblteration during systole. No  signficant gradients at rest. . Left ventricular ejection fraction, by  estimation, is 65 to 70%. The left ventricle has normal function. The left  ventricle has no regional wall motion  abnormalities. There is severe left ventricular hypertrophy. Left  ventricular diastolic parameters are consistent with Grade I diastolic  dysfunction (impaired relaxation).  2. Right ventricular systolic  function is normal. The right ventricular  size is normal.  3. The mitral valve is abnormal. Trivial mitral valve regurgitation.  4. The aortic valve is abnormal. Aortic valve regurgitation is mild. Mild  aortic valve sclerosis is present, with no evidence of aortic valve  stenosis.   EKG:  EKG ordered today. EKG personally reviewed and demonstrates normal sinus rhythm, rate 73 bpm, with T wave inversions in inferior leads and leads V5-V6 (slightly improved from prior tracing). Normal axis. Normal PR and QRS intervals. QTc 412 ms.  Recent Labs: 07/26/2020: BUN 11; Creatinine, Ser 0.95; Hemoglobin 12.0; Platelets 129; Potassium 3.7; Sodium 136  Recent Lipid Panel    Component Value Date/Time   CHOL 233 (H) 07/25/2020 1503   TRIG 101 07/25/2020 1503   HDL 51 07/25/2020 1503   CHOLHDL 4.6 07/25/2020 1503   VLDL 20 07/25/2020 1503   LDLCALC 162 (H) 07/25/2020 1503    Physical Exam:    Vital Signs: BP 114/66   Pulse 73   Ht 5\' 9"  (1.753 m)   Wt 181 lb (82.1 kg)   BMI 26.73 kg/m     Wt Readings from Last 3 Encounters:  08/17/20 181 lb (82.1 kg)  07/25/20 215 lb (97.5 kg)  09/27/16 223 lb (101.2 kg)     General: 75 y.o. African-American male in no acute distress. HEENT: Normocephalic and atraumatic. Sclera clear. EOMs intact. Neck: Supple. No carotid bruits. No JVD. Heart: RRR. Distinct S1 and S2. No murmurs, gallops, or rubs. Radial pulses 2+ and equal bilaterally. Right femoral cath site soft with no signs of hematoma. Lungs: No increased work of breathing. Clear to ausculation bilaterally. No wheezes, rhonchi, or rales.  Abdomen: Soft, non-distended, and non-tender to palpation.  Extremities: No lower extremity edema.    Skin: Warm and dry. Neuro: Alert and oriented x3. No focal deficits. Psych: Normal affect. Responds appropriately.   Assessment:    1. Coronary artery disease involving native coronary artery of native heart without angina pectoris   2. S/P CABG  (coronary artery bypass graft)   3. Primary hypertension   4. Hyperlipidemia, unspecified hyperlipidemia type   5. Type 2 diabetes mellitus with complication, without long-term current use of insulin (HCC)   6. Medication management     Plan:  CAD s/p CABG in 2009 Recent STEMI - History of CABG x2 in 2009. Recently admitted with inferior STEMI. LHC showed patent LIMA to LAD with subtotal occlusion of the SVG to PDA with extensive thrombosis. Unfortunately, PCI to SVG to PDA was unsuccessful and there was distal embolization of thrombus with total occlusion of the SVG.  - No recurrent angina.  - Continue DAPT with Aspirin and Plavix.  - Continue beta-blocker and high-intensity statin. - Recommend patient increase activity as tolerated. Recommend  Starting Cardiac Rehab. - Will check BMET today to ensure renal function stable after cath.  Hypertension - BP well controlled. - Continue Amlodipine 5mg  daily and Coreg 125mg  twice daily.   Hyperlipidemia - Lipid panel during recent admission: Total Cholesterol 233, Triglycerides 101, HDL 51, LDL 162.  - LDL goal <70 given CAD. - Started on Lipitor 80mg  daily during recent admission. - Will need repeat lipid panel and LFTs in 2 months.  Type 2 Diabetes Mellitus - Hemoglobin A1c during recent admission 8.0. - On Metformin at home.  - Will defer management to PCP.  Tobacco Abuse - Patient has not smoked in 2 weeks. Congratulated him on this and re-emphasized the importance of complete cessation. - Will order Nicotine patches per patient's request.   Disposition: Follow up in 3-4 months with Dr. .   Medication Adjustments/Labs and Tests Ordered: Current medicines are reviewed at length with the patient today.  Concerns regarding medicines are outlined above.  Orders Placed This Encounter  Procedures  . Basic metabolic panel  . EKG 12-Lead   Meds ordered this encounter  Medications  . nicotine (NICODERM CQ - DOSED IN  MG/24 HR) 7 mg/24hr patch    Sig: Place 1 patch (7 mg total) onto the skin daily.    Dispense:  14 patch    Refill:  0    Patient Instructions  Medication Instructions:   START Nicotine patches 7 mg. Apply 1 patch once a day  *If you need a refill on your cardiac medications before your next appointment, please call your pharmacy*  Lab Work: Your physician recommends that you return for lab work TODAY:   BMET  If you have labs (blood work) drawn today and your tests are completely normal, you will receive your results only by: MyChart Message (if you have MyChart) OR . A paper copy in the mail If you have any lab test that is abnormal or we need to change your treatment, we will call you to review the results.   Testing/Procedures: NONE ordered at this time of appointment   Follow-Up: At [facility], you and your health needs are our priority.  As part of our continuing mission to provide you with exceptional heart care, we have created designated Provider Care Teams.  These Care Teams include your primary Cardiologist (physician) and Advanced Practice Providers (APPs -  Physician Assistants and Nurse Practitioners) who all work together to provide you with the care you need, when you need it.  We recommend signing up for the patient portal called "MyChart".  Sign up information is provided on this After Visit Summary.  MyChart is used to connect with patients for Virtual Visits (Telemedicine).  Patients are able to view lab/test results, encounter notes, upcoming appointments, etc.  Non-urgent messages can be sent to your provider as well.   To learn more about what you can do with MyChart, go to [place].    Your next appointment:   3-4 month(s)  The format  for your next appointment:   In Person  Provider:   Peter Jordan, MD  [place] Instructions      Signed, Corrin ParkerCallie E Morry Veiga, PA-C  08/17/2020 1:35 PM    [facility]

## 2020-08-17 ENCOUNTER — Other Ambulatory Visit: Payer: Self-pay

## 2020-08-17 ENCOUNTER — Encounter: Payer: Self-pay | Admitting: Student

## 2020-08-17 ENCOUNTER — Ambulatory Visit (INDEPENDENT_AMBULATORY_CARE_PROVIDER_SITE_OTHER): Payer: Medicare HMO | Admitting: Student

## 2020-08-17 VITALS — BP 114/66 | HR 73 | Ht 69.0 in | Wt 181.0 lb

## 2020-08-17 DIAGNOSIS — I2510 Atherosclerotic heart disease of native coronary artery without angina pectoris: Secondary | ICD-10-CM | POA: Diagnosis not present

## 2020-08-17 DIAGNOSIS — Z720 Tobacco use: Secondary | ICD-10-CM | POA: Diagnosis not present

## 2020-08-17 DIAGNOSIS — E118 Type 2 diabetes mellitus with unspecified complications: Secondary | ICD-10-CM | POA: Diagnosis not present

## 2020-08-17 DIAGNOSIS — I10 Essential (primary) hypertension: Secondary | ICD-10-CM | POA: Diagnosis not present

## 2020-08-17 DIAGNOSIS — E785 Hyperlipidemia, unspecified: Secondary | ICD-10-CM | POA: Diagnosis not present

## 2020-08-17 DIAGNOSIS — Z951 Presence of aortocoronary bypass graft: Secondary | ICD-10-CM

## 2020-08-17 DIAGNOSIS — Z79899 Other long term (current) drug therapy: Secondary | ICD-10-CM | POA: Diagnosis not present

## 2020-08-17 LAB — BASIC METABOLIC PANEL
BUN/Creatinine Ratio: 14 (ref 10–24)
BUN: 16 mg/dL (ref 8–27)
CO2: 19 mmol/L — ABNORMAL LOW (ref 20–29)
Calcium: 9.2 mg/dL (ref 8.6–10.2)
Chloride: 104 mmol/L (ref 96–106)
Creatinine, Ser: 1.11 mg/dL (ref 0.76–1.27)
Glucose: 114 mg/dL — ABNORMAL HIGH (ref 65–99)
Potassium: 4.2 mmol/L (ref 3.5–5.2)
Sodium: 139 mmol/L (ref 134–144)
eGFR: 69 mL/min/{1.73_m2} (ref >59)

## 2020-08-17 MED ORDER — NICOTINE 7 MG/24HR TD PT24: 7.0000 mg | MEDICATED_PATCH | Freq: Every day | TRANSDERMAL | 0 refills | Status: DC

## 2020-08-17 NOTE — Patient Instructions (Signed)
Medication Instructions:   START Nicotine patches 7 mg. Apply 1 patch once a day  *If you need a refill on your cardiac medications before your next appointment, please call your pharmacy*  Lab Work: Your physician recommends that you return for lab work TODAY:   BMET  If you have labs (blood work) drawn today and your tests are completely normal, you will receive your results only by: Marland Kitchen MyChart Message (if you have MyChart) OR . A paper copy in the mail If you have any lab test that is abnormal or we need to change your treatment, we will call you to review the results.   Testing/Procedures: NONE ordered at this time of appointment   Follow-Up: At [facility], you and your health needs are our priority.  As part of our continuing mission to provide you with exceptional heart care, we have created designated Provider Care Teams.  These Care Teams include your primary Cardiologist (physician) and Advanced Practice Providers (APPs -  Physician Assistants and Nurse Practitioners) who all work together to provide you with the care you need, when you need it.  We recommend signing up for the patient portal called "MyChart".  Sign up information is provided on this After Visit Summary.  MyChart is used to connect with patients for Virtual Visits (Telemedicine).  Patients are able to view lab/test results, encounter notes, upcoming appointments, etc.  Non-urgent messages can be sent to your provider as well.   To learn more about what you can do with MyChart, go to [email or web address].    Your next appointment:   3-4 month(s)  The format for your next appointment:   In Person  Provider:   Peter [place], MD  Other Instructions

## 2020-08-18 ENCOUNTER — Ambulatory Visit: Payer: Self-pay | Admitting: *Deleted

## 2020-08-18 ENCOUNTER — Telehealth: Payer: Self-pay | Admitting: *Deleted

## 2020-08-18 ENCOUNTER — Other Ambulatory Visit: Payer: Self-pay | Admitting: *Deleted

## 2020-08-18 NOTE — Patient Instructions (Signed)
How to Take Your Blood Pressure Blood pressure measures how strongly your blood is pressing against the walls of your arteries. Arteries are blood vessels that carry blood from your heart throughout your body. You can take your blood pressure at home with a machine. You may need to check your blood pressure at home:  To check if you have high blood pressure (hypertension).  To check your blood pressure over time.  To make sure your blood pressure medicine is working. Supplies needed:  Blood pressure machine, or monitor.  Dining room chair to sit in.  Table or desk.  Small notebook.  Pencil or pen. How to prepare Avoid these things for 30 minutes before checking your blood pressure:  Having drinks with caffeine in them, such as coffee or tea.  Drinking alcohol.  Eating.  Smoking.  Exercising. Do these things five minutes before checking your blood pressure:  Go to the bathroom and pee (urinate).  Sit in a dining chair. Do not sit in a soft couch or an armchair.  Be quiet. Do not talk. How to take your blood pressure Follow the instructions that came with your machine. If you have a digital blood pressure monitor, these may be the instructions: 1. Sit up straight. 2. Place your feet on the floor. Do not cross your ankles or legs. 3. Rest your left arm at the level of your heart. You may rest it on a table, desk, or chair. 4. Pull up your shirt sleeve. 5. Wrap the blood pressure cuff around the upper part of your left arm. The cuff should be 1 inch (2.5 cm) above your elbow. It is best to wrap the cuff around bare skin. 6. Fit the cuff snugly around your arm. You should be able to place only one finger between the cuff and your arm. 7. Place the cord so that it rests in the bend of your elbow. 8. Press the power button. 9. Sit quietly while the cuff fills with air and loses air. 10. Write down the numbers on the screen. 11. Wait 2-3 minutes and then repeat steps 1-10.    What do the numbers mean? Two numbers make up your blood pressure. The first number is called systolic pressure. The second is called diastolic pressure. An example of a blood pressure reading is "120 over 80" (or 120/80). If you are an adult and do not have a medical condition, use this guide to find out if your blood pressure is normal: Normal  First number: below 120.  Second number: below 80. Elevated  First number: 120-129.  Second number: below 80. Hypertension stage 1  First number: 130-139.  Second number: 80-89. Hypertension stage 2  First number: 140 or above.  Second number: 90 or above. Your blood pressure is above normal even if only the top or bottom number is above normal. Follow these instructions at home:  Check your blood pressure as often as your doctor tells you to.  Check your blood pressure at the same time every day.  Take your monitor to your next doctor's appointment. Your doctor will: ? Make sure you are using it correctly. ? Make sure it is working right.  Make sure you understand what your blood pressure numbers should be.  Tell your doctor if your medicine is causing side effects.  Keep all follow-up visits as told by your doctor. This is important. General tips:  You will need a blood pressure machine, or monitor. Your doctor can suggest a   monitor. You can buy one at a drugstore or online. When choosing one: ? Choose one with an arm cuff. ? Choose one that wraps around your upper arm. Only one finger should fit between your arm and the cuff. ? Do not choose one that measures your blood pressure from your wrist or finger. Where to find more information American Heart Association: www.heart.org Contact a doctor if:  Your blood pressure keeps being high. Get help right away if:  Your first blood pressure number is higher than 180.  Your second blood pressure number is higher than 120. Summary  Check your blood pressure at the same  time every day.  Avoid caffeine, alcohol, smoking, and exercise for 30 minutes before checking your blood pressure.  Make sure you understand what your blood pressure numbers should be. This information is not intended to replace advice given to you by your health care provider. Make sure you discuss any questions you have with your health care provider. Document Revised: 04/18/2019 Document Reviewed: 04/18/2019 Elsevier Patient Education  2021 Elsevier Inc.  

## 2020-08-18 NOTE — Patient Outreach (Signed)
[facility]) Care Management  08/18/2020  CHERYL STABENOW 1945-12-31 [id number]   Outgoing call placed to member's wife, state member is doing better.  She expresses frustration regarding completing applications for utility, food stamp, and grandparents raising kids assistance.  Encouraged to follow up with Care Guide.  Still has not followed up with CSW for counseling, encouraged to call for resources.  Report blood pressure and blood sugar has been "good" but also admit that member has not been monitoring.  Has blood glucose meter, need BP monitor.  Denies any urgent concerns, encouraged to contact this care manager with questions.  Agrees to follow up within the next month.  Goals Addressed            This Visit's Progress   . Find Help in My Community   On track    Timeframe:  Short-Term Goal Priority:  Medium Start Date:            3/24                 Expected End Date:   4/24                    Follow Up Date  4/7   - call 211 when I need some help - follow-up on any referrals for help I am given     Why is this important?    Knowing how and where to find help for yourself or family in your neighborhood and community is an important skill.   You will want to take some steps to learn how.    Notes:   3/24 - Referral placed to CSW and Care Guide  4/13 - Contact information provided to follow up with CSW and Care Guide    . Make and Keep All Appointments   On track    Timeframe:  Long-Range Goal Priority:  High Start Date:           3/24                  Expected End Date:  5/24                     Follow Up Date 4/7    - call to cancel if needed - keep a calendar with appointment dates    Why is this important?    Part of staying healthy is seeing the doctor for follow-up care.   If you forget your appointments, there are some things you can do to stay on track.    Notes:   4/13 - Reviewed recent cardiology appointment with wife,  encouraged to contact MD office with issues.      . Track and Manage My Blood Pressure-Hypertension       Timeframe:  Short-Term Goal Priority:  Medium Start Date:           4/13                  Expected End Date:  5/13                     Follow Up Date 5/11   - check blood pressure daily - write blood pressure results in a log or diary    Why is this important?    You won't feel high blood pressure, but it can still hurt your blood vessels.   High blood pressure can cause heart or kidney  problems. It can also cause a stroke.   Making lifestyle changes like losing a little weight or eating less salt will help.   Checking your blood pressure at home and at different times of the day can help to control blood pressure.   If the doctor prescribes medicine remember to take it the way the doctor ordered.   Call the office if you cannot afford the medicine or if there are questions about it.     Notes:   4/13 - Requested Admin assistant to send member blood pressure machine      [name], Charity fundraiser, MSN [facility] Care Management  [facility] Manager [phone]

## 2020-08-18 NOTE — Telephone Encounter (Signed)
CSW attempted to reach pt by phone today and was advised to call back after 5:30pm or tomorrow. CSW will attempt to reach pt tomorrow.  Reece Levy, MSW, LCSW Clinical Social Worker  [facility] [employer] [phone]

## 2020-08-19 ENCOUNTER — Other Ambulatory Visit: Payer: Self-pay | Admitting: *Deleted

## 2020-08-19 ENCOUNTER — Telehealth: Payer: Self-pay | Admitting: *Deleted

## 2020-08-19 NOTE — Patient Outreach (Signed)
[facility]) Care Management  08/19/2020  Christopher Roberts 1946-01-20 [id number]   CSW made contact with pt today and confirmed his identity. CSW introduced self, role and reason for call. 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 Per pt, they did have 9 under their care at one point.  Pt reports he and his wife are temporarily helping out their granddaughter who is in [place] trying to get settled with job and housing for her and the 5 kids.  "we do what we have to do for family".  Pt also shared with CSW that he recently suffered a heart attack (his second; had his first MI in 2006).  CSW talked with pt about the stressors he is facing and how they likely are impacting him negatively as well as the impact they may have had on the recent MI.  Pt reports having a history of depression, anxiety (with panic attack) and temper issues in past. He denies any past or current SI/HI and has been prescribed medication to help with his depression/anxiety which he plans to pick up on "next Wednesday".   CSW talked with pt about the importance of taking the medications as prescribed and allowing them time to get regulated in his system once he starts taking them. He does not know the name but thinks it is an anti-depressant.  Pt is interested in seeking counseling support as well and agrees to CSW seeking info on Providers in-network for him to consider.  He is also interested in seeking financial assistance where they can with the houshold expenses; suggestions made to him which included looking into church sponsored daycare (scholarships/free summer programs), DSS assistance, food pantry/sites-will mail info to them, and other resources through [employer]/[place].   CSW will plan to follow up with pt in 3-4 weeks with updates on resources and for followup on mailings.   Reece Levy, MSW, LCSW Clinical Social Worker  [facility] [phone]

## 2020-08-20 ENCOUNTER — Other Ambulatory Visit: Payer: Self-pay | Admitting: Student

## 2020-09-04 ENCOUNTER — Other Ambulatory Visit: Payer: Self-pay | Admitting: Student

## 2020-09-13 ENCOUNTER — Ambulatory Visit: Payer: Self-pay | Admitting: *Deleted

## 2020-09-14 ENCOUNTER — Telehealth: Payer: Self-pay | Admitting: *Deleted

## 2020-09-14 NOTE — Patient Outreach (Signed)
[facility]) Care Management  09/14/2020  Christopher Roberts 11-Feb-1946 [id number]   CSW was unable to reach pt by phone for follow up. CSW was able to leave a HIPPA compliant voice message and will await callback or try again in 3-4 business days if no return call is received.   Reece Levy, MSW, LCSW Clinical Social Worker  [facility] [phone]

## 2020-09-15 ENCOUNTER — Other Ambulatory Visit: Payer: Self-pay | Admitting: *Deleted

## 2020-09-15 NOTE — Patient Outreach (Signed)
[facility]) Care Management  09/15/2020  Christopher Roberts [birth year] [id number]   Outgoing call placed to member/wife, no answer, HIPAA compliant voice message left. Will follow up within the next 3-4 business days.  [name], [place], MSN [facility] Care Management  [facility] Manager [phone]

## 2020-09-17 ENCOUNTER — Ambulatory Visit: Payer: Self-pay | Admitting: *Deleted

## 2020-09-17 ENCOUNTER — Telehealth: Payer: Self-pay | Admitting: *Deleted

## 2020-09-17 NOTE — Patient Outreach (Signed)
[facility]) Care Management  09/17/2020  KHALIQ TURAY 1945/07/08 [id number]   CSW attempted another follow up outreach call to pt today and was unsuccessful. CSW was able to leave a HIPPA compliant voice message and will await call back. CSW will also mail pt an Unsuccessful Outreach Letter and plan a 3rd attempt per policy in the next 7  Days.   Reece Levy, MSW, LCSW Clinical Social Worker  [facility] [phone]

## 2020-09-20 ENCOUNTER — Other Ambulatory Visit: Payer: Self-pay | Admitting: *Deleted

## 2020-09-20 NOTE — Patient Outreach (Signed)
[facility]) Care Management  09/20/2020  Christopher Roberts 1946-02-11 [id number]   Outreach attempt #2, unsuccessful, HIPAA compliant voice message left.  Will send outreach letter and follow up within the next 3-4 business days.  [name], [place], MSN [facility] Care Management  [facility] Manager [phone]

## 2020-09-23 ENCOUNTER — Other Ambulatory Visit: Payer: Self-pay | Admitting: *Deleted

## 2020-09-23 NOTE — Patient Outreach (Signed)
[facility]) Care Management  09/23/2020  Christopher Roberts 06/25/1945 [id number]   Outreach attempt #3, initially successful however wife report this is not a good time to talk. State she will call back when she is able to talk.  Will await call back, if no call back will follow up with 4th and final outreach within the next 3 weeks.  [name], [place], MSN [facility] Care Management  [facility] Manager [phone]

## 2020-09-24 ENCOUNTER — Telehealth: Payer: Self-pay | Admitting: *Deleted

## 2020-09-24 ENCOUNTER — Encounter (HOSPITAL_COMMUNITY): Payer: Self-pay

## 2020-09-24 ENCOUNTER — Ambulatory Visit: Payer: Self-pay | Admitting: *Deleted

## 2020-09-24 NOTE — Telephone Encounter (Addendum)
CSW attempted to make contact with pt again and was unable.  CSW left a HIPPA compliant voice message and will await callback or try again per protocol in 30 days no return call is received.  Reece Levy, MSW, LCSW Clinical Social Worker  [facility] [phone]

## 2020-10-11 ENCOUNTER — Telehealth (HOSPITAL_COMMUNITY): Payer: Self-pay

## 2020-10-11 NOTE — Telephone Encounter (Signed)
No response from pt.  Closed referral  

## 2020-10-14 ENCOUNTER — Other Ambulatory Visit: Payer: Self-pay | Admitting: *Deleted

## 2020-10-14 NOTE — Patient Outreach (Signed)
[facility]) Care Management  10/14/2020  JAHRON HUNSINGER May 31, 1945 [id number]   Outreach attempt #4, unsuccessful, unable to leave voice message, mailbox is full.  No response from member after multiple unsuccessful outreach attempts and letter sent.  Will close case at this time due to inability to maintain contact.  Will notify member and primary MD of case closure.  [name], [place], MSN [facility] Care Management  [facility] Manager [phone]

## 2020-10-26 ENCOUNTER — Telehealth: Payer: Self-pay | Admitting: *Deleted

## 2020-10-26 ENCOUNTER — Ambulatory Visit: Payer: Self-pay | Admitting: *Deleted

## 2020-10-26 NOTE — Telephone Encounter (Signed)
CSW attempted a final outreach/followup call to pt and was unsuccessful. CSW will sign off and notify PCP and [facility] team of above. CSW will send pt an Unsuccessful Outreach letter as well.  Please re-consult if interest/needs arise.   Reece Levy, MSW, LCSW Clinical Social Worker  [facility] [phone]

## 2020-11-05 DIAGNOSIS — M25511 Pain in right shoulder: Secondary | ICD-10-CM | POA: Diagnosis not present

## 2020-11-05 DIAGNOSIS — M7501 Adhesive capsulitis of right shoulder: Secondary | ICD-10-CM | POA: Diagnosis not present

## 2020-11-05 DIAGNOSIS — E118 Type 2 diabetes mellitus with unspecified complications: Secondary | ICD-10-CM | POA: Diagnosis not present

## 2020-11-19 DIAGNOSIS — E1169 Type 2 diabetes mellitus with other specified complication: Secondary | ICD-10-CM | POA: Diagnosis not present

## 2020-11-19 DIAGNOSIS — I110 Hypertensive heart disease with heart failure: Secondary | ICD-10-CM | POA: Diagnosis not present

## 2020-11-19 DIAGNOSIS — Z008 Encounter for other general examination: Secondary | ICD-10-CM | POA: Diagnosis not present

## 2020-11-19 DIAGNOSIS — I509 Heart failure, unspecified: Secondary | ICD-10-CM | POA: Diagnosis not present

## 2020-11-19 DIAGNOSIS — Z0000 Encounter for general adult medical examination without abnormal findings: Secondary | ICD-10-CM | POA: Diagnosis not present

## 2020-11-19 DIAGNOSIS — M25511 Pain in right shoulder: Secondary | ICD-10-CM | POA: Diagnosis not present

## 2020-11-19 DIAGNOSIS — E785 Hyperlipidemia, unspecified: Secondary | ICD-10-CM | POA: Diagnosis not present

## 2020-11-19 DIAGNOSIS — I25118 Atherosclerotic heart disease of native coronary artery with other forms of angina pectoris: Secondary | ICD-10-CM | POA: Diagnosis not present

## 2020-11-19 DIAGNOSIS — F17211 Nicotine dependence, cigarettes, in remission: Secondary | ICD-10-CM | POA: Diagnosis not present

## 2020-11-25 DIAGNOSIS — M25511 Pain in right shoulder: Secondary | ICD-10-CM | POA: Diagnosis not present

## 2020-12-03 NOTE — Progress Notes (Deleted)
Cardiology Office Note:    Date:  12/03/2020   ID:  SIMPSON PAULOS, Park Meo 05/05/1946, MRN [id number]  PCP:  Aliene Beams, MD  Cardiologist:  Maddalena Linarez [place], MD  Electrophysiologist:  None   Referring MD: Aliene Beams, MD   Chief Complaint: hospital follow-up for STEMI  History of Present Illness:    Christopher Roberts is a 75 y.o. male seen for follow up CAD. He has a history of CAD s/p CABG x2 in 2009 and recent inferior STEMI on 07/25/2020 with unsuccessful PCI of SVG to PADA with distal emobolization of thrombus and total occlusion of SVG, hypertension, hyperlipidemia, type 2 diabetes mellitus, and tobacco abuse.  Patient has a long history of CAD s/p CABG x2 in 2009 while living in [place]. He was admitted from 07/25/2020 to 07/27/2020 for inferior STEMI after presenting with chest pain. He had not been seen by our office since 2012 and reported he had not been taking his medications for over 1 month. High-sensitivity troponin peaked at 1,360. Cardiac catheterization showed severe native 3 vessel disease with patent LIMA to LAD but subtotal occlusion of the SVG to PDA with extensive thrombosis. Unfortunately, PCI to SVG to PDA was unsuccessful and there was distal embolization of thrombus with total occlusion of the SVG. Echo showed LVEF of 65-70% with normal wall motion, severe LVH, and grade 1 diastolic dysfunction. He was started on DAPT (Aspirin and Plavx), Coreg, Amlodipine, and Lipitor. Of note, patient was also incidentally found to have COVID during admission. He was asymptomatic with this and did not require any supplemental O2.    Patient presents today for follow-up. Here with wife. He has done well since discharge. He denies any chest pain. He states he has used 2 doses of sublingual Nitro glycerin after discharge but states this was for reflux symptoms - he go nervous so he took Nitro just to be safe. He has not done this recently. He notes mild episodes of shortness of breath if he  gets up to fast but states this does not last long. No shortness of breath at rest. No orthopnea, PND, or edema. No palpitations. He notes mild episodes of dizziness with quick position changes that resolves quickly. No syncope. He has not been very active since discharge - encouraged him to increase his activity level as tolerated.  Past Medical History:  Diagnosis Date   CAD (coronary artery disease)    a. s/p MI in 2005 (symptom of "indigestion"); b. CABG x 3 in 3/09: L-LAD, S-OM, EF unknown   Colon polyps    Diabetes mellitus    DJD (degenerative joint disease)    HLD (hyperlipidemia)    HTN (hypertension)    MI (myocardial infarction) (HCC) 2005   manifested by indigestion   Murmur    echo 6/12:  Upper septal thickening, no LVOT gradient,, EF 65%, mild LAE      Past Surgical History:  Procedure Laterality Date   CHOLECYSTECTOMY  2002   CORONARY ARTERY BYPASS GRAFT  2009   x3   CORONARY/GRAFT ACUTE MI REVASCULARIZATION N/A 07/25/2020   Procedure: Coronary/Graft Acute MI Revascularization;  Surgeon: [place], Kriya Westra M, MD;  Location: [facility] INVASIVE CV LAB;  Service: Cardiovascular;  Laterality: N/A;   LEFT HEART CATH AND CORS/GRAFTS ANGIOGRAPHY N/A 07/25/2020   Procedure: LEFT HEART CATH AND CORS/GRAFTS ANGIOGRAPHY;  Surgeon: [place], Kathlynn Swofford M, MD;  Location: [facility] INVASIVE CV LAB;  Service: Cardiovascular;  Laterality: N/A;   TONSILLECTOMY      Current Medications:  No outpatient medications have been marked as taking for the 12/13/20 encounter (Appointment) with [place], Tenesia Escudero M, MD.     Allergies:   Patient has no known allergies.   Social History   Socioeconomic History   Marital status: Legally Separated    Spouse name: Paulette   Number of children: 2   Years of education: 12   Highest education level: Not on file  Occupational History   Occupation: Retired  Tobacco Use   Smoking status: Every Day    Packs/day: 0.20    Years: 40.00    Pack years: 8.00    Types: Cigarettes    Smokeless tobacco: Never   Tobacco comments:    quit march 2010 after 20 yrs  Vaping Use   Vaping Use: Never used  Substance and Sexual Activity   Alcohol use: No   Drug use: No   Sexual activity: Not on file  Other Topics Concern   Not on file  Social History Narrative   Lives with wife Paulette    Caffeine use: Coffee- 2 mugs per day   Left handed   Social Determinants of Health   Financial Resource Strain: Not on file  Food Insecurity: Food Insecurity Present   Worried About Running Out of Food in the Last Year: Sometimes true   Ran Out of Food in the Last Year: Sometimes true  Transportation Needs: No Transportation Needs   Lack of Transportation (Medical): No   Lack of Transportation (Non-Medical): No  Physical Activity: Not on file  Stress: Not on file  Social Connections: Not on file     Family History: The patient's family history includes Colon cancer in his mother; Diabetes in his father; Heart attack in his mother; High blood pressure in his father.  ROS:   Please see the history of present illness.     EKGs/Labs/Other Studies Reviewed:    The following studies were reviewed today:  Left Cardiac Catheterization 07/25/2020: Prox LAD to Mid LAD lesion is 95% stenosed. 1st Diag lesion is 90% stenosed. Mid Cx lesion is 100% stenosed. Prox RCA to Dist RCA lesion is 100% stenosed. Dist RCA lesion is 100% stenosed with 100% stenosed side branch in RPDA. LIMA graft was visualized by angiography and is normal in caliber. The graft exhibits no disease. SVG graft was visualized by angiography. RPDA lesion is 95% stenosed. Mid Graft to Insertion lesion is 99% stenosed. Post intervention, there is a 100% residual stenosis. Balloon angioplasty was performed using a BALLOON SAPPHIRE 3.0X20. The left ventricular systolic function is normal. LV end diastolic pressure is normal. The left ventricular ejection fraction is 55-65% by visual estimate.   1. 3 vessel  occlusive CAD.     - 95% mid LAD. 90% diffuse small first diagonal    - 100% mid LCx    - 100% proximal RCA. 2. Patent LIMA to the LAD 3. Subtotal Occlusion of the SVG to PDA with extensive thrombosis. 4. Good LV function 5. Normal LVEDP 6. Unsuccessful PCI of the SVG to PDA with distal embolization of thrombus and total occlusion of the SVG   Plan: Medical therapy. DAPT for ACS. Aggressive risk factor modification. Patient noted to be COVID positive.   Diagnostic Dominance: Right    Intervention     _______________  Echocardiogram 07/26/2020: Impressions: 1. LV cavity is small with near cavity oblteration during systole. No  signficant gradients at rest. . Left ventricular ejection fraction, by  estimation, is 65 to 70%. The left ventricle  has normal function. The left  ventricle has no regional wall motion   abnormalities. There is severe left ventricular hypertrophy. Left  ventricular diastolic parameters are consistent with Grade I diastolic  dysfunction (impaired relaxation).   2. Right ventricular systolic function is normal. The right ventricular  size is normal.   3. The mitral valve is abnormal. Trivial mitral valve regurgitation.   4. The aortic valve is abnormal. Aortic valve regurgitation is mild. Mild  aortic valve sclerosis is present, with no evidence of aortic valve  stenosis.   EKG:  EKG ordered today. EKG personally reviewed and demonstrates normal sinus rhythm, rate 73 bpm, with T wave inversions in inferior leads and leads V5-V6 (slightly improved from prior tracing). Normal axis. Normal PR and QRS intervals. QTc 412 ms.  Recent Labs: 07/26/2020: Hemoglobin 12.0; Platelets 129 08/17/2020: BUN 16; Creatinine, Ser 1.11; Potassium 4.2; Sodium 139  Recent Lipid Panel    Component Value Date/Time   CHOL 233 (H) 07/25/2020 1503   TRIG 101 07/25/2020 1503   HDL 51 07/25/2020 1503   CHOLHDL 4.6 07/25/2020 1503   VLDL 20 07/25/2020 1503   LDLCALC 162 (H)  07/25/2020 1503    Physical Exam:    Vital Signs: There were no vitals taken for this visit.    Wt Readings from Last 3 Encounters:  08/17/20 181 lb (82.1 kg)  07/25/20 215 lb (97.5 kg)  09/27/16 223 lb (101.2 kg)     General: 75 y.o. African-American male in no acute distress. HEENT: Normocephalic and atraumatic. Sclera clear. EOMs intact. Neck: Supple. No carotid bruits. No JVD. Heart: RRR. Distinct S1 and S2. No murmurs, gallops, or rubs. Radial pulses 2+ and equal bilaterally. Right femoral cath site soft with no signs of hematoma. Lungs: No increased work of breathing. Clear to ausculation bilaterally. No wheezes, rhonchi, or rales.  Abdomen: Soft, non-distended, and non-tender to palpation.  Extremities: No lower extremity edema.    Skin: Warm and dry. Neuro: Alert and oriented x3. No focal deficits. Psych: Normal affect. Responds appropriately.   Assessment:    No diagnosis found.   Plan:    CAD s/p CABG in 2009 s/p STEMI in March 2022.  - History of CABG x2 in 2009. Admitted in March with STEMI. LHC showed patent LIMA to LAD with subtotal occlusion of the SVG to PDA with extensive thrombosis. Unfortunately, PCI to SVG to PDA was unsuccessful and there was distal embolization of thrombus with total occlusion of the SVG.  - No recurrent angina.  - Continue DAPT with Aspirin and Plavix.  - Continue beta-blocker and high-intensity statin. - Recommend patient increase activity as tolerated. Recommend  Starting Cardiac Rehab. - Will check BMET today to ensure renal function stable after cath.  2. Hypertension - BP well controlled. - Continue Amlodipine 5mg  daily and Coreg 125mg  twice daily.   3. Hyperlipidemia - Lipid panel during recent admission: Total Cholesterol 233, Triglycerides 101, HDL 51, LDL 162.  - LDL goal <70 given CAD. - Started on Lipitor 80mg  daily during recent admission. - Will need repeat lipid panel and LFTs in 2 months.  4. Type 2 Diabetes  Mellitus - Hemoglobin A1c during recent admission 8.0. - On Metformin at home.  - Will defer management to PCP.  5. Tobacco Abuse - Patient has not smoked in 2 weeks. Congratulated him on this and re-emphasized the importance of complete cessation. - Will order Nicotine patches per patient's request.   Disposition:    Medication Adjustments/Labs and  Tests Ordered: Current medicines are reviewed at length with the patient today.  Concerns regarding medicines are outlined above.  No orders of the defined types were placed in this encounter.  No orders of the defined types were placed in this encounter.   There are no Patient Instructions on file for this visit.   Signed, Kemaya Dorner [place], MD  12/03/2020 8:46 AM    [facility]

## 2020-12-10 DIAGNOSIS — M25571 Pain in right ankle and joints of right foot: Secondary | ICD-10-CM | POA: Insufficient documentation

## 2020-12-10 DIAGNOSIS — G8311 Monoplegia of lower limb affecting right dominant side: Secondary | ICD-10-CM | POA: Insufficient documentation

## 2020-12-10 DIAGNOSIS — M545 Low back pain, unspecified: Secondary | ICD-10-CM | POA: Diagnosis not present

## 2020-12-13 ENCOUNTER — Ambulatory Visit: Payer: Medicare Other | Admitting: Cardiology

## 2020-12-22 DIAGNOSIS — G5731 Lesion of lateral popliteal nerve, right lower limb: Secondary | ICD-10-CM | POA: Diagnosis not present

## 2021-02-11 ENCOUNTER — Other Ambulatory Visit: Payer: Self-pay | Admitting: Cardiology

## 2022-01-27 DIAGNOSIS — F331 Major depressive disorder, recurrent, moderate: Secondary | ICD-10-CM | POA: Insufficient documentation

## 2022-01-27 DIAGNOSIS — F411 Generalized anxiety disorder: Secondary | ICD-10-CM | POA: Insufficient documentation

## 2022-02-16 DIAGNOSIS — M25569 Pain in unspecified knee: Secondary | ICD-10-CM | POA: Diagnosis not present

## 2022-02-16 DIAGNOSIS — I25708 Atherosclerosis of coronary artery bypass graft(s), unspecified, with other forms of angina pectoris: Secondary | ICD-10-CM | POA: Diagnosis not present

## 2022-02-16 DIAGNOSIS — E118 Type 2 diabetes mellitus with unspecified complications: Secondary | ICD-10-CM | POA: Diagnosis not present

## 2022-02-16 DIAGNOSIS — I10 Essential (primary) hypertension: Secondary | ICD-10-CM | POA: Diagnosis not present

## 2022-02-16 DIAGNOSIS — M25561 Pain in right knee: Secondary | ICD-10-CM | POA: Diagnosis not present

## 2022-02-16 DIAGNOSIS — M25562 Pain in left knee: Secondary | ICD-10-CM | POA: Diagnosis not present

## 2022-02-16 DIAGNOSIS — E7800 Pure hypercholesterolemia, unspecified: Secondary | ICD-10-CM | POA: Diagnosis not present

## 2022-03-20 DIAGNOSIS — M25511 Pain in right shoulder: Secondary | ICD-10-CM | POA: Diagnosis not present

## 2022-03-20 DIAGNOSIS — M25561 Pain in right knee: Secondary | ICD-10-CM | POA: Diagnosis not present

## 2022-04-12 DIAGNOSIS — M25561 Pain in right knee: Secondary | ICD-10-CM | POA: Insufficient documentation

## 2022-04-12 DIAGNOSIS — M25611 Stiffness of right shoulder, not elsewhere classified: Secondary | ICD-10-CM | POA: Insufficient documentation

## 2022-04-12 DIAGNOSIS — M25511 Pain in right shoulder: Secondary | ICD-10-CM | POA: Insufficient documentation

## 2022-09-29 ENCOUNTER — Ambulatory Visit: Payer: Medicare Other | Attending: General Practice | Admitting: General Practice

## 2022-09-29 ENCOUNTER — Encounter: Payer: Self-pay | Admitting: General Practice

## 2022-09-29 VITALS — BP 108/82 | HR 69 | Ht 69.0 in | Wt 187.8 lb

## 2022-09-29 DIAGNOSIS — E782 Mixed hyperlipidemia: Secondary | ICD-10-CM | POA: Diagnosis not present

## 2022-09-29 DIAGNOSIS — I10 Essential (primary) hypertension: Secondary | ICD-10-CM | POA: Diagnosis not present

## 2022-09-29 DIAGNOSIS — Z7984 Long term (current) use of oral hypoglycemic drugs: Secondary | ICD-10-CM

## 2022-09-29 DIAGNOSIS — I2510 Atherosclerotic heart disease of native coronary artery without angina pectoris: Secondary | ICD-10-CM | POA: Diagnosis not present

## 2022-09-29 DIAGNOSIS — R29898 Other symptoms and signs involving the musculoskeletal system: Secondary | ICD-10-CM

## 2022-09-29 DIAGNOSIS — E119 Type 2 diabetes mellitus without complications: Secondary | ICD-10-CM | POA: Diagnosis not present

## 2022-09-29 DIAGNOSIS — Z720 Tobacco use: Secondary | ICD-10-CM

## 2022-09-29 NOTE — Progress Notes (Signed)
Cardiology Clinic Note   Patient Name: Christopher Roberts Date of Encounter: 09/29/2022  Primary Care Provider:  Aliene Beams, MD Primary Cardiologist:  Peter Swaziland, MD  Patient Profile    Christopher Roberts 77 year old male presents the clinic today for follow-up evaluation of his hypertension and coronary artery disease.  Past Medical History    Past Medical History:  Diagnosis Date   CAD (coronary artery disease)    a. s/p MI in 2005 (symptom of "indigestion"); b. CABG x 3 in 3/09: L-LAD, S-OM, EF unknown   Colon polyps    Diabetes mellitus    DJD (degenerative joint disease)    HLD (hyperlipidemia)    HTN (hypertension)    MI (myocardial infarction) (HCC) 2005   manifested by indigestion   Murmur    echo 6/12:  Upper septal thickening, no LVOT gradient,, EF 65%, mild LAE     Past Surgical History:  Procedure Laterality Date   CHOLECYSTECTOMY  2002   CORONARY ARTERY BYPASS GRAFT  2009   x3   CORONARY/GRAFT ACUTE MI REVASCULARIZATION N/A 07/25/2020   Procedure: Coronary/Graft Acute MI Revascularization;  Surgeon: Swaziland, Peter M, MD;  Location: [facility] INVASIVE CV LAB;  Service: Cardiovascular;  Laterality: N/A;   LEFT HEART CATH AND CORS/GRAFTS ANGIOGRAPHY N/A 07/25/2020   Procedure: LEFT HEART CATH AND CORS/GRAFTS ANGIOGRAPHY;  Surgeon: Swaziland, Peter M, MD;  Location: [facility] INVASIVE CV LAB;  Service: Cardiovascular;  Laterality: N/A;   TONSILLECTOMY      Allergies  No Known Allergies  History of Present Illness    Christopher Roberts has a PMH of coronary artery disease status post CABG in 2009, inferior STEMI 3/22 with unsuccessful PCI of his SVG-PDA.  He had distal embolization of thrombus and total occlusion of SVG.  His PMH also includes HTN and HLD, type 2 diabetes, and tobacco abuse.  Echocardiogram showed an LVEF of 65-70%, severe LVH, G1 DD.  During his admission for STEMI he was noted to have COVID.  He was asymptomatic with his COVID infection and did not require  supplemental oxygen.  He was seen in follow-up by Marjie Skiff, PA-C on 08/17/2020.  During that time he presented with his wife.  He remained stable from a cardiac standpoint.  He denied chest pain.  He reported that he had used 2 doses of sublingual nitro after discharge however, he felt that his symptoms were related to reflux.  He felt he needed to take nitroglycerin just to be safe.  He denied recent use of nitroglycerin.  He reported mild episodes of shortness of breath with increased physical activity that was short-lived.  He denied shortness of breath at rest, PND, and orthopnea.  He denied lower extremity swelling and palpitations.  He did note mild episodes of dizziness with changing positions quickly.  His dizziness which quickly resolved.  He denied presyncope and syncope.  He was very active.  He presents to the clinic today for follow-up evaluation and states he is having trouble with strength in his legs and is now walking with a cane.  He notices more increased work of breathing with increased physical activity.  He does not have increased work of breathing with normal activities.  He continues to refrain from smoking.  He reports that he had lab work drawn this week at [facility]  I will request labs.  His EKG today shows normal sinus rhythm 69 bpm.  They request a prescription for rollator walker.  I will  provide DME prescription for this.  His blood pressure is well-controlled.  We will plan follow-up in 12 months.  Today he denies chest pain, shortness of breath, lower extremity edema, fatigue, palpitations, melena, hematuria, hemoptysis, diaphoresis, weakness, presyncope, syncope, orthopnea, and PND.    Home Medications    Prior to Admission medications   Medication Sig Start Date End Date Taking? Authorizing Provider  amLODipine (NORVASC) 10 MG tablet Take 0.5 tablets (5 mg total) by mouth daily. 07/27/20   Georgie Chard D, NP  aspirin 81 MG tablet Take 81 mg by mouth  daily.    [provider]  atorvastatin (LIPITOR) 80 MG tablet Take 1 tablet (80 mg total) by mouth daily at 6 PM. 07/27/20   Georgie Chard D, NP  carvedilol (COREG) 12.5 MG tablet TAKE 1 TABLET (12.5 MG TOTAL) BY MOUTH 2 (TWO) TIMES DAILY WITH A MEAL. 02/11/21   Swaziland, Peter M, MD  clopidogrel (PLAVIX) 75 MG tablet TAKE 1 TABLET BY MOUTH EVERY DAY 09/06/20   Marjie Skiff E, PA-C  metFORMIN (GLUCOPHAGE) 500 MG tablet Take 500-1,000 mg by mouth 2 (two) times daily. Take one tablet in the morning and two tablets in the evening by mouth.    [provider]  nicotine (NICODERM CQ - DOSED IN MG/24 HR) 7 mg/24hr patch Place 1 patch (7 mg total) onto the skin daily. 08/17/20   Corrin Parker, PA-C  nitroGLYCERIN (NITROSTAT) 0.4 MG SL tablet Place 1 tablet (0.4 mg total) under the tongue every 5 (five) minutes as needed for chest pain. 07/29/20 06/04/26  Corrin Parker, PA-C    Family History    Family History  Problem Relation Age of Onset   Colon cancer Mother    Heart attack Mother    High blood pressure Father    Diabetes Father    He indicated that his mother is deceased. He indicated that his father is alive.  Social History    Social History   Socioeconomic History   Marital status: Legally Separated    Spouse name: Paulette   Number of children: 2   Years of education: 12   Highest education level: Not on file  Occupational History   Occupation: Retired  Tobacco Use   Smoking status: Every Day    Packs/day: 0.20    Years: 40.00    Additional pack years: 0.00    Total pack years: 8.00    Types: Cigarettes   Smokeless tobacco: Never   Tobacco comments:    quit march 2010 after 20 yrs  Vaping Use   Vaping Use: Never used  Substance and Sexual Activity   Alcohol use: No   Drug use: No   Sexual activity: Not on file  Other Topics Concern   Not on file  Social History Narrative   Lives with wife Paulette    Caffeine use: Coffee- 2 mugs per day    Left handed   Social Determinants of Health   Financial Resource Strain: Not on file  Food Insecurity: Food Insecurity Present (07/29/2020)   Hunger Vital Sign    Worried About Running Out of Food in the Last Year: Sometimes true    Ran Out of Food in the Last Year: Sometimes true  Transportation Needs: No Transportation Needs (07/29/2020)   PRAPARE - Administrator, Civil Service (Medical): No    Lack of Transportation (Non-Medical): No  Physical Activity: Not on file  Stress: Not on file  Social Connections: Not  on file  Intimate Partner Violence: Not on file     Review of Systems    General:  No chills, fever, night sweats or weight changes.  Cardiovascular:  No chest pain, dyspnea on exertion, edema, orthopnea, palpitations, paroxysmal nocturnal dyspnea. Dermatological: No rash, lesions/masses Respiratory: No cough, dyspnea Urologic: No hematuria, dysuria Abdominal:   No nausea, vomiting, diarrhea, bright red blood per rectum, melena, or hematemesis Neurologic:  No visual changes, wkns, changes in mental status. All other systems reviewed and are otherwise negative except as noted above.  Physical Exam    VS:  BP 108/82   Pulse 69   Ht 5\' 9"  (1.753 m)   Wt 187 lb 12.8 oz (85.2 kg)   SpO2 97%   BMI 27.73 kg/m  , BMI Body mass index is 27.73 kg/m. GEN: Well nourished, well developed, in no acute distress. HEENT: normal. Neck: Supple, no JVD, carotid bruits, or masses. Cardiac: RRR, no murmurs, rubs, or gallops. No clubbing, cyanosis, edema.  Radials/DP/PT 2+ and equal bilaterally.  Respiratory:  Respirations regular and unlabored, clear to auscultation bilaterally. GI: Soft, nontender, nondistended, BS + x 4. MS: no deformity or atrophy. Skin: warm and dry, no rash. Neuro:  Strength and sensation are intact. Psych: Normal affect.  Accessory Clinical Findings    Recent Labs: No results found for requested labs within last 365 days.   Recent Lipid  Panel    Component Value Date/Time   CHOL 233 (H) 07/25/2020 1503   TRIG 101 07/25/2020 1503   HDL 51 07/25/2020 1503   CHOLHDL 4.6 07/25/2020 1503   VLDL 20 07/25/2020 1503   LDLCALC 162 (H) 07/25/2020 1503         ECG personally reviewed by me today-normal sinus rhythm possible inferior infarct undetermined age 100 bpm- No acute changes   Echocardiogram 07/26/2020  Impressions: 1. LV cavity is small with near cavity oblteration during systole. No  signficant gradients at rest. . Left ventricular ejection fraction, by  estimation, is 65 to 70%. The left ventricle has normal function. The left  ventricle has no regional wall motion   abnormalities. There is severe left ventricular hypertrophy. Left  ventricular diastolic parameters are consistent with Grade I diastolic  dysfunction (impaired relaxation).   2. Right ventricular systolic function is normal. The right ventricular  size is normal.   3. The mitral valve is abnormal. Trivial mitral valve regurgitation.   4. The aortic valve is abnormal. Aortic valve regurgitation is mild. Mild  aortic valve sclerosis is present, with no evidence of aortic valve  stenosis.   Cardiac Catheterization 07/25/2020:  Prox LAD to Mid LAD lesion is 95% stenosed. 1st Diag lesion is 90% stenosed. Mid Cx lesion is 100% stenosed. Prox RCA to Dist RCA lesion is 100% stenosed. Dist RCA lesion is 100% stenosed with 100% stenosed side branch in RPDA. LIMA graft was visualized by angiography and is normal in caliber. The graft exhibits no disease. SVG graft was visualized by angiography. RPDA lesion is 95% stenosed. Mid Graft to Insertion lesion is 99% stenosed. Post intervention, there is a 100% residual stenosis. Balloon angioplasty was performed using a BALLOON SAPPHIRE 3.0X20. The left ventricular systolic function is normal. LV end diastolic pressure is normal. The left ventricular ejection fraction is 55-65% by visual estimate.   1. 3  vessel occlusive CAD.     - 95% mid LAD. 90% diffuse small first diagonal    - 100% mid LCx    - 100% proximal  RCA. 2. Patent LIMA to the LAD 3. Subtotal Occlusion of the SVG to PDA with extensive thrombosis. 4. Good LV function 5. Normal LVEDP 6. Unsuccessful PCI of the SVG to PDA with distal embolization of thrombus and total occlusion of the SVG   Plan: Medical therapy. DAPT for ACS. Aggressive risk factor modification. Patient noted to be COVID positive.    Diagnostic Dominance: Right      Intervention         Assessment & Plan   1.  Coronary artery disease-no chest pain today.  Denies recent episodes of chest discomfort.  Admitted for/with STEMI 3/22.  Had unsuccessful PCI of SVG-PDA with distal embolization of thrombus and total occlusion of SVG. Continue current medical therapy Heart healthy low-sodium diet Maintain physical activity request labs from [facility]  Hyperlipidemia-lipitor compliant Continue atorvastatin, clopidogrel High-fiber diet  Essential hypertension-BP today 108/82 Maintain blood pressure log Continue low-sodium diet Continue amlodipine, carvedilol  Generalized lower extremity weakness, fatigue-having more trouble with walking longer distances. Fatigue bilateral lower extremities DME prescription for rollator walker  Type 2 diabetes-reports compliance with metformin. Follows with PCP Carb modified diet  Tobacco abuse-continues smoking cessation. Congratulated on not smoking.  Disposition: Follow-up with Dr. Swaziland or me in 12 months.   Thomasene Ripple. Baxter Gonzalez NP-C     09/29/2022, 4:16 PM [facility] [address] Office [phone] Fax [phone]    I spent 13 minutes examining this patient, reviewing medications, and using patient centered shared decision making involving her cardiac care.  Prior to her visit I spent greater than 20 minutes reviewing her past medical history,   medications, and prior cardiac tests.

## 2022-09-29 NOTE — Patient Instructions (Addendum)
Medication Instructions:   No changes  *If you need a refill on your cardiac medications before your next appointment, please call your pharmacy*   Lab Work:  Not  needed  If you have labs (blood work) drawn today and your tests are completely normal, you will receive your results only by: MyChart Message (if you have MyChart) OR A paper copy in the mail If you have any lab test that is abnormal or we need to change your treatment, we will call you to review the results.   Testing/Procedures: Not needed   Follow-Up: At [facility], you and your health needs are our priority.  As part of our continuing mission to provide you with exceptional heart care, we have created designated Provider Care Teams.  These Care Teams include your primary Cardiologist (physician) and Advanced Practice Providers (APPs -  Physician Assistants and Nurse Practitioners) who all work together to provide you with the care you need, when you need it.  We recommend signing up for the patient portal called "MyChart".  Sign up information is provided on this After Visit Summary.  MyChart is used to connect with patients for Virtual Visits (Telemedicine).  Patients are able to view lab/test results, encounter notes, upcoming appointments, etc.  Non-urgent messages can be sent to your provider as well.   To learn more about what you can do with MyChart, go to [email or web address].    Your next appointment:   12 month(s)  The format for your next appointment:   In Person  Provider:   Peter Swaziland, MD    Other Instructions

## 2022-11-29 ENCOUNTER — Other Ambulatory Visit: Payer: Self-pay | Admitting: Nurse Practitioner

## 2022-11-29 DIAGNOSIS — Z789 Other specified health status: Secondary | ICD-10-CM

## 2022-12-14 ENCOUNTER — Other Ambulatory Visit: Payer: Medicare Other

## 2023-11-26 ENCOUNTER — Other Ambulatory Visit: Payer: Self-pay | Admitting: Nurse Practitioner

## 2023-11-26 DIAGNOSIS — R197 Diarrhea, unspecified: Secondary | ICD-10-CM

## 2023-11-26 DIAGNOSIS — R634 Abnormal weight loss: Secondary | ICD-10-CM

## 2023-11-26 DIAGNOSIS — R630 Anorexia: Secondary | ICD-10-CM

## 2023-12-13 ENCOUNTER — Ambulatory Visit
Admission: RE | Admit: 2023-12-13 | Discharge: 2023-12-13 | Disposition: A | Discharge status: Home / Self Care | Source: Ambulatory Visit | Attending: Nurse Practitioner | Admitting: Nurse Practitioner

## 2023-12-13 DIAGNOSIS — R634 Abnormal weight loss: Secondary | ICD-10-CM

## 2023-12-13 DIAGNOSIS — R197 Diarrhea, unspecified: Secondary | ICD-10-CM

## 2023-12-13 DIAGNOSIS — R630 Anorexia: Secondary | ICD-10-CM

## 2023-12-26 NOTE — Progress Notes (Unsigned)
 Cardiology Clinic Note   Patient Name: Christopher Roberts Date of Encounter: 12/27/2023  Primary Care Provider:  Arloa Jarvis, NP Primary Cardiologist:  Peter Swaziland, MD  Patient Profile    Christopher Roberts 78 year old male presents the clinic today for follow-up evaluation of his hypertension and coronary artery disease.  Past Medical History    Past Medical History:  Diagnosis Date   CAD (coronary artery disease)    a. s/p MI in 2005 (symptom of indigestion); b. CABG x 3 in 3/09: L-LAD, S-OM, EF unknown   Colon polyps    Diabetes mellitus    DJD (degenerative joint disease)    HLD (hyperlipidemia)    HTN (hypertension)    MI (myocardial infarction) (HCC) 2005   manifested by indigestion   Murmur    echo 6/12:  Upper septal thickening, no LVOT gradient,, EF 65%, mild LAE     Past Surgical History:  Procedure Laterality Date   CHOLECYSTECTOMY  2002   CORONARY ARTERY BYPASS GRAFT  2009   x3   CORONARY/GRAFT ACUTE MI REVASCULARIZATION N/A 07/25/2020   Procedure: Coronary/Graft Acute MI Revascularization;  Surgeon: Swaziland, Peter M, MD;  Location: [facility] INVASIVE CV LAB;  Service: Cardiovascular;  Laterality: N/A;   LEFT HEART CATH AND CORS/GRAFTS ANGIOGRAPHY N/A 07/25/2020   Procedure: LEFT HEART CATH AND CORS/GRAFTS ANGIOGRAPHY;  Surgeon: Swaziland, Peter M, MD;  Location: [facility] INVASIVE CV LAB;  Service: Cardiovascular;  Laterality: N/A;   TONSILLECTOMY      Allergies  No Known Allergies  History of Present Illness    Christopher Roberts has a PMH of coronary artery disease status post CABG in 2009, inferior STEMI 3/22 with unsuccessful PCI of his SVG-PDA.  He had distal embolization of thrombus and total occlusion of SVG.  His PMH also includes HTN and HLD, type 2 diabetes, and tobacco abuse.  Echocardiogram showed an LVEF of 65-70%, severe LVH, G1 DD.  During his admission for STEMI he was noted to have COVID.  He was asymptomatic with his COVID infection and did not require  supplemental oxygen.  He was seen in follow-up by Aline Door, PA-C on 08/17/2020.  During that time he presented with his wife.  He remained stable from a cardiac standpoint.  He denied chest pain.  He reported that he had used 2 doses of sublingual nitro after discharge however, he felt that his symptoms were related to reflux.  He felt he needed to take nitroglycerin  just to be safe.  He denied recent use of nitroglycerin .  He reported mild episodes of shortness of breath with increased physical activity that was short-lived.  He denied shortness of breath at rest, PND, and orthopnea.  He denied lower extremity swelling and palpitations.  He did note mild episodes of dizziness with changing positions quickly.  His dizziness which quickly resolved.  He denied presyncope and syncope.  He was very active.  He presented to the clinic 09/29/22 for follow-up evaluation and stated he was having trouble with strength in his legs and was walking with a cane.  He noticed more increased work of breathing with increased physical activity.  He did not have increased work of breathing with normal activities.  He continued to refrain from smoking.  He reported that he had lab work drawn this week at [facility]  I  requested labs.  His EKG showed normal sinus rhythm 69 bpm.  They request a prescription for rollator walker.  I  provided DME  prescription for this.  His blood pressure was well-controlled.  Follow-up in 12 months.  He presents to the clinic today for follow-up evaluation and states he was recently seen by his PCP.  Patient reported dizziness at that time.  They ordered blood work and a cardiac event monitor.  His cardiac event monitor showed predominantly sinus rhythm.  He was noted to have 22 episodes of SVT.  The fastest interval was 4 beats at 143 bpm.  The longest run was 29 beats at an average rate of 102 bpm.  We reviewed this.  He and his wife expressed understanding.  He does not feel these  episodes.  It appears that his dizziness may have been related to dehydration and his blood sugar.  He was instructed to eat 3 meals daily and eat small snacks as needed.  I advised him to increase his p.o. hydration.  We reviewed vagal maneuvers.  He expressed understanding.  I will continue his current medication regimen and plan follow-up in 4 to 6 months..  Today he denies chest pain, shortness of breath, lower extremity edema, fatigue, palpitations, melena, hematuria, hemoptysis, diaphoresis, weakness, presyncope, syncope, orthopnea, and PND.    Home Medications    Prior to Admission medications   Medication Sig Start Date End Date Taking? Authorizing Provider  amLODipine  (NORVASC ) 10 MG tablet Take 0.5 tablets (5 mg total) by mouth daily. 07/27/20   McDaniel, Jill D, NP  aspirin  81 MG tablet Take 81 mg by mouth daily.    [provider]  atorvastatin  (LIPITOR ) 80 MG tablet Take 1 tablet (80 mg total) by mouth daily at 6 PM. 07/27/20   McDaniel, Jill D, NP  carvedilol  (COREG ) 12.5 MG tablet TAKE 1 TABLET (12.5 MG TOTAL) BY MOUTH 2 (TWO) TIMES DAILY WITH A MEAL. 02/11/21   Swaziland, Peter M, MD  clopidogrel  (PLAVIX ) 75 MG tablet TAKE 1 TABLET BY MOUTH EVERY DAY 09/06/20   Goodrich, Callie E, PA-C  metFORMIN  (GLUCOPHAGE ) 500 MG tablet Take 500-1,000 mg by mouth 2 (two) times daily. Take one tablet in the morning and two tablets in the evening by mouth.    [provider]  nicotine  (NICODERM CQ  - DOSED IN MG/24 HR) 7 mg/24hr patch Place 1 patch (7 mg total) onto the skin daily. 08/17/20   Goodrich, Callie E, PA-C  nitroGLYCERIN  (NITROSTAT ) 0.4 MG SL tablet Place 1 tablet (0.4 mg total) under the tongue every 5 (five) minutes as needed for chest pain. 07/29/20 06/04/26  Goodrich, Callie E, PA-C    Family History    Family History  Problem Relation Age of Onset   Colon cancer Mother    Heart attack Mother    High blood pressure Father    Diabetes Father    He indicated that his  mother is deceased. He indicated that his father is alive.  Social History    Social History   Socioeconomic History   Marital status: Legally Separated    Spouse name: Paulette   Number of children: 2   Years of education: 12   Highest education level: Not on file  Occupational History   Occupation: Retired  Tobacco Use   Smoking status: Every Day    Current packs/day: 0.20    Average packs/day: 0.2 packs/day for 40.0 years (8.0 ttl pk-yrs)    Types: Cigarettes   Smokeless tobacco: Never   Tobacco comments:    quit march 2010 after 20 yrs  Vaping Use   Vaping status: Never Used  Substance and Sexual Activity   Alcohol use: No   Drug use: No   Sexual activity: Not on file  Other Topics Concern   Not on file  Social History Narrative   Lives with wife Paulette    Caffeine use: Coffee- 2 mugs per day   Left handed   Social Drivers of Health   Financial Resource Strain: Not on file  Food Insecurity: Food Insecurity Present (07/29/2020)   Hunger Vital Sign    Worried About Running Out of Food in the Last Year: Sometimes true    Ran Out of Food in the Last Year: Sometimes true  Transportation Needs: No Transportation Needs (07/29/2020)   PRAPARE - Administrator, Civil Service (Medical): No    Lack of Transportation (Non-Medical): No  Physical Activity: Not on file  Stress: Not on file  Social Connections: Not on file  Intimate Partner Violence: Not on file     Review of Systems    General:  No chills, fever, night sweats or weight changes.  Cardiovascular:  No chest pain, dyspnea on exertion, edema, orthopnea, palpitations, paroxysmal nocturnal dyspnea. Dermatological: No rash, lesions/masses Respiratory: No cough, dyspnea Urologic: No hematuria, dysuria Abdominal:   No nausea, vomiting, diarrhea, bright red blood per rectum, melena, or hematemesis Neurologic:  No visual changes, wkns, changes in mental status. All other systems reviewed and are  otherwise negative except as noted above.  Physical Exam    VS:  BP 108/66   Pulse 60   Ht 5' 9 (1.753 m)   Wt 171 lb 6.4 oz (77.7 kg)   SpO2 99%   BMI 25.31 kg/m  , BMI Body mass index is 25.31 kg/m. GEN: Well nourished, well developed, in no acute distress. HEENT: normal. Neck: Supple, no JVD, carotid bruits, or masses. Cardiac: RRR, no murmurs, rubs, or gallops. No clubbing, cyanosis, edema.  Radials/DP/PT 2+ and equal bilaterally.  Respiratory:  Respirations regular and unlabored, clear to auscultation bilaterally. GI: Soft, nontender, nondistended, BS + x 4. MS: no deformity or atrophy. Skin: warm and dry, no rash. Neuro:  Strength and sensation are intact. Psych: Normal affect.  Accessory Clinical Findings    Recent Labs: No results found for requested labs within last 365 days.   Recent Lipid Panel    Component Value Date/Time   CHOL 233 (H) 07/25/2020 1503   TRIG 101 07/25/2020 1503   HDL 51 07/25/2020 1503   CHOLHDL 4.6 07/25/2020 1503   VLDL 20 07/25/2020 1503   LDLCALC 162 (H) 07/25/2020 1503         ECG personally reviewed by me today-EKG Interpretation Date/Time:  Thursday December 27 2023 11:22:09 EDT Ventricular Rate:  68 PR Interval:  142 QRS Duration:  82 QT Interval:  412 QTC Calculation: 438 R Axis:   40  Text Interpretation: Normal sinus rhythm Nonspecific T wave abnormality When compared with ECG of 27-Jul-2020 07:57, T wave inversion no longer evident in Inferior leads Nonspecific T wave abnormality has replaced inverted T waves in Lateral leads Confirmed by Emelia Hazy [phone]) on 12/27/2023 11:35:53 AM   EKG 09/29/2022 normal sinus rhythm possible inferior infarct undetermined age 33 bpm- No acute changes   Echocardiogram 07/26/2020  Impressions: 1. LV cavity is small with near cavity oblteration during systole. No  signficant gradients at rest. . Left ventricular ejection fraction, by  estimation, is 65 to 70%. The left ventricle  has normal function. The left  ventricle has no regional wall motion  abnormalities. There is severe left ventricular hypertrophy. Left  ventricular diastolic parameters are consistent with Grade I diastolic  dysfunction (impaired relaxation).   2. Right ventricular systolic function is normal. The right ventricular  size is normal.   3. The mitral valve is abnormal. Trivial mitral valve regurgitation.   4. The aortic valve is abnormal. Aortic valve regurgitation is mild. Mild  aortic valve sclerosis is present, with no evidence of aortic valve  stenosis.   Cardiac Catheterization 07/25/2020:  Prox LAD to Mid LAD lesion is 95% stenosed. 1st Diag lesion is 90% stenosed. Mid Cx lesion is 100% stenosed. Prox RCA to Dist RCA lesion is 100% stenosed. Dist RCA lesion is 100% stenosed with 100% stenosed side branch in RPDA. LIMA graft was visualized by angiography and is normal in caliber. The graft exhibits no disease. SVG graft was visualized by angiography. RPDA lesion is 95% stenosed. Mid Graft to Insertion lesion is 99% stenosed. Post intervention, there is a 100% residual stenosis. Balloon angioplasty was performed using a BALLOON SAPPHIRE 3.0X20. The left ventricular systolic function is normal. LV end diastolic pressure is normal. The left ventricular ejection fraction is 55-65% by visual estimate.   1. 3 vessel occlusive CAD.     - 95% mid LAD. 90% diffuse small first diagonal    - 100% mid LCx    - 100% proximal RCA. 2. Patent LIMA to the LAD 3. Subtotal Occlusion of the SVG to PDA with extensive thrombosis. 4. Good LV function 5. Normal LVEDP 6. Unsuccessful PCI of the SVG to PDA with distal embolization of thrombus and total occlusion of the SVG   Plan: Medical therapy. DAPT for ACS. Aggressive risk factor modification. Patient noted to be COVID positive.    Diagnostic Dominance: Right      Intervention         Assessment & Plan   1.  Coronary artery  disease-denies recent exertional chest discomfort.  Fairly sedentary.  History of STEMI 3/22.  He had unsuccessful PCI of SVG-PDA with distal embolization of thrombus and total occlusion of SVG. Continue amlodipine , aspirin , atorvastatin , carvedilol , Plavix , sublingual nitroglycerin  Heart healthy low-sodium diet-reviewed Maintain physical activity  Essential hypertension-BP today 108/66 Maintain blood pressure log Continue low-sodium diet Continue amlodipine , carvedilol   Hyperlipidemia-compliant with statin therapy. Continue atorvastatin , clopidogrel  High-fiber diet Follows with PCP  SVT-asymptomatic.  Wore cardiac event monitor that was placed by his PCP.  He was noted to have 22 episodes of SVT.  Longest episode lasted for 29 beats at a rate of 102 bpm.  The fastest interval was 4 beats at a rate of 143 bpm. Continue carvedilol  Maintain p.o. hydration Avoid triggers May use vagal maneuvers  Type 2 diabetes-he continues compliance with metformin . Follows with PCP Carb modified diet  Tobacco abuse-continues to refrain from smoking. Congratulated and continued cessation encouraged  Disposition: Follow-up with Dr. Swaziland or me in 4-6 months.   Josefa HERO. Netta Fodge NP-C     12/27/2023, 12:02 PM [facility] [address] Office [phone] Fax [phone]    I spent 15 minutes examining this patient, reviewing medications, and using patient centered shared decision making involving her cardiac care.  Prior to her visit I spent greater than 20 minutes reviewing her past medical history,  medications, and prior cardiac tests.

## 2023-12-27 ENCOUNTER — Ambulatory Visit: Attending: Cardiology | Admitting: General Practice

## 2023-12-27 ENCOUNTER — Encounter: Payer: Self-pay | Admitting: General Practice

## 2023-12-27 VITALS — BP 108/66 | HR 60 | Ht 69.0 in | Wt 171.4 lb

## 2023-12-27 DIAGNOSIS — I2510 Atherosclerotic heart disease of native coronary artery without angina pectoris: Secondary | ICD-10-CM

## 2023-12-27 DIAGNOSIS — I10 Essential (primary) hypertension: Secondary | ICD-10-CM | POA: Diagnosis not present

## 2023-12-27 DIAGNOSIS — Z720 Tobacco use: Secondary | ICD-10-CM | POA: Diagnosis not present

## 2023-12-27 DIAGNOSIS — E782 Mixed hyperlipidemia: Secondary | ICD-10-CM | POA: Diagnosis not present

## 2023-12-27 DIAGNOSIS — E119 Type 2 diabetes mellitus without complications: Secondary | ICD-10-CM

## 2023-12-27 NOTE — Patient Instructions (Signed)
 Medication Instructions:  Your physician recommends that you continue on your current medications as directed. Please refer to the Current Medication list given to you today.  *If you need a refill on your cardiac medications before your next appointment, please call your pharmacy*  Lab Work: NONE If you have labs (blood work) drawn today and your tests are completely normal, you will receive your results only by: MyChart Message (if you have MyChart) OR A paper copy in the mail If you have any lab test that is abnormal or we need to change your treatment, we will call you to review the results.  Testing/Procedures: NONE  Follow-Up: At [facility], you and your health needs are our priority.  As part of our continuing mission to provide you with exceptional heart care, our providers are all part of one team.  This team includes your primary Cardiologist (physician) and Advanced Practice Providers or APPs (Physician Assistants and Nurse Practitioners) who all work together to provide you with the care you need, when you need it.  Your next appointment:   4-6 month(s)  Provider:   Peter [place], MD    We recommend signing up for the patient portal called MyChart.  Sign up information is provided on this After Visit Summary.  MyChart is used to connect with patients for Virtual Visits (Telemedicine).  Patients are able to view lab/test results, encounter notes, upcoming appointments, etc.  Non-urgent messages can be sent to your provider as well.   To learn more about what you can do with MyChart, go to [email or web address].   Other Instructions Supraventricular Tachycardia, Adult Supraventricular tachycardia (SVT) is a kind of abnormal heartbeat. It makes your heart beat very fast. This may last for just a short time. Or it may last longer and need treatment to get the heartbeat to go back to normal. A normal resting heartbeat is 60-100 times a minute. SVT can make your heart  beat more than 150 times a minute.  The times when you have a fast heartbeat--or episodes of SVT--can be scary. But they're usually not dangerous. In some cases, SVT can lead to other heart problems. What are the causes?  SVT happens when electrical signals are sent out from areas of the upper part of the heart that don't normally send heartbeat signals. What increases the risk? You are more likely to get SVT if you are: Middle aged or older. Male. Other things that may increase your risk include: Having stress or feeling worried or nervous. Using illegal drugs such as cocaine or methamphetamine. Using over-the-counter cough or cold medicines. Stimulant drugs, such as caffeine. Smoking or alcohol use. Having any of these conditions: A thyroid condition. Diabetes. Obstructive sleep apnea. What are the signs or symptoms? A pounding heartbeat. Feeling fast or irregular heartbeats called palpitations. Shortness of breath. Weakness and tiredness. Tightness or pain in your chest. Feeling light-headed or dizzy. Feeling worried or nervous. Sometimes there are no symptoms. How is this diagnosed? This condition may be diagnosed based on: Your symptoms and a physical exam. Your health care provider will listen to your heart and feel your pulse. Tests. These may include: An electrocardiogram (ECG). This test checks for problems with electrical activity in the heart. A Holter monitor or event monitor test. For this test, you'll wear a device that monitors your heart rate over time. An echocardiogram. This test uses sound waves to make a picture of your heart. A stress echocardiogram. An echocardiogram is done when you are  at rest and after exercise. Blood tests. An electrophysiology study (EPS). This tests the electrical activity in your heart. It helps find where the abnormal heart rhythm is coming from. How is this treated? Treatment may include: Vagal nerve stimulation. Doing things  to stimulate a nerve called the vagus nerve can slow down the heartbeat. Work with your provider to find which technique works best for you. Ways to do this include: Holding your breath and pushing, as though you are pooping. Cough Hard  Put cold water on your face Medicines that prevent attacks. Medicine to stop an attack. This is given through an IV at the hospital. Cardioversion. This uses a small electric shock to stop an attack. Catheter ablation. This is a procedure to get rid of cells in the area that's causing the fast heartbeats. If you don't have symptoms, you may not need treatment. Follow these instructions at home: Stress Avoid things that make you feel stressed. Find healthy ways to deal with stress, such as: Doing yoga or meditation. Going out in nature. Listening to relaxing music. Taking steps to be healthy, such as getting lots of sleep, exercising, and eating a balanced diet. Talking with a mental health provider. Lifestyle Try to get at least 7 hours of sleep each night. Do not smoke, vape, or use products with nicotine  or tobacco in them. If you need help quitting, talk with your provider. Do not use illegal drugs. If you need help quitting, talk with your provider. Do not drink alcohol if it gives you a fast heartbeat. If alcohol does not seem to give you a fast heartbeat, limit your alcohol use. If you drink alcohol: Limit how much you have to: 0-1 drink a day if you are male. 0-2 drinks a day if you are male. Know how much alcohol is in your drink. In the [place] one drink is one 12 oz bottle of beer (355 mL), one 5 oz glass of wine (148 mL), or one 1 oz glass of hard liquor (44 mL). Be aware of how caffeine affects you. If caffeine gives you a fast heartbeat, do not eat, drink, or use anything with caffeine in it. If caffeine doesn't seem to give you a fast heartbeat, limit how much caffeine you have. General instructions Stay at a healthy weight. Exercise  often. Ask your provider about good activities for you. Try one or a mixture of these: 150 minutes a week of gentle exercise, like walking or yoga. 75 minutes a week of exercise that is very active, like running or swimming. Do vagal nerve stimulation only as told by your provider. Take medicines only as told by your provider. Keep all follow-up visits. Your provider will want to make sure your treatments are working. Contact a health care provider if: You have a fast heartbeat more often. The feeling that your heart is beating fast lasts longer than before. Home treatments to slow down your heartbeat do not help. You have new symptoms. Get help right away if: You have chest pain. Your heart beats very fast for more than 20 minutes. You have trouble breathing. You faint. Your symptoms get worse. These symptoms may be an emergency. Call 911 right away. Do not wait to see if the symptoms will go away. Do not drive yourself to the hospital. This information is not intended to replace advice given to you by your health care provider. Make sure you discuss any questions you have with your health care provider. Document Revised: 01/25/2023 Document  Reviewed: 06/13/2022 Elsevier Patient Education  2024 [employer]  Iron-Rich Diet  Iron is a mineral that helps your body produce hemoglobin. Hemoglobin is a protein in red blood cells that carries oxygen to your body's tissues. Eating too little iron may cause you to feel weak and tired, and it can increase your risk of infection. Iron is naturally found in many foods, and many foods have iron added to them (are iron-fortified). You may need to follow an iron-rich diet if you do not have enough iron in your body due to certain medical conditions. The amount of iron that you need each day depends on your age, your sex, and any medical conditions you have. Follow instructions from your health care provider or a dietitian about how much iron you  should eat each day. What are tips for following this plan? Reading food labels Check food labels to see how many milligrams (mg) of iron are in each serving. Cooking Cook foods in pots and pans that are made from iron. Take these steps to make it easier for your body to absorb iron from certain foods: Soak beans overnight before cooking. Soak whole grains overnight and drain them before using. Ferment flours before baking, such as by using yeast in bread dough. Meal planning When you eat foods that contain iron, you should eat them with foods that are high in vitamin C. These include oranges, peppers, tomatoes, potatoes, and mangoes. Vitamin C helps your body absorb iron. Certain foods and drinks prevent your body from absorbing iron properly. Avoid eating these foods in the same meal as iron-rich foods or with iron supplements. These foods include: Coffee, black tea, and red wine. Milk, dairy products, and foods that are high in calcium . Beans and soybeans. Whole grains. General information Take iron supplements only as told by your health care provider. An overdose of iron can be life-threatening. If you were prescribed iron supplements, take them with orange juice or a vitamin C supplement. When you eat iron-fortified foods or take an iron supplement, you should also eat foods that naturally contain iron, such as meat, poultry, and fish. Eating naturally iron-rich foods helps your body absorb the iron that is added to other foods or contained in a supplement. Iron from animal sources is better absorbed than iron from plant sources. What foods should I eat? Vegetables Spinach (cooked). Green peas. Broccoli. Fermented vegetables. Eat vegetables high in vitamin C, such as leafy greens, potatoes, bell peppers, and tomatoes, with iron-rich foods. Grains Iron-fortified breakfast cereal. Iron-fortified whole-wheat bread. Enriched rice. Sprouted grains. Meats and other proteins Beef liver.  Beef. [place]. Chicken. Oysters. Shrimp. Tuna. Sardines. Chickpeas. Nuts. Tofu. Pumpkin seeds. Beverages Tomato juice. Fresh orange juice. Prune juice. Hibiscus tea. Iron-fortified instant breakfast shakes. Sweets and desserts Blackstrap molasses. Seasonings and condiments Tahini. Fermented soy sauce. Other foods Wheat germ. The items listed above may not be a complete list of recommended foods and beverages. Contact a dietitian for more information. What foods should I limit? These are foods that should be limited while eating iron-rich foods as they can reduce the absorption of iron in your body. Grains Whole grains. Bran cereal. Bran flour. Meats and other proteins Soybeans. Products made from soy protein. Black beans. Lentils. Mung beans. Split peas. Dairy Milk. Cream. Cheese. Yogurt. Cottage cheese. Beverages Coffee. Black tea. Red wine. Sweets and desserts Cocoa. Chocolate. Ice cream. Seasonings and condiments Basil. Oregano. Large amounts of parsley. The items listed above may not be a complete list  of foods and beverages you should limit. Contact a dietitian for more information. Summary Iron is a mineral that helps your body produce hemoglobin. Hemoglobin is a protein in red blood cells that carries oxygen to your body's tissues. Iron is naturally found in many foods, and many foods have iron added to them (are iron-fortified). When you eat foods that contain iron, you should eat them with foods that are high in vitamin C. Vitamin C helps your body absorb iron. Certain foods and drinks prevent your body from absorbing iron properly, such as whole grains and dairy products. You should avoid eating these foods in the same meal as iron-rich foods or with iron supplements. This information is not intended to replace advice given to you by your health care provider. Make sure you discuss any questions you have with your health care provider. Document Revised: 04/08/2023 Document  Reviewed: 04/08/2023 Elsevier Patient Education  2025 [employer].     Exercise regularly as told by your doctor. Make sure to weight daily and keep a weight log.   Moderate-intensity exercise is any activity that gets you moving enough to burn at least three times more energy (calories) than if you were sitting. Examples of moderate exercise include: Walking a mile in 15 minutes. Doing light yard work. Biking at an easy pace. Most people should get at least 150 minutes of moderate-intensity exercise a week to maintain their body weight.  Increase your water intake: Maintain hydration

## 2024-01-03 ENCOUNTER — Telehealth: Payer: Self-pay

## 2024-01-03 NOTE — Telephone Encounter (Signed)
 Spoke to patient's wife I received a message to schedule husband a follow up appointment with Dr.Jordan in 4 to 6 months.Appointment scheduled with Dr.Jordan 12/1 at 9:00 am.

## 2024-03-12 ENCOUNTER — Encounter (HOSPITAL_BASED_OUTPATIENT_CLINIC_OR_DEPARTMENT_OTHER): Payer: Self-pay | Admitting: Family Medicine

## 2024-03-12 ENCOUNTER — Encounter (HOSPITAL_BASED_OUTPATIENT_CLINIC_OR_DEPARTMENT_OTHER): Payer: Self-pay

## 2024-03-12 ENCOUNTER — Ambulatory Visit (INDEPENDENT_AMBULATORY_CARE_PROVIDER_SITE_OTHER): Admitting: Family Medicine

## 2024-03-12 VITALS — BP 89/54 | HR 89 | Temp 97.8°F | Resp 18 | Ht 69.0 in | Wt 168.0 lb

## 2024-03-12 DIAGNOSIS — I10 Essential (primary) hypertension: Secondary | ICD-10-CM | POA: Diagnosis not present

## 2024-03-12 DIAGNOSIS — E7800 Pure hypercholesterolemia, unspecified: Secondary | ICD-10-CM

## 2024-03-12 DIAGNOSIS — K7460 Unspecified cirrhosis of liver: Secondary | ICD-10-CM | POA: Insufficient documentation

## 2024-03-12 DIAGNOSIS — E1165 Type 2 diabetes mellitus with hyperglycemia: Secondary | ICD-10-CM

## 2024-03-12 DIAGNOSIS — E119 Type 2 diabetes mellitus without complications: Secondary | ICD-10-CM | POA: Diagnosis not present

## 2024-03-12 DIAGNOSIS — R634 Abnormal weight loss: Secondary | ICD-10-CM | POA: Insufficient documentation

## 2024-03-12 LAB — POCT GLYCOSYLATED HEMOGLOBIN (HGB A1C): Hemoglobin A1C: 5.9 % — AB (ref 4.0–5.6)

## 2024-03-12 MED ORDER — CLOPIDOGREL BISULFATE 75 MG PO TABS: 75.0000 mg | ORAL_TABLET | Freq: Every day | ORAL | 1 refills | Status: DC

## 2024-03-12 MED ORDER — NITROGLYCERIN 0.4 MG SL SUBL: 0.4000 mg | SUBLINGUAL_TABLET | SUBLINGUAL | 1 refills | Status: DC | PRN

## 2024-03-12 MED ORDER — MECLIZINE HCL 25 MG PO TABS: 25.0000 mg | ORAL_TABLET | Freq: Three times a day (TID) | ORAL | 3 refills | Status: DC | PRN

## 2024-03-12 MED ORDER — AMLODIPINE BESYLATE 10 MG PO TABS: 5.0000 mg | ORAL_TABLET | Freq: Every day | ORAL | 2 refills | Status: DC

## 2024-03-12 MED ORDER — METFORMIN HCL ER 500 MG PO TB24: 500.0000 mg | ORAL_TABLET | Freq: Every day | ORAL | 1 refills | Status: DC

## 2024-03-12 MED ORDER — ATORVASTATIN CALCIUM 80 MG PO TABS: 80.0000 mg | ORAL_TABLET | Freq: Every day | ORAL | 3 refills | Status: DC

## 2024-03-12 MED ORDER — FLUOXETINE HCL 20 MG PO CAPS: 20.0000 mg | ORAL_CAPSULE | Freq: Every day | ORAL | 1 refills | Status: DC

## 2024-03-12 MED ORDER — CARVEDILOL 12.5 MG PO TABS: 12.5000 mg | ORAL_TABLET | Freq: Two times a day (BID) | ORAL | 1 refills | Status: DC

## 2024-03-12 NOTE — Assessment & Plan Note (Signed)
 Blood pressure is low in office today, he is not currently taking any antihypertensive medications.  For now, can hold off on blood pressure medications given low blood pressure.  Can monitor closely moving forward.  Recommend intermittent monitoring of blood pressure at home

## 2024-03-12 NOTE — Assessment & Plan Note (Signed)
 Uncertain control, A1c has been elevated in the past, no recent records available for review We can proceed with labs today as below.  For now, can continue with metformin , further recommendations regarding medication pending results of labs We can also proceed with assessment of urine ACR, he indicates he will have to return to have his testing done Plan to complete foot exam at future visit

## 2024-03-12 NOTE — Assessment & Plan Note (Signed)
 Uncertain etiology.  Patient also has low blood pressure in office today.  There are number of potential causes including poorly controlled diabetes which can result in weight loss.  Additionally there could be new underlying conditions such as GI related etiology, possible malignancy.  We will initially proceed with labs as below, further testing pending results of initial labs

## 2024-03-12 NOTE — Assessment & Plan Note (Signed)
 Observed on recent CT scan.  Given findings, patient history, would recommend for patient to have further evaluation with GI, patient amenable, referral placed today

## 2024-03-12 NOTE — Progress Notes (Signed)
 New Patient Office Visit  Subjective   Patient ID: Christopher Roberts, male    DOB: [birth year]  Age: 78 y.o. MRN: [id number]  CC:  Chief Complaint  Patient presents with   Establish Care   Dizziness    When standing or laying down    HPI Christopher Roberts presents to establish care Last PCP - [facility]  Patient does follow with cardiology related to history of hyperlipidemia, hypertension and CAD status post CABG in 2009, inferior STEMI March 2022.  Current medications include amlodipine , atorvastatin , carvedilol , ASA 81, Plavix .  Also notes history of diabetes.  Currently taking metformin .  Last A1c available on chart review was from a few years ago, was elevated at 8.0% at that time.  He has been having some issues with shortness of breath when he stands up, occasionally dizzy at times. Has been having some ongoing issues with weight loss over the past few months or so - patient and family attribute this to medications as he went to his PCP and was put back on most of his medications as he had stopped taking them. He did have visit with cardiology about 2.5 months ago and weight then was 171 lbs. With cardiology in early 2024 his weight was 187.  Notes history of heavier drinking in the past - notes when younger adult he would drink a notable amount of liquor, did drink about 6 beers per day during later adult life. Stopped drinking alcohol in 2006 - now drinking some but infrequently drinks beer.  Outpatient Encounter Medications as of 03/12/2024  Medication Sig   aspirin  81 MG tablet Take 81 mg by mouth daily.   nicotine  (NICODERM CQ  - DOSED IN MG/24 HR) 7 mg/24hr patch Place 1 patch (7 mg total) onto the skin daily.   traZODone (DESYREL) 50 MG tablet Take 50 mg by mouth at bedtime as needed.   [DISCONTINUED] amLODipine  (NORVASC ) 10 MG tablet Take 0.5 tablets (5 mg total) by mouth daily.   [DISCONTINUED] atorvastatin  (LIPITOR ) 80 MG tablet Take 1 tablet (80 mg total) by mouth  daily at 6 PM.   [DISCONTINUED] carvedilol  (COREG ) 12.5 MG tablet TAKE 1 TABLET (12.5 MG TOTAL) BY MOUTH 2 (TWO) TIMES DAILY WITH A MEAL.   [DISCONTINUED] clopidogrel  (PLAVIX ) 75 MG tablet TAKE 1 TABLET BY MOUTH EVERY DAY   [DISCONTINUED] FLUoxetine (PROZAC) 20 MG capsule Take 20 mg by mouth daily.   [DISCONTINUED] meclizine  (ANTIVERT ) 25 MG tablet Take 25 mg by mouth 3 (three) times daily as needed.   [DISCONTINUED] metFORMIN  (GLUCOPHAGE -XR) 500 MG 24 hr tablet Take 500 mg by mouth daily.   [DISCONTINUED] nitroGLYCERIN  (NITROSTAT ) 0.4 MG SL tablet Place 1 tablet (0.4 mg total) under the tongue every 5 (five) minutes as needed for chest pain.   amLODipine  (NORVASC ) 10 MG tablet Take 0.5 tablets (5 mg total) by mouth daily.   atorvastatin  (LIPITOR ) 80 MG tablet Take 1 tablet (80 mg total) by mouth daily at 6 PM.   carvedilol  (COREG ) 12.5 MG tablet Take 1 tablet (12.5 mg total) by mouth 2 (two) times daily with a meal.   clopidogrel  (PLAVIX ) 75 MG tablet Take 1 tablet (75 mg total) by mouth daily.   FLUoxetine (PROZAC) 20 MG capsule Take 1 capsule (20 mg total) by mouth daily.   meclizine  (ANTIVERT ) 25 MG tablet Take 1 tablet (25 mg total) by mouth 3 (three) times daily as needed.   metFORMIN  (GLUCOPHAGE -XR) 500 MG 24 hr tablet Take 1 tablet (  500 mg total) by mouth daily.   nitroGLYCERIN  (NITROSTAT ) 0.4 MG SL tablet Place 1 tablet (0.4 mg total) under the tongue every 5 (five) minutes as needed for chest pain.   No facility-administered encounter medications on file as of 03/12/2024.    Past Medical History:  Diagnosis Date   CAD (coronary artery disease)    a. s/p MI in 2005 (symptom of indigestion); b. CABG x 3 in 3/09: L-LAD, S-OM, EF unknown   Colon polyps    Diabetes mellitus    DJD (degenerative joint disease)    HLD (hyperlipidemia)    HTN (hypertension)    MI (myocardial infarction) (HCC) 2005   manifested by indigestion   Murmur    echo 6/12:  Upper septal thickening, no LVOT  gradient,, EF 65%, mild LAE      Past Surgical History:  Procedure Laterality Date   CHOLECYSTECTOMY  2002   CORONARY ARTERY BYPASS GRAFT  2009   x3   CORONARY/GRAFT ACUTE MI REVASCULARIZATION N/A 07/25/2020   Procedure: Coronary/Graft Acute MI Revascularization;  Surgeon: Jordan, Peter M, MD;  Location: [facility] INVASIVE CV LAB;  Service: Cardiovascular;  Laterality: N/A;   LEFT HEART CATH AND CORS/GRAFTS ANGIOGRAPHY N/A 07/25/2020   Procedure: LEFT HEART CATH AND CORS/GRAFTS ANGIOGRAPHY;  Surgeon: Jordan, Peter M, MD;  Location: [facility] INVASIVE CV LAB;  Service: Cardiovascular;  Laterality: N/A;   TONSILLECTOMY      Family History  Problem Relation Age of Onset   Colon cancer Mother    Heart attack Mother    High blood pressure Father    Diabetes Father     Social History   Socioeconomic History   Marital status: Legally Separated    Spouse name: Paulette   Number of children: 2   Years of education: 12   Highest education level: Not on file  Occupational History   Occupation: Retired  Tobacco Use   Smoking status: Every Day    Current packs/day: 0.20    Average packs/day: 0.2 packs/day for 40.0 years (8.0 ttl pk-yrs)    Types: Cigarettes    Passive exposure: Current   Smokeless tobacco: Never   Tobacco comments:    quit march 2010 after 20 yrs  Vaping Use   Vaping status: Never Used  Substance and Sexual Activity   Alcohol use: No   Drug use: No   Sexual activity: Not on file  Other Topics Concern   Not on file  Social History Narrative   Lives with wife Paulette    Caffeine use: Coffee- 2 mugs per day   Left handed   Social Drivers of Health   Financial Resource Strain: Low Risk  (03/12/2024)   Overall Financial Resource Strain (CARDIA)    Difficulty of Paying Living Expenses: Not hard at all  Food Insecurity: No Food Insecurity (03/12/2024)   Hunger Vital Sign    Worried About Running Out of Food in the Last Year: Never true    Ran Out of Food in the Last Year:  Never true  Transportation Needs: No Transportation Needs (03/12/2024)   PRAPARE - Administrator, Civil Service (Medical): No    Lack of Transportation (Non-Medical): No  Physical Activity: Inactive (03/12/2024)   Exercise Vital Sign    Days of Exercise per Week: 0 days    Minutes of Exercise per Session: 0 min  Stress: No Stress Concern Present (03/12/2024)   [employer] of Occupational Health - Occupational Stress Questionnaire    Feeling  of Stress: Not at all  Social Connections: Unknown (03/12/2024)   Social Connection and Isolation Panel    Frequency of Communication with Friends and Family: More than three times a week    Frequency of Social Gatherings with Friends and Family: More than three times a week    Attends Religious Services: More than 4 times per year    Active Member of [employer] or Organizations: No    Attends Banker Meetings: Never    Marital Status: Patient unable to answer  Intimate Partner Violence: Not At Risk (03/12/2024)   Humiliation, Afraid, Rape, and Kick questionnaire    Fear of Current or Ex-Partner: No    Emotionally Abused: No    Physically Abused: No    Sexually Abused: No    Objective   BP (!) 89/54 (BP Location: Left Arm, Patient Position: Sitting, Cuff Size: Normal)   Pulse 89   Temp 97.8 F (36.6 C) (Oral)   Resp 18   Ht 5' 9 (1.753 m)   Wt 168 lb (76.2 kg)   SpO2 97%   BMI 24.81 kg/m   Physical Exam  77 year old male in no acute distress.  He does present in wheelchair Cardiovascular exam with regular rate and rhythm Lungs clear to auscultation bilaterally  Assessment & Plan:   Type 2 diabetes mellitus with hyperglycemia, without long-term current use of insulin  (HCC) Assessment & Plan: Uncertain control, A1c has been elevated in the past, no recent records available for review We can proceed with labs today as below.  For now, can continue with metformin , further recommendations regarding medication  pending results of labs We can also proceed with assessment of urine ACR, he indicates he will have to return to have his testing done Plan to complete foot exam at future visit  Orders: -     Microalbumin / creatinine urine ratio -     Hemoglobin A1c  Type 2 diabetes mellitus without complication, without long-term current use of insulin  (HCC) Assessment & Plan: Uncertain control, A1c has been elevated in the past, no recent records available for review We can proceed with labs today as below.  For now, can continue with metformin , further recommendations regarding medication pending results of labs We can also proceed with assessment of urine ACR, he indicates he will have to return to have his testing done Plan to complete foot exam at future visit  Orders: -     amLODIPine  Besylate; Take 0.5 tablets (5 mg total) by mouth daily.  Dispense: 60 tablet; Refill: 2 -     Atorvastatin  Calcium ; Take 1 tablet (80 mg total) by mouth daily at 6 PM.  Dispense: 90 tablet; Refill: 3 -     Carvedilol ; Take 1 tablet (12.5 mg total) by mouth 2 (two) times daily with a meal.  Dispense: 180 tablet; Refill: 1 -     Clopidogrel  Bisulfate; Take 1 tablet (75 mg total) by mouth daily.  Dispense: 90 tablet; Refill: 1 -     FLUoxetine HCl; Take 1 capsule (20 mg total) by mouth daily.  Dispense: 90 capsule; Refill: 1 -     Meclizine  HCl; Take 1 tablet (25 mg total) by mouth 3 (three) times daily as needed.  Dispense: 30 tablet; Refill: 3 -     metFORMIN  HCl ER; Take 1 tablet (500 mg total) by mouth daily.  Dispense: 90 tablet; Refill: 1 -     Nitroglycerin ; Place 1 tablet (0.4 mg total) under the tongue every 5 (  five) minutes as needed for chest pain.  Dispense: 25 tablet; Refill: 1 -     POCT glycosylated hemoglobin (Hb A1C)  Essential hypertension Assessment & Plan: Blood pressure is low in office today, he is not currently taking any antihypertensive medications.  For now, can hold off on blood pressure  medications given low blood pressure.  Can monitor closely moving forward.  Recommend intermittent monitoring of blood pressure at home  Orders: -     CBC with Differential/Platelet -     Comprehensive metabolic panel with GFR  Pure hypercholesterolemia  Cirrhosis of liver without ascites, unspecified hepatic cirrhosis type [facility]) Assessment & Plan: Observed on recent CT scan.  Given findings, patient history, would recommend for patient to have further evaluation with GI, patient amenable, referral placed today  Orders: -     Ambulatory referral to Gastroenterology  Weight loss Assessment & Plan: Uncertain etiology.  Patient also has low blood pressure in office today.  There are number of potential causes including poorly controlled diabetes which can result in weight loss.  Additionally there could be new underlying conditions such as GI related etiology, possible malignancy.  We will initially proceed with labs as below, further testing pending results of initial labs  Orders: -     CBC with Differential/Platelet -     Comprehensive metabolic panel with GFR  Return in about 6 weeks (around 04/23/2024) for 40 minutes.   Spent 63 minutes on this patient encounter, including preparation, chart review, face-to-face counseling with patient and coordination of care, and documentation of encounter   ___________________________________________ Maecie Sevcik [name], MD, ABFM, [facility]

## 2024-03-14 ENCOUNTER — Ambulatory Visit (HOSPITAL_BASED_OUTPATIENT_CLINIC_OR_DEPARTMENT_OTHER): Payer: Self-pay | Admitting: Family Medicine

## 2024-03-14 DIAGNOSIS — D61818 Other pancytopenia: Secondary | ICD-10-CM

## 2024-03-14 LAB — COMPREHENSIVE METABOLIC PANEL WITH GFR
ALT: 11 IU/L (ref 0–44)
AST: 16 IU/L (ref 0–40)
Albumin: 3.6 g/dL — ABNORMAL LOW (ref 3.8–4.8)
Alkaline Phosphatase: 101 IU/L (ref 47–123)
BUN/Creatinine Ratio: 13 (ref 10–24)
BUN: 19 mg/dL (ref 8–27)
Bilirubin Total: 1.2 mg/dL (ref 0.0–1.2)
CO2: 19 mmol/L — ABNORMAL LOW (ref 20–29)
Calcium: 8.7 mg/dL (ref 8.6–10.2)
Chloride: 107 mmol/L — ABNORMAL HIGH (ref 96–106)
Creatinine, Ser: 1.48 mg/dL — ABNORMAL HIGH (ref 0.76–1.27)
Globulin, Total: 3.3 g/dL (ref 1.5–4.5)
Glucose: 161 mg/dL — ABNORMAL HIGH (ref 70–99)
Potassium: 4.2 mmol/L (ref 3.5–5.2)
Sodium: 141 mmol/L (ref 134–144)
Total Protein: 6.9 g/dL (ref 6.0–8.5)
eGFR: 48 mL/min/1.73 — ABNORMAL LOW (ref >59)

## 2024-03-14 LAB — CBC WITH DIFFERENTIAL/PLATELET
Basophils Absolute: 0 x10E3/uL (ref 0.0–0.2)
Basos: 0 %
EOS (ABSOLUTE): 0 x10E3/uL (ref 0.0–0.4)
Eos: 1 %
Hematocrit: 32.5 % — ABNORMAL LOW (ref 37.5–51.0)
Hemoglobin: 10.8 g/dL — ABNORMAL LOW (ref 13.0–17.7)
Immature Grans (Abs): 0 x10E3/uL (ref 0.0–0.1)
Immature Granulocytes: 0 %
Lymphocytes Absolute: 1.1 x10E3/uL (ref 0.7–3.1)
Lymphs: 40 %
MCH: 29.8 pg (ref 26.6–33.0)
MCHC: 33.2 g/dL (ref 31.5–35.7)
MCV: 90 fL (ref 79–97)
Monocytes Absolute: 0.1 x10E3/uL (ref 0.1–0.9)
Monocytes: 4 %
Neutrophils Absolute: 1.6 x10E3/uL (ref 1.4–7.0)
Neutrophils: 55 %
Platelets: 63 x10E3/uL — CL (ref 150–450)
RBC: 3.62 x10E6/uL — ABNORMAL LOW (ref 4.14–5.80)
RDW: 18.5 % — ABNORMAL HIGH (ref 11.6–15.4)
WBC: 2.9 x10E3/uL — ABNORMAL LOW (ref 3.4–10.8)

## 2024-03-14 LAB — HEMOGLOBIN A1C
Est. average glucose Bld gHb Est-mCnc: 123 mg/dL
Hgb A1c MFr Bld: 5.9 % — ABNORMAL HIGH (ref 4.8–5.6)

## 2024-03-17 ENCOUNTER — Inpatient Hospital Stay: Attending: Oncology | Admitting: Oncology

## 2024-03-17 ENCOUNTER — Encounter: Payer: Self-pay | Admitting: Oncology

## 2024-03-17 ENCOUNTER — Inpatient Hospital Stay

## 2024-03-17 VITALS — BP 140/80 | HR 74 | Temp 97.6°F | Ht 69.0 in | Wt 172.4 lb

## 2024-03-17 DIAGNOSIS — Z79899 Other long term (current) drug therapy: Secondary | ICD-10-CM | POA: Insufficient documentation

## 2024-03-17 DIAGNOSIS — K7460 Unspecified cirrhosis of liver: Secondary | ICD-10-CM | POA: Diagnosis not present

## 2024-03-17 DIAGNOSIS — Z7984 Long term (current) use of oral hypoglycemic drugs: Secondary | ICD-10-CM | POA: Insufficient documentation

## 2024-03-17 DIAGNOSIS — F3289 Other specified depressive episodes: Secondary | ICD-10-CM | POA: Diagnosis not present

## 2024-03-17 DIAGNOSIS — D61818 Other pancytopenia: Secondary | ICD-10-CM | POA: Insufficient documentation

## 2024-03-17 DIAGNOSIS — E119 Type 2 diabetes mellitus without complications: Secondary | ICD-10-CM | POA: Insufficient documentation

## 2024-03-17 DIAGNOSIS — I10 Essential (primary) hypertension: Secondary | ICD-10-CM | POA: Diagnosis not present

## 2024-03-17 DIAGNOSIS — Z87891 Personal history of nicotine dependence: Secondary | ICD-10-CM | POA: Diagnosis not present

## 2024-03-17 LAB — IRON AND TIBC
Iron: 163 ug/dL (ref 45–182)
Saturation Ratios: 58 % — ABNORMAL HIGH (ref 17.9–39.5)
TIBC: 281 ug/dL (ref 250–450)
UIBC: 118 ug/dL

## 2024-03-17 LAB — CMP (CANCER CENTER ONLY)
ALT: 15 U/L (ref 0–44)
AST: 23 U/L (ref 15–41)
Albumin: 4.3 g/dL (ref 3.5–5.0)
Alkaline Phosphatase: 113 U/L (ref 38–126)
Anion gap: 12 (ref 5–15)
BUN: 14 mg/dL (ref 8–23)
CO2: 22 mmol/L (ref 22–32)
Calcium: 9.7 mg/dL (ref 8.9–10.3)
Chloride: 107 mmol/L (ref 98–111)
Creatinine: 1.07 mg/dL (ref 0.61–1.24)
GFR, Estimated: >60 mL/min (ref >60)
Glucose, Bld: 127 mg/dL — ABNORMAL HIGH (ref 70–99)
Potassium: 3.8 mmol/L (ref 3.5–5.1)
Sodium: 141 mmol/L (ref 135–145)
Total Bilirubin: 1.4 mg/dL — ABNORMAL HIGH (ref 0.0–1.2)
Total Protein: 8.3 g/dL — ABNORMAL HIGH (ref 6.5–8.1)

## 2024-03-17 LAB — CBC WITH DIFFERENTIAL (CANCER CENTER ONLY)
Abs Immature Granulocytes: 0.01 K/uL (ref 0.00–0.07)
Basophils Absolute: 0 K/uL (ref 0.0–0.1)
Basophils Relative: 0 %
Eosinophils Absolute: 0 K/uL (ref 0.0–0.5)
Eosinophils Relative: 1 %
HCT: 33.6 % — ABNORMAL LOW (ref 39.0–52.0)
Hemoglobin: 11.2 g/dL — ABNORMAL LOW (ref 13.0–17.0)
Immature Granulocytes: 0 %
Lymphocytes Relative: 45 %
Lymphs Abs: 1.3 K/uL (ref 0.7–4.0)
MCH: 30.2 pg (ref 26.0–34.0)
MCHC: 33.3 g/dL (ref 30.0–36.0)
MCV: 90.6 fL (ref 80.0–100.0)
Monocytes Absolute: 0.1 K/uL (ref 0.1–1.0)
Monocytes Relative: 4 %
Neutro Abs: 1.4 K/uL — ABNORMAL LOW (ref 1.7–7.7)
Neutrophils Relative %: 50 %
Platelet Count: 58 K/uL — ABNORMAL LOW (ref 150–400)
RBC: 3.71 MIL/uL — ABNORMAL LOW (ref 4.22–5.81)
RDW: 19.3 % — ABNORMAL HIGH (ref 11.5–15.5)
WBC Count: 2.9 K/uL — ABNORMAL LOW (ref 4.0–10.5)
nRBC: 0 % (ref 0.0–0.2)

## 2024-03-17 LAB — VITAMIN B12: Vitamin B-12: 324 pg/mL (ref 180–914)

## 2024-03-17 LAB — LACTATE DEHYDROGENASE: LDH: 229 U/L — ABNORMAL HIGH (ref 98–192)

## 2024-03-17 LAB — FOLATE: Folate: 10.2 ng/mL (ref >5.9)

## 2024-03-17 LAB — TSH: TSH: 2.27 u[IU]/mL (ref 0.350–4.500)

## 2024-03-17 LAB — FERRITIN: Ferritin: 586 ng/mL — ABNORMAL HIGH (ref 24–336)

## 2024-03-17 NOTE — Assessment & Plan Note (Addendum)
 Patient recently established with new primary care provider Dr. [name].  Routine labs on 03/12/2024 showed white count of 2900 with normal differential, ANC 1600, hemoglobin 10.8, hematocrit 32.5, MCV 90, platelet count 63,000.  He was referred to us  for further evaluation of pancytopenia.  On review of records, labs in March 2022 showed hemoglobin of 12, platelet count of 129,000.  White count was normal at 7500 at that time.  No labs available in the interim for review.  On 12/13/2023, CT abdomen and pelvis showed shrunken hepatic morphology with enlargement of the caudate lobe of the liver and subtle contour nodularity suggestive of cirrhosis. Wall thickening of the ascending colon through the splenic flexure with pericolonic stranding, consistent with colitis. Possibly reflecting portal colopathy in the setting of possible cirrhosis although and infectious/inflammatory etiology would be the most pertinent differential consideration. Left-sided colonic diverticulosis.   Differential diagnosis includes vitamin deficiencies, thyroid dysfunction, liver cirrhosis, and myelodysplastic syndrome (MDS).  Considering his age, MDS is a possibility.  Bone marrow biopsy considered if initial tests are inconclusive. Discussed potential need for bone marrow biopsy under CT guidance by interventional radiology, which is less painful due to precise targeting.  Labs today reveal white count of 2900 with ANC of 1400, normal differential.  No immature cells noted in the periphery.  Hemoglobin 11.2, MCV 90.6, platelet count 58,000.  TSH normal.  LDH slightly high at 229.  We did check iron studies, B12, folic acid, methylmalonic acid, flow cytometry of peripheral blood, ANA to rule out other possible etiologies for pancytopenia.  - Will consider bone marrow biopsy if initial tests are inconclusive.  I will discuss results of remaining workup over the phone in the next 2 weeks and determine further course of action.  For  now I will plan to see him back in 4 to 5 weeks for follow-up with repeat labs.

## 2024-03-17 NOTE — Assessment & Plan Note (Signed)
 Cirrhosis of the liver with signs observed on a scan in August. Possible contribution to pancytopenia. Referral to gastroenterology for further assessment and potential endoscopy to evaluate for varices. - Continue follow up with gastroenterology for further assessment and potential endoscopy.

## 2024-03-17 NOTE — Progress Notes (Signed)
 [facility]  HEMATOLOGY CLINIC CONSULTATION NOTE   PATIENT NAME: Christopher Roberts   MR#: [id number] DOB: December 03, 1945  DATE OF SERVICE: 03/17/2024   REFERRING PROVIDER  [name], [name], MD   Patient Care Team: [name], Quintin PARAS, MD as PCP - General (Family Medicine)   REASON FOR CONSULTATION/ CHIEF COMPLAINT:  Pancytopenia for further evaluation  ASSESSMENT & PLAN:  Christopher Roberts is a 78 y.o. gentleman with a past medical history of CAD status post CABG in 2009, hypertension, type 2 diabetes mellitus, dyslipidemia, inferior STEMI in March 2022, was referred to our service for evaluation of pancytopenia.    Other pancytopenia [facility]) Patient recently established with new primary care provider Dr. [name].  Routine labs on 03/12/2024 showed white count of 2900 with normal differential, ANC 1600, hemoglobin 10.8, hematocrit 32.5, MCV 90, platelet count 63,000.  He was referred to us  for further evaluation of pancytopenia.  On review of records, labs in March 2022 showed hemoglobin of 12, platelet count of 129,000.  White count was normal at 7500 at that time.  No labs available in the interim for review.  On 12/13/2023, CT abdomen and pelvis showed shrunken hepatic morphology with enlargement of the caudate lobe of the liver and subtle contour nodularity suggestive of cirrhosis. Wall thickening of the ascending colon through the splenic flexure with pericolonic stranding, consistent with colitis. Possibly reflecting portal colopathy in the setting of possible cirrhosis although and infectious/inflammatory etiology would be the most pertinent differential consideration. Left-sided colonic diverticulosis.   Differential diagnosis includes vitamin deficiencies, thyroid dysfunction, liver cirrhosis, and myelodysplastic syndrome (MDS).  Considering his age, MDS is a possibility.  Bone marrow biopsy considered if initial tests are inconclusive. Discussed potential need for bone  marrow biopsy under CT guidance by interventional radiology, which is less painful due to precise targeting.  Labs today reveal white count of 2900 with ANC of 1400, normal differential.  No immature cells noted in the periphery.  Hemoglobin 11.2, MCV 90.6, platelet count 58,000.  TSH normal.  LDH slightly high at 229.  We did check iron studies, B12, folic acid, methylmalonic acid, flow cytometry of peripheral blood, ANA to rule out other possible etiologies for pancytopenia.  - Will consider bone marrow biopsy if initial tests are inconclusive.  I will discuss results of remaining workup over the phone in the next 2 weeks and determine further course of action.  For now I will plan to see him back in 4 to 5 weeks for follow-up with repeat labs.  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 Possible contribution to pancytopenia. Referral to gastroenterology for further assessment and potential endoscopy to evaluate for varices. - Continue follow up with gastroenterology for further assessment and potential endoscopy.  Type 2 diabetes mellitus, well controlled Type 2 diabetes mellitus is well controlled with an A1c of 5.9. Previously experienced diarrhea with higher doses of metformin , now reduced to one tablet daily. - Continue metformin  at one tablet daily.  Hypertension Management complicated by recent medication changes. Blood pressure remains slightly elevated. Previously on carvedilol  and amlodipine . - Continue to monitor blood pressure and adjust medications as needed.  I reviewed lab results and outside records for this visit and discussed relevant results with the patient. Diagnosis, plan of care and treatment options were also discussed in detail with the patient. Opportunity provided to ask questions and answers provided to his apparent satisfaction.  Provided instructions to call our clinic with any problems, questions or concerns prior to  return visit. I recommended to continue follow-up with PCP and sub-specialists. He verbalized understanding and agreed with the plan. No barriers to learning was detected.  Chinita Patten, MD  03/17/2024 2:29 PM  [facility] - A DEPT OF [facility] [address] [place] [address] Dept: [phone] Dept Fax: [phone]   HISTORY OF PRESENT ILLNESS:  Discussed the use of AI scribe software for clinical note transcription with the patient, who gave verbal consent to proceed.  History of Present Illness Christopher Roberts is a 78 year old male with cirrhosis who presents with low blood counts. He was referred by Dr. [name] for evaluation of low blood counts.  Patient recently established with new primary care provider Dr. [name].  Routine labs on 03/12/2024 showed white count of 2900 with normal differential, ANC 1600, hemoglobin 10.8, hematocrit 32.5, MCV 90, platelet count 63,000.  He was referred to us  for further evaluation of pancytopenia.  On review of records, labs in March 2022 showed hemoglobin of 12, platelet count of 129,000.  White count was normal at 7500 at that time.  No labs available in the interim for review.  On 12/13/2023, CT abdomen and pelvis showed shrunken hepatic morphology with enlargement of the caudate lobe of the liver and subtle contour nodularity suggestive of cirrhosis. Wall thickening of the ascending colon through the splenic flexure with pericolonic stranding, consistent with colitis. Possibly reflecting portal colopathy in the setting of possible cirrhosis although and infectious/inflammatory etiology would be the most pertinent differential consideration. Left-sided colonic diverticulosis.   No new medications have been started. He denies blood in stools, black stools, nausea, vomiting, abdominal pain, fevers, chills, and night sweats. He experiences dizziness when standing up too  quickly, which he manages by sitting up slowly and using support. He acknowledges not drinking enough water, consuming about three bottles a day.  He has a history of cirrhosis, with a scan in August showing signs of liver cirrhosis and changes in the colon. He stopped drinking heavily a couple of years ago after a period of heavy alcohol consumption that began in the 2000s. He was previously diagnosed with cirrhosis as a result of his alcohol use.  He reports unintentional weight loss, losing about 15 pounds in a short period around August after stopping his medications. He resumed his medications, which coincided with the onset of his current symptoms.  His current medications include metformin , which he takes once a day after previously taking it twice a day, which caused diarrhea. His diabetes is under good control with an A1c of 5.9. He stopped certain medications, including carvedilol , due to kidney function concerns.   MEDICAL HISTORY Past Medical History:  Diagnosis Date   CAD (coronary artery disease)    a. s/p MI in 2005 (symptom of indigestion); b. CABG x 3 in 3/09: L-LAD, S-OM, EF unknown   Colon polyps    Diabetes mellitus    DJD (degenerative joint disease)    HLD (hyperlipidemia)    HTN (hypertension)    MI (myocardial infarction) (HCC) 2005   manifested by indigestion   Murmur    echo 6/12:  Upper septal thickening, no LVOT gradient,, EF 65%, mild LAE       SURGICAL HISTORY Past Surgical History:  Procedure Laterality Date   CHOLECYSTECTOMY  2002   CORONARY ARTERY BYPASS GRAFT  2009   x3   CORONARY/GRAFT ACUTE MI REVASCULARIZATION N/A 07/25/2020   Procedure: Coronary/Graft Acute MI Revascularization;  Surgeon: Jordan, Peter M, MD;  Location: [facility] INVASIVE CV LAB;  Service: Cardiovascular;  Laterality: N/A;   LEFT HEART CATH AND CORS/GRAFTS ANGIOGRAPHY N/A 07/25/2020   Procedure: LEFT HEART CATH AND CORS/GRAFTS ANGIOGRAPHY;  Surgeon: Jordan, Peter M, MD;  [facility];  Service: Cardiovascular;  Laterality: N/A;   TONSILLECTOMY       SOCIAL HISTORY: He reports that he has quit smoking. His smoking use included cigarettes. He has a 8 pack-year smoking history. He has been exposed to tobacco smoke. He has never used smokeless tobacco. He reports that he does not drink alcohol and does not use drugs. Social History   Socioeconomic History   Marital status: Legally Separated    Spouse name: Paulette   Number of children: 2   Years of education: 12   Highest education level: Not on file  Occupational History   Occupation: Retired  Tobacco Use   Smoking status: Former    Current packs/day: 0.20    Average packs/day: 0.2 packs/day for 40.0 years (8.0 ttl pk-yrs)    Types: Cigarettes    Passive exposure: Current   Smokeless tobacco: Never   Tobacco comments:    quit march 2010 after 20 yrs  Vaping Use   Vaping status: Never Used  Substance and Sexual Activity   Alcohol use: No   Drug use: No   Sexual activity: Not on file  Other Topics Concern   Not on file  Social History Narrative   Lives with wife Paulette    Caffeine use: Coffee- 2 mugs per day   Left handed   Social Drivers of Health   Financial Resource Strain: Low Risk  (03/12/2024)   Overall Financial Resource Strain (CARDIA)    Difficulty of Paying Living Expenses: Not hard at all  Food Insecurity: Food Insecurity Present (03/17/2024)   Hunger Vital Sign    Worried About Running Out of Food in the Last Year: Sometimes true    Ran Out of Food in the Last Year: Never true  Transportation Needs: No Transportation Needs (03/12/2024)   PRAPARE - Administrator, Civil Service (Medical): No    Lack of Transportation (Non-Medical): No  Physical Activity: Inactive (03/12/2024)   Exercise Vital Sign    Days of Exercise per Week: 0 days    Minutes of Exercise per Session: 0 min  Stress: No Stress Concern Present (03/12/2024)   [employer] of Occupational  Health - Occupational Stress Questionnaire    Feeling of Stress: Not at all  Social Connections: Unknown (03/12/2024)   Social Connection and Isolation Panel    Frequency of Communication with Friends and Family: More than three times a week    Frequency of Social Gatherings with Friends and Family: More than three times a week    Attends Religious Services: More than 4 times per year    Active Member of [employer] or Organizations: No    Attends Banker Meetings: Never    Marital Status: Patient unable to answer  Intimate Partner Violence: Not At Risk (03/12/2024)   Humiliation, Afraid, Rape, and Kick questionnaire    Fear of Current or Ex-Partner: No    Emotionally Abused: No    Physically Abused: No    Sexually Abused: No    FAMILY HISTORY: His family history includes Colon cancer in his mother; Diabetes in  his father; Heart attack in his mother; High blood pressure in his father.  CURRENT MEDICATIONS   Current Outpatient Medications  Medication Instructions   amLODipine  (NORVASC ) 5 mg, Oral, Daily   aspirin  81 mg, Daily   atorvastatin  (LIPITOR ) 80 mg, Oral, Daily-1800   carvedilol  (COREG ) 12.5 mg, Oral, 2 times daily with meals   clopidogrel  (PLAVIX ) 75 mg, Oral, Daily   FLUoxetine (PROZAC) 20 mg, Oral, Daily   meclizine  (ANTIVERT ) 25 mg, Oral, 3 times daily PRN   metFORMIN  (GLUCOPHAGE -XR) 500 mg, Oral, Daily   nicotine  (NICODERM CQ  - DOSED IN MG/24 HR) 7 mg, Transdermal, Daily   nitroGLYCERIN  (NITROSTAT ) 0.4 mg, Sublingual, Every 5 min PRN   traZODone (DESYREL) 50 mg, At bedtime PRN     ALLERGIES  He has no known allergies.  REVIEW OF SYSTEMS:  Review of Systems - Oncology   Rest of the pertinent review of systems is unremarkable except as mentioned above in HPI.  PHYSICAL EXAMINATION:    Onc Performance Status - 03/17/24 1051       ECOG Perf Status   ECOG Perf Status Restricted in physically strenuous activity but ambulatory and able to carry out work  of a light or sedentary nature, e.g., light house work, office work      KPS SCALE   KPS % SCORE Normal activity with effort, some s/s of disease          Vitals:   03/17/24 1032 03/17/24 1038  BP: (!) 179/93 (!) 140/80  Pulse: 74   Temp: 97.6 F (36.4 C)   SpO2: 100%    Filed Weights   03/17/24 1032  Weight: 172 lb 6.4 oz (78.2 kg)    Physical Exam Constitutional:      General: He is not in acute distress.    Appearance: Normal appearance.  HENT:     Head: Normocephalic and atraumatic.  Eyes:     Conjunctiva/sclera: Conjunctivae normal.  Cardiovascular:     Rate and Rhythm: Normal rate and regular rhythm.  Pulmonary:     Effort: Pulmonary effort is normal. No respiratory distress.  Abdominal:     General: There is no distension.  Neurological:     General: No focal deficit present.     Mental Status: He is alert and oriented to person, place, and time.  Psychiatric:        Mood and Affect: Mood normal.        Behavior: Behavior normal.      LABORATORY DATA:   I have reviewed the data as listed.  Results for orders placed or performed in visit on 03/17/24  TSH  Result Value Ref Range   TSH 2.270 0.350 - 4.500 uIU/mL  Ferritin  Result Value Ref Range   Ferritin 586 (H) 24 - 336 ng/mL  Lactate dehydrogenase  Result Value Ref Range   LDH 229 (H) 98 - 192 U/L  CMP (Cancer Center only)  Result Value Ref Range   Sodium 141 135 - 145 mmol/L   Potassium 3.8 3.5 - 5.1 mmol/L   Chloride 107 98 - 111 mmol/L   CO2 22 22 - 32 mmol/L   Glucose, Bld 127 (H) 70 - 99 mg/dL   BUN 14 8 - 23 mg/dL   Creatinine 8.92 9.38 - 1.24 mg/dL   Calcium  9.7 8.9 - 10.3 mg/dL   Total Protein 8.3 (H) 6.5 - 8.1 g/dL   Albumin 4.3 3.5 - 5.0 g/dL   AST 23 15 - 41 U/L  ALT 15 0 - 44 U/L   Alkaline Phosphatase 113 38 - 126 U/L   Total Bilirubin 1.4 (H) 0.0 - 1.2 mg/dL   GFR, Estimated >39 >39 mL/min   Anion gap 12 5 - 15  CBC with Differential (Cancer Center Only)  Result  Value Ref Range   WBC Count 2.9 (L) 4.0 - 10.5 K/uL   RBC 3.71 (L) 4.22 - 5.81 MIL/uL   Hemoglobin 11.2 (L) 13.0 - 17.0 g/dL   HCT 66.3 (L) 60.9 - 47.9 %   MCV 90.6 80.0 - 100.0 fL   MCH 30.2 26.0 - 34.0 pg   MCHC 33.3 30.0 - 36.0 g/dL   RDW 80.6 (H) 88.4 - 84.4 %   Platelet Count 58 (L) 150 - 400 K/uL   nRBC 0.0 0.0 - 0.2 %   Neutrophils Relative % 50 %   Neutro Abs 1.4 (L) 1.7 - 7.7 K/uL   Lymphocytes Relative 45 %   Lymphs Abs 1.3 0.7 - 4.0 K/uL   Monocytes Relative 4 %   Monocytes Absolute 0.1 0.1 - 1.0 K/uL   Eosinophils Relative 1 %   Eosinophils Absolute 0.0 0.0 - 0.5 K/uL   Basophils Relative 0 %   Basophils Absolute 0.0 0.0 - 0.1 K/uL   Immature Granulocytes 0 %   Abs Immature Granulocytes 0.01 0.00 - 0.07 K/uL     RADIOGRAPHIC STUDIES:  I have personally reviewed the radiological images as listed and agreed with the findings in the report.  CT ABDOMEN PELVIS WO CONTRAST CLINICAL DATA:  Weight loss, diarrhea over last several months  EXAM: CT ABDOMEN AND PELVIS WITHOUT CONTRAST  TECHNIQUE: Multidetector CT imaging of the abdomen and pelvis was performed following the standard protocol without IV contrast.  RADIATION DOSE REDUCTION: This exam was performed according to the departmental dose-optimization program which includes automated exposure control, adjustment of the mA and/or kV according to patient size and/or use of iterative reconstruction technique.  COMPARISON:  CT August 31, 2016.  FINDINGS: Lower chest: Motion degraded examination of the lung bases. Bibasilar subpleural reticulations.  Hepatobiliary: Shrunken hepatic morphology with enlargement of the caudate lobe of the liver and subtle contour nodularity. Gallbladder surgically absent. No biliary ductal dilation.  Pancreas: No pancreatic ductal dilation or evidence of acute inflammation.  Spleen: No splenomegaly  Adrenals/Urinary Tract: No suspicious adrenal nodule/mass. Fluid density  right renal lesions are compatible with cysts. Punctate nonobstructive left lower pole renal stone. Urinary bladder is minimally distended.  Stomach/Bowel: Radiopaque enteric contrast material traverses the descending colon. Stomach is minimally distended limiting evaluation. No pathologic dilation of small or large bowel. Wall thickening of the ascending colon through the splenic flexure with pericolonic stranding. Left-sided colonic diverticulosis. Wall thickening versus underdistention of the sigmoid colon. Nondilated appendix.  Vascular/Lymphatic: Aortic atherosclerosis. No pathologically enlarged abdominal or pelvic lymph nodes.  Reproductive: Prostate is unremarkable.  Other: Small volume pelvic free fluid with fluid in the pericolic gutters and mesenteric stranding.  Musculoskeletal: Multilevel degenerative changes spine. Degenerative change of the bilateral hips and SI joints. Partial bony ankylosis of the right SI joint. Chronic osseous changes of the pubic symphysis.  IMPRESSION: 1. Shrunken hepatic morphology with enlargement of the caudate lobe of the liver and subtle contour nodularity suggestive of cirrhosis. 2. Wall thickening of the ascending colon through the splenic flexure with pericolonic stranding, consistent with colitis. Possibly reflecting portal colopathy in the setting of possible cirrhosis although and infectious/inflammatory etiology would be the most pertinent differential consideration. 3.  Left-sided colonic diverticulosis. Wall thickening versus underdistention of the sigmoid colon. 4. Small volume pelvic free fluid with fluid in the pericolic gutters and mesenteric stranding, may be reactive or reflect sequela of portal hypertension. 5. Punctate nonobstructive left lower pole renal stone. 6.  Aortic Atherosclerosis (ICD10-I70.0).  Electronically Signed   By: Reyes Holder M.D.   On: 12/25/2023 15:46    Orders Placed This Encounter   Procedures   CBC with Differential (Cancer Center Only)    Standing Status:   Future    Number of Occurrences:   1    Expiration Date:   03/17/2025   CMP (Cancer Center only)    Standing Status:   Future    Number of Occurrences:   1    Expiration Date:   03/17/2025   Lactate dehydrogenase    Standing Status:   Future    Number of Occurrences:   1    Expiration Date:   03/17/2025   Iron and TIBC    Standing Status:   Future    Number of Occurrences:   1    Expiration Date:   03/17/2025   Ferritin    Standing Status:   Future    Number of Occurrences:   1    Expiration Date:   03/17/2025   Vitamin B12    Standing Status:   Future    Number of Occurrences:   1    Expiration Date:   03/17/2025   Folate    Standing Status:   Future    Number of Occurrences:   1    Expiration Date:   03/17/2025   Haptoglobin    Standing Status:   Future    Number of Occurrences:   1    Expiration Date:   03/17/2025   Methylmalonic acid, serum    Standing Status:   Future    Number of Occurrences:   1    Expiration Date:   03/17/2025   TSH    Standing Status:   Future    Number of Occurrences:   1    Expiration Date:   03/17/2025   ANA w/Reflex if Positive    Standing Status:   Future    Number of Occurrences:   1    Expiration Date:   03/17/2025   Flow Cytometry, Peripheral Blood (Oncology)    Patient presenting with pancytopenia.  Please evaluate for lymphoma    Standing Status:   Future    Number of Occurrences:   1    Expiration Date:   03/17/2025   Multiple Myeloma Panel (SPEP&IFE w/QIG)    Standing Status:   Future    Number of Occurrences:   1    Expiration Date:   03/17/2025   Kappa/lambda light chains    Standing Status:   Future    Number of Occurrences:   1    Expiration Date:   03/17/2025   Ambulatory referral to Social Work    Referral Priority:   Routine    Referral Type:   Consultation    Referral Reason:   Specialty Services Required    Number of Visits  Requested:   1    Future Appointments  Date Time Provider Department Center  04/07/2024  9:00 AM Jordan, Peter M, MD [facility]    I spent a total of 65 minutes during this encounter with the patient including review of chart and various tests results, discussions about plan of care and  coordination of care plan.  This document was completed utilizing speech recognition software. Grammatical errors, random word insertions, pronoun errors, and incomplete sentences are an occasional consequence of this system due to software limitations, ambient noise, and hardware issues. Any formal questions or concerns about the content, text or information contained within the body of this dictation should be directly addressed to the provider for clarification.

## 2024-03-18 ENCOUNTER — Inpatient Hospital Stay

## 2024-03-18 ENCOUNTER — Telehealth: Payer: Self-pay | Admitting: Oncology

## 2024-03-18 LAB — KAPPA/LAMBDA LIGHT CHAINS
Kappa free light chain: 66 mg/L — ABNORMAL HIGH (ref 3.3–19.4)
Kappa, lambda light chain ratio: 1.14 (ref 0.26–1.65)
Lambda free light chains: 58 mg/L — ABNORMAL HIGH (ref 5.7–26.3)

## 2024-03-18 LAB — ANA W/REFLEX IF POSITIVE: Anti Nuclear Antibody (ANA): NEGATIVE

## 2024-03-18 LAB — HAPTOGLOBIN: Haptoglobin: 11 mg/dL — ABNORMAL LOW (ref 34–355)

## 2024-03-18 LAB — MICROALBUMIN / CREATININE URINE RATIO
Creatinine, Urine: 118 mg/dL
Microalb/Creat Ratio: 31 mg/g{creat} — ABNORMAL HIGH (ref 0–29)
Microalbumin, Urine: 36.7 ug/mL

## 2024-03-18 LAB — SURGICAL PATHOLOGY

## 2024-03-18 NOTE — Progress Notes (Signed)
 [facility] Clinical Social Work  Clinical Social Work was referred by engineer, civil (consulting) for [facility] [facility] needs.  Clinical Social Worker attempted to reach patient to offer support and assess for needs. Patient's spouse spoke on behalf of family.    SDOH Interventions    Flowsheet Row Clinical Support from 03/18/2024 in [facility] - A Dept Of [place] Office Visit from 03/17/2024 in [facility] - A Dept Of [facility] Patient Outreach Telephone from 07/29/2020 in [facility] Community Care Coordination  SDOH Interventions     Food Insecurity Interventions Community Resources Provided, [place] Resources Referral -- --  Utilities Interventions [place] Resource Referral -- --  Depression Interventions/Treatment  -- [facility] Provided Referral to Psychiatry, Counseling     CSW sent email through [place] to patient at [email or web address] with resources for food insecurity and utilities.   Follow Up Plan:  No follow up scheduled.  Lizbeth Sprague, LCSW  Clinical Social Worker [facility]

## 2024-03-18 NOTE — Telephone Encounter (Signed)
 Patient has been scheduled for follow-up visit per 03/17/24 LOS.  Pt noted appt details on personal planner/calendar.

## 2024-03-21 LAB — FLOW CYTOMETRY

## 2024-03-21 LAB — METHYLMALONIC ACID, SERUM: Methylmalonic Acid, Quantitative: 259 nmol/L (ref 0–378)

## 2024-03-22 LAB — MULTIPLE MYELOMA PANEL, SERUM
Albumin SerPl Elph-Mcnc: 3.6 g/dL (ref 2.9–4.4)
Albumin/Glob SerPl: 0.9 (ref 0.7–1.7)
Alpha 1: 0.2 g/dL (ref 0.0–0.4)
Alpha2 Glob SerPl Elph-Mcnc: 0.7 g/dL (ref 0.4–1.0)
B-Globulin SerPl Elph-Mcnc: 1.1 g/dL (ref 0.7–1.3)
Gamma Glob SerPl Elph-Mcnc: 2.1 g/dL — ABNORMAL HIGH (ref 0.4–1.8)
Globulin, Total: 4.1 g/dL — ABNORMAL HIGH (ref 2.2–3.9)
IgA: 561 mg/dL — ABNORMAL HIGH (ref 61–437)
IgG (Immunoglobin G), Serum: 2020 mg/dL — ABNORMAL HIGH (ref 603–1613)
IgM (Immunoglobulin M), Srm: 98 mg/dL (ref 15–143)
Total Protein ELP: 7.7 g/dL (ref 6.0–8.5)

## 2024-03-27 ENCOUNTER — Encounter: Payer: Self-pay | Admitting: Internal Medicine

## 2024-04-01 ENCOUNTER — Encounter: Payer: Self-pay | Admitting: Oncology

## 2024-04-01 ENCOUNTER — Inpatient Hospital Stay (HOSPITAL_BASED_OUTPATIENT_CLINIC_OR_DEPARTMENT_OTHER): Admitting: Oncology

## 2024-04-01 DIAGNOSIS — D61818 Other pancytopenia: Secondary | ICD-10-CM | POA: Diagnosis not present

## 2024-04-01 NOTE — Progress Notes (Signed)
 [facility]  HEMATOLOGY-ONCOLOGY ELECTRONIC VISIT PROGRESS NOTE  PATIENT NAME: Christopher Roberts   MR#: [id number] DOB: 08-Jan-1946  DATE OF SERVICE: 04/01/2024  Patient Care Team: [name], Quintin PARAS, MD as PCP - General (Family Medicine)  I connected with the patient via telephone conference and verified that I am speaking with the correct person using two identifiers. The patient's location is at home and I am providing care from the [facility].  I discussed the limitations, risks, security and privacy concerns of performing an evaluation and management service by e-visits and the availability of in person appointments. I also discussed with the patient that there may be a patient responsible charge related to this service. The patient expressed understanding and agreed to proceed.   ASSESSMENT & PLAN:   Christopher Roberts is a 78 y.o. 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    Other pancytopenia [facility]) Patient recently established with new primary care provider Dr. [name].  Routine labs on 03/12/2024 showed white count of 2900 with normal differential, ANC 1600, hemoglobin 10.8, hematocrit 32.5, MCV 90, platelet count 63,000.  He was referred to us  for further evaluation of pancytopenia.  On review of records, labs in March 2022 showed hemoglobin of 12, platelet count of 129,000.  White count was normal at 7500 at that time.  No labs available in the interim for review.  On 12/13/2023, CT abdomen and pelvis showed shrunken hepatic morphology with enlargement of the caudate lobe of the liver and subtle contour nodularity suggestive of cirrhosis. Wall thickening of the ascending colon through the splenic flexure with pericolonic stranding, consistent with colitis. Possibly reflecting portal  colopathy in the setting of possible cirrhosis although and infectious/inflammatory etiology would be the most pertinent differential consideration. Left-sided colonic diverticulosis.   Differential diagnosis considered included vitamin deficiencies, thyroid dysfunction, liver cirrhosis, and myelodysplastic syndrome (MDS).  Considering his age, MDS is a possibility.     On his consultation with us  on 03/17/2024, labs revealed white count of 2900 with ANC of 1400, normal differential.  No immature cells noted in the periphery.  Hemoglobin 11.2, MCV 90.6, platelet count 58,000.  TSH normal.  LDH slightly high at 229.  Haptoglobin slightly decreased at 11.  Iron studies showed no evidence of iron deficiency.  Vitamin B12, folic acid and methylmalonic acid were within normal limits.  Flow cytometry of peripheral blood was unremarkable.  ANA negative.   Since initial blood tests are inconclusive and there is still concern for MDS, recommended proceeding with bone marrow biopsy/aspiration and patient is agreeable.  Request submitted to IR.   Discussed potential progression of MDS to acute leukemia if untreated. Treatment options include transfusions or monthly chemotherapy injections if MDS is confirmed. Explained that the bone marrow biopsy will be performed under CT guidance with sedation and local anesthesia to minimize discomfort. - Ordered bone marrow biopsy through interventional radiology - Monitor blood counts for potential need for transfusions - Scheduled follow-up appointment on December 16th, 2025, to review biopsy results  Plan to see him in clinic with bone marrow biopsy results.    Dizziness possibly related to pancytopenia or blood pressure fluctuations Dizziness potentially related to low blood counts or blood pressure fluctuations. Blood pressure monitoring is advised to assess for hypotension or hypertension. Dehydration is also  a potential contributing factor. Emphasized the importance  of hydration and blood pressure monitoring to manage symptoms. - Monitor blood pressure daily - Ensure adequate hydration with water and Gatorade - Consider liquid IV for additional hydration  I discussed the assessment and treatment plan with the patient. The patient was provided an opportunity to ask questions and all were answered. The patient agreed with the plan and demonstrated an understanding of the instructions. The patient was advised to call back or seek an in-person evaluation if the symptoms worsen or if the condition fails to improve as anticipated.    I spent 23 minutes over the phone with the patient reviewing test results, discuss management and coordination/planning of care.  Chinita Patten, MD 04/01/2024 5:11 PM [facility] - A DEPT OF [facility] [address] [place] [address] Dept: [phone] Dept Fax: [phone]   INTERVAL HISTORY:  Please see above for problem oriented charting.  The purpose of today's discussion is to explain recent lab results and to formulate plan of care.  Discussed the use of AI scribe software for clinical note transcription with the patient, who gave verbal consent to proceed.  History of Present Illness Christopher Roberts is a 78 year old male who presents with low blood counts.  He has been experiencing pancytopenia, with recent lab results showing a white blood cell count of 2,900, hemoglobin of 11.2, and platelets of 58,000. A comprehensive workup was conducted, including tests for kidney and liver function, iron levels, vitamin B12, folic acid, and thyroid function, all of which were within normal limits. Tests for myeloma were negative, and the cause of the low blood counts remains undetermined.  He experiences dizziness, particularly when standing up quickly, describing a need to 'wait a minute before taking steps' to prevent dizziness.   SUMMARY OF  HEMATOLOGY HISTORY:  He was referred by Dr. [name] for evaluation of low blood counts.   Patient recently established with new primary care provider Dr. [name].  Routine labs on 03/12/2024 showed white count of 2900 with normal differential, ANC 1600, hemoglobin 10.8, hematocrit 32.5, MCV 90, platelet count 63,000.  He was referred to us  for further evaluation of pancytopenia.   On review of records, labs in March 2022 showed hemoglobin of 12, platelet count of 129,000.  White count was normal at 7500 at that time.  No labs available in the interim for review.   On 12/13/2023, CT abdomen and pelvis showed shrunken hepatic morphology with enlargement of the caudate lobe of the liver and subtle contour nodularity suggestive of cirrhosis. Wall thickening of the ascending colon through the splenic flexure with pericolonic stranding, consistent with colitis. Possibly reflecting portal colopathy in the setting of possible cirrhosis although and infectious/inflammatory etiology would be the most pertinent differential consideration. Left-sided colonic diverticulosis.    No new medications have been started. He denies blood in stools, black stools, nausea, vomiting, abdominal pain, fevers, chills, and night sweats. He experiences dizziness when standing up too quickly, which he manages by sitting up slowly and using support. He acknowledges not drinking enough water, consuming about three bottles a day.   He has a history of cirrhosis, with a scan in August showing signs of liver cirrhosis and changes in the colon. He stopped drinking heavily a couple of years ago after a period of heavy alcohol consumption that began in the 2000s. 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   He reports unintentional weight loss, losing about 15 pounds in a short period around August after stopping his medications. He resumed his medications, which coincided with the onset of his current  symptoms.   His current medications include metformin , which he takes once a day after previously taking it twice a day, which caused diarrhea. His diabetes is under good control with an A1c of 5.9. He stopped certain medications, including carvedilol , due to kidney function concerns.  Differential diagnosis considered included vitamin deficiencies, thyroid dysfunction, liver cirrhosis, and myelodysplastic syndrome (MDS).  Considering his age, MDS is a possibility.     On his consultation with us  on 03/17/2024, labs revealed white count of 2900 with ANC of 1400, normal differential.  No immature cells noted in the periphery.  Hemoglobin 11.2, MCV 90.6, platelet count 58,000.  TSH normal.  LDH slightly high at 229.  Haptoglobin slightly decreased at 11.  Iron studies showed no evidence of iron deficiency.  Vitamin B12, folic acid and methylmalonic acid were within normal limits.  Flow cytometry of peripheral blood was unremarkable.  ANA negative.   Since initial blood tests are inconclusive and there is still concern for MDS, recommended proceeding with bone marrow biopsy/aspiration and patient is agreeable.  Request submitted to IR.  Plan to see him in clinic with bone marrow biopsy results.    REVIEW OF SYSTEMS:    Review of Systems - Oncology  All other pertinent systems were reviewed with the patient and are negative.  I have reviewed the past medical history, past surgical history, social history and family history with the patient and they are unchanged from previous note.  ALLERGIES:  He has no known allergies.  MEDICATIONS:  Current Outpatient Medications  Medication Sig Dispense Refill   amLODipine  (NORVASC ) 10 MG tablet Take 0.5 tablets (5 mg total) by mouth daily. 60 tablet 2   aspirin  81 MG tablet Take 81 mg by mouth daily.     atorvastatin  (LIPITOR ) 80 MG tablet Take 1 tablet (80 mg total) by mouth daily at 6 PM. 90 tablet 3   carvedilol  (COREG ) 12.5 MG tablet Take 1 tablet  (12.5 mg total) by mouth 2 (two) times daily with a meal. 180 tablet 1   clopidogrel  (PLAVIX ) 75 MG tablet Take 1 tablet (75 mg total) by mouth daily. 90 tablet 1   FLUoxetine  (PROZAC ) 20 MG capsule Take 1 capsule (20 mg total) by mouth daily. 90 capsule 1   meclizine  (ANTIVERT ) 25 MG tablet Take 1 tablet (25 mg total) by mouth 3 (three) times daily as needed. 30 tablet 3   metFORMIN  (GLUCOPHAGE -XR) 500 MG 24 hr tablet Take 1 tablet (500 mg total) by mouth daily. 90 tablet 1   nicotine  (NICODERM CQ  - DOSED IN MG/24 HR) 7 mg/24hr patch Place 1 patch (7 mg total) onto the skin daily. (Patient not taking: Reported on 03/17/2024) 14 patch 0   nitroGLYCERIN  (NITROSTAT ) 0.4 MG SL tablet Place 1 tablet (0.4 mg total) under the tongue every 5 (five) minutes as needed for chest pain. 25 tablet 1   traZODone (DESYREL) 50 MG tablet Take 50 mg by mouth at bedtime as needed.     No current facility-administered medications for this visit.    PHYSICAL EXAMINATION:  Not performed today as it was a phone only visit  LABORATORY DATA:   I have reviewed the data as listed.  Recent Results (from the past 2160 hours)  POCT glycosylated hemoglobin (Hb  A1C)     Status: Abnormal   Collection Time: 03/12/24  2:08 PM  Result Value Ref Range   Hemoglobin A1C 5.9 (A) 4.0 - 5.6 %   HbA1c POC (<> result, manual entry)     HbA1c, POC (prediabetic range)     HbA1c, POC (controlled diabetic range)    CBC with Differential/Platelet     Status: Abnormal   Collection Time: 03/12/24  2:59 PM  Result Value Ref Range   WBC 2.9 (L) 3.4 - 10.8 x10E3/uL   RBC 3.62 (L) 4.14 - 5.80 x10E6/uL    Comment: Acanthocytes present. Polychromasia present    Hemoglobin 10.8 (L) 13.0 - 17.7 g/dL   Hematocrit 67.4 (L) 62.4 - 51.0 %   MCV 90 79 - 97 fL   MCH 29.8 26.6 - 33.0 pg   MCHC 33.2 31.5 - 35.7 g/dL   RDW 81.4 (H) 88.3 - 84.5 %   Platelets 63 (LL) 150 - 450 x10E3/uL    Comment: Actual platelet count may be somewhat higher  than reported due to aggregation of platelets in this sample.    Neutrophils 55 Not Estab. %   Lymphs 40 Not Estab. %   Monocytes 4 Not Estab. %   Eos 1 Not Estab. %   Basos 0 Not Estab. %   Neutrophils Absolute 1.6 1.4 - 7.0 x10E3/uL   Lymphocytes Absolute 1.1 0.7 - 3.1 x10E3/uL   Monocytes Absolute 0.1 0.1 - 0.9 x10E3/uL   EOS (ABSOLUTE) 0.0 0.0 - 0.4 x10E3/uL   Basophils Absolute 0.0 0.0 - 0.2 x10E3/uL   Immature Granulocytes 0 Not Estab. %   Immature Grans (Abs) 0.0 0.0 - 0.1 x10E3/uL   Hematology Comments: Note:     Comment: CBC met reflex criteria for review of peripheral smear by medical laboratory professional. Automated results were confirmed by smear review.   Comprehensive metabolic panel with GFR     Status: Abnormal   Collection Time: 03/12/24  2:59 PM  Result Value Ref Range   Glucose 161 (H) 70 - 99 mg/dL   BUN 19 8 - 27 mg/dL   Creatinine, Ser 8.51 (H) 0.76 - 1.27 mg/dL   eGFR 48 (L) >40 fO/fpw/8.26   BUN/Creatinine Ratio 13 10 - 24   Sodium 141 134 - 144 mmol/L   Potassium 4.2 3.5 - 5.2 mmol/L   Chloride 107 (H) 96 - 106 mmol/L   CO2 19 (L) 20 - 29 mmol/L   Calcium  8.7 8.6 - 10.2 mg/dL   Total Protein 6.9 6.0 - 8.5 g/dL   Albumin 3.6 (L) 3.8 - 4.8 g/dL   Globulin, Total 3.3 1.5 - 4.5 g/dL   Bilirubin Total 1.2 0.0 - 1.2 mg/dL   Alkaline Phosphatase 101 47 - 123 IU/L   AST 16 0 - 40 IU/L   ALT 11 0 - 44 IU/L  Hemoglobin A1c     Status: Abnormal   Collection Time: 03/12/24  2:59 PM  Result Value Ref Range   Hgb A1c MFr Bld 5.9 (H) 4.8 - 5.6 %    Comment:          Prediabetes: 5.7 - 6.4          Diabetes: >6.4          Glycemic control for adults with diabetes: <7.0    Est. average glucose Bld gHb Est-mCnc 123 mg/dL  Surgical pathology     Status: None   Collection Time: 03/17/24 12:00 AM  Result Value Ref Range  SURGICAL PATHOLOGY      Surgical Pathology CASE: [id number] PATIENT: Lavance Mcnamara Flow Pathology Report     Clinical history:  pancytopenia.  Please evaluate for lymphoma     DIAGNOSIS:  - No abnormal B or T-cell population identified   GATING AND PHENOTYPIC ANALYSIS:  Gated population: Flow cytometric immunophenotyping is performed using antibodies to the antigens listed in the table below. Electronic gates are placed around a cell cluster displaying light scatter properties corresponding to: lymphocytes  Abnormal Cells in gated population: N/A  Phenotype of Abnormal Cells: N/A                      Lymphoid Antigens       Myeloid Antigens Miscellaneous CD2  tested    CD10 tested    CD11b     ND   CD45 tested CD3  tested    CD19 tested    CD11c     ND   HLA-Dr    ND CD4  tested    CD20 tested    CD13 ND   CD34 tested CD5  tested    CD22 ND   CD14 ND   CD38 tested CD7  tested    CD79b     ND   CD15 ND   CD138     ND CD8  tested    CD103     ND   CD16 ND   TdT  ND CD25 ND    CD200     tested    CD33 ND   CD123     ND TCRab     ND   sKappa    tested    CD64 ND   CD41 ND TCRgd     tested    sLambda   tested    CD117     ND   CD61 ND CD56 tested    cKappa    ND   MPO  ND   CD71 ND CD57 ND   cLambda   ND             CD235a    ND       GROSS DESCRIPTION:  One lavender submitted from [facility] for lymphoma testing.    Final Diagnosis performed by Ilsa Pottier, MD.   Electronically signed 03/18/2024 Technical and / or Professional components performed at [facility], [address], [place], [place] [address].  The above tests were developed and their performance characteristics determined by the [facility] system for the physical and immunophenotypic characterization of cell populations. They have not been cleared by the U.S. Food and Drug administration. The  FDA has determined that such clearance or approval is not necessary. This test is used for clinical purposes. It should not be  regarded as investigational  or for research   Kappa/lambda light chains     Status:  Abnormal   Collection Time: 03/17/24 12:15 PM  Result Value Ref Range   Kappa free light chain 66.0 (H) 3.3 - 19.4 mg/L   Lambda free light chains 58.0 (H) 5.7 - 26.3 mg/L   Kappa, lambda light chain ratio 1.14 0.26 - 1.65    Comment: (NOTE) Performed At: [facility] [place] [address] [place], [place] [id number] Jennette Shorter MD Ey:[id number]   Multiple Myeloma Panel (SPEP&IFE w/QIG)     Status: Abnormal   Collection Time: 03/17/24 12:15 PM  Result Value Ref Range   IgG (Immunoglobin G), Serum 2,020 (H) 603 -  1,613 mg/dL   IgA 438 (H) 61 - 562 mg/dL   IgM (Immunoglobulin M), Srm 98 15 - 143 mg/dL   Total Protein ELP 7.7 6.0 - 8.5 g/dL   Albumin SerPl Elph-Mcnc 3.6 2.9 - 4.4 g/dL   Alpha 1 0.2 0.0 - 0.4 g/dL   Alpha2 Glob SerPl Elph-Mcnc 0.7 0.4 - 1.0 g/dL   B-Globulin SerPl Elph-Mcnc 1.1 0.7 - 1.3 g/dL   Gamma Glob SerPl Elph-Mcnc 2.1 (H) 0.4 - 1.8 g/dL   M Protein SerPl Elph-Mcnc Not Observed Not Observed g/dL   Globulin, Total 4.1 (H) 2.2 - 3.9 g/dL   Albumin/Glob SerPl 0.9 0.7 - 1.7   IFE 1 Comment (A)     Comment: Polyclonal increase detected in one or more immunoglobulins.   Please Note Comment     Comment: (NOTE) Protein electrophoresis scan will follow via computer, mail, or courier delivery. Performed At: [facility] [address] [place], [place] [id number] Jennette Shorter MD Ey:[id number]   Flow Cytometry, Peripheral Blood (Oncology)     Status: None   Collection Time: 03/17/24 12:15 PM  Result Value Ref Range   Flow Cytometry SEE SEPARATE REPORT     Comment: Performed at [facility], [address], [place], [place] [address]  ANA w/Reflex if Positive     Status: None   Collection Time: 03/17/24 12:15 PM  Result Value Ref Range   Anti Nuclear Antibody (ANA) Negative Negative    Comment: (NOTE) Performed At: [facility] [address] [place], [place] [id number] Jennette Shorter MD Ey:[id number]   TSH     Status:  None   Collection Time: 03/17/24 12:15 PM  Result Value Ref Range   TSH 2.270 0.350 - 4.500 uIU/mL    Comment: Performed at [facility], [address] [place], [place] [address]  Methylmalonic acid, serum     Status: None   Collection Time: 03/17/24 12:15 PM  Result Value Ref Range   Methylmalonic Acid, Quantitative 259 0 - 378 nmol/L    Comment: (NOTE) This test was developed and its performance characteristics determined by [facility]. It has not been cleared or approved by the Food and Drug Administration. Performed At: [facility] [address] [place], [place] [id number] Jennette Shorter MD Ey:[id number]   Haptoglobin     Status: Abnormal   Collection Time: 03/17/24 12:15 PM  Result Value Ref Range   Haptoglobin 11 (L) 34 - 355 mg/dL    Comment: (NOTE) Performed At: [facility] [address] [place], [place] [id number] Jennette Shorter MD Ey:[id number]   Folate     Status: None   Collection Time: 03/17/24 12:15 PM  Result Value Ref Range   Folate 10.2 >5.9 ng/mL    Comment: Performed at [facility], [address] [place], [place] [address]  Vitamin B12     Status: None   Collection Time: 03/17/24 12:15 PM  Result Value Ref Range   Vitamin B-12 324 180 - 914 pg/mL    Comment: (NOTE) This assay is not validated for testing neonatal or myeloproliferative syndrome specimens for Vitamin B12 levels. Performed at [facility], [address] [place], [place] [address]   Ferritin     Status: Abnormal   Collection Time: 03/17/24 12:15 PM  Result Value Ref Range   Ferritin 586 (H) 24 - 336 ng/mL    Comment: Performed at [facility], [address] [place], [place] [address]  Iron and TIBC  Status: Abnormal   Collection Time: 03/17/24 12:15 PM  Result Value Ref Range   Iron 163 45 - 182 ug/dL   TIBC 718 749 - 549 ug/dL   Saturation Ratios 58 (H) 17.9 - 39.5 %   UIBC 118 ug/dL    Comment:  Performed at [facility], [address], [place], [place] [address]  Lactate dehydrogenase     Status: Abnormal   Collection Time: 03/17/24 12:15 PM  Result Value Ref Range   LDH 229 (H) 98 - 192 U/L    Comment: Performed at [facility], [address], [place], [place] [address]  CMP (Cancer Center only)     Status: Abnormal   Collection Time: 03/17/24 12:15 PM  Result Value Ref Range   Sodium 141 135 - 145 mmol/L   Potassium 3.8 3.5 - 5.1 mmol/L   Chloride 107 98 - 111 mmol/L   CO2 22 22 - 32 mmol/L   Glucose, Bld 127 (H) 70 - 99 mg/dL    Comment: Glucose reference range applies only to samples taken after fasting for at least 8 hours.   BUN 14 8 - 23 mg/dL   Creatinine 8.92 9.38 - 1.24 mg/dL   Calcium  9.7 8.9 - 10.3 mg/dL   Total Protein 8.3 (H) 6.5 - 8.1 g/dL   Albumin 4.3 3.5 - 5.0 g/dL   AST 23 15 - 41 U/L   ALT 15 0 - 44 U/L   Alkaline Phosphatase 113 38 - 126 U/L   Total Bilirubin 1.4 (H) 0.0 - 1.2 mg/dL   GFR, Estimated >39 >39 mL/min    Comment: (NOTE) Calculated using the CKD-EPI Creatinine Equation (2021)    Anion gap 12 5 - 15    Comment: Performed at [facility], [address], [place], [place] [address]  CBC with Differential (Cancer Center Only)     Status: Abnormal   Collection Time: 03/17/24 12:15 PM  Result Value Ref Range   WBC Count 2.9 (L) 4.0 - 10.5 K/uL   RBC 3.71 (L) 4.22 - 5.81 MIL/uL   Hemoglobin 11.2 (L) 13.0 - 17.0 g/dL   HCT 66.3 (L) 60.9 - 47.9 %   MCV 90.6 80.0 - 100.0 fL   MCH 30.2 26.0 - 34.0 pg   MCHC 33.3 30.0 - 36.0 g/dL   RDW 80.6 (H) 88.4 - 84.4 %   Platelet Count 58 (L) 150 - 400 K/uL    Comment: REPEATED TO VERIFY   nRBC 0.0 0.0 - 0.2 %   Neutrophils Relative % 50 %   Neutro Abs 1.4 (L) 1.7 - 7.7 K/uL   Lymphocytes Relative 45 %   Lymphs Abs 1.3 0.7 - 4.0 K/uL   Monocytes Relative 4 %   Monocytes Absolute 0.1 0.1 - 1.0 K/uL   Eosinophils Relative 1 %   Eosinophils Absolute  0.0 0.0 - 0.5 K/uL   Basophils Relative 0 %   Basophils Absolute 0.0 0.0 - 0.1 K/uL   Immature Granulocytes 0 %   Abs Immature Granulocytes 0.01 0.00 - 0.07 K/uL    Comment: Performed at [facility], [address] [place], [place] [address]  Urine Microalbumin w/creat. ratio     Status: Abnormal   Collection Time: 03/17/24  3:41 PM  Result Value Ref Range   Creatinine, Urine 118.0 Not Estab. mg/dL   Microalbumin, Urine 63.2 Not Estab. ug/mL   Microalb/Creat Ratio 31 (H) 0 - 29 mg/g creat    Comment:  Normal:                0 -  29                        Moderately increased: 30 - 300                        Severely increased:       >300      RADIOGRAPHIC STUDIES:  No recent pertinent imaging studies available to review.  Orders Placed This Encounter  Procedures   CT BONE MARROW BIOPSY & ASPIRATION    Standing Status:   Future    Expected Date:   04/08/2024    Expiration Date:   04/01/2025    Reason for Exam (SYMPTOM  OR DIAGNOSIS REQUIRED):   Pancytopenia. Concern for MDS. Please evaluate with bone marrow biopsy/ aspiration.    Preferred imaging location?:   [facility]    Radiology Contrast Protocol - do NOT remove file path:   [email or web address]   CT Biopsy    Standing Status:   Future    Expected Date:   04/08/2024    Expiration Date:   04/01/2025    Lab orders requested (DO NOT place separate lab orders, these will be automatically ordered during procedure specimen collection)::   Surgical Pathology    Reason for Exam (SYMPTOM  OR DIAGNOSIS REQUIRED):   Pancytopenia. Concern for MDS. Please evaluate with bone marrow biopsy/ aspiration.    Preferred location?:   [facility]     Future Appointments  Date Time Provider Department Center  04/07/2024  9:00 AM Jordan, Peter M, MD [facility] H&V  04/22/2024 11:00 AM [facility] PHLEBOTOMIST [facility] None  04/22/2024 11:30 AM Everett Ricciardelli, Chinita,  MD [facility] None    This document was completed utilizing speech recognition software. Grammatical errors, random word insertions, pronoun errors, and incomplete sentences are an occasional consequence of this system due to software limitations, ambient noise, and hardware issues. Any formal questions or concerns about the content, text or information contained within the body of this dictation should be directly addressed to the provider for clarification.

## 2024-04-01 NOTE — Assessment & Plan Note (Signed)
 Patient recently established with new primary care provider Dr. [name].  Routine labs on 03/12/2024 showed white count of 2900 with normal differential, ANC 1600, hemoglobin 10.8, hematocrit 32.5, MCV 90, platelet count 63,000.  He was referred to us  for further evaluation of pancytopenia.  On review of records, labs in March 2022 showed hemoglobin of 12, platelet count of 129,000.  White count was normal at 7500 at that time.  No labs available in the interim for review.  On 12/13/2023, CT abdomen and pelvis showed shrunken hepatic morphology with enlargement of the caudate lobe of the liver and subtle contour nodularity suggestive of cirrhosis. Wall thickening of the ascending colon through the splenic flexure with pericolonic stranding, consistent with colitis. Possibly reflecting portal colopathy in the setting of possible cirrhosis although and infectious/inflammatory etiology would be the most pertinent differential consideration. Left-sided colonic diverticulosis.   Differential diagnosis considered included vitamin deficiencies, thyroid dysfunction, liver cirrhosis, and myelodysplastic syndrome (MDS).  Considering his age, MDS is a possibility.     On his consultation with us  on 03/17/2024, labs revealed white count of 2900 with ANC of 1400, normal differential.  No immature cells noted in the periphery.  Hemoglobin 11.2, MCV 90.6, platelet count 58,000.  TSH normal.  LDH slightly high at 229.  Haptoglobin slightly decreased at 11.  Iron studies showed no evidence of iron deficiency.  Vitamin B12, folic acid and methylmalonic acid were within normal limits.  Flow cytometry of peripheral blood was unremarkable.  ANA negative.   Since initial blood tests are inconclusive and there is still concern for MDS, recommended proceeding with bone marrow biopsy/aspiration and patient is agreeable.  Request submitted to IR.   Discussed potential progression of MDS to acute leukemia if untreated. Treatment  options include transfusions or monthly chemotherapy injections if MDS is confirmed. Explained that the bone marrow biopsy will be performed under CT guidance with sedation and local anesthesia to minimize discomfort. - Ordered bone marrow biopsy through interventional radiology - Monitor blood counts for potential need for transfusions - Scheduled follow-up appointment on December 16th, 2025, to review biopsy results  Plan to see him in clinic with bone marrow biopsy results.

## 2024-04-01 NOTE — Progress Notes (Deleted)
 Cardiology Office Note:    Date:  04/01/2024   ID:  Christopher, Roberts [birth year], MRN [id number]  PCP:  [name], Quintin PARAS, MD   [facility] Providers Cardiologist:  None { Click to update primary MD,subspecialty MD or APP then REFRESH:1}    Referring MD: Arloa Jarvis, NP   No chief complaint on file. ***  History of Present Illness:    Christopher Roberts is a 78 y.o. male seen for follow up CAD. He has a  PMH of coronary artery disease status post CABG in 2009, inferior STEMI 3/22 with unsuccessful PCI of his SVG-PDA. He had distal embolization of thrombus and total occlusion of SVG. His PMH also includes HTN and HLD, type 2 diabetes, and tobacco abuse. Echocardiogram showed an LVEF of 65-70%, severe LVH, G1 DD. During his admission for STEMI he was noted to have COVID. He was asymptomatic with his COVID infection and did not require supplemental oxygen.   Past Medical History:  Diagnosis Date   CAD (coronary artery disease)    a. s/p MI in 2005 (symptom of indigestion); b. CABG x 3 in 3/09: L-LAD, S-OM, EF unknown   Colon polyps    Diabetes mellitus    DJD (degenerative joint disease)    HLD (hyperlipidemia)    HTN (hypertension)    MI (myocardial infarction) (HCC) 2005   manifested by indigestion   Murmur    echo 6/12:  Upper septal thickening, no LVOT gradient,, EF 65%, mild LAE      Past Surgical History:  Procedure Laterality Date   CHOLECYSTECTOMY  2002   CORONARY ARTERY BYPASS GRAFT  2009   x3   CORONARY/GRAFT ACUTE MI REVASCULARIZATION N/A 07/25/2020   Procedure: Coronary/Graft Acute MI Revascularization;  Surgeon: Gesselle Fitzsimons M, MD;  Location: [facility] INVASIVE CV LAB;  Service: Cardiovascular;  Laterality: N/A;   LEFT HEART CATH AND CORS/GRAFTS ANGIOGRAPHY N/A 07/25/2020   Procedure: LEFT HEART CATH AND CORS/GRAFTS ANGIOGRAPHY;  Surgeon: Bellina Tokarczyk M, MD;  Location: [facility] INVASIVE CV LAB;  Service: Cardiovascular;  Laterality: N/A;   TONSILLECTOMY       Current Medications: No outpatient medications have been marked as taking for the 04/07/24 encounter (Appointment) with Marrah Vanevery M, MD.     Allergies:   Patient has no known allergies.   Social History   Socioeconomic History   Marital status: Legally Separated    Spouse name: Paulette   Number of children: 2   Years of education: 12   Highest education level: Not on file  Occupational History   Occupation: Retired  Tobacco Use   Smoking status: Former    Current packs/day: 0.20    Average packs/day: 0.2 packs/day for 40.0 years (8.0 ttl pk-yrs)    Types: Cigarettes    Passive exposure: Current   Smokeless tobacco: Never   Tobacco comments:    quit march 2010 after 20 yrs  Vaping Use   Vaping status: Never Used  Substance and Sexual Activity   Alcohol use: No   Drug use: No   Sexual activity: Not on file  Other Topics Concern   Not on file  Social History Narrative   Lives with wife Paulette    Caffeine use: Coffee- 2 mugs per day   Left handed   Social Drivers of Health   Financial Resource Strain: Low Risk  (03/12/2024)   Overall Financial Resource Strain (CARDIA)    Difficulty of Paying Living Expenses: Not hard at all  Food Insecurity: Food Insecurity Present (03/17/2024)   Hunger Vital Sign    Worried About Running Out of Food in the Last Year: Sometimes true    Ran Out of Food in the Last Year: Never true  Transportation Needs: No Transportation Needs (03/12/2024)   PRAPARE - Administrator, Civil Service (Medical): No    Lack of Transportation (Non-Medical): No  Physical Activity: Inactive (03/12/2024)   Exercise Vital Sign    Days of Exercise per Week: 0 days    Minutes of Exercise per Session: 0 min  Stress: No Stress Concern Present (03/12/2024)   [employer] of Occupational Health - Occupational Stress Questionnaire    Feeling of Stress: Not at all  Social Connections: Unknown (03/12/2024)   Social Connection and Isolation  Panel    Frequency of Communication with Friends and Family: More than three times a week    Frequency of Social Gatherings with Friends and Family: More than three times a week    Attends Religious Services: More than 4 times per year    Active Member of [employer] or Organizations: No    Attends Banker Meetings: Never    Marital Status: Patient unable to answer     Family History: The patient's ***family history includes Colon cancer in his mother; Diabetes in his father; Heart attack in his mother; High blood pressure in his father.  ROS:   Please see the history of present illness.    *** All other systems reviewed and are negative.  EKGs/Labs/Other Studies Reviewed:    The following studies were reviewed today: Echocardiogram 07/26/2020   Impressions: 1. LV cavity is small with near cavity oblteration during systole. No  signficant gradients at rest. . Left ventricular ejection fraction, by  estimation, is 65 to 70%. The left ventricle has normal function. The left  ventricle has no regional wall motion   abnormalities. There is severe left ventricular hypertrophy. Left  ventricular diastolic parameters are consistent with Grade I diastolic  dysfunction (impaired relaxation).   2. Right ventricular systolic function is normal. The right ventricular  size is normal.   3. The mitral valve is abnormal. Trivial mitral valve regurgitation.   4. The aortic valve is abnormal. Aortic valve regurgitation is mild. Mild  aortic valve sclerosis is present, with no evidence of aortic valve  stenosis.    Cardiac Catheterization 07/25/2020:   Prox LAD to Mid LAD lesion is 95% stenosed. 1st Diag lesion is 90% stenosed. Mid Cx lesion is 100% stenosed. Prox RCA to Dist RCA lesion is 100% stenosed. Dist RCA lesion is 100% stenosed with 100% stenosed side branch in RPDA. LIMA graft was visualized by angiography and is normal in caliber. The graft exhibits no disease. SVG graft  was visualized by angiography. RPDA lesion is 95% stenosed. Mid Graft to Insertion lesion is 99% stenosed. Post intervention, there is a 100% residual stenosis. Balloon angioplasty was performed using a BALLOON SAPPHIRE 3.0X20. The left ventricular systolic function is normal. LV end diastolic pressure is normal. The left ventricular ejection fraction is 55-65% by visual estimate.   1. 3 vessel occlusive CAD.     - 95% mid LAD. 90% diffuse small first diagonal    - 100% mid LCx    - 100% proximal RCA. 2. Patent LIMA to the LAD 3. Subtotal Occlusion of the SVG to PDA with extensive thrombosis. 4. Good LV function 5. Normal LVEDP 6. Unsuccessful PCI of the SVG to PDA with distal  embolization of thrombus and total occlusion of the SVG   Plan: Medical therapy. DAPT for ACS. Aggressive risk factor modification. Patient noted to be COVID positive.    Diagnostic Dominance: Right      Intervention             Recent Labs: 03/17/2024: ALT 15; BUN 14; Creatinine 1.07; Hemoglobin 11.2; Platelet Count 58; Potassium 3.8; Sodium 141; TSH 2.270  Recent Lipid Panel    Component Value Date/Time   CHOL 233 (H) 07/25/2020 1503   TRIG 101 07/25/2020 1503   HDL 51 07/25/2020 1503   CHOLHDL 4.6 07/25/2020 1503   VLDL 20 07/25/2020 1503   LDLCALC 162 (H) 07/25/2020 1503     Risk Assessment/Calculations:   {Does this patient have ATRIAL FIBRILLATION?[phone]}  No BP recorded.  {Refresh Note OR Click here to enter BP  :1}***         Physical Exam:    VS:  There were no vitals taken for this visit.    Wt Readings from Last 3 Encounters:  03/17/24 172 lb 6.4 oz (78.2 kg)  03/12/24 168 lb (76.2 kg)  12/27/23 171 lb 6.4 oz (77.7 kg)     GEN: *** Well nourished, well developed in no acute distress HEENT: Normal NECK: No JVD; No carotid bruits LYMPHATICS: No lymphadenopathy CARDIAC: ***RRR, no murmurs, rubs, gallops RESPIRATORY:  Clear to auscultation without rales, wheezing  or rhonchi  ABDOMEN: Soft, non-tender, non-distended MUSCULOSKELETAL:  No edema; No deformity  SKIN: Warm and dry NEUROLOGIC:  Alert and oriented x 3 PSYCHIATRIC:  Normal affect   ASSESSMENT:    No diagnosis found. PLAN:    In order of problems listed above:  1.  Coronary artery disease-denies recent exertional chest discomfort.  Fairly sedentary.  History of STEMI 3/22.  He had unsuccessful PCI of SVG-PDA with distal embolization of thrombus and total occlusion of SVG. Continue amlodipine , aspirin , atorvastatin , carvedilol , Plavix , sublingual nitroglycerin  Heart healthy low-sodium diet-reviewed Maintain physical activity   2. Essential hypertension-BP today 108/66 Maintain blood pressure log Continue low-sodium diet Continue amlodipine , carvedilol    3. Hyperlipidemia-compliant with statin therapy. Continue atorvastatin , clopidogrel  High-fiber diet Follows with PCP   4. SVT-asymptomatic.  Wore cardiac event monitor that was placed by his PCP.  He was noted to have 22 episodes of SVT.  Longest episode lasted for 29 beats at a rate of 102 bpm.  The fastest interval was 4 beats at a rate of 143 bpm. Continue carvedilol  Maintain p.o. hydration Avoid triggers May use vagal maneuvers   5. Type 2 diabetes-he continues compliance with metformin . Follows with PCP Carb modified diet   6. Tobacco abuse-continues to refrain from smoking. Congratulated and continued cessation encouraged   Disposition: Follow-up with Dr. Elvyn Krohn or me in 4-6 months.          {Are you ordering a CV Procedure (e.g. stress test, cath, DCCV, TEE, etc)?   Press F2        :[phone]}    Medication Adjustments/Labs and Tests Ordered: Current medicines are reviewed at length with the patient today.  Concerns regarding medicines are outlined above.  No orders of the defined types were placed in this encounter.  No orders of the defined types were placed in this encounter.   There are no Patient  Instructions on file for this visit.   Signed, Amariyah Bazar, MD  04/01/2024 1:53 PM    [facility]

## 2024-04-07 ENCOUNTER — Ambulatory Visit: Attending: Cardiology | Admitting: Cardiology

## 2024-04-07 ENCOUNTER — Ambulatory Visit: Admitting: Cardiology

## 2024-04-08 ENCOUNTER — Encounter: Payer: Self-pay | Admitting: Cardiology

## 2024-04-15 ENCOUNTER — Emergency Department (HOSPITAL_COMMUNITY)

## 2024-04-15 ENCOUNTER — Inpatient Hospital Stay (HOSPITAL_COMMUNITY)
Admission: EM | Admit: 2024-04-15 | Discharge: 2024-04-24 | Disposition: A | Source: Home / Self Care | Attending: Family Medicine | Admitting: Family Medicine

## 2024-04-15 ENCOUNTER — Telehealth (HOSPITAL_BASED_OUTPATIENT_CLINIC_OR_DEPARTMENT_OTHER): Payer: Self-pay | Admitting: Family Medicine

## 2024-04-15 ENCOUNTER — Encounter (HOSPITAL_COMMUNITY): Payer: Self-pay | Admitting: Internal Medicine

## 2024-04-15 ENCOUNTER — Other Ambulatory Visit: Payer: Self-pay | Admitting: Oncology

## 2024-04-15 DIAGNOSIS — N289 Disorder of kidney and ureter, unspecified: Secondary | ICD-10-CM | POA: Diagnosis present

## 2024-04-15 DIAGNOSIS — Z006 Encounter for examination for normal comparison and control in clinical research program: Secondary | ICD-10-CM

## 2024-04-15 DIAGNOSIS — Z800 Family history of malignant neoplasm of digestive organs: Secondary | ICD-10-CM

## 2024-04-15 DIAGNOSIS — Z79899 Other long term (current) drug therapy: Secondary | ICD-10-CM

## 2024-04-15 DIAGNOSIS — R339 Retention of urine, unspecified: Secondary | ICD-10-CM | POA: Diagnosis not present

## 2024-04-15 DIAGNOSIS — F17210 Nicotine dependence, cigarettes, uncomplicated: Secondary | ICD-10-CM | POA: Diagnosis present

## 2024-04-15 DIAGNOSIS — E119 Type 2 diabetes mellitus without complications: Secondary | ICD-10-CM

## 2024-04-15 DIAGNOSIS — G8191 Hemiplegia, unspecified affecting right dominant side: Secondary | ICD-10-CM | POA: Diagnosis present

## 2024-04-15 DIAGNOSIS — Z7984 Long term (current) use of oral hypoglycemic drugs: Secondary | ICD-10-CM

## 2024-04-15 DIAGNOSIS — R29700 NIHSS score 0: Secondary | ICD-10-CM | POA: Diagnosis not present

## 2024-04-15 DIAGNOSIS — R29703 NIHSS score 3: Secondary | ICD-10-CM | POA: Diagnosis present

## 2024-04-15 DIAGNOSIS — W06XXXA Fall from bed, initial encounter: Secondary | ICD-10-CM | POA: Diagnosis present

## 2024-04-15 DIAGNOSIS — I2583 Coronary atherosclerosis due to lipid rich plaque: Secondary | ICD-10-CM | POA: Diagnosis present

## 2024-04-15 DIAGNOSIS — I10 Essential (primary) hypertension: Secondary | ICD-10-CM | POA: Diagnosis present

## 2024-04-15 DIAGNOSIS — R4701 Aphasia: Secondary | ICD-10-CM | POA: Diagnosis present

## 2024-04-15 DIAGNOSIS — Z7982 Long term (current) use of aspirin: Secondary | ICD-10-CM

## 2024-04-15 DIAGNOSIS — I493 Ventricular premature depolarization: Secondary | ICD-10-CM | POA: Diagnosis present

## 2024-04-15 DIAGNOSIS — Z23 Encounter for immunization: Secondary | ICD-10-CM

## 2024-04-15 DIAGNOSIS — I639 Cerebral infarction, unspecified: Principal | ICD-10-CM | POA: Diagnosis present

## 2024-04-15 DIAGNOSIS — Z8673 Personal history of transient ischemic attack (TIA), and cerebral infarction without residual deficits: Secondary | ICD-10-CM

## 2024-04-15 DIAGNOSIS — F32A Depression, unspecified: Secondary | ICD-10-CM | POA: Diagnosis present

## 2024-04-15 DIAGNOSIS — D61818 Other pancytopenia: Secondary | ICD-10-CM | POA: Diagnosis present

## 2024-04-15 DIAGNOSIS — K7460 Unspecified cirrhosis of liver: Secondary | ICD-10-CM | POA: Diagnosis present

## 2024-04-15 DIAGNOSIS — Z951 Presence of aortocoronary bypass graft: Secondary | ICD-10-CM

## 2024-04-15 DIAGNOSIS — I6523 Occlusion and stenosis of bilateral carotid arteries: Principal | ICD-10-CM | POA: Diagnosis present

## 2024-04-15 DIAGNOSIS — I252 Old myocardial infarction: Secondary | ICD-10-CM

## 2024-04-15 DIAGNOSIS — I959 Hypotension, unspecified: Secondary | ICD-10-CM | POA: Diagnosis not present

## 2024-04-15 DIAGNOSIS — Z9049 Acquired absence of other specified parts of digestive tract: Secondary | ICD-10-CM

## 2024-04-15 DIAGNOSIS — Z9861 Coronary angioplasty status: Secondary | ICD-10-CM

## 2024-04-15 DIAGNOSIS — I63511 Cerebral infarction due to unspecified occlusion or stenosis of right middle cerebral artery: Secondary | ICD-10-CM | POA: Diagnosis present

## 2024-04-15 DIAGNOSIS — Z7902 Long term (current) use of antithrombotics/antiplatelets: Secondary | ICD-10-CM

## 2024-04-15 DIAGNOSIS — Z860100 Personal history of colon polyps, unspecified: Secondary | ICD-10-CM

## 2024-04-15 DIAGNOSIS — Y92009 Unspecified place in unspecified non-institutional (private) residence as the place of occurrence of the external cause: Secondary | ICD-10-CM

## 2024-04-15 DIAGNOSIS — E785 Hyperlipidemia, unspecified: Secondary | ICD-10-CM | POA: Diagnosis present

## 2024-04-15 DIAGNOSIS — Z635 Disruption of family by separation and divorce: Secondary | ICD-10-CM

## 2024-04-15 DIAGNOSIS — I2510 Atherosclerotic heart disease of native coronary artery without angina pectoris: Secondary | ICD-10-CM | POA: Diagnosis present

## 2024-04-15 DIAGNOSIS — Z833 Family history of diabetes mellitus: Secondary | ICD-10-CM

## 2024-04-15 DIAGNOSIS — E1169 Type 2 diabetes mellitus with other specified complication: Secondary | ICD-10-CM | POA: Diagnosis present

## 2024-04-15 DIAGNOSIS — Z8249 Family history of ischemic heart disease and other diseases of the circulatory system: Secondary | ICD-10-CM

## 2024-04-15 LAB — DIFFERENTIAL
Abs Immature Granulocytes: 0.01 K/uL (ref 0.00–0.07)
Basophils Absolute: 0 K/uL (ref 0.0–0.1)
Basophils Relative: 1 %
Eosinophils Absolute: 0 K/uL (ref 0.0–0.5)
Eosinophils Relative: 1 %
Immature Granulocytes: 1 %
Lymphocytes Relative: 45 %
Lymphs Abs: 0.9 K/uL (ref 0.7–4.0)
Monocytes Absolute: 0.1 K/uL (ref 0.1–1.0)
Monocytes Relative: 6 %
Neutro Abs: 1 K/uL — ABNORMAL LOW (ref 1.7–7.7)
Neutrophils Relative %: 46 %
Smear Review: NORMAL

## 2024-04-15 LAB — CBC
HCT: 32.1 % — ABNORMAL LOW (ref 39.0–52.0)
Hemoglobin: 10.3 g/dL — ABNORMAL LOW (ref 13.0–17.0)
MCH: 30.5 pg (ref 26.0–34.0)
MCHC: 32.1 g/dL (ref 30.0–36.0)
MCV: 95 fL (ref 80.0–100.0)
Platelets: 52 K/uL — ABNORMAL LOW (ref 150–400)
RBC: 3.38 MIL/uL — ABNORMAL LOW (ref 4.22–5.81)
RDW: 19.4 % — ABNORMAL HIGH (ref 11.5–15.5)
WBC: 2.1 K/uL — ABNORMAL LOW (ref 4.0–10.5)
nRBC: 0 % (ref 0.0–0.2)

## 2024-04-15 LAB — COMPREHENSIVE METABOLIC PANEL WITH GFR
ALT: 11 U/L (ref 0–44)
AST: 23 U/L (ref 15–41)
Albumin: 3.2 g/dL — ABNORMAL LOW (ref 3.5–5.0)
Alkaline Phosphatase: 104 U/L (ref 38–126)
Anion gap: 8 (ref 5–15)
BUN: 14 mg/dL (ref 8–23)
CO2: 21 mmol/L — ABNORMAL LOW (ref 22–32)
Calcium: 9 mg/dL (ref 8.9–10.3)
Chloride: 112 mmol/L — ABNORMAL HIGH (ref 98–111)
Creatinine, Ser: 1.13 mg/dL (ref 0.61–1.24)
GFR, Estimated: >60 mL/min (ref >60)
Glucose, Bld: 126 mg/dL — ABNORMAL HIGH (ref 70–99)
Potassium: 4 mmol/L (ref 3.5–5.1)
Sodium: 141 mmol/L (ref 135–145)
Total Bilirubin: 1.9 mg/dL — ABNORMAL HIGH (ref 0.0–1.2)
Total Protein: 7.3 g/dL (ref 6.5–8.1)

## 2024-04-15 LAB — ETHANOL: Alcohol, Ethyl (B): <15 mg/dL (ref <15)

## 2024-04-15 LAB — PROTIME-INR
INR: 1.3 — ABNORMAL HIGH (ref 0.8–1.2)
Prothrombin Time: 16.8 s — ABNORMAL HIGH (ref 11.4–15.2)

## 2024-04-15 LAB — CK: Total CK: 131 U/L (ref 49–397)

## 2024-04-15 LAB — CBG MONITORING, ED: Glucose-Capillary: 111 mg/dL — ABNORMAL HIGH (ref 70–99)

## 2024-04-15 LAB — APTT: aPTT: 23 s — ABNORMAL LOW (ref 24–36)

## 2024-04-15 LAB — RAPID URINE DRUG SCREEN, HOSP PERFORMED
Amphetamines: NOT DETECTED
Barbiturates: NOT DETECTED
Benzodiazepines: NOT DETECTED
Cocaine: NOT DETECTED
Opiates: NOT DETECTED
Tetrahydrocannabinol: NOT DETECTED

## 2024-04-15 MED ORDER — ACETAMINOPHEN 160 MG/5ML PO SOLN: 650.0000 mg | ORAL | Status: DC | PRN

## 2024-04-15 MED ORDER — CARVEDILOL 12.5 MG PO TABS
12.5000 mg | ORAL_TABLET | Freq: Two times a day (BID) | ORAL | Status: DC
  Administered 2024-04-16: 12.5 mg via ORAL
  Filled 2024-04-15: qty 1

## 2024-04-15 MED ORDER — CLOPIDOGREL BISULFATE 75 MG PO TABS
75.0000 mg | ORAL_TABLET | Freq: Every day | ORAL | Status: DC
  Administered 2024-04-16 – 2024-04-24 (×8): 75 mg via ORAL
  Filled 2024-04-15 (×8): qty 1

## 2024-04-15 MED ORDER — ATORVASTATIN CALCIUM 80 MG PO TABS
80.0000 mg | ORAL_TABLET | Freq: Every day | ORAL | Status: DC
  Administered 2024-04-17 – 2024-04-23 (×7): 80 mg via ORAL
  Filled 2024-04-15 (×7): qty 1

## 2024-04-15 MED ORDER — SODIUM CHLORIDE 0.9 % IV SOLN: INTRAVENOUS | Status: AC

## 2024-04-15 MED ORDER — FLUOXETINE HCL 20 MG PO CAPS
20.0000 mg | ORAL_CAPSULE | Freq: Every day | ORAL | Status: DC
  Administered 2024-04-16 – 2024-04-24 (×8): 20 mg via ORAL
  Filled 2024-04-15 (×8): qty 1

## 2024-04-15 MED ORDER — SODIUM CHLORIDE 0.9 % IV BOLUS
1000.0000 mL | Freq: Once | INTRAVENOUS | Status: AC
  Administered 2024-04-15: 1000 mL via INTRAVENOUS

## 2024-04-15 MED ORDER — STROKE: EARLY STAGES OF RECOVERY BOOK
Freq: Once | Status: AC
  Filled 2024-04-15: qty 1

## 2024-04-15 MED ORDER — ASPIRIN 81 MG PO CHEW
81.0000 mg | CHEWABLE_TABLET | Freq: Every day | ORAL | Status: DC
  Administered 2024-04-16 – 2024-04-24 (×8): 81 mg via ORAL
  Filled 2024-04-15 (×8): qty 1

## 2024-04-15 MED ORDER — ACETAMINOPHEN 325 MG PO TABS: 650.0000 mg | ORAL_TABLET | ORAL | Status: DC | PRN

## 2024-04-15 MED ORDER — INSULIN ASPART 100 UNIT/ML IJ SOLN
0.0000 [IU] | Freq: Three times a day (TID) | INTRAMUSCULAR | Status: DC
  Administered 2024-04-16 (×2): 1 [IU] via SUBCUTANEOUS
  Administered 2024-04-17: 2 [IU] via SUBCUTANEOUS
  Administered 2024-04-17: 3 [IU] via SUBCUTANEOUS
  Administered 2024-04-17: 2 [IU] via SUBCUTANEOUS
  Administered 2024-04-18: 1 [IU] via SUBCUTANEOUS
  Administered 2024-04-18: 3 [IU] via SUBCUTANEOUS
  Administered 2024-04-19: 1 [IU] via SUBCUTANEOUS
  Administered 2024-04-19 – 2024-04-20 (×3): 2 [IU] via SUBCUTANEOUS
  Administered 2024-04-20: 3 [IU] via SUBCUTANEOUS
  Administered 2024-04-20: 1 [IU] via SUBCUTANEOUS
  Filled 2024-04-15 (×4): qty 2
  Filled 2024-04-15 (×2): qty 1
  Filled 2024-04-15 (×2): qty 3
  Filled 2024-04-15: qty 1
  Filled 2024-04-15: qty 2
  Filled 2024-04-15: qty 3
  Filled 2024-04-15 (×2): qty 1

## 2024-04-15 MED ORDER — ACETAMINOPHEN 650 MG RE SUPP: 650.0000 mg | RECTAL | Status: DC | PRN

## 2024-04-15 NOTE — ED Triage Notes (Addendum)
 Pt BIB [facility] from home d/t waking up around 10 am and rolling out of bed on to the floor on right side.  Couldn't get up and laid there until EMS arrived at 1241.  He was sitting up and alert on EMS arrival.  EMS states pt right leg was flaccid on scene but has since improved.  But pt also was having trouble sitting up in stair chair. States he was leaning significantly.  Denies any hx of stroke and stroke screen neg for EMS aside from episode with leg. Pt and family endorses some ongoing intermitent dizziness for months.   Hx of MI.  Unclear if pt is on blood thinner. Pt says yes, wife says no per EMS  110/80 HR 70 irreg. 96& RA, CBG 199

## 2024-04-15 NOTE — Addendum Note (Signed)
 Addended by: RAYANN REXENE HERO on: 04/15/2024 04:54 PM   Modules accepted: Orders

## 2024-04-15 NOTE — ED Notes (Signed)
PT back from MRI. 

## 2024-04-15 NOTE — ED Notes (Signed)
 PT at [facility] upon assuming care for the night.

## 2024-04-15 NOTE — ED Provider Notes (Signed)
 [place] EMERGENCY DEPARTMENT AT [facility] Provider Note   CSN: [id number] Arrival date & time: 04/15/24  1347     Patient presents with: No chief complaint on file.   Christopher Roberts is a 78 y.o. male.   78 yo M with a chief complaint of falling out of bed.  This happened about 10 AM.  Patient was unable to get up off the ground because his right side did not work correctly.  On arrival here he was unable to move his right leg at all.  Has reportedly gotten back to baseline per him.  He is left-handed.  He denies any significant injury in the fall.  Denied any issue with his leg yesterday.        Prior to Admission medications   Medication Sig Start Date End Date Taking? Authorizing Provider  amLODipine  (NORVASC ) 10 MG tablet Take 0.5 tablets (5 mg total) by mouth daily. 03/12/24   [name], [name], MD  aspirin  81 MG tablet Take 81 mg by mouth daily.    [provider]  atorvastatin  (LIPITOR ) 80 MG tablet Take 1 tablet (80 mg total) by mouth daily at 6 PM. 03/12/24   [name], Quintin PARAS, MD  carvedilol  (COREG ) 12.5 MG tablet Take 1 tablet (12.5 mg total) by mouth 2 (two) times daily with a meal. 03/12/24   [name], Quintin PARAS, MD  clopidogrel  (PLAVIX ) 75 MG tablet Take 1 tablet (75 mg total) by mouth daily. 03/12/24   [name], [name], MD  FLUoxetine  (PROZAC ) 20 MG capsule Take 1 capsule (20 mg total) by mouth daily. 03/12/24   [name], [name], MD  meclizine  (ANTIVERT ) 25 MG tablet Take 1 tablet (25 mg total) by mouth 3 (three) times daily as needed. 03/12/24   [name], [name], MD  metFORMIN  (GLUCOPHAGE -XR) 500 MG 24 hr tablet Take 1 tablet (500 mg total) by mouth daily. 03/12/24   [name], Quintin PARAS, MD  nicotine  (NICODERM CQ  - DOSED IN MG/24 HR) 7 mg/24hr patch Place 1 patch (7 mg total) onto the skin daily. Patient not taking: Reported on 03/17/2024 08/17/20   Goodrich, Callie E, PA-C  nitroGLYCERIN  (NITROSTAT ) 0.4 MG SL tablet Place 1 tablet (0.4 mg total)  under the tongue every 5 (five) minutes as needed for chest pain. 03/12/24 01/16/30  [name], [name], MD  traZODone (DESYREL) 50 MG tablet Take 50 mg by mouth at bedtime as needed. 09/28/23   [provider]    Allergies: Patient has no known allergies.    Review of Systems  Updated Vital Signs BP (!) 143/65   Pulse 72   Temp 98 F (36.7 C)   Resp 20   SpO2 99%   Physical Exam Vitals and nursing note reviewed.  Constitutional:      Appearance: He is well-developed.  HENT:     Head: Normocephalic and atraumatic.  Eyes:     Pupils: Pupils are equal, round, and reactive to light.  Neck:     Vascular: No JVD.  Cardiovascular:     Rate and Rhythm: Normal rate and regular rhythm.     Heart sounds: No murmur heard.    No friction rub. No gallop.  Pulmonary:     Effort: No respiratory distress.     Breath sounds: No wheezing.  Abdominal:     General: There is no distension.     Tenderness: There is no abdominal tenderness. There is no guarding  or rebound.  Musculoskeletal:        General: Normal range of motion.     Cervical back: Normal range of motion and neck supple.  Skin:    Coloration: Skin is not pale.     Findings: No rash.  Neurological:     Mental Status: He is alert and oriented to person, place, and time.     GCS: GCS eye subscore is 4. GCS verbal subscore is 5. GCS motor subscore is 6.     Cranial Nerves: Cranial nerves 2-12 are intact.     Sensory: Sensation is intact.     Motor: Motor function is intact.     Coordination: Coordination is intact.     Comments: Patient with perhaps mild weakness on the right compared to the left leg worse than arm.  He does have significant issues with finger-nose and heel-to-shin on the right side.  Psychiatric:        Behavior: Behavior normal.     (all labs ordered are listed, but only abnormal results are displayed) Labs Reviewed  PROTIME-INR - Abnormal; Notable for the following components:      Result  Value   Prothrombin Time 16.8 (*)    INR 1.3 (*)    All other components within normal limits  APTT - Abnormal; Notable for the following components:   aPTT 23 (*)    All other components within normal limits  CBC - Abnormal; Notable for the following components:   WBC 2.1 (*)    RBC 3.38 (*)    Hemoglobin 10.3 (*)    HCT 32.1 (*)    RDW 19.4 (*)    Platelets 52 (*)    All other components within normal limits  DIFFERENTIAL - Abnormal; Notable for the following components:   Neutro Abs 1.0 (*)    All other components within normal limits  COMPREHENSIVE METABOLIC PANEL WITH GFR - Abnormal; Notable for the following components:   Chloride 112 (*)    CO2 21 (*)    Glucose, Bld 126 (*)    Albumin 3.2 (*)    Total Bilirubin 1.9 (*)    All other components within normal limits  CBG MONITORING, ED - Abnormal; Notable for the following components:   Glucose-Capillary 111 (*)    All other components within normal limits  ETHANOL  RAPID URINE DRUG SCREEN, HOSP PERFORMED  CK  PATHOLOGIST SMEAR REVIEW    EKG: EKG Interpretation Date/Time:  Tuesday April 15 2024 16:03:43 EST Ventricular Rate:  69 PR Interval:  148 QRS Duration:  116 QT Interval:  422 QTC Calculation: 453 R Axis:   62  Text Interpretation: Sinus rhythm Multiform ventricular premature complexes Nonspecific intraventricular conduction delay Otherwise no significant change Confirmed by Emil Share [phone]) on 04/15/2024 4:05:20 PM  Radiology: MR BRAIN WO CONTRAST Result Date: 04/15/2024 EXAM: MRI BRAIN WITHOUT CONTRAST 04/15/2024 07:46:35 PM TECHNIQUE: Multiplanar multisequence MRI of the head/brain was performed without the administration of intravenous contrast. COMPARISON: CT head from earlier today. MRI head 08/30/2016. CLINICAL HISTORY: Neuro deficit, acute, stroke suspected FINDINGS: BRAIN AND VENTRICLES: Punctate acute infarcts in the high right frontal and left parasagittal frontal lobes (series 2, images 39  and 37). No intracranial hemorrhage. No mass. No midline shift. No hydrocephalus. Advanced T2 hyperintensity in the white matter, compatible with chronic avascular ischemic disease. Remote lacunar infarcts in the corona radiata and bilateral parietal cortex. Multiple remote cerebellar infarcts bilaterally. Normal flow voids. ORBITS: No acute abnormality. SINUSES AND MASTOIDS:  No acute abnormality. BONES AND SOFT TISSUES: Normal marrow signal. No acute soft tissue abnormality. IMPRESSION: 1. Punctate acute infarcts in the high right frontal and left parasagittal frontal lobes. 2. 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 Electronically signed by: Gilmore Molt MD 04/15/2024 09:43 PM EST RP Workstation: [id number]   CT HEAD WO CONTRAST Result Date: 04/15/2024 CLINICAL DATA:  Clemens out of bed, neurologic deficit EXAM: CT HEAD WITHOUT CONTRAST TECHNIQUE: Contiguous axial images were obtained from the base of the skull through the vertex without intravenous contrast. RADIATION DOSE REDUCTION: This exam was performed according to the departmental dose-optimization program which includes automated exposure control, adjustment of the mA and/or kV according to patient size and/or use of iterative reconstruction technique. COMPARISON:  08/31/2016 FINDINGS: Brain: There are chronic cortical infarcts along the bilateral frontal and parietal convexities, as well as within the bilateral cerebellar hemispheres. Chronic small vessel ischemic changes are seen throughout the periventricular and subcortical white matter. No evidence of acute infarct or hemorrhage. Dense bilateral basal ganglia calcifications are again noted. Lateral ventricles and midline structures are otherwise unremarkable. No acute extra-axial fluid collections. No mass effect. Vascular: No hyperdense vessel or unexpected calcification. Skull: Normal. Negative for fracture or focal lesion. Sinuses/Orbits: No acute  finding. Other: None. IMPRESSION: 1. No acute intracranial process. 2. Chronic ischemic changes as above. Electronically Signed   By: Ozell Daring M.D.   On: 04/15/2024 17:50     Procedures   Medications Ordered in the ED  sodium chloride  0.9 % bolus 1,000 mL (0 mLs Intravenous Stopped 04/15/24 1733)                                    Medical Decision Making Amount and/or Complexity of Data Reviewed Labs: ordered. Radiology: ordered.   78 yo M with a chief complaints of right sided weakness.  Patient tried to get out of bed and did not feel like he could hold his weight on the right side.  Was unable to get up off the ground for about 3 hours.  Brought here by EMS.  Found to have right sided flaccidity however improved en route.  Patient states he feels like he is back at his baseline but does have some coordination issues with both the arm and leg but worse with the leg and arm.  Will obtain a CT of the head.  Will discuss with neurology.  I discussed case with Dr. Voncile, he recommended getting an MRI of the brain.  This has resulted and is positive for stroke.  Will notify neurology.  Will discuss with medicine.  The patients results and plan were reviewed and discussed.   Any x-rays performed were independently reviewed by myself.   Differential diagnosis were considered with the presenting HPI.  Medications  sodium chloride  0.9 % bolus 1,000 mL (0 mLs Intravenous Stopped 04/15/24 1733)    Vitals:   04/15/24 1743 04/15/24 1745 04/15/24 1900 04/15/24 2009  BP:  (!) 159/96 (!) 143/65   Pulse:  72 72   Resp:  (!) 21 20   Temp:  98 F (36.7 C)  98 F (36.7 C)  TempSrc:      SpO2: 100% 100% 99%     Final diagnoses:  Acute ischemic stroke [facility])    Admission/ observation were discussed with the admitting physician, patient and/or family and they are comfortable with the plan.  Final diagnoses:  Acute ischemic stroke [facility])    ED Discharge Orders     None           Emil Share, DO 04/15/24 2152

## 2024-04-15 NOTE — ED Notes (Signed)
 Pt and visitor requesting an update from the doctor about plan of care and asking about leaving. Dr. Emil notified.

## 2024-04-15 NOTE — Telephone Encounter (Signed)
 Copied from CRM #[id number]. Topic: Clinical - Medical Advice >> Apr 15, 2024 11:59 AM Anairis L wrote: Reason for CRM: Patient wife Medora would like to know how she would go about getting her husband a wheelchair.She would also like information for home care.   Please advise.  Please call.

## 2024-04-15 NOTE — Telephone Encounter (Signed)
 Order has been placed for wheelchair with [facility]. Patient's wife was informed.

## 2024-04-15 NOTE — Consult Note (Signed)
 NEUROLOGY CONSULT NOTE   Date of service: April 15, 2024 Patient Name: Christopher Roberts MRN:  [id number] DOB:  Jan 31, 1946 Chief Complaint: RLE weakness Requesting Provider: Emil Share, DO  History of Present Illness  Christopher Roberts is a 78 y.o. male with a PMHx of CAD s/p CABG, DM, DJD, HLD, HTN, colon polyps and heart murmur who was BIB [facility] from home for evaluation of acute onset of right sided weakness, first noted at 10 AM when patient fell out of bed. He was unable to get up off the floor due to right-sided weakness. He laid on the floor until EMS arrived at 1241. He was sitting up and alert on EMS arrival. EMS noted that his right leg was flaccid on scene but this had improved significantly at the time of examination by EDP.  Truncal weakness had also been noted - the patient was having trouble sitting up in a stair chair and was leaning significantly.  At time of EDP evaluation, the patient stated that he felt like he was back at his baseline but did have some coordination issues with his arm and leg, worse with the leg.   MRI brain was obtained, revealing punctate acute infarcts in the high right frontal and left parasagittal frontal lobes, in addition to advanced chronic microvascular ischemic disease and multiple remote infarcts.   Patient and family endorse some ongoing intermitent dizziness for months.     ROS  Comprehensive ROS performed and pertinent positives documented in HPI    Past History   Past Medical History:  Diagnosis Date   CAD (coronary artery disease)    a. s/p MI in 2005 (symptom of indigestion); b. CABG x 3 in 3/09: L-LAD, S-OM, EF unknown   Colon polyps    Diabetes mellitus    DJD (degenerative joint disease)    HLD (hyperlipidemia)    HTN (hypertension)    MI (myocardial infarction) (HCC) 2005   manifested by indigestion   Murmur    echo 6/12:  Upper septal thickening, no LVOT gradient,, EF 65%, mild LAE      Past Surgical History:  Procedure  Laterality Date   CHOLECYSTECTOMY  2002   CORONARY ARTERY BYPASS GRAFT  2009   x3   CORONARY/GRAFT ACUTE MI REVASCULARIZATION N/A 07/25/2020   Procedure: Coronary/Graft Acute MI Revascularization;  Surgeon: Jordan, Peter M, MD;  Location: [facility] INVASIVE CV LAB;  Service: Cardiovascular;  Laterality: N/A;   LEFT HEART CATH AND CORS/GRAFTS ANGIOGRAPHY N/A 07/25/2020   Procedure: LEFT HEART CATH AND CORS/GRAFTS ANGIOGRAPHY;  Surgeon: Jordan, Peter M, MD;  Location: [facility] INVASIVE CV LAB;  Service: Cardiovascular;  Laterality: N/A;   TONSILLECTOMY      Family History: Family History  Problem Relation Age of Onset   Colon cancer Mother    Heart attack Mother    High blood pressure Father    Diabetes Father     Social History  reports that he has quit smoking. His smoking use included cigarettes. He has a 8 pack-year smoking history. He has been exposed to tobacco smoke. He has never used smokeless tobacco. He reports that he does not drink alcohol and does not use drugs.  No Known Allergies  Medications  No current facility-administered medications for this encounter.  Current Outpatient Medications:    amLODipine  (NORVASC ) 10 MG tablet, Take 0.5 tablets (5 mg total) by mouth daily., Disp: 60 tablet, Rfl: 2   aspirin  81 MG tablet, Take 81 mg by mouth daily., Disp: ,  Rfl:    atorvastatin  (LIPITOR ) 80 MG tablet, Take 1 tablet (80 mg total) by mouth daily at 6 PM., Disp: 90 tablet, Rfl: 3   carvedilol  (COREG ) 12.5 MG tablet, Take 1 tablet (12.5 mg total) by mouth 2 (two) times daily with a meal., Disp: 180 tablet, Rfl: 1   clopidogrel  (PLAVIX ) 75 MG tablet, Take 1 tablet (75 mg total) by mouth daily., Disp: 90 tablet, Rfl: 1   FLUoxetine  (PROZAC ) 20 MG capsule, Take 1 capsule (20 mg total) by mouth daily., Disp: 90 capsule, Rfl: 1   meclizine  (ANTIVERT ) 25 MG tablet, Take 1 tablet (25 mg total) by mouth 3 (three) times daily as needed., Disp: 30 tablet, Rfl: 3   metFORMIN  (GLUCOPHAGE -XR) 500 MG  24 hr tablet, Take 1 tablet (500 mg total) by mouth daily., Disp: 90 tablet, Rfl: 1   nicotine  (NICODERM CQ  - DOSED IN MG/24 HR) 7 mg/24hr patch, Place 1 patch (7 mg total) onto the skin daily. (Patient not taking: Reported on 03/17/2024), Disp: 14 patch, Rfl: 0   nitroGLYCERIN  (NITROSTAT ) 0.4 MG SL tablet, Place 1 tablet (0.4 mg total) under the tongue every 5 (five) minutes as needed for chest pain., Disp: 25 tablet, Rfl: 1   traZODone (DESYREL) 50 MG tablet, Take 50 mg by mouth at bedtime as needed., Disp: , Rfl:   Vitals   Vitals:   2024-04-21 2115 2024/04/21 2130 2024/04/21 2145 21-Apr-2024 2200  BP:  127/83  (!) 140/115  Pulse: (!) 50 91 (!) 101 (!) 136  Resp: 18 18 17 15   Temp:      TempSrc:      SpO2: 100% 100% 100% 100%    There is no height or weight on file to calculate BMI.   Physical Exam   Constitutional: Appears well-developed and well-nourished.  Psych: Affect appropriate to situation.  Eyes: No scleral injection.  HENT: No OP obstruction.  Head: Normocephalic.  Respiratory: Effort normal, non-labored breathing.  Skin: WDI.   Neurologic Examination   Mental Status: Awake and alert. Oriented to the day of the week, the city and the state, but not the month or the year. Speech fluent with intact naming and comprehension. No dysarthria.  Cranial Nerves: II: Visual fields intact bilaterally. No extinction to DSS.   III,IV, VI: No ptosis. EOMI. No nystagmus.  V: Temp sensation equal bilaterally VII: Smile symmetric VIII: Hearing intact to conversation IX,X: No hoarseness or hypophonia XI: Symmetric XII: Midline tongue extension Motor: Right upper extremity 4/5 proximally and distally RLE 4/5 proximally and distally LUE and LLE 5/5 Mild right sided drift Sensory: Decreased temp sensation to RLE. Positive for extinction on the right with DSS.   Deep Tendon Reflexes: 1+ and symmetric bilateral patellae and brachioradialis Cerebellar: Positive for ataxia on the right  with with FNF and H-S. No ataxia on the left.  Gait: Deferred  Labs/Imaging/Neurodiagnostic studies   CBC:  Recent Labs  Lab 04-21-2024 1558  WBC 2.1*  NEUTROABS 1.0*  HGB 10.3*  HCT 32.1*  MCV 95.0  PLT 52*   Basic Metabolic Panel:  Lab Results  Component Value Date   NA 141 21-Apr-2024   K 4.0 04-21-24   CO2 21 (L) 2024-04-21   GLUCOSE 126 (H) 04-21-2024   BUN 14 04-21-2024   CREATININE 1.13 2024/04/21   CALCIUM  9.0 April 21, 2024   GFRNONAA >60 2024/04/21   GFRAA >60 08/31/2016   Lipid Panel:  Lab Results  Component Value Date   LDLCALC 162 (H) 07/25/2020  HgbA1c:  Lab Results  Component Value Date   HGBA1C 5.9 (H) 03/12/2024   Urine Drug Screen:     Component Value Date/Time   LABOPIA NONE DETECTED 04/15/2024 1611   COCAINSCRNUR NONE DETECTED 04/15/2024 1611   LABBENZ NONE DETECTED 04/15/2024 1611   AMPHETMU NONE DETECTED 04/15/2024 1611   THCU NONE DETECTED 04/15/2024 1611   LABBARB NONE DETECTED 04/15/2024 1611    Alcohol Level     Component Value Date/Time   ETH <15 04/15/2024 1558   INR  Lab Results  Component Value Date   INR 1.3 (H) 04/15/2024   APTT  Lab Results  Component Value Date   APTT 23 (L) 04/15/2024   Prior TTE (07/26/20): 1. LV cavity is small with near cavity oblteration during systole. No  signficant gradients at rest. . Left ventricular ejection fraction, by  estimation, is 65 to 70%. The left ventricle has normal function. The left  ventricle has no regional wall motion  abnormalities. There is severe  left ventricular hypertrophy. Left ventricular diastolic parameters are  consistent with Grade I diastolic dysfunction (impaired relaxation).   2. Right ventricular systolic function is normal. The right ventricular  size is normal.   3. The mitral valve is abnormal. Trivial mitral valve regurgitation.   4. The aortic valve is abnormal. Aortic valve regurgitation is mild. Mild  aortic valve sclerosis is present, with no  evidence of aortic valve  stenosis.   ASSESSMENT  78 y.o. male with a PMHx of CAD, DM, DJD, HLD, HTN, colon polyps and heart murmur who was BIB [facility] from home for evaluation of acute onset of right sided weakness, first noted at 10 AM when patient fell out of bed. He was unable to get up off the floor due to right-sided weakness. He laid on the floor until EMS arrived at 1241. He was sitting up and alert on EMS arrival. EMS noted that his right leg was flaccid on scene but this had improved significantly at the time of examination by EDP.  Truncal weakness had also been noted - the patient was having trouble sitting up in a stair chair and was leaning significantly.  At time of EDP evaluation, the patient stated that he felt like he was back at his baseline but did have some coordination issues with his arm and leg, worse with the leg.  - Exam reveals mild right sided weakness and ataxia, in addition to right sided sensory deficit.  - CT head: No acute intracranial process. Chronic ischemic changes - MRI brain: Punctate acute infarcts in the high right frontal and left parasagittal frontal lobes. Advanced chronic microvascular ischemic disease and multiple remote infarcts as follows: Lacunar infarcts in the corona radiata and bilateral parietal cortex; multiple remote cerebellar infarcts bilaterally.  - EKG: Sinus rhythm Multiform ventricular premature complexes Nonspecific intraventricular conduction delay - Labs:  - PT, INR and APTT are elevated.  - Recent HgbA1c was elevated at 5.9 - CBC reveals anemia, low WBC of 2.1 and thrombocytopenia with platelets of 52.  - Recent B12 level was 324, with normal MMA.  - CMP unremarkable from a stroke standpoint. BUN and Cr are normal.  - Impression: Acute ischemic infarctions bilaterally. Given the separate vascular territories, a cardioembolic mechanism is suspected.   RECOMMENDATIONS   *** ______________________________________________________________________    Bonney SHARK, Lilburn Straw, MD [facility]

## 2024-04-15 NOTE — H&P (Incomplete)
 History and Physical    Christopher Roberts FMW:[id number] DOB: Aug 21, 1945 DOA: 04/15/2024  Patient coming from: ***  Chief Complaint: ***  HPI: Christopher Roberts is a 78 y.o. male with ***   ED Course: ***  Review of Systems: As per HPI, rest all negative.   Past Medical History:  Diagnosis Date   CAD (coronary artery disease)    a. s/p MI in 2005 (symptom of indigestion); b. CABG x 3 in 3/09: L-LAD, S-OM, EF unknown   Colon polyps    Diabetes mellitus    DJD (degenerative joint disease)    HLD (hyperlipidemia)    HTN (hypertension)    MI (myocardial infarction) (HCC) 2005   manifested by indigestion   Murmur    echo 6/12:  Upper septal thickening, no LVOT gradient,, EF 65%, mild LAE      Past Surgical History:  Procedure Laterality Date   CHOLECYSTECTOMY  2002   CORONARY ARTERY BYPASS GRAFT  2009   x3   CORONARY/GRAFT ACUTE MI REVASCULARIZATION N/A 07/25/2020   Procedure: Coronary/Graft Acute MI Revascularization;  Surgeon: Jordan, Peter M, MD;  Location: [facility] INVASIVE CV LAB;  Service: Cardiovascular;  Laterality: N/A;   LEFT HEART CATH AND CORS/GRAFTS ANGIOGRAPHY N/A 07/25/2020   Procedure: LEFT HEART CATH AND CORS/GRAFTS ANGIOGRAPHY;  Surgeon: Jordan, Peter M, MD;  Location: [facility] INVASIVE CV LAB;  Service: Cardiovascular;  Laterality: N/A;   TONSILLECTOMY       reports that he has quit smoking. His smoking use included cigarettes. He has a 8 pack-year smoking history. He has been exposed to tobacco smoke. He has never used smokeless tobacco. He reports that he does not drink alcohol and does not use drugs.  No Known Allergies  Family History  Problem Relation Age of Onset   Colon cancer Mother    Heart attack Mother    High blood pressure Father    Diabetes Father     Prior to Admission medications   Medication Sig Start Date End Date Taking? Authorizing Provider  amLODipine  (NORVASC ) 10 MG tablet Take 0.5 tablets (5 mg total) by mouth daily. 03/12/24  Yes [name],  [name], MD  aspirin  81 MG tablet Take 81 mg by mouth daily.   Yes [provider]  atorvastatin  (LIPITOR ) 80 MG tablet Take 1 tablet (80 mg total) by mouth daily at 6 PM. 03/12/24  Yes [name], Quintin PARAS, MD  carvedilol  (COREG ) 12.5 MG tablet Take 1 tablet (12.5 mg total) by mouth 2 (two) times daily with a meal. 03/12/24  Yes [name], [name], MD  clopidogrel  (PLAVIX ) 75 MG tablet Take 1 tablet (75 mg total) by mouth daily. 03/12/24  Yes [name], [name], MD  FLUoxetine  (PROZAC ) 20 MG capsule Take 1 capsule (20 mg total) by mouth daily. 03/12/24  Yes [name], [name], MD  metFORMIN  (GLUCOPHAGE -XR) 500 MG 24 hr tablet Take 1 tablet (500 mg total) by mouth daily. 03/12/24  Yes [name], [name], MD  nitroGLYCERIN  (NITROSTAT ) 0.4 MG SL tablet Place 1 tablet (0.4 mg total) under the tongue every 5 (five) minutes as needed for chest pain. 03/12/24 01/16/30 Yes [name], [name], MD  traZODone (DESYREL) 50 MG tablet Take 50 mg by mouth at bedtime as needed. 09/28/23  Yes [provider]  meclizine  (ANTIVERT ) 25 MG tablet Take 1 tablet (25 mg total) by mouth 3 (three) times daily as needed. Patient not taking: Reported on 04/15/2024 03/12/24   de  [place], [name], MD    Physical Exam: Constitutional: *** Vitals:   04/15/24 2115 04/15/24 2130 04/15/24 2145 04/15/24 2200  BP:  127/83  (!) 140/115  Pulse: (!) 50 91 (!) 101 (!) 136  Resp: 18 18 17 15   Temp:      TempSrc:      SpO2: 100% 100% 100% 100%   Eyes: *** ENMT: *** Neck: *** Respiratory: *** Cardiovascular: *** Abdomen: *** Musculoskeletal: *** Skin: *** Neurologic:*** Psychiatric: ***   Labs on Admission: I have personally reviewed following labs and imaging studies  CBC: Recent Labs  Lab 04/15/24 1558  WBC 2.1*  NEUTROABS 1.0*  HGB 10.3*  HCT 32.1*  MCV 95.0  PLT 52*   Basic Metabolic Panel: Recent Labs  Lab 04/15/24 1558  NA 141  K 4.0  CL 112*  CO2 21*  GLUCOSE 126*  BUN 14  CREATININE  1.13  CALCIUM  9.0   GFR: CrCl cannot be calculated (Unknown ideal weight.). Liver Function Tests: Recent Labs  Lab 04/15/24 1558  AST 23  ALT 11  ALKPHOS 104  BILITOT 1.9*  PROT 7.3  ALBUMIN 3.2*   No results for input(s): LIPASE, AMYLASE in the last 168 hours. No results for input(s): AMMONIA in the last 168 hours. Coagulation Profile: Recent Labs  Lab 04/15/24 1558  INR 1.3*   Cardiac Enzymes: Recent Labs  Lab 04/15/24 1558  CKTOTAL 131   BNP (last 3 results) No results for input(s): PROBNP in the last 8760 hours. HbA1C: No results for input(s): HGBA1C in the last 72 hours. CBG: Recent Labs  Lab 04/15/24 1559  GLUCAP 111*   Lipid Profile: No results for input(s): CHOL, HDL, LDLCALC, TRIG, CHOLHDL, LDLDIRECT in the last 72 hours. Thyroid  Function Tests: No results for input(s): TSH, T4TOTAL, FREET4, T3FREE, THYROIDAB in the last 72 hours. Anemia Panel: No results for input(s): VITAMINB12, FOLATE, FERRITIN, TIBC, IRON, RETICCTPCT in the last 72 hours. Urine analysis:    Component Value Date/Time   COLORURINE YELLOW 08/30/2016 1009   APPEARANCEUR CLEAR 08/30/2016 1009   LABSPEC 1.017 08/30/2016 1009   PHURINE 5.0 08/30/2016 1009   GLUCOSEU >=500 (A) 08/30/2016 1009   HGBUR SMALL (A) 08/30/2016 1009   BILIRUBINUR NEGATIVE 08/30/2016 1009   KETONESUR 5 (A) 08/30/2016 1009   PROTEINUR >=300 (A) 08/30/2016 1009   NITRITE NEGATIVE 08/30/2016 1009   LEUKOCYTESUR NEGATIVE 08/30/2016 1009   Sepsis Labs: @LABRCNTIP (procalcitonin:4,lacticidven:4) )No results found for this or any previous visit (from the past 240 hours).   Radiological Exams on Admission: MR BRAIN WO CONTRAST Result Date: 04/15/2024 EXAM: MRI BRAIN WITHOUT CONTRAST 04/15/2024 07:46:35 PM TECHNIQUE: Multiplanar multisequence MRI of the head/brain was performed without the administration of intravenous contrast. COMPARISON: CT head from earlier today.  MRI head 08/30/2016. CLINICAL HISTORY: Neuro deficit, acute, stroke suspected FINDINGS: BRAIN AND VENTRICLES: Punctate acute infarcts in the high right frontal and left parasagittal frontal lobes (series 2, images 39 and 37). No intracranial hemorrhage. No mass. No midline shift. No hydrocephalus. Advanced T2 hyperintensity in the white matter, compatible with chronic avascular ischemic disease. Remote lacunar infarcts in the corona radiata and bilateral parietal cortex. Multiple remote cerebellar infarcts bilaterally. Normal flow voids. ORBITS: No acute abnormality. SINUSES AND MASTOIDS: No acute abnormality. BONES AND SOFT TISSUES: Normal marrow signal. No acute soft tissue abnormality. IMPRESSION: 1. Punctate acute infarcts in the high right frontal and left parasagittal frontal lobes. 2. 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 Electronically signed by:  Gilmore Molt MD 04/15/2024 09:43 PM EST RP Workstation: [id number]   CT HEAD WO CONTRAST Result Date: 04/15/2024 CLINICAL DATA:  Clemens out of bed, neurologic deficit EXAM: CT HEAD WITHOUT CONTRAST TECHNIQUE: Contiguous axial images were obtained from the base of the skull through the vertex without intravenous contrast. RADIATION DOSE REDUCTION: This exam was performed according to the departmental dose-optimization program which includes automated exposure control, adjustment of the mA and/or kV according to patient size and/or use of iterative reconstruction technique. COMPARISON:  08/31/2016 FINDINGS: Brain: There are chronic cortical infarcts along the bilateral frontal and parietal convexities, as well as within the bilateral cerebellar hemispheres. Chronic small vessel ischemic changes are seen throughout the periventricular and subcortical white matter. No evidence of acute infarct or hemorrhage. Dense bilateral basal ganglia calcifications are again noted. Lateral ventricles and midline structures are  otherwise unremarkable. No acute extra-axial fluid collections. No mass effect. Vascular: No hyperdense vessel or unexpected calcification. Skull: Normal. Negative for fracture or focal lesion. Sinuses/Orbits: No acute finding. Other: None. IMPRESSION: 1. No acute intracranial process. 2. Chronic ischemic changes as above. Electronically Signed   By: Ozell Daring M.D.   On: 04/15/2024 17:50    EKG: Independently reviewed. ***  Assessment/Plan Principal Problem:   Acute CVA (cerebrovascular accident) (HCC) Active Problems:   Hyperlipidemia   Essential hypertension   Coronary artery disease due to lipid rich plaque   Diabetes mellitus (HCC)   Cirrhosis of liver (HCC)    ***   DVT prophylaxis: *** Code Status: ***  Family Communication: ***  Disposition Plan: ***  Consults called: ***  Admission status: ***

## 2024-04-16 ENCOUNTER — Observation Stay (HOSPITAL_COMMUNITY)

## 2024-04-16 ENCOUNTER — Other Ambulatory Visit: Payer: Self-pay

## 2024-04-16 ENCOUNTER — Inpatient Hospital Stay (HOSPITAL_COMMUNITY)

## 2024-04-16 DIAGNOSIS — I6529 Occlusion and stenosis of unspecified carotid artery: Secondary | ICD-10-CM | POA: Diagnosis not present

## 2024-04-16 DIAGNOSIS — E7800 Pure hypercholesterolemia, unspecified: Secondary | ICD-10-CM | POA: Diagnosis not present

## 2024-04-16 DIAGNOSIS — R339 Retention of urine, unspecified: Secondary | ICD-10-CM | POA: Diagnosis not present

## 2024-04-16 DIAGNOSIS — Z7984 Long term (current) use of oral hypoglycemic drugs: Secondary | ICD-10-CM | POA: Diagnosis not present

## 2024-04-16 DIAGNOSIS — R509 Fever, unspecified: Secondary | ICD-10-CM | POA: Diagnosis not present

## 2024-04-16 DIAGNOSIS — I6522 Occlusion and stenosis of left carotid artery: Secondary | ICD-10-CM | POA: Diagnosis not present

## 2024-04-16 DIAGNOSIS — R072 Precordial pain: Secondary | ICD-10-CM | POA: Diagnosis not present

## 2024-04-16 DIAGNOSIS — I63512 Cerebral infarction due to unspecified occlusion or stenosis of left middle cerebral artery: Secondary | ICD-10-CM | POA: Diagnosis not present

## 2024-04-16 DIAGNOSIS — I69341 Monoplegia of lower limb following cerebral infarction affecting right dominant side: Secondary | ICD-10-CM | POA: Diagnosis not present

## 2024-04-16 DIAGNOSIS — Z794 Long term (current) use of insulin: Secondary | ICD-10-CM | POA: Diagnosis not present

## 2024-04-16 DIAGNOSIS — D61818 Other pancytopenia: Secondary | ICD-10-CM | POA: Diagnosis present

## 2024-04-16 DIAGNOSIS — K5901 Slow transit constipation: Secondary | ICD-10-CM | POA: Diagnosis not present

## 2024-04-16 DIAGNOSIS — I63233 Cerebral infarction due to unspecified occlusion or stenosis of bilateral carotid arteries: Secondary | ICD-10-CM | POA: Diagnosis not present

## 2024-04-16 DIAGNOSIS — Z7902 Long term (current) use of antithrombotics/antiplatelets: Secondary | ICD-10-CM | POA: Diagnosis not present

## 2024-04-16 DIAGNOSIS — N179 Acute kidney failure, unspecified: Secondary | ICD-10-CM

## 2024-04-16 DIAGNOSIS — E785 Hyperlipidemia, unspecified: Secondary | ICD-10-CM | POA: Diagnosis present

## 2024-04-16 DIAGNOSIS — I63511 Cerebral infarction due to unspecified occlusion or stenosis of right middle cerebral artery: Secondary | ICD-10-CM | POA: Diagnosis present

## 2024-04-16 DIAGNOSIS — Z006 Encounter for examination for normal comparison and control in clinical research program: Secondary | ICD-10-CM | POA: Diagnosis not present

## 2024-04-16 DIAGNOSIS — I129 Hypertensive chronic kidney disease with stage 1 through stage 4 chronic kidney disease, or unspecified chronic kidney disease: Secondary | ICD-10-CM

## 2024-04-16 DIAGNOSIS — Z833 Family history of diabetes mellitus: Secondary | ICD-10-CM | POA: Diagnosis not present

## 2024-04-16 DIAGNOSIS — I6389 Other cerebral infarction: Secondary | ICD-10-CM

## 2024-04-16 DIAGNOSIS — I639 Cerebral infarction, unspecified: Secondary | ICD-10-CM | POA: Diagnosis present

## 2024-04-16 DIAGNOSIS — Z7982 Long term (current) use of aspirin: Secondary | ICD-10-CM | POA: Diagnosis not present

## 2024-04-16 DIAGNOSIS — I63523 Cerebral infarction due to unspecified occlusion or stenosis of bilateral anterior cerebral arteries: Secondary | ICD-10-CM

## 2024-04-16 DIAGNOSIS — I779 Disorder of arteries and arterioles, unspecified: Secondary | ICD-10-CM | POA: Diagnosis not present

## 2024-04-16 DIAGNOSIS — I10 Essential (primary) hypertension: Secondary | ICD-10-CM | POA: Diagnosis present

## 2024-04-16 DIAGNOSIS — E119 Type 2 diabetes mellitus without complications: Secondary | ICD-10-CM | POA: Diagnosis present

## 2024-04-16 DIAGNOSIS — R4701 Aphasia: Secondary | ICD-10-CM | POA: Diagnosis not present

## 2024-04-16 DIAGNOSIS — Z8249 Family history of ischemic heart disease and other diseases of the circulatory system: Secondary | ICD-10-CM | POA: Diagnosis not present

## 2024-04-16 DIAGNOSIS — I6523 Occlusion and stenosis of bilateral carotid arteries: Secondary | ICD-10-CM | POA: Diagnosis present

## 2024-04-16 DIAGNOSIS — N189 Chronic kidney disease, unspecified: Secondary | ICD-10-CM | POA: Diagnosis not present

## 2024-04-16 DIAGNOSIS — F17210 Nicotine dependence, cigarettes, uncomplicated: Secondary | ICD-10-CM | POA: Diagnosis present

## 2024-04-16 DIAGNOSIS — F32A Depression, unspecified: Secondary | ICD-10-CM | POA: Diagnosis present

## 2024-04-16 DIAGNOSIS — K7460 Unspecified cirrhosis of liver: Secondary | ICD-10-CM | POA: Diagnosis present

## 2024-04-16 DIAGNOSIS — I63239 Cerebral infarction due to unspecified occlusion or stenosis of unspecified carotid arteries: Secondary | ICD-10-CM | POA: Diagnosis not present

## 2024-04-16 DIAGNOSIS — I959 Hypotension, unspecified: Secondary | ICD-10-CM | POA: Diagnosis not present

## 2024-04-16 DIAGNOSIS — K5900 Constipation, unspecified: Secondary | ICD-10-CM | POA: Diagnosis not present

## 2024-04-16 DIAGNOSIS — Z87891 Personal history of nicotine dependence: Secondary | ICD-10-CM | POA: Diagnosis not present

## 2024-04-16 DIAGNOSIS — I951 Orthostatic hypotension: Secondary | ICD-10-CM | POA: Diagnosis not present

## 2024-04-16 DIAGNOSIS — I69320 Aphasia following cerebral infarction: Secondary | ICD-10-CM | POA: Diagnosis not present

## 2024-04-16 DIAGNOSIS — I2510 Atherosclerotic heart disease of native coronary artery without angina pectoris: Secondary | ICD-10-CM | POA: Diagnosis present

## 2024-04-16 DIAGNOSIS — I2583 Coronary atherosclerosis due to lipid rich plaque: Secondary | ICD-10-CM | POA: Diagnosis present

## 2024-04-16 DIAGNOSIS — Z951 Presence of aortocoronary bypass graft: Secondary | ICD-10-CM | POA: Diagnosis not present

## 2024-04-16 DIAGNOSIS — R29700 NIHSS score 0: Secondary | ICD-10-CM | POA: Diagnosis not present

## 2024-04-16 DIAGNOSIS — Z79899 Other long term (current) drug therapy: Secondary | ICD-10-CM | POA: Diagnosis not present

## 2024-04-16 DIAGNOSIS — I252 Old myocardial infarction: Secondary | ICD-10-CM | POA: Diagnosis not present

## 2024-04-16 DIAGNOSIS — R29703 NIHSS score 3: Secondary | ICD-10-CM | POA: Diagnosis present

## 2024-04-16 DIAGNOSIS — Y92009 Unspecified place in unspecified non-institutional (private) residence as the place of occurrence of the external cause: Secondary | ICD-10-CM | POA: Diagnosis not present

## 2024-04-16 DIAGNOSIS — Z23 Encounter for immunization: Secondary | ICD-10-CM | POA: Diagnosis present

## 2024-04-16 DIAGNOSIS — R739 Hyperglycemia, unspecified: Secondary | ICD-10-CM | POA: Diagnosis not present

## 2024-04-16 DIAGNOSIS — B964 Proteus (mirabilis) (morganii) as the cause of diseases classified elsewhere: Secondary | ICD-10-CM | POA: Diagnosis not present

## 2024-04-16 DIAGNOSIS — N390 Urinary tract infection, site not specified: Secondary | ICD-10-CM | POA: Diagnosis not present

## 2024-04-16 DIAGNOSIS — G8191 Hemiplegia, unspecified affecting right dominant side: Secondary | ICD-10-CM | POA: Diagnosis present

## 2024-04-16 DIAGNOSIS — W06XXXA Fall from bed, initial encounter: Secondary | ICD-10-CM | POA: Diagnosis present

## 2024-04-16 LAB — COMPREHENSIVE METABOLIC PANEL WITH GFR
ALT: 16 U/L (ref 0–44)
AST: 35 U/L (ref 15–41)
Albumin: 2.9 g/dL — ABNORMAL LOW (ref 3.5–5.0)
Alkaline Phosphatase: 98 U/L (ref 38–126)
Anion gap: 6 (ref 5–15)
BUN: 14 mg/dL (ref 8–23)
CO2: 21 mmol/L — ABNORMAL LOW (ref 22–32)
Calcium: 8.1 mg/dL — ABNORMAL LOW (ref 8.9–10.3)
Chloride: 111 mmol/L (ref 98–111)
Creatinine, Ser: 1.27 mg/dL — ABNORMAL HIGH (ref 0.61–1.24)
GFR, Estimated: 57 mL/min — ABNORMAL LOW (ref >60)
Glucose, Bld: 137 mg/dL — ABNORMAL HIGH (ref 70–99)
Potassium: 4.4 mmol/L (ref 3.5–5.1)
Sodium: 138 mmol/L (ref 135–145)
Total Bilirubin: 2.4 mg/dL — ABNORMAL HIGH (ref 0.0–1.2)
Total Protein: 6.4 g/dL — ABNORMAL LOW (ref 6.5–8.1)

## 2024-04-16 LAB — CBG MONITORING, ED
Glucose-Capillary: 139 mg/dL — ABNORMAL HIGH (ref 70–99)
Glucose-Capillary: 123 mg/dL — ABNORMAL HIGH (ref 70–99)

## 2024-04-16 LAB — LIPID PANEL
Cholesterol: 125 mg/dL (ref 0–200)
HDL: 38 mg/dL — ABNORMAL LOW (ref >40)
LDL Cholesterol: 78 mg/dL (ref 0–99)
Total CHOL/HDL Ratio: 3.3 ratio
Triglycerides: 44 mg/dL (ref <150)
VLDL: 9 mg/dL (ref 0–40)

## 2024-04-16 LAB — CBC
HCT: 27.7 % — ABNORMAL LOW (ref 39.0–52.0)
Hemoglobin: 9.3 g/dL — ABNORMAL LOW (ref 13.0–17.0)
MCH: 30.5 pg (ref 26.0–34.0)
MCHC: 33.6 g/dL (ref 30.0–36.0)
MCV: 90.8 fL (ref 80.0–100.0)
Platelets: 45 K/uL — ABNORMAL LOW (ref 150–400)
RBC: 3.05 MIL/uL — ABNORMAL LOW (ref 4.22–5.81)
RDW: 19.5 % — ABNORMAL HIGH (ref 11.5–15.5)
WBC: 1.8 K/uL — ABNORMAL LOW (ref 4.0–10.5)
nRBC: 0 % (ref 0.0–0.2)

## 2024-04-16 LAB — GLUCOSE, CAPILLARY: Glucose-Capillary: 203 mg/dL — ABNORMAL HIGH (ref 70–99)

## 2024-04-16 LAB — ECHOCARDIOGRAM COMPLETE
Area-P 1/2: 2.55 cm2
S' Lateral: 3.4 cm

## 2024-04-16 LAB — PATHOLOGIST SMEAR REVIEW

## 2024-04-16 LAB — HEMOGLOBIN A1C
Hgb A1c MFr Bld: 5.6 % (ref 4.8–5.6)
Mean Plasma Glucose: 114.02 mg/dL

## 2024-04-16 MED ORDER — INFLUENZA VAC SPLIT HIGH-DOSE 0.5 ML IM SUSY
0.5000 mL | PREFILLED_SYRINGE | INTRAMUSCULAR | Status: AC
  Administered 2024-04-17: 0.5 mL via INTRAMUSCULAR
  Filled 2024-04-16 (×2): qty 0.5

## 2024-04-16 MED ORDER — SODIUM CHLORIDE 0.9 % IV BOLUS: 1000.0000 mL | Freq: Once | INTRAVENOUS | Status: DC

## 2024-04-16 MED ORDER — IOHEXOL 350 MG/ML SOLN
75.0000 mL | Freq: Once | INTRAVENOUS | Status: AC | PRN
  Administered 2024-04-16: 75 mL via INTRAVENOUS

## 2024-04-16 NOTE — ED Notes (Signed)
 Pt in bed pt moving all extremities, resps even and unlabored, pt awake and oriented.

## 2024-04-16 NOTE — Progress Notes (Signed)
 Echocardiogram 2D Echocardiogram has been performed.  Merlynn Argyle 04/16/2024, 8:52 AM

## 2024-04-16 NOTE — Consult Note (Addendum)
 Hospital Consult    Reason for Consult:  bilateral carotid artery stenosis Requesting Physician:  Patria MRN #:  [id number]  History of Present Illness: This is a 78 y.o. male who presented to the hospital after he fell and was found about an hour later by a family member.  He was later noticed to have right leg weakness and was flaccid but has improved.  Pt is left hand dominant.    He states that he was at home yesterday and fell after his right leg became weak.  He denies any claudication, rest pain or non healing wounds.  He states that over the past couple of weeks, on 2-3 occasions, he knew what he wanted to say but he could not find the words.  He denies any amaurosis fugax or other extremity weakness.  He feels like is close to being back to normal.   He states he has not had these issues prior to this event.    MRI in ED was concerning for punctate stroke.  His labs revealed thrombocytopenia with platelet count of 45k this morning and leukopenia of 1.8.    He has hx of CAD with hx of MI in 2005 and CABG x 3 in 2009, HTN, HLD, DJD, DM.    Pt was seen by hematology/oncology 04/01/2024.  Differential diagnosis considered included vitamin deficiencies, thyroid  dysfunction, liver cirrhosis, and myelodysplastic syndrome (MDS). Considering his age, MDS is a possibility. It was recommended to proceed with bone marrow bx/aspiration and was referred to IR.  He has had significant weight loss per his PCP in early November.   The pt is on a statin for cholesterol management.  The pt is on a daily aspirin .   Other AC:  Plavix  The pt is not currently on medication for hypertension.   The pt is  on medication for diabetes PTA. Tobacco hx:  former  PTA, he was on CCB, BB.    Past Medical History:  Diagnosis Date   CAD (coronary artery disease)    a. s/p MI in 2005 (symptom of indigestion); b. CABG x 3 in 3/09: L-LAD, S-OM, EF unknown   Colon polyps    Diabetes mellitus    DJD (degenerative  joint disease)    HLD (hyperlipidemia)    HTN (hypertension)    MI (myocardial infarction) (HCC) 2005   manifested by indigestion   Murmur    echo 6/12:  Upper septal thickening, no LVOT gradient,, EF 65%, mild LAE      Past Surgical History:  Procedure Laterality Date   CHOLECYSTECTOMY  2002   CORONARY ARTERY BYPASS GRAFT  2009   x3   CORONARY/GRAFT ACUTE MI REVASCULARIZATION N/A 07/25/2020   Procedure: Coronary/Graft Acute MI Revascularization;  Surgeon: Jordan, Peter M, MD;  Location: [facility] INVASIVE CV LAB;  Service: Cardiovascular;  Laterality: N/A;   LEFT HEART CATH AND CORS/GRAFTS ANGIOGRAPHY N/A 07/25/2020   Procedure: LEFT HEART CATH AND CORS/GRAFTS ANGIOGRAPHY;  Surgeon: Jordan, Peter M, MD;  Location: [facility] INVASIVE CV LAB;  Service: Cardiovascular;  Laterality: N/A;   TONSILLECTOMY      No Known Allergies  Prior to Admission medications   Medication Sig Start Date End Date Taking? Authorizing Provider  amLODipine  (NORVASC ) 10 MG tablet Take 0.5 tablets (5 mg total) by mouth daily. 03/12/24  Yes [name], [name], MD  aspirin  81 MG tablet Take 81 mg by mouth daily.   Yes [provider]  atorvastatin  (LIPITOR ) 80 MG tablet Take 1 tablet (80  mg total) by mouth daily at 6 PM. 03/12/24  Yes [name], Quintin PARAS, MD  carvedilol  (COREG ) 12.5 MG tablet Take 1 tablet (12.5 mg total) by mouth 2 (two) times daily with a meal. 03/12/24  Yes [name], Quintin PARAS, MD  clopidogrel  (PLAVIX ) 75 MG tablet Take 1 tablet (75 mg total) by mouth daily. 03/12/24  Yes [name], [name], MD  FLUoxetine  (PROZAC ) 20 MG capsule Take 1 capsule (20 mg total) by mouth daily. 03/12/24  Yes [name], [name], MD  metFORMIN  (GLUCOPHAGE -XR) 500 MG 24 hr tablet Take 1 tablet (500 mg total) by mouth daily. 03/12/24  Yes [name], [name], MD  nitroGLYCERIN  (NITROSTAT ) 0.4 MG SL tablet Place 1 tablet (0.4 mg total) under the tongue every 5 (five) minutes as needed for chest pain. 03/12/24 01/16/30 Yes [name],  [name], MD  traZODone (DESYREL) 50 MG tablet Take 50 mg by mouth at bedtime as needed. 09/28/23  Yes [provider]  meclizine  (ANTIVERT ) 25 MG tablet Take 1 tablet (25 mg total) by mouth 3 (three) times daily as needed. Patient not taking: Reported on 04/15/2024 03/12/24   [name], Quintin PARAS, MD    Social History   Socioeconomic History   Marital status: Legally Separated    Spouse name: Paulette   Number of children: 2   Years of education: 12   Highest education level: Not on file  Occupational History   Occupation: Retired  Tobacco Use   Smoking status: Former    Current packs/day: 0.20    Average packs/day: 0.2 packs/day for 40.0 years (8.0 ttl pk-yrs)    Types: Cigarettes    Passive exposure: Current   Smokeless tobacco: Never   Tobacco comments:    quit march 2010 after 20 yrs  Vaping Use   Vaping status: Never Used  Substance and Sexual Activity   Alcohol use: No   Drug use: No   Sexual activity: Not on file  Other Topics Concern   Not on file  Social History Narrative   Lives with wife Paulette    Caffeine use: Coffee- 2 mugs per day   Left handed   Social Drivers of Health   Financial Resource Strain: Low Risk  (03/12/2024)   Overall Financial Resource Strain (CARDIA)    Difficulty of Paying Living Expenses: Not hard at all  Food Insecurity: Food Insecurity Present (03/17/2024)   Hunger Vital Sign    Worried About Running Out of Food in the Last Year: Sometimes true    Ran Out of Food in the Last Year: Never true  Transportation Needs: No Transportation Needs (03/12/2024)   PRAPARE - Administrator, Civil Service (Medical): No    Lack of Transportation (Non-Medical): No  Physical Activity: Inactive (03/12/2024)   Exercise Vital Sign    Days of Exercise per Week: 0 days    Minutes of Exercise per Session: 0 min  Stress: No Stress Concern Present (03/12/2024)   [employer] of Occupational Health - Occupational Stress Questionnaire     Feeling of Stress: Not at all  Social Connections: Unknown (03/12/2024)   Social Connection and Isolation Panel    Frequency of Communication with Friends and Family: More than three times a week    Frequency of Social Gatherings with Friends and Family: More than three times a week    Attends Religious Services: More than 4 times per year    Active Member of Clubs or Organizations: No  Attends Banker Meetings: Never    Marital Status: Patient unable to answer  Intimate Partner Violence: Not At Risk (03/12/2024)   Humiliation, Afraid, Rape, and Kick questionnaire    Fear of Current or Ex-Partner: No    Emotionally Abused: No    Physically Abused: No    Sexually Abused: No    Family History  Problem Relation Age of Onset   Colon cancer Mother    Heart attack Mother    High blood pressure Father    Diabetes Father     ROS: [x]  Positive   [ ]  Negative   [ ]  All sytems reviewed and are negative  Cardiac: [x]  CAD with hx of CABG   Vascular: []  pain in legs while walking []  pain in legs at rest []  pain in legs at night []  non-healing ulcers []  hx of DVT []  swelling in legs  Pulmonary: []  asthma/wheezing []  home O2  Neurologic: [x]  hx of CVA    Hematologic: []  hx of cancer [x]  decreased WBC and platelets  Endocrine:   [x]  diabetes []  thyroid  disease  GI []  GERD  GU: []  CKD/renal failure []  HD--[]  M/W/F or []  T/T/S  Psychiatric: []  anxiety []  depression  Musculoskeletal: [x]  DJD  Integumentary: []  rashes []  ulcers  Constitutional: []  fever  []  chills  Physical Examination  Vitals:   04/16/24 1110 04/16/24 1153  BP: 116/62 110/69  Pulse: (!) 50 68  Resp: (!) 21 19  Temp: 98.3 F (36.8 C) 98.2 F (36.8 C)  SpO2: 100% 100%   There is no height or weight on file to calculate BMI.  General:  WDWN in NAD Gait: Not observed HENT: WNL, normocephalic Pulmonary: normal non-labored breathing Cardiac: regular Abdomen:  soft,  NT; aortic pulse is not palpable Skin: without rashes Vascular Exam/Pulses: Palpable radial pulses bilaterally Pedal pulses were not palpable Musculoskeletal: no muscle wasting or atrophy  Neurologic: A&O X 3; RLE very slightly weaker than the left.  Otherwise moving all extremities equally.  Speech is clear.  Psychiatric:  The pt has Normal affect.   CBC    Component Value Date/Time   WBC 1.8 (L) 04/16/2024 0449   RBC 3.05 (L) 04/16/2024 0449   HGB 9.3 (L) 04/16/2024 0449   HGB 11.2 (L) 03/17/2024 1215   HGB 10.8 (L) 03/12/2024 1459   HCT 27.7 (L) 04/16/2024 0449   HCT 32.5 (L) 03/12/2024 1459   PLT 45 (L) 04/16/2024 0449   PLT 58 (L) 03/17/2024 1215   PLT 63 (LL) 03/12/2024 1459   MCV 90.8 04/16/2024 0449   MCV 90 03/12/2024 1459   MCH 30.5 04/16/2024 0449   MCHC 33.6 04/16/2024 0449   RDW 19.5 (H) 04/16/2024 0449   RDW 18.5 (H) 03/12/2024 1459   LYMPHSABS 0.9 04/15/2024 1558   LYMPHSABS 1.1 03/12/2024 1459   MONOABS 0.1 04/15/2024 1558   EOSABS 0.0 04/15/2024 1558   EOSABS 0.0 03/12/2024 1459   BASOSABS 0.0 04/15/2024 1558   BASOSABS 0.0 03/12/2024 1459    BMET    Component Value Date/Time   NA 138 04/16/2024 0449   NA 141 03/12/2024 1459   K 4.4 04/16/2024 0449   CL 111 04/16/2024 0449   CO2 21 (L) 04/16/2024 0449   GLUCOSE 137 (H) 04/16/2024 0449   BUN 14 04/16/2024 0449   BUN 19 03/12/2024 1459   CREATININE 1.27 (H) 04/16/2024 0449   CREATININE 1.07 03/17/2024 1215   CALCIUM  8.1 (L) 04/16/2024 0449   GFRNONAA 57 (  L) 04/16/2024 0449   GFRNONAA >60 03/17/2024 1215   GFRAA >60 08/31/2016 0443    COAGS: Lab Results  Component Value Date   INR 1.3 (H) 04/15/2024   INR 1.1 07/25/2020   INR 1.1 (H) 10/25/2010     Non-Invasive Vascular Imaging:   CTA head/neck 04/16/2024 IMPRESSION: 1. 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 2. Extensive calcific plaque within the proximal left  internal carotid artery with estimated stenosis approximately 60 to 70%. 3. Moderate-to-severe stenosis of the P2 segment of the left posterior cerebral artery and moderate stenosis of the P2 segment of the right posterior cerebral artery. 4. Moderate stenosis of the cavernous and supraclinoid segments, approximately 40 to 50% bilaterally  -Carotid duplex ordered today.   ASSESSMENT/PLAN: This is a 78 y.o. male admitted with stroke with RLE weakness and a couple of episodes of expressive aphasia over the past couple of weeks.  Workup in ER included findings of bilateral carotid artery stenosis with right ICA nearly occluded and left ICA stenosis of 60-70%.     -CTA with bilateral carotid artery stenosis with right > left.  Carotid duplex ordered as well.  Pt is on asa/plavix /statin.   -Dr. Gretta to evaluate pt and review CT scan and carotid duplex and determine further plan.  Pt adamant that he would like for MD to also discuss with his wife.  She will be here a little later today.   -continue asa/statin/plavix  -hem/onc has also been consulted for pancytopenia.   Lucie Apt, PA-C [facility] [phone]  I have seen and evaluated the patient. I agree with the PA note as documented above.  78 year old male that vascular surgery has been consulted for symptomatic carotid stenosis.  Patient states he had a fall at home with some right leg weakness.  MRI subsequently showed bilateral frontal strokes.  CTA neck with over 90% right ICA stenosis and 60 to 70% left ICA stenosis.  I certainly agree the right carotid is much more diseased.  That being said with his right leg weakness neurology feels the left carotid should be revascularized first.  Tentative plan for left TCAR likely early next week.  Need to remain on aspirin  Plavix  statin.  Will need to watch his pancytopenia particularly his platelets prior to surgery.  Discussed goal of surgery stroke risk reduction.  Procedure  does carry 1% stroke risk.  Lonni DOROTHA Gretta, MD [facility] [place] Office: [phone]

## 2024-04-16 NOTE — ED Notes (Signed)
 Pt in bed, pt voided, cleaned pt and changed bedding, pt disoriented to month and age, re oriented pt.  Pt has some weakness on the R and decreased sensation in R leg, md notified of changed to NIHSS, pt denies pain, echo at bedside.

## 2024-04-16 NOTE — ED Notes (Signed)
 Pt in bed, echo at bedside, pt is put flat per neuro and md instructions

## 2024-04-16 NOTE — Progress Notes (Addendum)
 STROKE TEAM PROGRESS NOTE   SUBJECTIVE (INTERVAL HISTORY)  Pt reports that yesterday was the first time he has had an episode like this. Reports that his low WBC and Hgb is chronic.   OBJECTIVE Temp:  [97.9 F (36.6 C)-98.8 F (37.1 C)] 98.3 F (36.8 C) (12/10 1110) Pulse Rate:  [36-136] 50 (12/10 1110) Cardiac Rhythm: Normal sinus rhythm (12/09 1515) Resp:  [13-29] 21 (12/10 1110) BP: (116-166)/(62-115) 116/62 (12/10 1110) SpO2:  [96 %-100 %] 100 % (12/10 1110)  Recent Labs  Lab 04/15/24 1559 04/16/24 0742 04/16/24 1144  GLUCAP 111* 139* 123*   Recent Labs  Lab 04/15/24 1558 04/16/24 0449  NA 141 138  K 4.0 4.4  CL 112* 111  CO2 21* 21*  GLUCOSE 126* 137*  BUN 14 14  CREATININE 1.13 1.27*  CALCIUM  9.0 8.1*   Recent Labs  Lab 04/15/24 1558 04/16/24 0449  AST 23 35  ALT 11 16  ALKPHOS 104 98  BILITOT 1.9* 2.4*  PROT 7.3 6.4*  ALBUMIN 3.2* 2.9*   Recent Labs  Lab 04/15/24 1558 04/16/24 0449  WBC 2.1* 1.8*  NEUTROABS 1.0*  --   HGB 10.3* 9.3*  HCT 32.1* 27.7*  MCV 95.0 90.8  PLT 52* 45*   Recent Labs  Lab 04/15/24 1558  CKTOTAL 131   Recent Labs    04/15/24 1558  LABPROT 16.8*  INR 1.3*   No results for input(s): COLORURINE, LABSPEC, PHURINE, GLUCOSEU, HGBUR, BILIRUBINUR, KETONESUR, PROTEINUR, UROBILINOGEN, NITRITE, LEUKOCYTESUR in the last 72 hours.  Invalid input(s): APPERANCEUR     Component Value Date/Time   CHOL 125 04/16/2024 0449   TRIG 44 04/16/2024 0449   HDL 38 (L) 04/16/2024 0449   CHOLHDL 3.3 04/16/2024 0449   VLDL 9 04/16/2024 0449   LDLCALC 78 04/16/2024 0449   Lab Results  Component Value Date   HGBA1C 5.9 (H) 03/12/2024      Component Value Date/Time   LABOPIA NONE DETECTED 04/15/2024 1611   COCAINSCRNUR NONE DETECTED 04/15/2024 1611   LABBENZ NONE DETECTED 04/15/2024 1611   AMPHETMU NONE DETECTED 04/15/2024 1611   THCU NONE DETECTED 04/15/2024 1611   LABBARB NONE DETECTED 04/15/2024 1611     Recent Labs  Lab 04/15/24 1558  ETH <15    I have personally reviewed the radiological images below and agree with the radiology interpretations.  ECHOCARDIOGRAM COMPLETE Result Date: 04/16/2024    ECHOCARDIOGRAM REPORT   Patient Name:   ZEN CEDILLOS Date of Exam: 04/16/2024 Medical Rec #:  [id number]      Height:       69.0 in Accession #:    [id number]     Weight:       172.4 lb Date of Birth:  20-Oct-1945      BSA:          1.940 m Patient Age:    78 years       BP:           138/67 mmHg Patient Gender: M              HR:           74 bpm. Exam Location:  Inpatient Procedure: 2D Echo, Cardiac Doppler and Color Doppler (Both Spectral and Color            Flow Doppler were utilized during procedure). Indications:    Stroke 163.9  History:        Patient has prior history of Echocardiogram examinations, most  recent 07/26/2020. CAD and Previous Myocardial Infarction,                 Stroke, Signs/Symptoms:Chest Pain and Shortness of Breath; Risk                 Factors:Dyslipidemia, Hypertension, Diabetes and Former Smoker.  Sonographer:    Merlynn Argyle Referring Phys: [phone] REDIA LOISE CLEAVER  Sonographer Comments: Image acquisition challenging due to respiratory motion. IMPRESSIONS  1. Left ventricular ejection fraction, by estimation, is 60 to 65%. The left ventricle has normal function. The left ventricle has no regional wall motion abnormalities. There is mild asymmetric left ventricular hypertrophy of the basal-septal segment. Left ventricular diastolic parameters are consistent with Grade I diastolic dysfunction (impaired relaxation).  2. Right ventricular systolic function is normal. The right ventricular size is mildly enlarged. There is normal pulmonary artery systolic pressure. The estimated right ventricular systolic pressure is 16.1 mmHg.  3. Left atrial size was moderately dilated.  4. The mitral valve is degenerative. Mild mitral valve regurgitation. No evidence of mitral  stenosis. Moderate mitral annular calcification.  5. The aortic valve is tricuspid. There is mild calcification of the aortic valve. There is mild thickening of the aortic valve. Aortic valve regurgitation is trivial. Aortic valve sclerosis is present, with no evidence of aortic valve stenosis.  6. The inferior vena cava is normal in size with greater than 50% respiratory variability, suggesting right atrial pressure of 3 mmHg. Conclusion(s)/Recommendation(s): No intracardiac source of embolism detected on this transthoracic study. Consider a transesophageal echocardiogram to exclude cardiac source of embolism if clinically indicated. FINDINGS  Left Ventricle: Left ventricular ejection fraction, by estimation, is 60 to 65%. The left ventricle has normal function. The left ventricle has no regional wall motion abnormalities. The left ventricular internal cavity size was normal in size. There is  mild asymmetric left ventricular hypertrophy of the basal-septal segment. Left ventricular diastolic parameters are consistent with Grade I diastolic dysfunction (impaired relaxation). Right Ventricle: The right ventricular size is mildly enlarged. No increase in right ventricular wall thickness. Right ventricular systolic function is normal. There is normal pulmonary artery systolic pressure. The tricuspid regurgitant velocity is 1.81  m/s, and with an assumed right atrial pressure of 3 mmHg, the estimated right ventricular systolic pressure is 16.1 mmHg. Left Atrium: Left atrial size was moderately dilated. Right Atrium: Right atrial size was normal in size. Pericardium: There is no evidence of pericardial effusion. Mitral Valve: The mitral valve is degenerative in appearance. There is mild thickening of the mitral valve leaflet(s). There is mild calcification of the mitral valve leaflet(s). Moderate mitral annular calcification. Mild mitral valve regurgitation. No evidence of mitral valve stenosis. Tricuspid Valve: The  tricuspid valve is normal in structure. Tricuspid valve regurgitation is trivial. No evidence of tricuspid stenosis. Aortic Valve: The aortic valve is tricuspid. There is mild calcification of the aortic valve. There is mild thickening of the aortic valve. Aortic valve regurgitation is trivial. Aortic valve sclerosis is present, with no evidence of aortic valve stenosis. Pulmonic Valve: The pulmonic valve was normal in structure. Pulmonic valve regurgitation is trivial. No evidence of pulmonic stenosis. Aorta: The aortic root is normal in size and structure. Venous: The inferior vena cava is normal in size with greater than 50% respiratory variability, suggesting right atrial pressure of 3 mmHg. IAS/Shunts: No atrial level shunt detected by color flow Doppler.  LEFT VENTRICLE PLAX 2D LVIDd:         4.60 cm  Diastology LVIDs:         3.40 cm   LV e' medial:    4.66 cm/s LV PW:         1.00 cm   LV E/e' medial:  19.7 LV IVS:        1.30 cm   LV e' lateral:   15.50 cm/s LVOT diam:     2.20 cm   LV E/e' lateral: 5.9 LV SV:         108 LV SV Index:   56 LVOT Area:     3.80 cm  RIGHT VENTRICLE RV Basal diam:  4.30 cm RV Mid diam:    3.50 cm RV S prime:     21.80 cm/s TAPSE (M-mode): 1.5 cm LEFT ATRIUM             Index        RIGHT ATRIUM           Index LA diam:        4.00 cm 2.06 cm/m   RA Area:     19.00 cm LA Vol (A2C):   71.7 ml 36.97 ml/m  RA Volume:   51.60 ml  26.60 ml/m LA Vol (A4C):   96.7 ml 49.85 ml/m LA Biplane Vol: 87.0 ml 44.85 ml/m  AORTIC VALVE LVOT Vmax:   127.00 cm/s LVOT Vmean:  94.800 cm/s LVOT VTI:    0.285 m  AORTA Ao Root diam: 3.70 cm Ao Asc diam:  3.70 cm MITRAL VALVE               TRICUSPID VALVE MV Area (PHT): 2.55 cm    TR Peak grad:   13.1 mmHg MV Decel Time: 297 msec    TR Vmax:        181.00 cm/s MV E velocity: 92.00 cm/s MV A velocity: 90.30 cm/s  SHUNTS MV E/A ratio:  1.02        Systemic VTI:  0.29 m                            Systemic Diam: 2.20 cm Oneil Parchment MD  Electronically signed by Oneil Parchment MD Signature Date/Time: 04/16/2024/11:50:06 AM    Final    CT ANGIO HEAD NECK W WO CM Result Date: 04/16/2024 EXAM: CTA HEAD AND NECK WITHOUT AND WITH 04/16/2024 05:41:14 AM TECHNIQUE: CTA of the head and neck was performed without and with the administration of 75 mL of iohexol  (OMNIPAQUE ) 350 MG/ML injection. Multiplanar 2D and/or 3D reformatted images are provided for review. Automated exposure control, iterative reconstruction, and/or weight based adjustment of the mA/kV was utilized to reduce the radiation dose to as low as reasonably achievable. Stenosis of the internal carotid arteries measured using NASCET criteria. COMPARISON: CT of the head dated 04/15/2024 CLINICAL HISTORY: Stroke/TIA, determine embolic source. FINDINGS: CTA NECK: AORTIC ARCH AND ARCH VESSELS: No dissection or arterial injury. No significant stenosis of the brachiocephalic or subclavian arteries. There is calcific atheromatous disease within the aortic arch. CERVICAL CAROTID ARTERIES: No dissection or arterial injury. There is moderate calcific plaque within the carotid bulbs and origins of the internal carotid arteries bilaterally. There is near occlusive stenosis of the proximal right internal carotid artery with estimated stenosis 90% or greater. There is extensive calcific plaque within the proximal left internal carotid artery with estimated stenosis approximately 60 to 70%. CERVICAL VERTEBRAL ARTERIES: No dissection, arterial injury, or significant stenosis. LUNGS AND MEDIASTINUM: Unremarkable. SOFT  TISSUES: No acute abnormality. BONES: No acute abnormality. CTA HEAD: ANTERIOR CIRCULATION: There is moderately advanced calcific atheromatous disease within the carotid siphons. There is moderate stenosis of the cavernous and supraclinoid segments, approximately 40 to 50% bilaterally. No significant stenosis of the anterior cerebral arteries. No significant stenosis of the middle cerebral  arteries. No aneurysm. POSTERIOR CIRCULATION: There is moderate-to-severe stenosis of the P2 segment of the left posterior cerebral artery and there is moderate stenosis of the P2 segment of the right posterior cerebral artery. The focal stenoses are demonstrated on images 104 and 106 of series 9, respectively. No significant stenosis of the basilar artery. There is moderately advanced calcific atheromatous disease within the vertebral arteries. No aneurysm. OTHER: There is age-related atrophy and moderately advanced cerebral white matter disease. Chronic encephalomalacia changes are noted within the frontal and parietal lobes bilaterally from remote cortical infarcts. Chronic infarcts are also noted within the cerebellar hemispheres bilaterally. No dural venous sinus thrombosis on this non-dedicated study. IMPRESSION: 1. 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 2. Extensive calcific plaque within the proximal left internal carotid artery with estimated stenosis approximately 60 to 70%. 3. Moderate-to-severe stenosis of the P2 segment of the left posterior cerebral artery and moderate stenosis of the P2 segment of the right posterior cerebral artery. 4. Moderate stenosis of the cavernous and supraclinoid segments, approximately 40 to 50% bilaterally. Electronically signed by: Evalene Coho MD 04/16/2024 06:14 AM EST RP Workstation: [id number]   MR BRAIN WO CONTRAST Result Date: 04/15/2024 EXAM: MRI BRAIN WITHOUT CONTRAST 04/15/2024 07:46:35 PM TECHNIQUE: Multiplanar multisequence MRI of the head/brain was performed without the administration of intravenous contrast. COMPARISON: CT head from earlier today. MRI head 08/30/2016. CLINICAL HISTORY: Neuro deficit, acute, stroke suspected FINDINGS: BRAIN AND VENTRICLES: Punctate acute infarcts in the high right frontal and left parasagittal frontal lobes (series 2, images 39 and 37). No intracranial  hemorrhage. No mass. No midline shift. No hydrocephalus. Advanced T2 hyperintensity in the white matter, compatible with chronic avascular ischemic disease. Remote lacunar infarcts in the corona radiata and bilateral parietal cortex. Multiple remote cerebellar infarcts bilaterally. Normal flow voids. ORBITS: No acute abnormality. SINUSES AND MASTOIDS: No acute abnormality. BONES AND SOFT TISSUES: Normal marrow signal. No acute soft tissue abnormality. IMPRESSION: 1. Punctate acute infarcts in the high right frontal and left parasagittal frontal lobes. 2. 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 Electronically signed by: Gilmore Molt MD 04/15/2024 09:43 PM EST RP Workstation: [id number]   CT HEAD WO CONTRAST Result Date: 04/15/2024 CLINICAL DATA:  Clemens out of bed, neurologic deficit EXAM: CT HEAD WITHOUT CONTRAST TECHNIQUE: Contiguous axial images were obtained from the base of the skull through the vertex without intravenous contrast. RADIATION DOSE REDUCTION: This exam was performed according to the departmental dose-optimization program which includes automated exposure control, adjustment of the mA and/or kV according to patient size and/or use of iterative reconstruction technique. COMPARISON:  08/31/2016 FINDINGS: Brain: There are chronic cortical infarcts along the bilateral frontal and parietal convexities, as well as within the bilateral cerebellar hemispheres. Chronic small vessel ischemic changes are seen throughout the periventricular and subcortical white matter. No evidence of acute infarct or hemorrhage. Dense bilateral basal ganglia calcifications are again noted. Lateral ventricles and midline structures are otherwise unremarkable. No acute extra-axial fluid collections. No mass effect. Vascular: No hyperdense vessel or unexpected calcification. Skull: Normal. Negative for fracture or focal lesion. Sinuses/Orbits: No acute finding. Other: None.  IMPRESSION: 1. No acute intracranial process. 2. Chronic ischemic changes as above. Electronically Signed   By: Ozell Daring M.D.   On: 04/15/2024 17:50     PHYSICAL EXAM  Temp:  [97.9 F (36.6 C)-98.8 F (37.1 C)] 98.3 F (36.8 C) (12/10 1110) Pulse Rate:  [36-136] 50 (12/10 1110) Resp:  [13-29] 21 (12/10 1110) BP: (116-166)/(62-115) 116/62 (12/10 1110) SpO2:  [96 %-100 %] 100 % (12/10 1110)  General - Well nourished, well developed, in no apparent distress.  Mental Status -  Level of arousal and orientation to time, place, and person were intact. Language including expression, naming, repetition, comprehension was assessed and found intact. Attention span and concentration were normal. Recent and remote memory were intact. Fund of Knowledge was assessed and was intact.  Cranial Nerves II - XII - II - Visual field intact OU. III, IV, VI - Extraocular movements intact. V - Facial sensation intact bilaterally. VII - Facial movement intact bilaterally. VIII - Hearing & vestibular intact bilaterally. X - Palate elevates symmetrically. XI - Chin turning & shoulder shrug intact bilaterally. XII - Tongue protrusion intact.  Motor Strength - L arm and leg 5/5. R arm 5/5. R hip flexion 3-/5, knee extension 3/5, dorsi and plantar flexion 4/5. Pronator drift was absent.  Bulk was normal and fasciculations were absent  Sensory - Light touch were assessed and were symmetrical.    Coordination - The patient had normal movements in the hands and feet with no ataxia or dysmetria.  Tremor was absent.  Gait and Station - deferred.   ASSESSMENT/PLAN Mr. ABELINO TIPPIN is a 78 y.o. male with history of CAD s/p CABG, DM, DJD, HLD, HTN admitted for Acute R sided weakness. Imaging shows R ICA >90% stenosis, L ICA 60-70% stenosis, with likely L ICA related symptoms. Need to keep him from hypotension, and have called [facility] to see if they want to intervene.    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 2D Echo: EF 60-65%, LA moderately dilated, RA normal in size LDL 78 HgbA1c 5.9 UDS negative aspirin  81 mg daily and clopidogrel  75 mg daily prior to admission, now on aspirin  81 mg daily and clopidogrel  75 mg daily.  Further regimen per [facility] Patient counseled to be compliant with his antithrombotic medications Ongoing aggressive stroke risk factor management Therapy recommendations: Pending Disposition: TBD  Fluctuating right-sided weakness Reported by EMS patient was flaccid on right side, improved in the ER.  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 Gaylene assistance Plan for TCAR next week  Pancytopenia WBC 2.1--1.8 Hemoglobin 10.3-9.3 Platelet 52-45 Initial plan for bone marrow biopsy next week to rule out MDS Oncology consulted  Hypertension Home meds including Coreg  BP 120s now Hold off Coreg  for now Avoid low BP BP goal 140-160 before carotid revascularization  Hyperlipidemia Home meds:  Lipitor   80mg  daily LDL 78, goal < 70 Now on Lipitor  80mg  daily Continue statin at discharge  Other Stroke Risk Factors Advanced age Coronary artery disease supposed CABG  Other medical issues AKI on CKD, creatinine 1.13-1.27 Depression on Prozac   Hospital day # 0  D. Penne Mori, DO,  PGY1 Neurology Stroke Team 04/16/2024 11:51 AM  ATTENDING NOTE: I reviewed above note and agree with the assessment and plan. Pt was seen and examined.   No family at bedside.  Patient lying bed, head of bed flat due to earlier episode of worsening right-sided weakness.  Currently AO x 3, still has right leg 3/5 weakness, bilateral upper extremity equal strength.  Decreased right lower extremity sensation.  Patient symptoms likely due to bilateral frontal infarcts from severe stenosis bilateral ICA.  Vascular surgery consulted, plan for TCAR next week.  However patient does have pancytopenia with thrombocytopenia, oncology also consulted.  Continue home DAPT and statin.  Hold off Coreg  BP meds at this time given soft BP.  Avoid low BP, BP goal 140-160 if able before carotid intervention.  Holding PT for today.  Will follow.  For detailed assessment and plan, please refer to above as I have made changes wherever appropriate.   Ary Cummins, MD PhD Stroke Neurology 04/16/2024 7:18 PM  Patient has developed fluctuating right-sided weakness. I discussed with Dr. Gretta and Leotis. I spent extensive total face-to-face time with the patient, reviewing test results, images and medication, and discussing the diagnosis, treatment plan and potential prognosis. This patient's care requiresreview of multiple databases, neurological assessment, discussion with other specialists and medical decision making of high complexity.      To contact Stroke Continuity provider, please refer to [email or web address]. After hours, contact General Neurology

## 2024-04-16 NOTE — Progress Notes (Addendum)
 ADDENDUM:  Patient was personally and independently interviewed, examined and relevant elements of the history of present illness were reviewed in details and an assessment and plan was created. All elements of the patient's history of present illness, assessment and plan were discussed in detail with Olam JINNY Brunner, NP. The above documentation reflects our combined findings assessment and plan.   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  Hematological workup was grossly negative and we recommended bone marrow biopsy/aspiration, since MDS was a possibility.  This is currently scheduled as an outpatient on 05/02/2024.  Patient got hospitalized on 04/15/2024 after he presented with right lower extremity weakness and dizziness.  Clinical picture consistent with acute CVA, bilateral carotid artery stenosis.  We were consulted for recommendations regarding pancytopenia.  When deemed safe, it is advisable to proceed with bone marrow biopsy/aspiration by IR for further evaluation of pancytopenia.  Transfuse as needed to maintain hemoglobin above 7 and platelet count above 20,000.  Goal platelet count above 50,000 in case of any active bleeding or interventional procedures.  Currently no indication for transfusions.  MDS remains a possibility based on clinical picture.  No additional recommendations from our standpoint at this time.  Please call us  with any questions.  Thank you, Chinita Patten, MD   HEMATOLOGY/ ONCOLOGY PROGRESS NOTE:   Christopher Roberts   DOB:1946-04-16   FM#:[id number]      ASSESSMENT & PLAN:  Christopher Roberts is a 78 year old male patient who came to the hospital on 04/15/2024 complaining of right lower extremity weakness and dizziness.  Hematologic history significant for  pancytopenia.  Hematology following closely.  Pancytopenia: - Unclear etiology - Last seen in outpatient hematology office 04/01/2024 for pancytopenia.  Several differential diagnoses considered at that time including MDS.  Therefore bone marrow biopsy was ordered and scheduled for 05/02/2024. - Consideration for bone marrow biopsy during this admission -Continue to monitor CBC with differential  Leukopenia - WBC 1.8. - No intervention required at this time Anemia - Hemoglobin 9.3. - Transfuse PRBC for hemoglobin <7.0 - No transfusional intervention required at this time. Thrombocytopenia - Platelets low 45K - Transfuse platelets for platelet counts <20 K or <50 K with active bleeding - No active bleeding is noted at this time - Hematology/Dr. Andree Heeg following closely and will make further recommendations.  Right lower extremity weakness Dizziness Acute CVA Bilateral carotid artery stenosis - Patient admits to ongoing lower extremity weakness.  Denies chest pain. - Imaging showed bilateral ICA stenosis, right worse than left. - Vascular team following. - Continue supportive care  Hyperbilirubinemia Renal dysfunction - Continue gentle IV hydration - Avoid nephrotoxic agents - Avoid hepatotoxic agents    Code Status Full  Subjective:  Patient seen awake and alert laying in bed in the ED.  Spouse at bedside.  Reports progressive lower extremity weakness and dizziness which necessitated further ED evaluation.  No other complaints offered.  Objective:  No intake or output data in the 24 hours ending 04/16/24 1300   PHYSICAL EXAMINATION: ECOG PERFORMANCE STATUS: 3 - Symptomatic, >50% confined to bed  Vitals:   04/16/24 1110 04/16/24 1153  BP: 116/62 110/69  Pulse: (!) 50 68  Resp: (!) 21 19  Temp: 98.3 F (36.8 C) 98.2 F (36.8 C)  SpO2: 100% 100%   There were  no vitals filed for this visit.  GENERAL: alert, + elderly appearing SKIN: skin color, texture,  turgor are normal, no rashes or significant lesions EYES: normal, conjunctiva are pink and non-injected, sclera clear OROPHARYNX: no exudate, no erythema and lips, buccal mucosa, and tongue normal  NECK: supple, thyroid  normal size, non-tender, without nodularity LYMPH: no palpable lymphadenopathy in the cervical, axillary or inguinal LUNGS: clear to auscultation and percussion with normal breathing effort HEART: regular rate & rhythm and no murmurs and no lower extremity edema ABDOMEN: abdomen soft, non-tender and normal bowel sounds MUSCULOSKELETAL: no cyanosis of digits and no clubbing  PSYCH: alert & oriented x 3 with fluent speech NEURO: no focal motor/sensory deficits   All questions were answered. The patient knows to call the clinic with any problems, questions or concerns.   The total time spent in the appointment was 40 minutes encounter with patient including review of chart and various tests results, discussions about plan of care and coordination of care plan  Olam JINNY Brunner, NP 04/16/2024 1:00 PM    Labs Reviewed:  Lab Results  Component Value Date   WBC 1.8 (L) 04/16/2024   HGB 9.3 (L) 04/16/2024   HCT 27.7 (L) 04/16/2024   MCV 90.8 04/16/2024   PLT 45 (L) 04/16/2024   Recent Labs    03/17/24 1215 04/15/24 1558 04/16/24 0449  NA 141 141 138  K 3.8 4.0 4.4  CL 107 112* 111  CO2 22 21* 21*  GLUCOSE 127* 126* 137*  BUN 14 14 14   CREATININE 1.07 1.13 1.27*  CALCIUM  9.7 9.0 8.1*  GFRNONAA >60 >60 57*  PROT 8.3* 7.3 6.4*  ALBUMIN 4.3 3.2* 2.9*  AST 23 23 35  ALT 15 11 16   ALKPHOS 113 104 98  BILITOT 1.4* 1.9* 2.4*    Studies Reviewed:   ECHOCARDIOGRAM COMPLETE Result Date: 04/16/2024    ECHOCARDIOGRAM REPORT   Patient Name:   Christopher Roberts Date of Exam: 04/16/2024 Medical Rec #:  [id number]      Height:       69.0 in Accession #:    [id number]     Weight:       172.4 lb Date of Birth:  [birth year]      BSA:          1.940 m Patient Age:    78 years        BP:           138/67 mmHg Patient Gender: M              HR:           74 bpm. Exam Location:  Inpatient Procedure: 2D Echo, Cardiac Doppler and Color Doppler (Both Spectral and Color            Flow Doppler were utilized during procedure). Indications:    Stroke 163.9  History:        Patient has prior history of Echocardiogram examinations, most                 recent 07/26/2020. CAD and Previous Myocardial Infarction,                 Stroke, Signs/Symptoms:Chest Pain and Shortness of Breath; Risk                 Factors:Dyslipidemia, Hypertension, Diabetes and Former Smoker.  Sonographer:    Merlynn Argyle Referring Phys: [phone] REDIA LOISE CLEAVER  Sonographer Comments: Image acquisition challenging due to  respiratory motion. IMPRESSIONS  1. Left ventricular ejection fraction, by estimation, is 60 to 65%. The left ventricle has normal function. The left ventricle has no regional wall motion abnormalities. There is mild asymmetric left ventricular hypertrophy of the basal-septal segment. Left ventricular diastolic parameters are consistent with Grade I diastolic dysfunction (impaired relaxation).  2. Right ventricular systolic function is normal. The right ventricular size is mildly enlarged. There is normal pulmonary artery systolic pressure. The estimated right ventricular systolic pressure is 16.1 mmHg.  3. Left atrial size was moderately dilated.  4. The mitral valve is degenerative. Mild mitral valve regurgitation. No evidence of mitral stenosis. Moderate mitral annular calcification.  5. The aortic valve is tricuspid. There is mild calcification of the aortic valve. There is mild thickening of the aortic valve. Aortic valve regurgitation is trivial. Aortic valve sclerosis is present, with no evidence of aortic valve stenosis.  6. The inferior vena cava is normal in size with greater than 50% respiratory variability, suggesting right atrial pressure of 3 mmHg. Conclusion(s)/Recommendation(s): No intracardiac source  of embolism detected on this transthoracic study. Consider a transesophageal echocardiogram to exclude cardiac source of embolism if clinically indicated. FINDINGS  Left Ventricle: Left ventricular ejection fraction, by estimation, is 60 to 65%. The left ventricle has normal function. The left ventricle has no regional wall motion abnormalities. The left ventricular internal cavity size was normal in size. There is  mild asymmetric left ventricular hypertrophy of the basal-septal segment. Left ventricular diastolic parameters are consistent with Grade I diastolic dysfunction (impaired relaxation). Right Ventricle: The right ventricular size is mildly enlarged. No increase in right ventricular wall thickness. Right ventricular systolic function is normal. There is normal pulmonary artery systolic pressure. The tricuspid regurgitant velocity is 1.81  m/s, and with an assumed right atrial pressure of 3 mmHg, the estimated right ventricular systolic pressure is 16.1 mmHg. Left Atrium: Left atrial size was moderately dilated. Right Atrium: Right atrial size was normal in size. Pericardium: There is no evidence of pericardial effusion. Mitral Valve: The mitral valve is degenerative in appearance. There is mild thickening of the mitral valve leaflet(s). There is mild calcification of the mitral valve leaflet(s). Moderate mitral annular calcification. Mild mitral valve regurgitation. No evidence of mitral valve stenosis. Tricuspid Valve: The tricuspid valve is normal in structure. Tricuspid valve regurgitation is trivial. No evidence of tricuspid stenosis. Aortic Valve: The aortic valve is tricuspid. There is mild calcification of the aortic valve. There is mild thickening of the aortic valve. Aortic valve regurgitation is trivial. Aortic valve sclerosis is present, with no evidence of aortic valve stenosis. Pulmonic Valve: The pulmonic valve was normal in structure. Pulmonic valve regurgitation is trivial. No evidence of  pulmonic stenosis. Aorta: The aortic root is normal in size and structure. Venous: The inferior vena cava is normal in size with greater than 50% respiratory variability, suggesting right atrial pressure of 3 mmHg. IAS/Shunts: No atrial level shunt detected by color flow Doppler.  LEFT VENTRICLE PLAX 2D LVIDd:         4.60 cm   Diastology LVIDs:         3.40 cm   LV e' medial:    4.66 cm/s LV PW:         1.00 cm   LV E/e' medial:  19.7 LV IVS:        1.30 cm   LV e' lateral:   15.50 cm/s LVOT diam:     2.20 cm   LV E/e' lateral:  5.9 LV SV:         108 LV SV Index:   56 LVOT Area:     3.80 cm  RIGHT VENTRICLE RV Basal diam:  4.30 cm RV Mid diam:    3.50 cm RV S prime:     21.80 cm/s TAPSE (M-mode): 1.5 cm LEFT ATRIUM             Index        RIGHT ATRIUM           Index LA diam:        4.00 cm 2.06 cm/m   RA Area:     19.00 cm LA Vol (A2C):   71.7 ml 36.97 ml/m  RA Volume:   51.60 ml  26.60 ml/m LA Vol (A4C):   96.7 ml 49.85 ml/m LA Biplane Vol: 87.0 ml 44.85 ml/m  AORTIC VALVE LVOT Vmax:   127.00 cm/s LVOT Vmean:  94.800 cm/s LVOT VTI:    0.285 m  AORTA Ao Root diam: 3.70 cm Ao Asc diam:  3.70 cm MITRAL VALVE               TRICUSPID VALVE MV Area (PHT): 2.55 cm    TR Peak grad:   13.1 mmHg MV Decel Time: 297 msec    TR Vmax:        181.00 cm/s MV E velocity: 92.00 cm/s MV A velocity: 90.30 cm/s  SHUNTS MV E/A ratio:  1.02        Systemic VTI:  0.29 m                            Systemic Diam: 2.20 cm Christopher Parchment MD Electronically signed by Christopher Parchment MD Signature Date/Time: 04/16/2024/11:50:06 AM    Final    CT ANGIO HEAD NECK W WO CM Result Date: 04/16/2024 EXAM: CTA HEAD AND NECK WITHOUT AND WITH 04/16/2024 05:41:14 AM TECHNIQUE: CTA of the head and neck was performed without and with the administration of 75 mL of iohexol  (OMNIPAQUE ) 350 MG/ML injection. Multiplanar 2D and/or 3D reformatted images are provided for review. Automated exposure control, iterative reconstruction, and/or weight based  adjustment of the mA/kV was utilized to reduce the radiation dose to as low as reasonably achievable. Stenosis of the internal carotid arteries measured using NASCET criteria. COMPARISON: CT of the head dated 04/15/2024 CLINICAL HISTORY: Stroke/TIA, determine embolic source. FINDINGS: CTA NECK: AORTIC ARCH AND ARCH VESSELS: No dissection or arterial injury. No significant stenosis of the brachiocephalic or subclavian arteries. There is calcific atheromatous disease within the aortic arch. CERVICAL CAROTID ARTERIES: No dissection or arterial injury. There is moderate calcific plaque within the carotid bulbs and origins of the internal carotid arteries bilaterally. There is near occlusive stenosis of the proximal right internal carotid artery with estimated stenosis 90% or greater. There is extensive calcific plaque within the proximal left internal carotid artery with estimated stenosis approximately 60 to 70%. CERVICAL VERTEBRAL ARTERIES: No dissection, arterial injury, or significant stenosis. LUNGS AND MEDIASTINUM: Unremarkable. SOFT TISSUES: No acute abnormality. BONES: No acute abnormality. CTA HEAD: ANTERIOR CIRCULATION: There is moderately advanced calcific atheromatous disease within the carotid siphons. There is moderate stenosis of the cavernous and supraclinoid segments, approximately 40 to 50% bilaterally. No significant stenosis of the anterior cerebral arteries. No significant stenosis of the middle cerebral arteries. No aneurysm. POSTERIOR CIRCULATION: There is moderate-to-severe stenosis of the P2 segment of the left posterior cerebral artery and there  is moderate stenosis of the P2 segment of the right posterior cerebral artery. The focal stenoses are demonstrated on images 104 and 106 of series 9, respectively. No significant stenosis of the basilar artery. There is moderately advanced calcific atheromatous disease within the vertebral arteries. No aneurysm. OTHER: There is age-related atrophy and  moderately advanced cerebral white matter disease. Chronic encephalomalacia changes are noted within the frontal and parietal lobes bilaterally from remote cortical infarcts. Chronic infarcts are also noted within the cerebellar hemispheres bilaterally. No dural venous sinus thrombosis on this non-dedicated study. IMPRESSION: 1. 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 2. Extensive calcific plaque within the proximal left internal carotid artery with estimated stenosis approximately 60 to 70%. 3. Moderate-to-severe stenosis of the P2 segment of the left posterior cerebral artery and moderate stenosis of the P2 segment of the right posterior cerebral artery. 4. Moderate stenosis of the cavernous and supraclinoid segments, approximately 40 to 50% bilaterally. Electronically signed by: Evalene Coho MD 04/16/2024 06:14 AM EST RP Workstation: [id number]   MR BRAIN WO CONTRAST Result Date: 04/15/2024 EXAM: MRI BRAIN WITHOUT CONTRAST 04/15/2024 07:46:35 PM TECHNIQUE: Multiplanar multisequence MRI of the head/brain was performed without the administration of intravenous contrast. COMPARISON: CT head from earlier today. MRI head 08/30/2016. CLINICAL HISTORY: Neuro deficit, acute, stroke suspected FINDINGS: BRAIN AND VENTRICLES: Punctate acute infarcts in the high right frontal and left parasagittal frontal lobes (series 2, images 39 and 37). No intracranial hemorrhage. No mass. No midline shift. No hydrocephalus. Advanced T2 hyperintensity in the white matter, compatible with chronic avascular ischemic disease. Remote lacunar infarcts in the corona radiata and bilateral parietal cortex. Multiple remote cerebellar infarcts bilaterally. Normal flow voids. ORBITS: No acute abnormality. SINUSES AND MASTOIDS: No acute abnormality. BONES AND SOFT TISSUES: Normal marrow signal. No acute soft tissue abnormality. IMPRESSION: 1. Punctate acute infarcts in the  high right frontal and left parasagittal frontal lobes. 2. 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 Electronically signed by: Gilmore Molt MD 04/15/2024 09:43 PM EST RP Workstation: [id number]   CT HEAD WO CONTRAST Result Date: 04/15/2024 CLINICAL DATA:  Clemens out of bed, neurologic deficit EXAM: CT HEAD WITHOUT CONTRAST TECHNIQUE: Contiguous axial images were obtained from the base of the skull through the vertex without intravenous contrast. RADIATION DOSE REDUCTION: This exam was performed according to the departmental dose-optimization program which includes automated exposure control, adjustment of the mA and/or kV according to patient size and/or use of iterative reconstruction technique. COMPARISON:  08/31/2016 FINDINGS: Brain: There are chronic cortical infarcts along the bilateral frontal and parietal convexities, as well as within the bilateral cerebellar hemispheres. Chronic small vessel ischemic changes are seen throughout the periventricular and subcortical white matter. No evidence of acute infarct or hemorrhage. Dense bilateral basal ganglia calcifications are again noted. Lateral ventricles and midline structures are otherwise unremarkable. No acute extra-axial fluid collections. No mass effect. Vascular: No hyperdense vessel or unexpected calcification. Skull: Normal. Negative for fracture or focal lesion. Sinuses/Orbits: No acute finding. Other: None. IMPRESSION: 1. No acute intracranial process. 2. Chronic ischemic changes as above. Electronically Signed   By: Ozell Daring M.D.   On: 04/15/2024 17:50

## 2024-04-16 NOTE — Progress Notes (Signed)
 PROGRESS NOTE    Christopher Roberts  FMW:[id number] DOB: 17-Sep-1945 DOA: 04/15/2024 PCP: [name], [name], MD   Brief Narrative:  This 78 yrs old Male with PMH significant for CAD status post PCI in 2022, diabetes mellitus type 2, pancytopenia following up with Dr. Autumn oncologist and being worked up for possible MDS, hypertension who had a fall today morning when he tried to get up from the bed around 10 AM.  Patient lost balance and fell onto the floor and was on the floor for almost an hour when his family member found him.  Later noticed to have right lower extremity weakness and was brought in the ED.  EMS noted that his right leg was flaccid.   MRI brain shows features concerning for acute punctate stroke. Patient's right lower extremity slowly improving and is able to move at this time and his strength is around 3/ 5.  Neurologist was consulted and recommended stroke workup for acute stroke.  Patient was admitted for further evaluation.   Assessment & Plan:   Principal Problem:   Acute CVA (cerebrovascular accident) [facility]) Active Problems:   Hyperlipidemia   Essential hypertension   Coronary artery disease due to lipid rich plaque   Diabetes mellitus (HCC)   Cirrhosis of liver (HCC)  Acute CVA : Patient presented with RLE weakness, MRI shows acute punctate stroke.   Patient was evaluated by Neurology , Appreciate neurology consult and recommendations.   Continue aspirin , Plavix  and statins.   Allow for permissive hypertension for 24 hrs ,  hold antihypertensives.  Frequent Neurochecks.  Patient passed stroke swallow screen.  CTA Head/ Neck showed right ICA stenosis > 90, left ICA stenosis  60 to 70% 2D echo shows LVEF 60 to 65%, LA moderately dilated. LDL 78 Near goal.  Hb A1c 5.9. PT and OT evaluation.  Aggressive risk factor modification. Vascular surgery consulted for bilateral carotid artery stenosis.  Carotid duplex ordered.  Bilateral carotid artery stenosis: Vascular  surgery is consulted.  Carotid duplex ordered.  Essential hypertension: Given acute stroke , will allow for permissive hypertension.   hold antihypertensives.  History of CAD status post PCI: He  denies chest pain.  Continue antiplatelet and statins beta-blockers.  Pancytopenia :  Patient being followed by oncologist,  has plans for bone marrow biopsy next week to rule out MDS. Hematology/ oncology consulted.  History of SVT : He is  being followed by cardiologist.  Diabetes mellitus type 2:  Last hemoglobin A1c was 5.9, He takes metformin  at home,  presently on sliding scale coverage.  Cirrhosis of liver : It was recently noted in oncology notes.  Closely monitor.  Further workup as outpatient.   Note that patient is receiving fluids at this time due to low normal blood pressure.  Depression Continue fluoxetine .   DVT prophylaxis: SCDs Code Status: Full code Family Communication:  No family at bed side. Disposition Plan:  Admitted for CVA , Workup in progress.  Oncology is consulted for pancytopenia Vascular surgery is consulted for bilateral carotid artery stenosis.   Consultants:  Neurology Oncology Vascular surgery  Procedures: CT, MRI  Antimicrobials:  Anti-infectives (From admission, onward)    None      Subjective: Patient was seen and examined at bedside.  Overnight events noted. Patient was sitting comfortably in the bed, denies any pain, stated weakness has improved.  Objective: Vitals:   04/16/24 0720 04/16/24 0803 04/16/24 1110 04/16/24 1153  BP:  127/70 116/62 110/69  Pulse: 80 75 (!)  50 68  Resp: 19 18 (!) 21 19  Temp:  98.8 F (37.1 C) 98.3 F (36.8 C) 98.2 F (36.8 C)  TempSrc:  Oral Oral Oral  SpO2: 100% 100% 100% 100%   No intake or output data in the 24 hours ending 04/16/24 1344 There were no vitals filed for this visit.  Examination:  General exam: Appears calm and comfortable, not in any acute distress. Respiratory system:  Clear to auscultation. Respiratory effort normal. RR 14 Cardiovascular system: S1 & S2 heard, RRR. No JVD, murmurs, rubs, gallops or clicks.  Gastrointestinal system: Abdomen is non distended, soft and non tender.  Normal bowel sounds heard. Central nervous system: Alert and oriented X 3. No focal neurological deficits. Extremities: No edema, no cyanosis, no clubbing. Skin: No rashes, lesions or ulcers Psychiatry: Judgement and insight appear normal. Mood & affect appropriate.     Data Reviewed: I have personally reviewed following labs and imaging studies  CBC: Recent Labs  Lab 04/15/24 1558 04/16/24 0449  WBC 2.1* 1.8*  NEUTROABS 1.0*  --   HGB 10.3* 9.3*  HCT 32.1* 27.7*  MCV 95.0 90.8  PLT 52* 45*   Basic Metabolic Panel: Recent Labs  Lab 04/15/24 1558 04/16/24 0449  NA 141 138  K 4.0 4.4  CL 112* 111  CO2 21* 21*  GLUCOSE 126* 137*  BUN 14 14  CREATININE 1.13 1.27*  CALCIUM  9.0 8.1*   GFR: CrCl cannot be calculated (Unknown ideal weight.). Liver Function Tests: Recent Labs  Lab 04/15/24 1558 04/16/24 0449  AST 23 35  ALT 11 16  ALKPHOS 104 98  BILITOT 1.9* 2.4*  PROT 7.3 6.4*  ALBUMIN 3.2* 2.9*   No results for input(s): LIPASE, AMYLASE in the last 168 hours. No results for input(s): AMMONIA in the last 168 hours. Coagulation Profile: Recent Labs  Lab 04/15/24 1558  INR 1.3*   Cardiac Enzymes: Recent Labs  Lab 04/15/24 1558  CKTOTAL 131   BNP (last 3 results) No results for input(s): PROBNP in the last 8760 hours. HbA1C: Recent Labs    04/16/24 0448  HGBA1C 5.6   CBG: Recent Labs  Lab 04/15/24 1559 04/16/24 0742 04/16/24 1144  GLUCAP 111* 139* 123*   Lipid Profile: Recent Labs    04/16/24 0449  CHOL 125  HDL 38*  LDLCALC 78  TRIG 44  CHOLHDL 3.3   Thyroid  Function Tests: No results for input(s): TSH, T4TOTAL, FREET4, T3FREE, THYROIDAB in the last 72 hours. Anemia Panel: No results for input(s):  VITAMINB12, FOLATE, FERRITIN, TIBC, IRON, RETICCTPCT in the last 72 hours. Sepsis Labs: No results for input(s): PROCALCITON, LATICACIDVEN in the last 168 hours.  No results found for this or any previous visit (from the past 240 hours).   Radiology Studies: ECHOCARDIOGRAM COMPLETE Result Date: 04/16/2024    ECHOCARDIOGRAM REPORT   Patient Name:   CHAU SAWIN Date of Exam: 04/16/2024 Medical Rec #:  [id number]      Height:       69.0 in Accession #:    [id number]     Weight:       172.4 lb Date of Birth:  [birth year]      BSA:          1.940 m Patient Age:    78 years       BP:           138/67 mmHg Patient Gender: M  HR:           74 bpm. Exam Location:  Inpatient Procedure: 2D Echo, Cardiac Doppler and Color Doppler (Both Spectral and Color            Flow Doppler were utilized during procedure). Indications:    Stroke 163.9  History:        Patient has prior history of Echocardiogram examinations, most                 recent 07/26/2020. CAD and Previous Myocardial Infarction,                 Stroke, Signs/Symptoms:Chest Pain and Shortness of Breath; Risk                 Factors:Dyslipidemia, Hypertension, Diabetes and Former Smoker.  Sonographer:    Merlynn Argyle Referring Phys: [phone] REDIA LOISE CLEAVER  Sonographer Comments: Image acquisition challenging due to respiratory motion. IMPRESSIONS  1. Left ventricular ejection fraction, by estimation, is 60 to 65%. The left ventricle has normal function. The left ventricle has no regional wall motion abnormalities. There is mild asymmetric left ventricular hypertrophy of the basal-septal segment. Left ventricular diastolic parameters are consistent with Grade I diastolic dysfunction (impaired relaxation).  2. Right ventricular systolic function is normal. The right ventricular size is mildly enlarged. There is normal pulmonary artery systolic pressure. The estimated right ventricular systolic pressure is 16.1 mmHg.  3. Left atrial  size was moderately dilated.  4. The mitral valve is degenerative. Mild mitral valve regurgitation. No evidence of mitral stenosis. Moderate mitral annular calcification.  5. The aortic valve is tricuspid. There is mild calcification of the aortic valve. There is mild thickening of the aortic valve. Aortic valve regurgitation is trivial. Aortic valve sclerosis is present, with no evidence of aortic valve stenosis.  6. The inferior vena cava is normal in size with greater than 50% respiratory variability, suggesting right atrial pressure of 3 mmHg. Conclusion(s)/Recommendation(s): No intracardiac source of embolism detected on this transthoracic study. Consider a transesophageal echocardiogram to exclude cardiac source of embolism if clinically indicated. FINDINGS  Left Ventricle: Left ventricular ejection fraction, by estimation, is 60 to 65%. The left ventricle has normal function. The left ventricle has no regional wall motion abnormalities. The left ventricular internal cavity size was normal in size. There is  mild asymmetric left ventricular hypertrophy of the basal-septal segment. Left ventricular diastolic parameters are consistent with Grade I diastolic dysfunction (impaired relaxation). Right Ventricle: The right ventricular size is mildly enlarged. No increase in right ventricular wall thickness. Right ventricular systolic function is normal. There is normal pulmonary artery systolic pressure. The tricuspid regurgitant velocity is 1.81  m/s, and with an assumed right atrial pressure of 3 mmHg, the estimated right ventricular systolic pressure is 16.1 mmHg. Left Atrium: Left atrial size was moderately dilated. Right Atrium: Right atrial size was normal in size. Pericardium: There is no evidence of pericardial effusion. Mitral Valve: The mitral valve is degenerative in appearance. There is mild thickening of the mitral valve leaflet(s). There is mild calcification of the mitral valve leaflet(s). Moderate  mitral annular calcification. Mild mitral valve regurgitation. No evidence of mitral valve stenosis. Tricuspid Valve: The tricuspid valve is normal in structure. Tricuspid valve regurgitation is trivial. No evidence of tricuspid stenosis. Aortic Valve: The aortic valve is tricuspid. There is mild calcification of the aortic valve. There is mild thickening of the aortic valve. Aortic valve regurgitation is trivial. Aortic valve sclerosis is present, with  no evidence of aortic valve stenosis. Pulmonic Valve: The pulmonic valve was normal in structure. Pulmonic valve regurgitation is trivial. No evidence of pulmonic stenosis. Aorta: The aortic root is normal in size and structure. Venous: The inferior vena cava is normal in size with greater than 50% respiratory variability, suggesting right atrial pressure of 3 mmHg. IAS/Shunts: No atrial level shunt detected by color flow Doppler.  LEFT VENTRICLE PLAX 2D LVIDd:         4.60 cm   Diastology LVIDs:         3.40 cm   LV e' medial:    4.66 cm/s LV PW:         1.00 cm   LV E/e' medial:  19.7 LV IVS:        1.30 cm   LV e' lateral:   15.50 cm/s LVOT diam:     2.20 cm   LV E/e' lateral: 5.9 LV SV:         108 LV SV Index:   56 LVOT Area:     3.80 cm  RIGHT VENTRICLE RV Basal diam:  4.30 cm RV Mid diam:    3.50 cm RV S prime:     21.80 cm/s TAPSE (M-mode): 1.5 cm LEFT ATRIUM             Index        RIGHT ATRIUM           Index LA diam:        4.00 cm 2.06 cm/m   RA Area:     19.00 cm LA Vol (A2C):   71.7 ml 36.97 ml/m  RA Volume:   51.60 ml  26.60 ml/m LA Vol (A4C):   96.7 ml 49.85 ml/m LA Biplane Vol: 87.0 ml 44.85 ml/m  AORTIC VALVE LVOT Vmax:   127.00 cm/s LVOT Vmean:  94.800 cm/s LVOT VTI:    0.285 m  AORTA Ao Root diam: 3.70 cm Ao Asc diam:  3.70 cm MITRAL VALVE               TRICUSPID VALVE MV Area (PHT): 2.55 cm    TR Peak grad:   13.1 mmHg MV Decel Time: 297 msec    TR Vmax:        181.00 cm/s MV E velocity: 92.00 cm/s MV A velocity: 90.30 cm/s  SHUNTS MV  E/A ratio:  1.02        Systemic VTI:  0.29 m                            Systemic Diam: 2.20 cm Oneil Parchment MD Electronically signed by Oneil Parchment MD Signature Date/Time: 04/16/2024/11:50:06 AM    Final    CT ANGIO HEAD NECK W WO CM Result Date: 04/16/2024 EXAM: CTA HEAD AND NECK WITHOUT AND WITH 04/16/2024 05:41:14 AM TECHNIQUE: CTA of the head and neck was performed without and with the administration of 75 mL of iohexol  (OMNIPAQUE ) 350 MG/ML injection. Multiplanar 2D and/or 3D reformatted images are provided for review. Automated exposure control, iterative reconstruction, and/or weight based adjustment of the mA/kV was utilized to reduce the radiation dose to as low as reasonably achievable. Stenosis of the internal carotid arteries measured using NASCET criteria. COMPARISON: CT of the head dated 04/15/2024 CLINICAL HISTORY: Stroke/TIA, determine embolic source. FINDINGS: CTA NECK: AORTIC ARCH AND ARCH VESSELS: No dissection or arterial injury. No significant stenosis of the brachiocephalic or subclavian arteries. There  is calcific atheromatous disease within the aortic arch. CERVICAL CAROTID ARTERIES: No dissection or arterial injury. There is moderate calcific plaque within the carotid bulbs and origins of the internal carotid arteries bilaterally. There is near occlusive stenosis of the proximal right internal carotid artery with estimated stenosis 90% or greater. There is extensive calcific plaque within the proximal left internal carotid artery with estimated stenosis approximately 60 to 70%. CERVICAL VERTEBRAL ARTERIES: No dissection, arterial injury, or significant stenosis. LUNGS AND MEDIASTINUM: Unremarkable. SOFT TISSUES: No acute abnormality. BONES: No acute abnormality. CTA HEAD: ANTERIOR CIRCULATION: There is moderately advanced calcific atheromatous disease within the carotid siphons. There is moderate stenosis of the cavernous and supraclinoid segments, approximately 40 to 50% bilaterally. No  significant stenosis of the anterior cerebral arteries. No significant stenosis of the middle cerebral arteries. No aneurysm. POSTERIOR CIRCULATION: There is moderate-to-severe stenosis of the P2 segment of the left posterior cerebral artery and there is moderate stenosis of the P2 segment of the right posterior cerebral artery. The focal stenoses are demonstrated on images 104 and 106 of series 9, respectively. No significant stenosis of the basilar artery. There is moderately advanced calcific atheromatous disease within the vertebral arteries. No aneurysm. OTHER: There is age-related atrophy and moderately advanced cerebral white matter disease. Chronic encephalomalacia changes are noted within the frontal and parietal lobes bilaterally from remote cortical infarcts. Chronic infarcts are also noted within the cerebellar hemispheres bilaterally. No dural venous sinus thrombosis on this non-dedicated study. IMPRESSION: 1. 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 2. Extensive calcific plaque within the proximal left internal carotid artery with estimated stenosis approximately 60 to 70%. 3. Moderate-to-severe stenosis of the P2 segment of the left posterior cerebral artery and moderate stenosis of the P2 segment of the right posterior cerebral artery. 4. Moderate stenosis of the cavernous and supraclinoid segments, approximately 40 to 50% bilaterally. Electronically signed by: Evalene Coho MD 04/16/2024 06:14 AM EST RP Workstation: [id number]   MR BRAIN WO CONTRAST Result Date: 04/15/2024 EXAM: MRI BRAIN WITHOUT CONTRAST 04/15/2024 07:46:35 PM TECHNIQUE: Multiplanar multisequence MRI of the head/brain was performed without the administration of intravenous contrast. COMPARISON: CT head from earlier today. MRI head 08/30/2016. CLINICAL HISTORY: Neuro deficit, acute, stroke suspected FINDINGS: BRAIN AND VENTRICLES: Punctate acute infarcts in  the high right frontal and left parasagittal frontal lobes (series 2, images 39 and 37). No intracranial hemorrhage. No mass. No midline shift. No hydrocephalus. Advanced T2 hyperintensity in the white matter, compatible with chronic avascular ischemic disease. Remote lacunar infarcts in the corona radiata and bilateral parietal cortex. Multiple remote cerebellar infarcts bilaterally. Normal flow voids. ORBITS: No acute abnormality. SINUSES AND MASTOIDS: No acute abnormality. BONES AND SOFT TISSUES: Normal marrow signal. No acute soft tissue abnormality. IMPRESSION: 1. Punctate acute infarcts in the high right frontal and left parasagittal frontal lobes. 2. 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 Electronically signed by: Gilmore Molt MD 04/15/2024 09:43 PM EST RP Workstation: [id number]   CT HEAD WO CONTRAST Result Date: 04/15/2024 CLINICAL DATA:  Clemens out of bed, neurologic deficit EXAM: CT HEAD WITHOUT CONTRAST TECHNIQUE: Contiguous axial images were obtained from the base of the skull through the vertex without intravenous contrast. RADIATION DOSE REDUCTION: This exam was performed according to the departmental dose-optimization program which includes automated exposure control, adjustment of the mA and/or kV according to patient size and/or use of iterative reconstruction technique. COMPARISON:  08/31/2016 FINDINGS: Brain: There  are chronic cortical infarcts along the bilateral frontal and parietal convexities, as well as within the bilateral cerebellar hemispheres. Chronic small vessel ischemic changes are seen throughout the periventricular and subcortical white matter. No evidence of acute infarct or hemorrhage. Dense bilateral basal ganglia calcifications are again noted. Lateral ventricles and midline structures are otherwise unremarkable. No acute extra-axial fluid collections. No mass effect. Vascular: No hyperdense vessel or unexpected calcification.  Skull: Normal. Negative for fracture or focal lesion. Sinuses/Orbits: No acute finding. Other: None. IMPRESSION: 1. No acute intracranial process. 2. Chronic ischemic changes as above. Electronically Signed   By: Ozell Daring M.D.   On: 04/15/2024 17:50   Scheduled Meds:  aspirin   81 mg Oral Daily   atorvastatin   80 mg Oral q1800   clopidogrel   75 mg Oral Daily   FLUoxetine   20 mg Oral Daily   insulin  aspart  0-9 Units Subcutaneous TID WC   Continuous Infusions:  sodium chloride  75 mL/hr at 04/16/24 1152     LOS: 0 days    Time spent: 50 mins    Darcel Dawley, MD [facility]   If 7PM-7AM, please contact night-coverage

## 2024-04-16 NOTE — Progress Notes (Signed)
° °  Brief Progress Note   _____________________________________________________________________________________________________________  Patient Name: Christopher Roberts Patient DOB: 1945-12-23 Date: @TODAY @      Data: Reviewed vital signs, labs, and notes.    Action: No action required at this time.     Response:    _____________________________________________________________________________________________________________  [facility] RN Expeditor Suzann Lazaro S Jaziah Goeller Please contact us  directly via secure chat (search for [facility]) or by calling us  at [phone] [facility]).

## 2024-04-16 NOTE — Progress Notes (Signed)
 OT Cancellation Note  Patient Details Name: JAPHET MORGENTHALER MRN: [id number] DOB: [birth year]   Cancelled Treatment:    Reason Eval/Treat Not Completed: Medical issues which prohibited therapy (hold for today per Dr. Jerri. Will follow up as pt medically ready and OT schedule allows.)  Gretta Samons D Walton, OTD, OTR/L [facility] Acute Rehabilitation Office: [phone]   [place]ma JONETTA Penner 04/16/2024, 9:13 AM

## 2024-04-16 NOTE — Progress Notes (Signed)
 PT Cancellation Note  Patient Details Name: Christopher Roberts MRN: [id number] DOB: 04-16-1946   Cancelled Treatment:    Reason Eval/Treat Not Completed: Patient not medically ready   Rodgers ORN [facility] 04/16/2024, 9:04 AM Rodgers Opal PT Acute Rehabilitation Services Office [phone]

## 2024-04-17 ENCOUNTER — Inpatient Hospital Stay (HOSPITAL_COMMUNITY)

## 2024-04-17 DIAGNOSIS — I6529 Occlusion and stenosis of unspecified carotid artery: Secondary | ICD-10-CM | POA: Diagnosis not present

## 2024-04-17 DIAGNOSIS — I639 Cerebral infarction, unspecified: Secondary | ICD-10-CM | POA: Diagnosis not present

## 2024-04-17 LAB — GLUCOSE, CAPILLARY
Glucose-Capillary: 177 mg/dL — ABNORMAL HIGH (ref 70–99)
Glucose-Capillary: 207 mg/dL — ABNORMAL HIGH (ref 70–99)
Glucose-Capillary: 179 mg/dL — ABNORMAL HIGH (ref 70–99)
Glucose-Capillary: 111 mg/dL — ABNORMAL HIGH (ref 70–99)

## 2024-04-17 LAB — MAGNESIUM: Magnesium: 1.6 mg/dL — ABNORMAL LOW (ref 1.7–2.4)

## 2024-04-17 LAB — CBC
HCT: 31.4 % — ABNORMAL LOW (ref 39.0–52.0)
Hemoglobin: 10.6 g/dL — ABNORMAL LOW (ref 13.0–17.0)
MCH: 30.8 pg (ref 26.0–34.0)
MCHC: 33.8 g/dL (ref 30.0–36.0)
MCV: 91.3 fL (ref 80.0–100.0)
Platelets: 49 K/uL — ABNORMAL LOW (ref 150–400)
RBC: 3.44 MIL/uL — ABNORMAL LOW (ref 4.22–5.81)
RDW: 19.3 % — ABNORMAL HIGH (ref 11.5–15.5)
WBC: 1.7 K/uL — ABNORMAL LOW (ref 4.0–10.5)
nRBC: 1.2 % — ABNORMAL HIGH (ref 0.0–0.2)

## 2024-04-17 LAB — BASIC METABOLIC PANEL WITH GFR
Anion gap: 7 (ref 5–15)
BUN: 15 mg/dL (ref 8–23)
CO2: 23 mmol/L (ref 22–32)
Calcium: 8.1 mg/dL — ABNORMAL LOW (ref 8.9–10.3)
Chloride: 107 mmol/L (ref 98–111)
Creatinine, Ser: 1.16 mg/dL (ref 0.61–1.24)
GFR, Estimated: >60 mL/min (ref >60)
Glucose, Bld: 148 mg/dL — ABNORMAL HIGH (ref 70–99)
Potassium: 3.9 mmol/L (ref 3.5–5.1)
Sodium: 137 mmol/L (ref 135–145)

## 2024-04-17 LAB — PHOSPHORUS: Phosphorus: 3.2 mg/dL (ref 2.5–4.6)

## 2024-04-17 MED ORDER — ENSURE PLUS HIGH PROTEIN PO LIQD
237.0000 mL | Freq: Two times a day (BID) | ORAL | Status: DC
  Administered 2024-04-17 – 2024-04-23 (×11): 237 mL via ORAL

## 2024-04-17 MED ORDER — MAGNESIUM SULFATE 2 GM/50ML IV SOLN
2.0000 g | Freq: Once | INTRAVENOUS | Status: AC
  Administered 2024-04-17: 2 g via INTRAVENOUS
  Filled 2024-04-17: qty 50

## 2024-04-17 MED ORDER — ORAL CARE MOUTH RINSE: 15.0000 mL | OROMUCOSAL | Status: DC | PRN

## 2024-04-17 NOTE — Progress Notes (Addendum)
 STROKE TEAM PROGRESS NOTE   SUBJECTIVE (INTERVAL HISTORY) Pt reports that today he is feeling better. He believes that his leg has better strength. He also understands the plan so far as to getting a procedure next week and that this is complicated by his platelets being low.  OBJECTIVE Temp:  [98.2 F (36.8 C)-99.7 F (37.6 C)] 98.7 F (37.1 C) (12/11 1116) Pulse Rate:  [67-131] 71 (12/11 1116) Cardiac Rhythm: Normal sinus rhythm (12/11 0700) Resp:  [14-20] 20 (12/11 1116) BP: (105-162)/(53-102) 120/63 (12/11 1116) SpO2:  [92 %-100 %] 100 % (12/11 1116) Weight:  [77.6 kg] 77.6 kg (12/10 1847)  Recent Labs  Lab 04/16/24 0742 04/16/24 1144 04/16/24 2116 04/17/24 0609 04/17/24 1113  GLUCAP 139* 123* 203* 177* 207*   Recent Labs  Lab 04/15/24 1558 04/16/24 0449 04/17/24 0259  NA 141 138 137  K 4.0 4.4 3.9  CL 112* 111 107  CO2 21* 21* 23  GLUCOSE 126* 137* 148*  BUN 14 14 15   CREATININE 1.13 1.27* 1.16  CALCIUM  9.0 8.1* 8.1*  MG  --   --  1.6*  PHOS  --   --  3.2   Recent Labs  Lab 04/15/24 1558 04/16/24 0449  AST 23 35  ALT 11 16  ALKPHOS 104 98  BILITOT 1.9* 2.4*  PROT 7.3 6.4*  ALBUMIN 3.2* 2.9*   Recent Labs  Lab 04/15/24 1558 04/16/24 0449 04/17/24 0259  WBC 2.1* 1.8* 1.7*  NEUTROABS 1.0*  --   --   HGB 10.3* 9.3* 10.6*  HCT 32.1* 27.7* 31.4*  MCV 95.0 90.8 91.3  PLT 52* 45* 49*   Recent Labs  Lab 04/15/24 1558  CKTOTAL 131   Recent Labs    04/15/24 1558  LABPROT 16.8*  INR 1.3*   No results for input(s): COLORURINE, LABSPEC, PHURINE, GLUCOSEU, HGBUR, BILIRUBINUR, KETONESUR, PROTEINUR, UROBILINOGEN, NITRITE, LEUKOCYTESUR in the last 72 hours.  Invalid input(s): APPERANCEUR     Component Value Date/Time   CHOL 125 04/16/2024 0449   TRIG 44 04/16/2024 0449   HDL 38 (L) 04/16/2024 0449   CHOLHDL 3.3 04/16/2024 0449   VLDL 9 04/16/2024 0449   LDLCALC 78 04/16/2024 0449   Lab Results  Component Value Date    HGBA1C 5.6 04/16/2024      Component Value Date/Time   LABOPIA NONE DETECTED 04/15/2024 1611   COCAINSCRNUR NONE DETECTED 04/15/2024 1611   LABBENZ NONE DETECTED 04/15/2024 1611   AMPHETMU NONE DETECTED 04/15/2024 1611   THCU NONE DETECTED 04/15/2024 1611   LABBARB NONE DETECTED 04/15/2024 1611    Recent Labs  Lab 04/15/24 1558  ETH <15    I have personally reviewed the radiological images below and agree with the radiology interpretations.  ECHOCARDIOGRAM COMPLETE Result Date: 04/16/2024    ECHOCARDIOGRAM REPORT   Patient Name:   Christopher Roberts Date of Exam: 04/16/2024 Medical Rec #:  [id number]      Height:       69.0 in Accession #:    [id number]     Weight:       172.4 lb Date of Birth:  07-Jul-1945      BSA:          1.940 m Patient Age:    78 years       BP:           138/67 mmHg Patient Gender: M              HR:  74 bpm. Exam Location:  Inpatient Procedure: 2D Echo, Cardiac Doppler and Color Doppler (Both Spectral and Color            Flow Doppler were utilized during procedure). Indications:    Stroke 163.9  History:        Patient has prior history of Echocardiogram examinations, most                 recent 07/26/2020. CAD and Previous Myocardial Infarction,                 Stroke, Signs/Symptoms:Chest Pain and Shortness of Breath; Risk                 Factors:Dyslipidemia, Hypertension, Diabetes and Former Smoker.  Sonographer:    Merlynn Argyle Referring Phys: [phone] REDIA LOISE CLEAVER  Sonographer Comments: Image acquisition challenging due to respiratory motion. IMPRESSIONS  1. Left ventricular ejection fraction, by estimation, is 60 to 65%. The left ventricle has normal function. The left ventricle has no regional wall motion abnormalities. There is mild asymmetric left ventricular hypertrophy of the basal-septal segment. Left ventricular diastolic parameters are consistent with Grade I diastolic dysfunction (impaired relaxation).  2. Right ventricular systolic function is  normal. The right ventricular size is mildly enlarged. There is normal pulmonary artery systolic pressure. The estimated right ventricular systolic pressure is 16.1 mmHg.  3. Left atrial size was moderately dilated.  4. The mitral valve is degenerative. Mild mitral valve regurgitation. No evidence of mitral stenosis. Moderate mitral annular calcification.  5. The aortic valve is tricuspid. There is mild calcification of the aortic valve. There is mild thickening of the aortic valve. Aortic valve regurgitation is trivial. Aortic valve sclerosis is present, with no evidence of aortic valve stenosis.  6. The inferior vena cava is normal in size with greater than 50% respiratory variability, suggesting right atrial pressure of 3 mmHg. Conclusion(s)/Recommendation(s): No intracardiac source of embolism detected on this transthoracic study. Consider a transesophageal echocardiogram to exclude cardiac source of embolism if clinically indicated. FINDINGS  Left Ventricle: Left ventricular ejection fraction, by estimation, is 60 to 65%. The left ventricle has normal function. The left ventricle has no regional wall motion abnormalities. The left ventricular internal cavity size was normal in size. There is  mild asymmetric left ventricular hypertrophy of the basal-septal segment. Left ventricular diastolic parameters are consistent with Grade I diastolic dysfunction (impaired relaxation). Right Ventricle: The right ventricular size is mildly enlarged. No increase in right ventricular wall thickness. Right ventricular systolic function is normal. There is normal pulmonary artery systolic pressure. The tricuspid regurgitant velocity is 1.81  m/s, and with an assumed right atrial pressure of 3 mmHg, the estimated right ventricular systolic pressure is 16.1 mmHg. Left Atrium: Left atrial size was moderately dilated. Right Atrium: Right atrial size was normal in size. Pericardium: There is no evidence of pericardial effusion.  Mitral Valve: The mitral valve is degenerative in appearance. There is mild thickening of the mitral valve leaflet(s). There is mild calcification of the mitral valve leaflet(s). Moderate mitral annular calcification. Mild mitral valve regurgitation. No evidence of mitral valve stenosis. Tricuspid Valve: The tricuspid valve is normal in structure. Tricuspid valve regurgitation is trivial. No evidence of tricuspid stenosis. Aortic Valve: The aortic valve is tricuspid. There is mild calcification of the aortic valve. There is mild thickening of the aortic valve. Aortic valve regurgitation is trivial. Aortic valve sclerosis is present, with no evidence of aortic valve stenosis. Pulmonic Valve: The pulmonic valve  was normal in structure. Pulmonic valve regurgitation is trivial. No evidence of pulmonic stenosis. Aorta: The aortic root is normal in size and structure. Venous: The inferior vena cava is normal in size with greater than 50% respiratory variability, suggesting right atrial pressure of 3 mmHg. IAS/Shunts: No atrial level shunt detected by color flow Doppler.  LEFT VENTRICLE PLAX 2D LVIDd:         4.60 cm   Diastology LVIDs:         3.40 cm   LV e' medial:    4.66 cm/s LV PW:         1.00 cm   LV E/e' medial:  19.7 LV IVS:        1.30 cm   LV e' lateral:   15.50 cm/s LVOT diam:     2.20 cm   LV E/e' lateral: 5.9 LV SV:         108 LV SV Index:   56 LVOT Area:     3.80 cm  RIGHT VENTRICLE RV Basal diam:  4.30 cm RV Mid diam:    3.50 cm RV S prime:     21.80 cm/s TAPSE (M-mode): 1.5 cm LEFT ATRIUM             Index        RIGHT ATRIUM           Index LA diam:        4.00 cm 2.06 cm/m   RA Area:     19.00 cm LA Vol (A2C):   71.7 ml 36.97 ml/m  RA Volume:   51.60 ml  26.60 ml/m LA Vol (A4C):   96.7 ml 49.85 ml/m LA Biplane Vol: 87.0 ml 44.85 ml/m  AORTIC VALVE LVOT Vmax:   127.00 cm/s LVOT Vmean:  94.800 cm/s LVOT VTI:    0.285 m  AORTA Ao Root diam: 3.70 cm Ao Asc diam:  3.70 cm MITRAL VALVE                TRICUSPID VALVE MV Area (PHT): 2.55 cm    TR Peak grad:   13.1 mmHg MV Decel Time: 297 msec    TR Vmax:        181.00 cm/s MV E velocity: 92.00 cm/s MV A velocity: 90.30 cm/s  SHUNTS MV E/A ratio:  1.02        Systemic VTI:  0.29 m                            Systemic Diam: 2.20 cm Oneil Parchment MD Electronically signed by Oneil Parchment MD Signature Date/Time: 04/16/2024/11:50:06 AM    Final    CT ANGIO HEAD NECK W WO CM Result Date: 04/16/2024 EXAM: CTA HEAD AND NECK WITHOUT AND WITH 04/16/2024 05:41:14 AM TECHNIQUE: CTA of the head and neck was performed without and with the administration of 75 mL of iohexol  (OMNIPAQUE ) 350 MG/ML injection. Multiplanar 2D and/or 3D reformatted images are provided for review. Automated exposure control, iterative reconstruction, and/or weight based adjustment of the mA/kV was utilized to reduce the radiation dose to as low as reasonably achievable. Stenosis of the internal carotid arteries measured using NASCET criteria. COMPARISON: CT of the head dated 04/15/2024 CLINICAL HISTORY: Stroke/TIA, determine embolic source. FINDINGS: CTA NECK: AORTIC ARCH AND ARCH VESSELS: No dissection or arterial injury. No significant stenosis of the brachiocephalic or subclavian arteries. There is calcific atheromatous disease within the aortic arch. CERVICAL CAROTID ARTERIES:  No dissection or arterial injury. There is moderate calcific plaque within the carotid bulbs and origins of the internal carotid arteries bilaterally. There is near occlusive stenosis of the proximal right internal carotid artery with estimated stenosis 90% or greater. There is extensive calcific plaque within the proximal left internal carotid artery with estimated stenosis approximately 60 to 70%. CERVICAL VERTEBRAL ARTERIES: No dissection, arterial injury, or significant stenosis. LUNGS AND MEDIASTINUM: Unremarkable. SOFT TISSUES: No acute abnormality. BONES: No acute abnormality. CTA HEAD: ANTERIOR CIRCULATION: There is  moderately advanced calcific atheromatous disease within the carotid siphons. There is moderate stenosis of the cavernous and supraclinoid segments, approximately 40 to 50% bilaterally. No significant stenosis of the anterior cerebral arteries. No significant stenosis of the middle cerebral arteries. No aneurysm. POSTERIOR CIRCULATION: There is moderate-to-severe stenosis of the P2 segment of the left posterior cerebral artery and there is moderate stenosis of the P2 segment of the right posterior cerebral artery. The focal stenoses are demonstrated on images 104 and 106 of series 9, respectively. No significant stenosis of the basilar artery. There is moderately advanced calcific atheromatous disease within the vertebral arteries. No aneurysm. OTHER: There is age-related atrophy and moderately advanced cerebral white matter disease. Chronic encephalomalacia changes are noted within the frontal and parietal lobes bilaterally from remote cortical infarcts. Chronic infarcts are also noted within the cerebellar hemispheres bilaterally. No dural venous sinus thrombosis on this non-dedicated study. IMPRESSION: 1. 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 2. Extensive calcific plaque within the proximal left internal carotid artery with estimated stenosis approximately 60 to 70%. 3. Moderate-to-severe stenosis of the P2 segment of the left posterior cerebral artery and moderate stenosis of the P2 segment of the right posterior cerebral artery. 4. Moderate stenosis of the cavernous and supraclinoid segments, approximately 40 to 50% bilaterally. Electronically signed by: Evalene Coho MD 04/16/2024 06:14 AM EST RP Workstation: [id number]   MR BRAIN WO CONTRAST Result Date: 04/15/2024 EXAM: MRI BRAIN WITHOUT CONTRAST 04/15/2024 07:46:35 PM TECHNIQUE: Multiplanar multisequence MRI of the head/brain was performed without the administration of intravenous  contrast. COMPARISON: CT head from earlier today. MRI head 08/30/2016. CLINICAL HISTORY: Neuro deficit, acute, stroke suspected FINDINGS: BRAIN AND VENTRICLES: Punctate acute infarcts in the high right frontal and left parasagittal frontal lobes (series 2, images 39 and 37). No intracranial hemorrhage. No mass. No midline shift. No hydrocephalus. Advanced T2 hyperintensity in the white matter, compatible with chronic avascular ischemic disease. Remote lacunar infarcts in the corona radiata and bilateral parietal cortex. Multiple remote cerebellar infarcts bilaterally. Normal flow voids. ORBITS: No acute abnormality. SINUSES AND MASTOIDS: No acute abnormality. BONES AND SOFT TISSUES: Normal marrow signal. No acute soft tissue abnormality. IMPRESSION: 1. Punctate acute infarcts in the high right frontal and left parasagittal frontal lobes. 2. 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 Electronically signed by: Gilmore Molt MD 04/15/2024 09:43 PM EST RP Workstation: [id number]   CT HEAD WO CONTRAST Result Date: 04/15/2024 CLINICAL DATA:  Clemens out of bed, neurologic deficit EXAM: CT HEAD WITHOUT CONTRAST TECHNIQUE: Contiguous axial images were obtained from the base of the skull through the vertex without intravenous contrast. RADIATION DOSE REDUCTION: This exam was performed according to the departmental dose-optimization program which includes automated exposure control, adjustment of the mA and/or kV according to patient size and/or use of iterative reconstruction technique. COMPARISON:  08/31/2016 FINDINGS: Brain: There are chronic cortical infarcts along the bilateral frontal and parietal convexities,  as well as within the bilateral cerebellar hemispheres. Chronic small vessel ischemic changes are seen throughout the periventricular and subcortical white matter. No evidence of acute infarct or hemorrhage. Dense bilateral basal ganglia calcifications are again noted.  Lateral ventricles and midline structures are otherwise unremarkable. No acute extra-axial fluid collections. No mass effect. Vascular: No hyperdense vessel or unexpected calcification. Skull: Normal. Negative for fracture or focal lesion. Sinuses/Orbits: No acute finding. Other: None. IMPRESSION: 1. No acute intracranial process. 2. Chronic ischemic changes as above. Electronically Signed   By: Ozell Daring M.D.   On: 04/15/2024 17:50     PHYSICAL EXAM  Temp:  [98.2 F (36.8 C)-99.7 F (37.6 C)] 98.7 F (37.1 C) (12/11 1116) Pulse Rate:  [67-131] 71 (12/11 1116) Resp:  [14-20] 20 (12/11 1116) BP: (105-162)/(53-102) 120/63 (12/11 1116) SpO2:  [92 %-100 %] 100 % (12/11 1116) Weight:  [77.6 kg] 77.6 kg (12/10 1847)  General - Well nourished, well developed, in no apparent distress.  Mental Status -  Level of arousal and orientation to time, place, and person were intact. Language including expression, naming, repetition, comprehension was assessed and found intact. Attention span and concentration were normal. Recent and remote memory were intact. Fund of Knowledge was assessed and was intact.  Cranial Nerves II - XII - II - Visual field intact OU. III, IV, VI - Extraocular movements intact. V - Facial sensation intact bilaterally. VII - Facial movement intact bilaterally. VIII - Hearing & vestibular intact bilaterally. X - Palate elevates symmetrically. XI - Chin turning & shoulder shrug intact bilaterally. XII - Tongue protrusion intact.  Motor Strength - L arm and leg 5/5. R arm 5/5. R hip flexion 5/5, knee extension 5/5, dorsi and plantar flexion 5/5. Bulk was normal and fasciculations were absent. Overall improved significantly from yesterday.  Sensory - Light touch were assessed and were symmetrical.    Coordination - Some R leg ataxia in heel to shin test.  Tremor was absent.  Gait and Station - deferred.   ASSESSMENT/PLAN Mr. Christopher Roberts is a 78 y.o. male with  history of CAD s/p CABG, DM, DJD, HLD, HTN admitted for Acute R sided weakness. Imaging shows R ICA >90% stenosis, L ICA 60-70% stenosis, with likely L ICA related symptoms. Need to keep him from hypotension. VSS planning on TCAR on Monday 04/21/2024. Heme/Onc consulted for pancytopenia: recommended platelets >50k for procedure and Hgb >7.0 and to transfuse as needed.    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 2D Echo: EF 60-65%, LA moderately dilated, RA normal in size CUS R ICA 60-79% stenosis, L ICA 40-59% stenosis LDL 78 HgbA1c 5.9 UDS negative aspirin  81 mg daily and clopidogrel  75 mg daily prior to admission, now on aspirin  81 mg daily and clopidogrel  75 mg daily.  Further regimen per [facility] Patient counseled to be compliant with his antithrombotic medications Ongoing aggressive stroke risk factor management Therapy recommendations: CIR Disposition: Pending  Fluctuating right-sided weakness 12/10 Reported by EMS patient was flaccid on right side, improved in the ER.  However this morning had worsening weakness on right side again when BP on the low end Likely due to flow-limiting stenosis of bilateral ICAs Off IV fluid  Encourage p.o. intake Avoid low BP perioperatively  ICA stenosis CTA: R ICA Stenosis >90%, L ICA Stenosis 60-70% CUS R ICA 60-79% stenosis, L ICA 40-59% stenosis Although right stenosis more significant, however left stenosis more symptomatic Vascular surgery consulted, appreciate Dr. Gaylene assistance Plan for TCAR on 04/21/2024  Pancytopenia WBC 2.1--1.8--1.7 Hemoglobin 10.3-9.3-10.6 Platelet 52-45-49 Initial plan for bone marrow biopsy next week to rule out  [facility] Oncology consulted, recommended platelets >50k for procedure and Hgb >7.0 and to transfuse as needed.  Hypertension Home meds including Coreg  BP 120s now Hold off Coreg  for now Avoid low BP BP goal 140-160 before carotid revascularization  Hyperlipidemia Home meds:  Lipitor  80mg  daily LDL 78, goal < 70 Now on Lipitor  80mg  daily Continue statin at discharge  Other Stroke Risk Factors Advanced age Coronary artery disease supposed CABG  Other medical issues AKI on CKD, creatinine 1.13-1.27-1.16 Depression on Prozac   Hospital day # 1  D. Penne Mori, DO, PGY1 Neurology Stroke Team 04/17/2024 11:28 AM  ATTENDING NOTE: I reviewed above note and agree with the assessment and plan. Pt was seen and examined.   No family at bedside.  Patient lying bed, right lower extremity strength much improved from yesterday, near baseline.  May have slight right heel-to-shin dysmetria.  Vascular surgery Dr. Gretta consulted plan for TCAR next week.  Oncology also curbside consulted for pancytopenia, recommend platelet more than 50 for procedure.  On DAPT and statin.  PT and OT recommend CIR.  For detailed assessment and plan, please refer to above as I have made changes wherever appropriate.   Neurology will sign off. Please call with questions. Pt will follow up with stroke clinic NP at [facility] in about 4 weeks. Thanks for the consult.   Ary Cummins, MD PhD Stroke Neurology 04/17/2024 6:21 PM     To contact Stroke Continuity provider, please refer to [email or web address]. After hours, contact General Neurology

## 2024-04-17 NOTE — Evaluation (Signed)
 Speech Language Pathology Evaluation Patient Details Name: Christopher Roberts MRN: [id number] DOB: 09-13-1945 Today's Date: 04/17/2024 Time: 9069-9054 SLP Time Calculation (min) (ACUTE ONLY): 15 min  Problem List:  Patient Active Problem List   Diagnosis Date Noted   Acute CVA (cerebrovascular accident) (HCC) 04/15/2024   Other pancytopenia (HCC) 03/17/2024   Cirrhosis of liver (HCC) 03/12/2024   Weight loss 03/12/2024   Arthralgia of right knee 04/12/2022   Pain in joint of right shoulder 04/12/2022   Stiffness of right shoulder joint 04/12/2022   Major depressive disorder, recurrent, moderate (HCC) 01/27/2022   Generalized anxiety disorder 01/27/2022   Arthralgia of right foot 12/10/2020   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   Depression 07/27/2020   STEMI involving right coronary artery (HCC) 07/25/2020   ST elevation myocardial infarction involving right coronary artery (HCC) 07/25/2020   Educated about COVID-19 virus infection 11/04/2019   Vertigo    Hypertensive urgency 08/30/2016   Diabetes mellitus (HCC) 08/30/2016   Nonspecific abnormal results of cardiovascular function study 10/25/2010   Shortness of breath 10/18/2010   Murmur 10/18/2010   Tobacco abuse 10/18/2010   History of colonic polyps 03/21/2010   Hyperlipidemia 10/13/2008   Essential hypertension 10/13/2008   Coronary artery disease due to lipid rich plaque 10/13/2008   CHEST PAIN-UNSPECIFIED 10/13/2008   CHEST PAIN-PRECORDIAL 10/13/2008   Past Medical History:  Past Medical History:  Diagnosis Date   CAD (coronary artery disease)    a. s/p MI in 2005 (symptom of indigestion); b. CABG x 3 in 3/09: L-LAD, S-OM, EF unknown   Colon polyps    Diabetes mellitus    DJD (degenerative joint disease)    HLD (hyperlipidemia)    HTN (hypertension)    MI (myocardial infarction) (HCC) 2005   manifested by indigestion   Murmur    echo 6/12:  Upper septal thickening, no LVOT  gradient,, EF 65%, mild LAE     Past Surgical History:  Past Surgical History:  Procedure Laterality Date   CHOLECYSTECTOMY  2002   CORONARY ARTERY BYPASS GRAFT  2009   x3   CORONARY/GRAFT ACUTE MI REVASCULARIZATION N/A 07/25/2020   Procedure: Coronary/Graft Acute MI Revascularization;  Surgeon: Jordan, Peter M, MD;  Location: [facility] INVASIVE CV LAB;  Service: Cardiovascular;  Laterality: N/A;   LEFT HEART CATH AND CORS/GRAFTS ANGIOGRAPHY N/A 07/25/2020   Procedure: LEFT HEART CATH AND CORS/GRAFTS ANGIOGRAPHY;  Surgeon: Jordan, Peter M, MD;  Location: [facility] INVASIVE CV LAB;  Service: Cardiovascular;  Laterality: N/A;   TONSILLECTOMY     HPI:  78 y.o. F adm 04/15/24 s/p fall with RLE weakness. CTA Head/Neck showed right ICA stenosis >90, left ICA stenosis 60-70%. MRI demonstrated acute punctate CVA. Plan for TCAR on Monday 12/15. PMHx: CAD s/p PCI 2022, T2DM, pancytopenia, HTN, DJD, HLD, MI, CABGx3. Oncologist worked up for possible MDS.   Assessment / Plan / Recommendation Clinical Impression  Patient presents with moderate cognitive impairments across domains of orientation, memory, attention, problem solving, awareness, and executive functioning as demonstrated across patient interview, functional tasks, and administration of SLUMS examination. Patient scored 13/30 on SLUMS examination with relative strengths in the areas of visuospatial recognition and working memory. Patient recalled 0/5 items after 3 minute distracted delay and category cues only increased accuracy to 1/5. He required ++processing time during tasks and during conversation. Family not present to corroborate baseline and wife does assist with medications. Patient demonstrated no overt deficits in the areas of receptive/expressive language nor  motor speech. Recommend ongoing SLP services to target aforementioned cognitive deficits during admission.    SLP Assessment  SLP Recommendation/Assessment: Patient needs continued Speech  Language Pathology Services SLP Visit Diagnosis: Cognitive communication deficit (R41.841)     Assistance Recommended at Discharge  Frequent or constant Supervision/Assistance  Functional Status Assessment Patient has had a recent decline in their functional status and demonstrates the ability to make significant improvements in function in a reasonable and predictable amount of time.  Frequency and Duration min 2x/week  2 weeks      SLP Evaluation Cognition  Overall Cognitive Status: Impaired/Different from baseline Arousal/Alertness: Awake/alert Orientation Level: Oriented to person;Oriented to place;Oriented to situation;Disoriented to time Year: 2024 Day of Week: Incorrect Attention: Sustained Sustained Attention: Impaired Sustained Attention Impairment: Verbal basic;Functional basic Memory: Impaired Memory Impairment: Storage deficit;Retrieval deficit Awareness: Impaired Awareness Impairment: Intellectual impairment;Emergent impairment;Anticipatory impairment Problem Solving: Impaired Problem Solving Impairment: Verbal basic;Functional basic Executive Function: Sequencing;Organizing Sequencing: Impaired Sequencing Impairment: Verbal basic;Functional basic Organizing: Impaired Organizing Impairment: Verbal basic;Functional basic Safety/Judgment: Impaired       Comprehension  Auditory Comprehension Overall Auditory Comprehension: Appears within functional limits for tasks assessed Reading Comprehension Reading Status: Not tested    Expression Expression Primary Mode of Expression: Verbal Verbal Expression Overall Verbal Expression: Appears within functional limits for tasks assessed Written Expression Dominant Hand: Left Written Expression: Not tested   Oral / Motor  Oral Motor/Sensory Function Overall Oral Motor/Sensory Function: Within functional limits Motor Speech Overall Motor Speech: Appears within functional limits for tasks assessed Intelligibility:  Intelligible            [name] Ieesha Abbasi 04/17/2024, 1:09 PM

## 2024-04-17 NOTE — Progress Notes (Signed)
 OT Cancellation Note  Patient Details Name: Christopher Roberts MRN: [id number] DOB: 1945-05-18   Cancelled Treatment:    Reason Eval/Treat Not Completed: Active bedrest order (Pt with active bedrest order, per hospitalist (secure chat) hold OT eval at this time. Per vascular notes, plan for TCAR on Monday 12/15. Will continue efforts as able/as appropriate.)   Lavarius Doughten M. [place], OTR/L [facility] [phone] Secure Chat Preferred  Briton Sellman 04/17/2024, 10:27 AM

## 2024-04-17 NOTE — Progress Notes (Signed)
 Carotid duplex has been completed.   Results can be found under chart review under CV PROC. 04/17/2024 4:08 PM Roy Snuffer RVT, RDMS

## 2024-04-17 NOTE — Progress Notes (Signed)
 PROGRESS NOTE    Christopher Roberts  FMW:[id number] DOB: [birth year] DOA: 04/15/2024 PCP: [name], [name], MD   Brief Narrative:  This 78 yrs old Male with PMH significant for CAD status post PCI in 2022, diabetes mellitus type 2, pancytopenia following up with Dr. Autumn oncologist and being worked up for possible MDS, hypertension who had a fall today morning when he tried to get up from the bed around 10 AM.  Patient lost balance and fell onto the floor and was on the floor for almost an hour when his family member found him.  Later noticed to have right lower extremity weakness and was brought in the ED.  EMS noted that his right leg was flaccid.   MRI brain shows features concerning for acute punctate stroke. Patient's right lower extremity slowly improving and is able to move at this time and his strength is around 3/ 5.  Neurologist was consulted and recommended stroke workup for acute stroke.  Patient was admitted for further evaluation.   Assessment & Plan:   Principal Problem:   Acute CVA (cerebrovascular accident) [facility]) Active Problems:   Hyperlipidemia   Essential hypertension   Coronary artery disease due to lipid rich plaque   Diabetes mellitus (HCC)   Cirrhosis of liver (HCC)  Acute CVA: R punctate MCA/ACA infarcts Patient presented with RLE weakness, MRI shows acute punctate stroke.   Patient was evaluated by Neurology , Appreciate neurology consult and recommendations.   Continue aspirin , Plavix  and statins.   Allow for permissive hypertension for 24 hrs ,  hold antihypertensives.  Frequent Neurochecks.  Patient passed stroke swallow screen.  CTA Head/ Neck showed right ICA stenosis > 90, left ICA stenosis  60 to 70% 2D echo shows LVEF 60 to 65%, LA moderately dilated. LDL 78 Near goal.  Hb A1c 5.9.  Drug screen negative. PT and OT evaluation.  Aggressive risk factor modification. Vascular surgery consulted for bilateral carotid artery stenosis.  Carotid duplex  ordered.  Bilateral carotid artery stenosis: Vascular surgery is consulted.  Carotid duplex ordered. Although right the stenosis is more significant however left stenosis more symptomatic,  Vascular surgery consulted appreciate Dr. Gaylene assistance.  Plan for TCAR on 04/21/2024.  Essential hypertension: Given acute stroke , will allow for permissive hypertension.   hold antihypertensives.  History of CAD status post PCI: He  denies chest pain. Continue antiplatelet and statins, hold beta-blockers.  Pancytopenia :  Patient being followed by oncologist,  has plans for bone marrow biopsy next week to rule out MDS. Hematology/ oncology consulted. Hematology recommended to keep platelet above 50K for procedure and hemoglobin above 7 and to transfuse as needed.  History of SVT : He is being followed by cardiologist.  Diabetes mellitus type 2:  Last hemoglobin A1c was 5.9, He takes metformin  at home,  presently on sliding scale coverage.  Cirrhosis of liver : It was recently noted in oncology notes.  Closely monitor.  Further workup as outpatient.   Note that patient is receiving fluids at this time due to low normal blood pressure.  Depression Continue fluoxetine .   DVT prophylaxis: SCDs Code Status: Full code Family Communication:  No family at bed side. Disposition Plan:  Admitted for CVA , Workup in progress. Oncology is consulted for pancytopenia. Vascular surgery is consulted for bilateral carotid artery stenosis. Scheduled for Right TCAR on Monday.   Consultants:  Neurology Oncology Vascular surgery  Procedures: CT, MRI  Antimicrobials:  Anti-infectives (From admission, onward)  None      Subjective: Patient was seen and examined at bedside.  Overnight events noted. Patient was sitting comfortably in the bed, denies any pain,  He reports feeling better, he was drinking protein shake.  Objective: Vitals:   04/17/24 0534 04/17/24 0535 04/17/24 0748  04/17/24 1116  BP: (!) 117/102 107/62 (!) 105/53 120/63  Pulse: 89 90 73 71  Resp: 20  18 20   Temp: 98.6 F (37 C)  98.9 F (37.2 C) 98.7 F (37.1 C)  TempSrc: Oral  Oral Oral  SpO2: 99% 99% 100% 100%  Weight:      Height:        Intake/Output Summary (Last 24 hours) at 04/17/2024 1459 Last data filed at 04/17/2024 1343 Gross per 24 hour  Intake 1148.6 ml  Output 300 ml  Net 848.6 ml   Filed Weights   04/16/24 1847  Weight: 77.6 kg    Examination:  General exam: Appears calm and comfortable, not in any acute distress. Respiratory system: CTA Bilaterally. Respiratory effort normal. RR 17 Cardiovascular system: S1 & S2 heard, RRR. No JVD, murmurs, rubs, gallops or clicks.  Gastrointestinal system: Abdomen is non distended, soft and non tender.  Normal bowel sounds heard. Central nervous system: Alert and oriented X 3. No focal neurological deficits. Extremities: No edema, no cyanosis, no clubbing. Skin: No rashes, lesions or ulcers Psychiatry: Judgement and insight appear normal. Mood & affect appropriate.     Data Reviewed: I have personally reviewed following labs and imaging studies  CBC: Recent Labs  Lab 04/15/24 1558 04/16/24 0449 04/17/24 0259  WBC 2.1* 1.8* 1.7*  NEUTROABS 1.0*  --   --   HGB 10.3* 9.3* 10.6*  HCT 32.1* 27.7* 31.4*  MCV 95.0 90.8 91.3  PLT 52* 45* 49*   Basic Metabolic Panel: Recent Labs  Lab 04/15/24 1558 04/16/24 0449 04/17/24 0259  NA 141 138 137  K 4.0 4.4 3.9  CL 112* 111 107  CO2 21* 21* 23  GLUCOSE 126* 137* 148*  BUN 14 14 15   CREATININE 1.13 1.27* 1.16  CALCIUM  9.0 8.1* 8.1*  MG  --   --  1.6*  PHOS  --   --  3.2   GFR: Estimated Creatinine Clearance: 53.3 mL/min (by C-G formula based on SCr of 1.16 mg/dL). Liver Function Tests: Recent Labs  Lab 04/15/24 1558 04/16/24 0449  AST 23 35  ALT 11 16  ALKPHOS 104 98  BILITOT 1.9* 2.4*  PROT 7.3 6.4*  ALBUMIN 3.2* 2.9*   No results for input(s): LIPASE,  AMYLASE in the last 168 hours. No results for input(s): AMMONIA in the last 168 hours. Coagulation Profile: Recent Labs  Lab 04/15/24 1558  INR 1.3*   Cardiac Enzymes: Recent Labs  Lab 04/15/24 1558  CKTOTAL 131   BNP (last 3 results) No results for input(s): PROBNP in the last 8760 hours. HbA1C: Recent Labs    04/16/24 0448  HGBA1C 5.6   CBG: Recent Labs  Lab 04/16/24 0742 04/16/24 1144 04/16/24 2116 04/17/24 0609 04/17/24 1113  GLUCAP 139* 123* 203* 177* 207*   Lipid Profile: Recent Labs    04/16/24 0449  CHOL 125  HDL 38*  LDLCALC 78  TRIG 44  CHOLHDL 3.3   Thyroid  Function Tests: No results for input(s): TSH, T4TOTAL, FREET4, T3FREE, THYROIDAB in the last 72 hours. Anemia Panel: No results for input(s): VITAMINB12, FOLATE, FERRITIN, TIBC, IRON, RETICCTPCT in the last 72 hours. Sepsis Labs: No results for input(s): PROCALCITON,  LATICACIDVEN in the last 168 hours.  No results found for this or any previous visit (from the past 240 hours).   Radiology Studies: ECHOCARDIOGRAM COMPLETE Result Date: 04/16/2024    ECHOCARDIOGRAM REPORT   Patient Name:   Christopher Roberts Date of Exam: 04/16/2024 Medical Rec #:  [id number]      Height:       69.0 in Accession #:    [id number]     Weight:       172.4 lb Date of Birth:  January 15, 1946      BSA:          1.940 m Patient Age:    74 years       BP:           138/67 mmHg Patient Gender: M              HR:           74 bpm. Exam Location:  Inpatient Procedure: 2D Echo, Cardiac Doppler and Color Doppler (Both Spectral and Color            Flow Doppler were utilized during procedure). Indications:    Stroke 163.9  History:        Patient has prior history of Echocardiogram examinations, most                 recent 07/26/2020. CAD and Previous Myocardial Infarction,                 Stroke, Signs/Symptoms:Chest Pain and Shortness of Breath; Risk                 Factors:Dyslipidemia, Hypertension, Diabetes  and Former Smoker.  Sonographer:    Merlynn Argyle Referring Phys: [phone] REDIA LOISE CLEAVER  Sonographer Comments: Image acquisition challenging due to respiratory motion. IMPRESSIONS  1. Left ventricular ejection fraction, by estimation, is 60 to 65%. The left ventricle has normal function. The left ventricle has no regional wall motion abnormalities. There is mild asymmetric left ventricular hypertrophy of the basal-septal segment. Left ventricular diastolic parameters are consistent with Grade I diastolic dysfunction (impaired relaxation).  2. Right ventricular systolic function is normal. The right ventricular size is mildly enlarged. There is normal pulmonary artery systolic pressure. The estimated right ventricular systolic pressure is 16.1 mmHg.  3. Left atrial size was moderately dilated.  4. The mitral valve is degenerative. Mild mitral valve regurgitation. No evidence of mitral stenosis. Moderate mitral annular calcification.  5. The aortic valve is tricuspid. There is mild calcification of the aortic valve. There is mild thickening of the aortic valve. Aortic valve regurgitation is trivial. Aortic valve sclerosis is present, with no evidence of aortic valve stenosis.  6. The inferior vena cava is normal in size with greater than 50% respiratory variability, suggesting right atrial pressure of 3 mmHg. Conclusion(s)/Recommendation(s): No intracardiac source of embolism detected on this transthoracic study. Consider a transesophageal echocardiogram to exclude cardiac source of embolism if clinically indicated. FINDINGS  Left Ventricle: Left ventricular ejection fraction, by estimation, is 60 to 65%. The left ventricle has normal function. The left ventricle has no regional wall motion abnormalities. The left ventricular internal cavity size was normal in size. There is  mild asymmetric left ventricular hypertrophy of the basal-septal segment. Left ventricular diastolic parameters are consistent with Grade I  diastolic dysfunction (impaired relaxation). Right Ventricle: The right ventricular size is mildly enlarged. No increase in right ventricular wall thickness. Right ventricular systolic function is normal. There is normal  pulmonary artery systolic pressure. The tricuspid regurgitant velocity is 1.81  m/s, and with an assumed right atrial pressure of 3 mmHg, the estimated right ventricular systolic pressure is 16.1 mmHg. Left Atrium: Left atrial size was moderately dilated. Right Atrium: Right atrial size was normal in size. Pericardium: There is no evidence of pericardial effusion. Mitral Valve: The mitral valve is degenerative in appearance. There is mild thickening of the mitral valve leaflet(s). There is mild calcification of the mitral valve leaflet(s). Moderate mitral annular calcification. Mild mitral valve regurgitation. No evidence of mitral valve stenosis. Tricuspid Valve: The tricuspid valve is normal in structure. Tricuspid valve regurgitation is trivial. No evidence of tricuspid stenosis. Aortic Valve: The aortic valve is tricuspid. There is mild calcification of the aortic valve. There is mild thickening of the aortic valve. Aortic valve regurgitation is trivial. Aortic valve sclerosis is present, with no evidence of aortic valve stenosis. Pulmonic Valve: The pulmonic valve was normal in structure. Pulmonic valve regurgitation is trivial. No evidence of pulmonic stenosis. Aorta: The aortic root is normal in size and structure. Venous: The inferior vena cava is normal in size with greater than 50% respiratory variability, suggesting right atrial pressure of 3 mmHg. IAS/Shunts: No atrial level shunt detected by color flow Doppler.  LEFT VENTRICLE PLAX 2D LVIDd:         4.60 cm   Diastology LVIDs:         3.40 cm   LV e' medial:    4.66 cm/s LV PW:         1.00 cm   LV E/e' medial:  19.7 LV IVS:        1.30 cm   LV e' lateral:   15.50 cm/s LVOT diam:     2.20 cm   LV E/e' lateral: 5.9 LV SV:         108 LV  SV Index:   56 LVOT Area:     3.80 cm  RIGHT VENTRICLE RV Basal diam:  4.30 cm RV Mid diam:    3.50 cm RV S prime:     21.80 cm/s TAPSE (M-mode): 1.5 cm LEFT ATRIUM             Index        RIGHT ATRIUM           Index LA diam:        4.00 cm 2.06 cm/m   RA Area:     19.00 cm LA Vol (A2C):   71.7 ml 36.97 ml/m  RA Volume:   51.60 ml  26.60 ml/m LA Vol (A4C):   96.7 ml 49.85 ml/m LA Biplane Vol: 87.0 ml 44.85 ml/m  AORTIC VALVE LVOT Vmax:   127.00 cm/s LVOT Vmean:  94.800 cm/s LVOT VTI:    0.285 m  AORTA Ao Root diam: 3.70 cm Ao Asc diam:  3.70 cm MITRAL VALVE               TRICUSPID VALVE MV Area (PHT): 2.55 cm    TR Peak grad:   13.1 mmHg MV Decel Time: 297 msec    TR Vmax:        181.00 cm/s MV E velocity: 92.00 cm/s MV A velocity: 90.30 cm/s  SHUNTS MV E/A ratio:  1.02        Systemic VTI:  0.29 m  Systemic Diam: 2.20 cm Oneil Parchment MD Electronically signed by Oneil Parchment MD Signature Date/Time: 04/16/2024/11:50:06 AM    Final    CT ANGIO HEAD NECK W WO CM Result Date: 04/16/2024 EXAM: CTA HEAD AND NECK WITHOUT AND WITH 04/16/2024 05:41:14 AM TECHNIQUE: CTA of the head and neck was performed without and with the administration of 75 mL of iohexol  (OMNIPAQUE ) 350 MG/ML injection. Multiplanar 2D and/or 3D reformatted images are provided for review. Automated exposure control, iterative reconstruction, and/or weight based adjustment of the mA/kV was utilized to reduce the radiation dose to as low as reasonably achievable. Stenosis of the internal carotid arteries measured using NASCET criteria. COMPARISON: CT of the head dated 04/15/2024 CLINICAL HISTORY: Stroke/TIA, determine embolic source. FINDINGS: CTA NECK: AORTIC ARCH AND ARCH VESSELS: No dissection or arterial injury. No significant stenosis of the brachiocephalic or subclavian arteries. There is calcific atheromatous disease within the aortic arch. CERVICAL CAROTID ARTERIES: No dissection or arterial injury. There is  moderate calcific plaque within the carotid bulbs and origins of the internal carotid arteries bilaterally. There is near occlusive stenosis of the proximal right internal carotid artery with estimated stenosis 90% or greater. There is extensive calcific plaque within the proximal left internal carotid artery with estimated stenosis approximately 60 to 70%. CERVICAL VERTEBRAL ARTERIES: No dissection, arterial injury, or significant stenosis. LUNGS AND MEDIASTINUM: Unremarkable. SOFT TISSUES: No acute abnormality. BONES: No acute abnormality. CTA HEAD: ANTERIOR CIRCULATION: There is moderately advanced calcific atheromatous disease within the carotid siphons. There is moderate stenosis of the cavernous and supraclinoid segments, approximately 40 to 50% bilaterally. No significant stenosis of the anterior cerebral arteries. No significant stenosis of the middle cerebral arteries. No aneurysm. POSTERIOR CIRCULATION: There is moderate-to-severe stenosis of the P2 segment of the left posterior cerebral artery and there is moderate stenosis of the P2 segment of the right posterior cerebral artery. The focal stenoses are demonstrated on images 104 and 106 of series 9, respectively. No significant stenosis of the basilar artery. There is moderately advanced calcific atheromatous disease within the vertebral arteries. No aneurysm. OTHER: There is age-related atrophy and moderately advanced cerebral white matter disease. Chronic encephalomalacia changes are noted within the frontal and parietal lobes bilaterally from remote cortical infarcts. Chronic infarcts are also noted within the cerebellar hemispheres bilaterally. No dural venous sinus thrombosis on this non-dedicated study. IMPRESSION: 1. 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 2. Extensive calcific plaque within the proximal left internal carotid artery with estimated stenosis approximately 60 to  70%. 3. Moderate-to-severe stenosis of the P2 segment of the left posterior cerebral artery and moderate stenosis of the P2 segment of the right posterior cerebral artery. 4. Moderate stenosis of the cavernous and supraclinoid segments, approximately 40 to 50% bilaterally. Electronically signed by: Evalene Coho MD 04/16/2024 06:14 AM EST RP Workstation: [id number]   MR BRAIN WO CONTRAST Result Date: 04/15/2024 EXAM: MRI BRAIN WITHOUT CONTRAST 04/15/2024 07:46:35 PM TECHNIQUE: Multiplanar multisequence MRI of the head/brain was performed without the administration of intravenous contrast. COMPARISON: CT head from earlier today. MRI head 08/30/2016. CLINICAL HISTORY: Neuro deficit, acute, stroke suspected FINDINGS: BRAIN AND VENTRICLES: Punctate acute infarcts in the high right frontal and left parasagittal frontal lobes (series 2, images 39 and 37). No intracranial hemorrhage. No mass. No midline shift. No hydrocephalus. Advanced T2 hyperintensity in the white matter, compatible with chronic avascular ischemic disease. Remote lacunar infarcts in the corona radiata and bilateral parietal cortex. Multiple remote  cerebellar infarcts bilaterally. Normal flow voids. ORBITS: No acute abnormality. SINUSES AND MASTOIDS: No acute abnormality. BONES AND SOFT TISSUES: Normal marrow signal. No acute soft tissue abnormality. IMPRESSION: 1. Punctate acute infarcts in the high right frontal and left parasagittal frontal lobes. 2. 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 Electronically signed by: Gilmore Molt MD 04/15/2024 09:43 PM EST RP Workstation: [id number]   CT HEAD WO CONTRAST Result Date: 04/15/2024 CLINICAL DATA:  Clemens out of bed, neurologic deficit EXAM: CT HEAD WITHOUT CONTRAST TECHNIQUE: Contiguous axial images were obtained from the base of the skull through the vertex without intravenous contrast. RADIATION DOSE REDUCTION: This exam was performed according to  the departmental dose-optimization program which includes automated exposure control, adjustment of the mA and/or kV according to patient size and/or use of iterative reconstruction technique. COMPARISON:  08/31/2016 FINDINGS: Brain: There are chronic cortical infarcts along the bilateral frontal and parietal convexities, as well as within the bilateral cerebellar hemispheres. Chronic small vessel ischemic changes are seen throughout the periventricular and subcortical white matter. No evidence of acute infarct or hemorrhage. Dense bilateral basal ganglia calcifications are again noted. Lateral ventricles and midline structures are otherwise unremarkable. No acute extra-axial fluid collections. No mass effect. Vascular: No hyperdense vessel or unexpected calcification. Skull: Normal. Negative for fracture or focal lesion. Sinuses/Orbits: No acute finding. Other: None. IMPRESSION: 1. No acute intracranial process. 2. Chronic ischemic changes as above. Electronically Signed   By: Ozell Daring M.D.   On: 04/15/2024 17:50   Scheduled Meds:  aspirin   81 mg Oral Daily   atorvastatin   80 mg Oral q1800   clopidogrel   75 mg Oral Daily   feeding supplement  237 mL Oral BID BM   FLUoxetine   20 mg Oral Daily   insulin  aspart  0-9 Units Subcutaneous TID WC   Continuous Infusions:     LOS: 1 day    Time spent: 50 mins    Darcel Dawley, MD [facility]   If 7PM-7AM, please contact night-coverage

## 2024-04-17 NOTE — Plan of Care (Signed)
 Wife came to visit him during day time, didn't had much activity today and is on bed rest.  Problem: Skin Integrity: Goal: Risk for impaired skin integrity will decrease Outcome: Progressing   Problem: Health Behavior/Discharge Planning: Goal: Ability to manage health-related needs will improve Outcome: Progressing   Problem: Safety: Goal: Ability to remain free from injury will improve Outcome: Progressing   Problem: Pain Managment: Goal: General experience of comfort will improve and/or be controlled Outcome: Progressing   Problem: Activity: Goal: Risk for activity intolerance will decrease Outcome: Progressing   Problem: Nutrition: Goal: Adequate nutrition will be maintained Outcome: Progressing   Problem: Education: Goal: Knowledge of patient specific risk factors will improve (DELETE if not current risk factor) Outcome: Progressing   Problem: Education: Goal: Knowledge of secondary prevention will improve (MUST DOCUMENT ALL) Outcome: Progressing

## 2024-04-17 NOTE — Progress Notes (Signed)
 PT Cancellation Note  Patient Details Name: Christopher Roberts MRN: [id number] DOB: 07/10/1945   Cancelled Treatment:    Reason Eval/Treat Not Completed: Patient at procedure or test/unavailable (PT consult appreciated and chart reviewed. Echo in progress in pt's room. Will follow-up for PT evaluation as schedule permits.)  Randall SAUNDERS, PT, DPT Acute Rehabilitation Services Office: [phone] Secure Chat Preferred  [name] 04/17/2024, 2:33 PM

## 2024-04-17 NOTE — Progress Notes (Signed)
° °  Inpatient Rehab Admissions Coordinator :  Per therapy recommendations, patient was screened for CIR candidacy by Heron Leavell RN MSN.  At this time patient appears to be a potential candidate for CIR but note TCAR on 12/15. We will follow up with postop evaluations to assist with planning dispo when appropriate. Please call me with any questions.  Heron Leavell RN MSN Admissions Coordinator [phone]

## 2024-04-17 NOTE — Progress Notes (Addendum)
°  Progress Note    04/17/2024 8:01 AM * No surgery found *  Subjective:  no new symptoms. Feels essentially back to his baseline   Vitals:   04/17/24 0535 04/17/24 0748  BP: 107/62 (!) 105/53  Pulse: 90 73  Resp:  18  Temp:  98.9 F (37.2 C)  SpO2: 99% 100%   Physical Exam: Cardiac:  regular Lungs:  non labored Extremities:  mild weakness of right leg compared to left otherwise moving extremities without deficits Neurologic: alert and oriented. Speech coherent  CBC    Component Value Date/Time   WBC 1.7 (L) 04/17/2024 0259   RBC 3.44 (L) 04/17/2024 0259   HGB 10.6 (L) 04/17/2024 0259   HGB 11.2 (L) 03/17/2024 1215   HGB 10.8 (L) 03/12/2024 1459   HCT 31.4 (L) 04/17/2024 0259   HCT 32.5 (L) 03/12/2024 1459   PLT 49 (L) 04/17/2024 0259   PLT 58 (L) 03/17/2024 1215   PLT 63 (LL) 03/12/2024 1459   MCV 91.3 04/17/2024 0259   MCV 90 03/12/2024 1459   MCH 30.8 04/17/2024 0259   MCHC 33.8 04/17/2024 0259   RDW 19.3 (H) 04/17/2024 0259   RDW 18.5 (H) 03/12/2024 1459   LYMPHSABS 0.9 04/15/2024 1558   LYMPHSABS 1.1 03/12/2024 1459   MONOABS 0.1 04/15/2024 1558   EOSABS 0.0 04/15/2024 1558   EOSABS 0.0 03/12/2024 1459   BASOSABS 0.0 04/15/2024 1558   BASOSABS 0.0 03/12/2024 1459    BMET    Component Value Date/Time   NA 137 04/17/2024 0259   NA 141 03/12/2024 1459   K 3.9 04/17/2024 0259   CL 107 04/17/2024 0259   CO2 23 04/17/2024 0259   GLUCOSE 148 (H) 04/17/2024 0259   BUN 15 04/17/2024 0259   BUN 19 03/12/2024 1459   CREATININE 1.16 04/17/2024 0259   CREATININE 1.07 03/17/2024 1215   CALCIUM  8.1 (L) 04/17/2024 0259   GFRNONAA >60 04/17/2024 0259   GFRNONAA >60 03/17/2024 1215   GFRAA >60 08/31/2016 0443    INR    Component Value Date/Time   INR 1.3 (H) 04/15/2024 1558     Intake/Output Summary (Last 24 hours) at 04/17/2024 0801 Last data filed at 04/16/2024 2301 Gross per 24 hour  Intake 908.6 ml  Output --  Net 908.6 ml      Assessment/Plan:  78 y.o. male with symptomatic bilateral ICA stenosis however left ICA is symptomatic with presenting right lower extremity weakness. He has had no new neurological symptoms. He will need to continue Aspirin , Statin, Plavix . Plan is for Left TCAR in the OR with Dr. Gretta on Monday 04/21/24. I reviewed surgery with patient and answered his questions. We are monitoring his pancytopenia so pending stability of his platelets will determine proceeding on Monday as scheduled  Teretha Damme, PA-C [facility] [phone] 04/17/2024 8:01 AM  I have seen and evaluated the patient. I agree with the PA note as documented above.  Patient admitted yesterday with bilateral ICA stenosis and bilateral frontal infarcts on MRI.  Right leg weakness was his initial presentation so we will plan left carotid revascularization first after discussion with neurology.  Plan left TCAR on Monday.  Needs to continue aspirin  Plavix  statin.  Will monitor pancytopenia.  Lonni DOROTHA Gretta, MD [facility] [place] Office: [phone]

## 2024-04-17 NOTE — TOC CAGE-AID Note (Signed)
 Transition of Care [facility]) - CAGE-AID Screening   Patient Details  Name: Christopher Roberts MRN: [id number] Date of Birth: Jan 28, 1946  Transition of [facility]) CM/SW Contact:    Ailanie Ruttan E Keighan Amezcua, LCSW Phone Number: 04/17/2024, 9:09 AM   Clinical Narrative: No SA noted.   CAGE-AID Screening:    Have You Ever Felt You Ought to Cut Down on Your Drinking or Drug Use?: No Have People Annoyed You By Critizing Your Drinking Or Drug Use?: No Have You Felt Bad Or Guilty About Your Drinking Or Drug Use?: No Have You Ever Had a Drink or Used Drugs First Thing In The Morning to Steady Your Nerves or to Get Rid of a Hangover?: No CAGE-AID Score: 0  Substance Abuse Education Offered: No

## 2024-04-17 NOTE — Progress Notes (Signed)
 PT Cancellation Note  Patient Details Name: TSUTOMU BARFOOT MRN: [id number] DOB: 11-15-1945   Cancelled Treatment:    Reason Eval/Treat Not Completed: Active bedrest order (PT consult appreciated and chart reviewed. Per Neurology note 12/10: Bedrest for 24 hours. Pt with active bedrest order. Per vascular notes, plan for TCAR on Monday 12/15. Will continue efforts as able/as appropriate.)  Randall SAUNDERS, PT, DPT Acute Rehabilitation Services Office: [phone] Secure Chat Preferred  [name] 04/17/2024, 10:50 AM

## 2024-04-17 NOTE — Evaluation (Signed)
 Physical Therapy Evaluation Patient Details Name: Christopher Roberts MRN: [id number] DOB: Sep 19, 1945 Today's Date: 04/17/2024  History of Present Illness  78 y.o. male admitted 04/15/24 s/p fall with RLE weakness. CTA Head/Neck showed right ICA stenosis >90, left ICA stenosis 60-70%. MRI demonstrated acute punctate CVA. Plan for TCAR on Monday 12/15. PMHx: CAD s/p PCI 2022, T2DM, pancytopenia, HTN, DJD, HLD, MI, and CABGx3. Oncologist worked up for possible MDS.   Clinical Impression  Pt admitted with above diagnosis. PTA, pt was modI for functional mobility using RW, independent with ADLs, and modI with most IADLs. He lives with his wife in a two story house with a level entry and 12 steps to the bedroom and full bathroom. Pt currently with functional limitations due to the deficits listed below (see PT Problem List). He required CGA for bed mobility and modA for sit<>stand. Unable to further mobility d/t symptomatic hypotension and pt c/o worsening dizziness. Pt is currently limited by decreased cognition, right hip weakness, decreased standing balance, and impaired activity tolerance. Pt will benefit from acute skilled PT to increase his independence and safety with mobility to allow discharge. Recommend intensive inpatient follow-up therapy, >3 hours/day to help pt regain PLOF in order to safely return home.    04/17/24 1554  Vital Signs  Patient Position (if appropriate) Orthostatic Vitals  Orthostatic Lying   BP- Lying 115/68  Orthostatic Sitting  BP- Sitting (!) 117/99  Orthostatic Standing at 0 minutes  BP- Standing at 0 minutes 121/58         If plan is discharge home, recommend the following: A lot of help with walking and/or transfers;Assistance with cooking/housework;A little help with bathing/dressing/bathroom;Assist for transportation;Help with stairs or ramp for entrance   Can travel by private vehicle        Equipment Recommendations Wheelchair (measurements PT);Wheelchair  cushion (measurements PT);BSC/3in1  Recommendations for Other Services  Rehab consult    Functional Status Assessment Patient has had a recent decline in their functional status and demonstrates the ability to make significant improvements in function in a reasonable and predictable amount of time.     Precautions / Restrictions Precautions Precautions: Fall Recall of Precautions/Restrictions: Impaired Precaution/Restrictions Comments: BP goal 140-160 before carotid revascularization Restrictions Weight Bearing Restrictions Per Provider Order: No      Mobility  Bed Mobility Overal bed mobility: Needs Assistance Bed Mobility: Supine to Sit, Sit to Supine     Supine to sit: HOB elevated, Contact guard Sit to supine: Contact guard assist   General bed mobility comments: Pt sat up on R side of the bed with increased time. He brought BLE off EOB. CGA to elevate chest. Pt scooted fwd til feet flat on floor with BUE support. Returning to bed pt controlled trunk down and swung BLE back in. CGA for safety. Repositioned with +2 assist.    Transfers Overall transfer level: Needs assistance Equipment used: Rolling walker (2 wheels) Transfers: Sit to/from Stand Sit to Stand: Mod assist           General transfer comment: Pt stood from lowest bed height. Cued proper hand placement. Increased fwd lean. Powered up with modA. Poor eccentric control, plopping down on bed.    Ambulation/Gait             Pre-gait activities: Pt engaged in standing weight shifts inside RW with minA. He c/o worsening dizziness. Instructed pt to return to sitting EOB. General Gait Details: Deferred d/t symptomatic hypotension and pt c/o worsening dizziness.  Stairs  Wheelchair Mobility     Tilt Bed    Modified Rankin (Stroke Patients Only) Modified Rankin (Stroke Patients Only) Pre-Morbid Rankin Score: No symptoms Modified Rankin: Moderately severe disability     Balance  Overall balance assessment: Needs assistance Sitting-balance support: Feet supported, No upper extremity supported Sitting balance-Leahy Scale: Good Sitting balance - Comments: Pt sat EOB with supervision. He doffed socks and donned grippy socks bilaterally without LOB.   Standing balance support: Bilateral upper extremity supported, During functional activity, Reliant on assistive device for balance Standing balance-Leahy Scale: Poor Standing balance comment: Pt dependent on RW                             Pertinent Vitals/Pain Pain Assessment Pain Assessment: No/denies pain    Home Living Family/patient expects to be discharged to:: Private residence Living Arrangements: Spouse/significant other Available Help at Discharge: Family;Available 24 hours/day Type of Home: House Home Access: Level entry     Alternate Level Stairs-Number of Steps: 12 Home Layout: Two level;Bed/bath upstairs;1/2 bath on main level Home Equipment: Rolling Walker (2 wheels);Cane - single point;Rollator (4 wheels);Shower seat - built in;Hand held shower head      Prior Function Prior Level of Function : Independent/Modified Independent             Mobility Comments: ModI using RW. About 4 falls in the past 109mo. ADLs Comments: Indep with basic self care. Spouse can help him with medications, prompting him to take it. Drives in the daytime, very seldom. Retired since '05.     Extremity/Trunk Assessment   Upper Extremity Assessment Upper Extremity Assessment: Defer to OT evaluation    Lower Extremity Assessment Lower Extremity Assessment: RLE deficits/detail RLE Deficits / Details: Hip strength 3+/5. Knee and Ankle strength 4/5. RLE Sensation: WNL RLE Coordination: decreased gross motor (Pt with difficulty performing heel-to-shin test and tapping toes for RAMs)    Cervical / Trunk Assessment Cervical / Trunk Assessment: Other exceptions Cervical / Trunk Exceptions: Rounded  Shoulders  Communication   Communication Communication: No apparent difficulties    Cognition Arousal: Alert Behavior During Therapy: WFL for tasks assessed/performed   PT - Cognitive impairments: Orientation, Attention, Awareness, Problem solving, Safety/Judgement   Orientation impairments: Time (Pt unable to recall the year initially stating 1949, 19...no 20, then filling it in with 2015. Reoriented to 2025)                   PT - Cognition Comments: Pt A,Ox3. He was easily distracted frequently focusing on the TV during session. Re-direction to focus on task. Pt with decreased insight delaying to report symptoms of dizziness. His spouse corrected him on some home set-up information. Following commands: Intact       Cueing Cueing Techniques: Verbal cues, Gestural cues     General Comments General comments (skin integrity, edema, etc.): Pt c/o dizziness upon standing. Assessed BP: standing 121/58 (76), sitting 117/99 (106), and supine 115/68.    Exercises     Assessment/Plan    PT Assessment Patient needs continued PT services  PT Problem List Decreased strength;Decreased activity tolerance;Decreased balance;Decreased mobility;Decreased coordination;Decreased cognition;Decreased knowledge of use of DME;Decreased safety awareness;Cardiopulmonary status limiting activity       PT Treatment Interventions DME instruction;Gait training;Stair training;Functional mobility training;Therapeutic activities;Therapeutic exercise;Balance training;Cognitive remediation;Patient/family education    PT Goals (Current goals can be found in the Care Plan section)  Acute Rehab PT Goals Patient Stated Goal: Return Home  PT Goal Formulation: With patient/family Time For Goal Achievement: 05/01/24 Potential to Achieve Goals: Good    Frequency Min 3X/week     Co-evaluation               AM-PAC PT 6 Clicks Mobility  Outcome Measure Help needed turning from your back to your  side while in a flat bed without using bedrails?: A Little Help needed moving from lying on your back to sitting on the side of a flat bed without using bedrails?: A Little Help needed moving to and from a bed to a chair (including a wheelchair)?: A Lot Help needed standing up from a chair using your arms (e.g., wheelchair or bedside chair)?: A Lot Help needed to walk in hospital room?: Total Help needed climbing 3-5 steps with a railing? : Total 6 Click Score: 12    End of Session Equipment Utilized During Treatment: Gait belt Activity Tolerance: Treatment limited secondary to medical complications (Comment) (symptomatic hypotension with pt c/o dizziness) Patient left: in bed;with call bell/phone within reach;with bed alarm set;with family/visitor present Nurse Communication: Mobility status;Other (comment) (BP response during session) PT Visit Diagnosis: Difficulty in walking, not elsewhere classified (R26.2);Other abnormalities of gait and mobility (R26.89);Unsteadiness on feet (R26.81)    Time: 8478-8450 PT Time Calculation (min) (ACUTE ONLY): 28 min   Charges:   PT Evaluation $PT Eval Moderate Complexity: 1 Mod   PT General Charges $$ ACUTE PT VISIT: 1 Visit         Randall SAUNDERS, PT, DPT Acute Rehabilitation Services Office: [phone] Secure Chat Preferred  Christopher Roberts 04/17/2024, 4:10 PM

## 2024-04-17 NOTE — Progress Notes (Signed)
 Pt is stable hemodynamically, afebrile, NSR on the monitor,on room air, normal respiratory effort. No acute distress. We check his neuro signs q 4 hrs, no changes from previous shift as documented below. Plan of acre is reviewed. We will continue to monitor.    04/17/24 0400  Neurological  Neuro (WDL) WDL  Orientation Level Oriented X4  Cognition Appropriate at baseline;Appropriate attention/concentration;Appropriate judgement;Appropriate safety awareness;Appropriate for developmental age;Follows commands;No memory impairment  Speech Clear;Appropriate at baseline;Appropriate for developmental age  R Pupil Size (mm) 3  R Pupil Shape Round  R Pupil Reaction Brisk  L Pupil Size (mm) 3  L Pupil Shape Round  L Pupil Reaction Brisk  Motor Function/Sensation Assessment Grip;Head;Motor strength;Motor response;Sensation;Leg ataxia;Dorsiflexion;Plantar flexion;Arm ataxia;Pronator drift;Elbow flexion;Elbow extension  Facial Symmetry Symmetrical  R Hand Grip Strong  L Hand Grip Strong  R Elbow Extension (Push/Biceps) Moderate  L Elbow Extension (Push/Biceps) Strong  R Elbow Flexion (Pull/Triceps) Moderate  L Elbow Flexion (Pull/Triceps) Strong  Right Pronator Drift Absent  Left Pronator Drift Absent  R Foot Dorsiflexion Moderate  L Foot Dorsiflexion Strong  R Foot Plantar Flexion Moderate  L Foot Plantar Flexion Strong  R Finger to Nose (Point to [employer]) Smooth  L Finger to Nose (Point to [employer]) Smooth  R Heel to [employer] (Point to [employer]) Smooth  L Heel to [employer] to [employer]) Smooth  RUE Motor Response Purposeful movement  RUE Sensation Full sensation  RUE Motor Strength 5  LUE Motor Response Purposeful movement  LUE Sensation Full sensation  LUE Motor Strength 5  RLE Motor Response Purposeful movement  RLE Sensation Full sensation  RLE Motor Strength 4  LLE Motor Response Purposeful movement  LLE Sensation Full sensation  LLE Motor Strength 5  Neuro Symptoms Fatigue  Neuro symptoms  relieved by Rest;Relaxation techniques (Comment)  Neuro Additional Assessments Glasgow Coma Scale;NIH stroke scale  Glasgow Coma Scale  Eye Opening 4  Best Verbal Response (NON-intubated) 5  Best Motor Response 6  Glasgow Coma Scale Score 15  NIH Stroke Scale   Dizziness Present No  Headache Present No  Interval Shift assessment  Level of Consciousness (1a.)    0  LOC Questions (1b. )    0  LOC Commands (1c. )    0  Best Gaze (2. )   0  Visual (3. )   0  Facial Palsy (4. )     0  Motor Arm, Left (5a. )    0  Motor Arm, Right (5b. )  0  Motor Leg, Left (6a. )   0  Motor Leg, Right (6b. )  1  Limb Ataxia (7. ) 0  Sensory (8. )   0  Best Language (9. )   0  Dysarthria (10. ) 0  Extinction/Inattention (11.)    0  Complete NIHSS TOTAL 1  Neurological  Level of Consciousness Alert    Wendi Dash, RN

## 2024-04-17 NOTE — TOC Initial Note (Signed)
 Transition of Care [facility]) - Initial/Assessment Note    Patient Details  Name: Christopher Roberts MRN: [id number] Date of Birth: 1945-11-23  Transition of Care [facility]) CM/SW Contact:    Andrez JULIANNA George, RN Phone Number: 04/17/2024, 10:47 AM  Clinical Narrative:                 Christopher Roberts is a 78 y.o. male with history of CAD status post PCI in 2022, diabetes mellitus type 2, pancytopenia being followed by oncologist and being worked up for possible MDS, hypertension had a fall. He lost balance and fell onto the floor and was on the floor for almost an hour when his family member found him.  Pt is from home with his spouse. They are together most of the time.  Pt drives some during the daytime but spouse provides most of the transportation. Pt manages his own medications with wife reminders.   Awaiting therapy evals. IP Care management following.  Expected Discharge Plan:  (TBD) Barriers to Discharge: Continued Medical Work up   Patient Goals and CMS Choice            Expected Discharge Plan and Services   Discharge Planning Services: CM Consult   Living arrangements for the past 2 months: Single Family Home                                      Prior Living Arrangements/Services Living arrangements for the past 2 months: Single Family Home Lives with:: Spouse Patient language and need for interpreter reviewed:: Yes Do you feel safe going back to the place where you live?: Yes        Care giver support system in place?: Yes (comment) Current home services: DME (cane/ walker/ shower seat/ wheelchair) Criminal Activity/Legal Involvement Pertinent to Current Situation/Hospitalization: No - Comment as needed  Activities of Daily Living   ADL Screening (condition at time of admission) Independently performs ADLs?: Yes (appropriate for developmental age) Is the patient deaf or have difficulty hearing?: No Does the patient have difficulty seeing, even when wearing  glasses/contacts?: No Does the patient have difficulty concentrating, remembering, or making decisions?: Yes  Permission Sought/Granted                  Emotional Assessment Appearance:: Appears stated age Attitude/Demeanor/Rapport: Engaged Affect (typically observed): Accepting Orientation: : Oriented to Self, Oriented to Place, Oriented to  Time, Oriented to Situation   Psych Involvement: No (comment)  Admission diagnosis:  Acute ischemic stroke (HCC) [I63.9] Acute CVA (cerebrovascular accident) [facility]) [I63.9] Patient Active Problem List   Diagnosis Date Noted   Acute CVA (cerebrovascular accident) (HCC) 04/15/2024   Other pancytopenia (HCC) 03/17/2024   Cirrhosis of liver (HCC) 03/12/2024   Weight loss 03/12/2024   Arthralgia of right knee 04/12/2022   Pain in joint of right shoulder 04/12/2022   Stiffness of right shoulder joint 04/12/2022   Major depressive disorder, recurrent, moderate (HCC) 01/27/2022   Generalized anxiety disorder 01/27/2022   Arthralgia of right foot 12/10/2020   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   Depression 07/27/2020   STEMI involving right coronary artery (HCC) 07/25/2020   ST elevation myocardial infarction involving right coronary artery (HCC) 07/25/2020   Educated about COVID-19 virus infection 11/04/2019   Vertigo    Hypertensive urgency 08/30/2016   Diabetes mellitus (HCC) 08/30/2016   Nonspecific abnormal results  of cardiovascular function study 10/25/2010   Shortness of breath 10/18/2010   Murmur 10/18/2010   Tobacco abuse 10/18/2010   History of colonic polyps 03/21/2010   Hyperlipidemia 10/13/2008   Essential hypertension 10/13/2008   Coronary artery disease due to lipid rich plaque 10/13/2008   CHEST PAIN-UNSPECIFIED 10/13/2008   CHEST PAIN-PRECORDIAL 10/13/2008   PCP:  [name], [name], MD Pharmacy:   [facility] [phone] [place], [place] - [address]  [address] [place] [address] Phone: [phone] Fax: [phone]     Social Drivers of Health (SDOH) Social History: SDOH Screenings   Food Insecurity: No Food Insecurity (04/16/2024)  Recent Concern: Food Insecurity - Food Insecurity Present (03/17/2024)  Housing: Low Risk (04/16/2024)  Transportation Needs: No Transportation Needs (04/16/2024)  Utilities: Not At Risk (04/16/2024)  Recent Concern: Utilities - At Risk (03/17/2024)  Alcohol Screen: Low Risk (03/12/2024)  Depression (PHQ2-9): High Risk (03/17/2024)  Financial Resource Strain: Low Risk (03/12/2024)  Physical Activity: Inactive (03/12/2024)  Social Connections: Moderately Isolated (04/16/2024)  Stress: No Stress Concern Present (03/12/2024)  Tobacco Use: Medium Risk (04/15/2024)  Health Literacy: Adequate Health Literacy (03/12/2024)   SDOH Interventions:     Readmission Risk Interventions     No data to display

## 2024-04-18 LAB — BASIC METABOLIC PANEL WITH GFR
Anion gap: 6 (ref 5–15)
BUN: 18 mg/dL (ref 8–23)
CO2: 21 mmol/L — ABNORMAL LOW (ref 22–32)
Calcium: 7.4 mg/dL — ABNORMAL LOW (ref 8.9–10.3)
Chloride: 107 mmol/L (ref 98–111)
Creatinine, Ser: 1.22 mg/dL (ref 0.61–1.24)
GFR, Estimated: >60 mL/min (ref >60)
Glucose, Bld: 122 mg/dL — ABNORMAL HIGH (ref 70–99)
Potassium: 3.4 mmol/L — ABNORMAL LOW (ref 3.5–5.1)
Sodium: 134 mmol/L — ABNORMAL LOW (ref 135–145)

## 2024-04-18 LAB — GLUCOSE, CAPILLARY
Glucose-Capillary: 111 mg/dL — ABNORMAL HIGH (ref 70–99)
Glucose-Capillary: 144 mg/dL — ABNORMAL HIGH (ref 70–99)
Glucose-Capillary: 233 mg/dL — ABNORMAL HIGH (ref 70–99)
Glucose-Capillary: 173 mg/dL — ABNORMAL HIGH (ref 70–99)

## 2024-04-18 LAB — CBC
HCT: 23.3 % — ABNORMAL LOW (ref 39.0–52.0)
Hemoglobin: 8 g/dL — ABNORMAL LOW (ref 13.0–17.0)
MCH: 30.3 pg (ref 26.0–34.0)
MCHC: 34.3 g/dL (ref 30.0–36.0)
MCV: 88.3 fL (ref 80.0–100.0)
Platelets: 42 K/uL — ABNORMAL LOW (ref 150–400)
RBC: 2.64 MIL/uL — ABNORMAL LOW (ref 4.22–5.81)
RDW: 19.6 % — ABNORMAL HIGH (ref 11.5–15.5)
WBC: 2.1 K/uL — ABNORMAL LOW (ref 4.0–10.5)
nRBC: 0 % (ref 0.0–0.2)

## 2024-04-18 LAB — PHOSPHORUS: Phosphorus: 3.1 mg/dL (ref 2.5–4.6)

## 2024-04-18 LAB — MAGNESIUM: Magnesium: 1.9 mg/dL (ref 1.7–2.4)

## 2024-04-18 MED ORDER — MIDODRINE HCL 5 MG PO TABS
10.0000 mg | ORAL_TABLET | Freq: Three times a day (TID) | ORAL | Status: DC
  Administered 2024-04-18 – 2024-04-20 (×7): 10 mg via ORAL
  Filled 2024-04-18 (×7): qty 2

## 2024-04-18 MED ORDER — SODIUM CHLORIDE 0.9 % IV SOLN: INTRAVENOUS | Status: AC

## 2024-04-18 MED ORDER — POTASSIUM CHLORIDE CRYS ER 20 MEQ PO TBCR
40.0000 meq | EXTENDED_RELEASE_TABLET | Freq: Once | ORAL | Status: AC
  Administered 2024-04-18: 40 meq via ORAL
  Filled 2024-04-18: qty 2

## 2024-04-18 MED ORDER — SODIUM CHLORIDE 0.9 % IV BOLUS
1000.0000 mL | Freq: Once | INTRAVENOUS | Status: AC
  Administered 2024-04-18: 1000 mL via INTRAVENOUS

## 2024-04-18 MED ORDER — ALBUMIN HUMAN 5 % IV SOLN
12.5000 g | Freq: Four times a day (QID) | INTRAVENOUS | Status: AC
  Administered 2024-04-18 – 2024-04-19 (×4): 12.5 g via INTRAVENOUS
  Filled 2024-04-18 (×4): qty 250

## 2024-04-18 NOTE — Plan of Care (Signed)
 PT reported pt with poor RLE control and weakness at [facility]. Repeated strength, sensation, and coordination testing unchanged from PT Eval. Pt quickly fatiguing and with difficultly moving RLE during mobility. Transferred to recliner chair and pt dragged RLE across the ground despite being able to perform seated RLE exercises including marches, LAQs, heel raises, and toe raises. BP was lying 105/53, sitting 93/56, standing 92/71.   Pt has similar episode of RLE worsening strength several days ago due to low BP, put on bedrest and HOB flat for a day. Second day his RLE weakness resolved but still has some ataxia.   Today's change again likely due to hypotension. He is on IVF, will put back to bedrest, and HOB flat, will start midodrine 10mg  tid. His Albumin low, will give 5% albumin 12.5g Q6h x 4 dose. Pending Monday TCAR with Dr. Gretta. Recommend remain HOB and bedrest until carotid intervention. Case discussed with Dr. Leotis.  Ary Cummins, MD PhD Stroke Neurology 04/18/2024 3:37 PM

## 2024-04-18 NOTE — Progress Notes (Signed)
 [facility]  Subjective  -no new stroke symptoms   Objective 113/69 61 98 F (36.7 C) (Oral) 19 100%  Intake/Output Summary (Last 24 hours) at 04/18/2024 0844 Last data filed at 04/18/2024 0750 Gross per 24 hour  Intake 240 ml  Output 700 ml  Net -460 ml      Laboratory Lab Results: Recent Labs    04/17/24 0259 04/18/24 0225  WBC 1.7* 2.1*  HGB 10.6* 8.0*  HCT 31.4* 23.3*  PLT 49* 42*   BMET Recent Labs    04/17/24 0259 04/18/24 0225  NA 137 134*  K 3.9 3.4*  CL 107 107  CO2 23 21*  GLUCOSE 148* 122*  BUN 15 18  CREATININE 1.16 1.22  CALCIUM  8.1* 7.4*    COAG Lab Results  Component Value Date   INR 1.3 (H) 04/15/2024   INR 1.1 07/25/2020   INR 1.1 (H) 10/25/2010   No results found for: PTT  Assessment/Planning:  Patient admitted with bilateral ICA stenosis and bilateral frontal infarcts on MRI. Right leg weakness was his initial presentation so we will plan left carotid revascularization first after discussion with neurology. Plan left TCAR on Monday. Needs to continue aspirin  Plavix  statin. Will monitor pancytopenia.   Lonni JINNY Gaskins 04/18/2024 8:44 AM --

## 2024-04-18 NOTE — Evaluation (Signed)
 Occupational Therapy Evaluation Patient Details Name: Christopher Roberts MRN: [id number] DOB: [birth year] Today's Date: 04/18/2024   History of Present Illness   78 y.o. male admitted 04/15/24 s/p fall with RLE weakness. CTA Head/Neck showed right ICA stenosis >90, left ICA stenosis 60-70%. MRI demonstrated acute punctate CVA. Plan for TCAR on Monday 12/15. PMHx: CAD s/p PCI 2022, T2DM, pancytopenia, HTN, DJD, HLD, MI, and CABGx3. Oncologist worked up for possible MDS.     Clinical Impressions Pt resting comfortably in bed, no complaints. Pt lives at home with wife who can assist as needed, PLOF mod I with cane or RW. Pt BP sitting EOB 91/59, with mild dizziness. Pt attempted to stand, mod A for STS, poor RLE control with weakness, sat back down due to dizziness, BP 92/55. Pt able to complete 2 more stands and transfers to recliner with mild dizziness, increased time to assist RLE with steps. Pt has good overall BUE strength to complete UB ADLs as needed with set up. Pt tolerated session well, eager to improve. Recommending postacute intensive rehab >3hrs/day to maximize functional strength and balance to allow for quick return home with wife. Will continue to see acutely to progress as able.      If plan is discharge home, recommend the following:   A lot of help with walking and/or transfers;A little help with bathing/dressing/bathroom;Assistance with cooking/housework;Assist for transportation;Help with stairs or ramp for entrance     Functional Status Assessment   Patient has had a recent decline in their functional status and demonstrates the ability to make significant improvements in function in a reasonable and predictable amount of time.     Equipment Recommendations   Other (comment) (defer)     Recommendations for Other Services   Rehab consult     Precautions/Restrictions   Precautions Precautions: Fall Recall of Precautions/Restrictions:  Impaired Precaution/Restrictions Comments: BP goal 140-160 before carotid revascularization Restrictions Weight Bearing Restrictions Per Provider Order: No     Mobility Bed Mobility Overal bed mobility: Needs Assistance Bed Mobility: Supine to Sit, Sit to Supine     Supine to sit: Min assist Sit to supine: Min assist   General bed mobility comments: min A to assist RLE.    Transfers Overall transfer level: Needs assistance Equipment used: Rolling walker (2 wheels) Transfers: Sit to/from Stand, Bed to chair/wheelchair/BSC Sit to Stand: Mod assist     Step pivot transfers: Mod assist     General transfer comment: mod A for STS and step pivot to recliner, RLE does not move well, or provide much support      Balance Overall balance assessment: Needs assistance Sitting-balance support: No upper extremity supported, Feet supported Sitting balance-Leahy Scale: Good     Standing balance support: Bilateral upper extremity supported, During functional activity Standing balance-Leahy Scale: Poor Standing balance comment: reliant on RW for support                           ADL either performed or assessed with clinical judgement   ADL Overall ADL's : Needs assistance/impaired Eating/Feeding: Independent   Grooming: Set up;Sitting   Upper Body Bathing: Set up;Sitting   Lower Body Bathing: Moderate assistance;Sitting/lateral leans   Upper Body Dressing : Set up;Sitting   Lower Body Dressing: Moderate assistance;Sitting/lateral leans;Sit to/from stand   Toilet Transfer: Moderate assistance;Rolling walker (2 wheels);BSC/3in1   Toileting- Clothing Manipulation and Hygiene: Minimal assistance;Sitting/lateral lean         General ADL  Comments: Pt mod A for STS, RLE weakness limits balance, not able to lift and move it during transfer without increased time. Pt with good BUE strength/ROM.     Vision Baseline Vision/History: 1 Wears glasses;0 No visual  deficits Ability to See in Adequate Light: 0 Adequate Patient Visual Report: No change from baseline       Perception         Praxis         Pertinent Vitals/Pain Pain Assessment Pain Assessment: No/denies pain     Extremity/Trunk Assessment Upper Extremity Assessment Upper Extremity Assessment: Overall WFL for tasks assessed           Communication Communication Communication: No apparent difficulties   Cognition Arousal: Alert Behavior During Therapy: WFL for tasks assessed/performed Cognition: Cognition impaired   Orientation impairments: Time         OT - Cognition Comments: Pt grossly A/O, not oriented to what year it is. Pt pleasant and conversational                 Following commands: Intact       Cueing  General Comments   Cueing Techniques: Verbal cues;Gestural cues  BP sitting 91/59, SStood up for a few sconds then sat again, BP 92/55. Pt states increased dizziness when standing.   Exercises     Shoulder Instructions      Home Living Family/patient expects to be discharged to:: Private residence Living Arrangements: Spouse/significant other Available Help at Discharge: Family;Available 24 hours/day Type of Home: House Home Access: Level entry     Home Layout: Two level;Bed/bath upstairs;1/2 bath on main level Alternate Level Stairs-Number of Steps: 12 Alternate Level Stairs-Rails: Left Bathroom Shower/Tub: Producer, Television/film/video: Standard Bathroom Accessibility: Yes How Accessible: Accessible via walker Home Equipment: Rolling Walker (2 wheels);Cane - single point;Rollator (4 wheels);Shower seat - built in;Hand held shower head   Additional Comments: Pt lives with wife who is able to assist as needed. Pt has granddaughters who live there too who can help  Lives With: Spouse;Family    Prior Functioning/Environment Prior Level of Function : Independent/Modified Independent             Mobility Comments: Mod I  using cane, sometimes, RW. About 4 falls in the past 23mo. ADLs Comments: Indep with basic self care. Spouse can help him with medications, prompting him to take it. Drives in the daytime, very seldom. Retired since '05.    OT Problem List: Decreased strength;Decreased range of motion;Decreased activity tolerance;Impaired balance (sitting and/or standing);Decreased cognition;Impaired tone;Impaired sensation   OT Treatment/Interventions: Self-care/ADL training;Therapeutic exercise;Neuromuscular education;Energy conservation;DME and/or AE instruction;Therapeutic activities;Patient/family education;Balance training      OT Goals(Current goals can be found in the care plan section)   Acute Rehab OT Goals Patient Stated Goal: to improve balance OT Goal Formulation: With patient/family Time For Goal Achievement: 05/02/24 Potential to Achieve Goals: Good   OT Frequency:  Min 2X/week    Co-evaluation              AM-PAC OT 6 Clicks Daily Activity     Outcome Measure Help from another person eating meals?: None Help from another person taking care of personal grooming?: A Little Help from another person toileting, which includes using toliet, bedpan, or urinal?: A Little Help from another person bathing (including washing, rinsing, drying)?: A Lot Help from another person to put on and taking off regular upper body clothing?: A Little Help from another person to put  on and taking off regular lower body clothing?: A Lot 6 Click Score: 17   End of Session Equipment Utilized During Treatment: Gait belt;Rolling walker (2 wheels) Nurse Communication: Mobility status  Activity Tolerance: Patient tolerated treatment well Patient left: in bed;with call bell/phone within reach;with family/visitor present  OT Visit Diagnosis: Unsteadiness on feet (R26.81);Other abnormalities of gait and mobility (R26.89);Muscle weakness (generalized) (M62.81);Hemiplegia and hemiparesis Hemiplegia -  Right/Left: Right                Time: 8677-8643 OT Time Calculation (min): 34 min Charges:  OT General Charges $OT Visit: 1 Visit OT Evaluation $OT Eval Moderate Complexity: 1 Mod OT Treatments $Self Care/Home Management : 8-22 mins  [address], OTR/L   Elouise JONELLE Bott 04/18/2024, 2:07 PM

## 2024-04-18 NOTE — Progress Notes (Signed)
 Physical Therapy Treatment Patient Details Name: Christopher Roberts MRN: [id number] DOB: 08-29-1945 Today's Date: 04/18/2024   History of Present Illness 78 y.o. male admitted 04/15/24 s/p fall with RLE weakness. CTA Head/Neck showed right ICA stenosis >90, left ICA stenosis 60-70%. MRI demonstrated acute punctate CVA. Plan for TCAR on Monday 12/15. PMHx: CAD s/p PCI 2022, T2DM, pancytopenia, HTN, DJD, HLD, MI, and CABGx3. Oncologist worked up for possible MDS.    PT Comments  Pt greeted supine in bed, pleasant and agreeable to PT session. He reported his RLE is not working for him. Reassessed strength, sensation, and coordination with no change in testing from PT eval. Pt continues to demonstrate 3+/5 hip strength, 4/5 knee and ankle strength, WNL sensation, and decreased gross motor coordination with difficulty performing heel-to-shin test with RLE and tapping R foot during rapid alternating movements. Pt transferred to recliner chair using RW with modA. He drug his RLE across the ground and heavily relied on BUE support on RW. Pt quickly fatigued in standing c/o weakness. He completed seated BLE exercises including marches, LAQs, heel raises, and toe raises. Updated treatment team on pt's presentation. Neurology instructed PT to put pt back to bed with HOB flat. Returned to pt's room to transfer pt from recliner chair to bed with HOB flat. Will continue to follow acutely and advance appropriately.    04/18/24 1500  Orthostatic Lying   BP- Lying 105/53  Pulse- Lying 71  Orthostatic Sitting  BP- Sitting 93/56  Pulse- Sitting 80  Orthostatic Standing at 0 minutes  BP- Standing at 0 minutes 92/71  Pulse- Standing at 0 minutes 63  Orthostatic Standing at 3 minutes  BP- Standing at 3 minutes  (NT - Pt unable to maintain static stance d/t weakness)       If plan is discharge home, recommend the following: A lot of help with walking and/or transfers;Assistance with cooking/housework;A little help  with bathing/dressing/bathroom;Assist for transportation;Help with stairs or ramp for entrance   Can travel by private vehicle        Equipment Recommendations  Wheelchair (measurements PT);Wheelchair cushion (measurements PT);BSC/3in1    Recommendations for Other Services       Precautions / Restrictions Precautions Precautions: Fall Recall of Precautions/Restrictions: Impaired Precaution/Restrictions Comments: BP goal 140-160 before carotid revascularization Restrictions Weight Bearing Restrictions Per Provider Order: No     Mobility  Bed Mobility Overal bed mobility: Needs Assistance Bed Mobility: Supine to Sit, Sit to Supine     Supine to sit: Min assist, HOB elevated, Used rails Sit to supine: Min assist   General bed mobility comments: Pt sat up on L side of bed with increased time. He brought LLE towards EOB. Assist to bring RLE towards EOB. Pt pulled on bed rail to elevate chest. Scooted fwd with use of bed pad. PT returned pt to bed with HOB flat per Neurology.    Transfers Overall transfer level: Needs assistance Equipment used: Rolling walker (2 wheels) Transfers: Sit to/from Stand, Bed to chair/wheelchair/BSC Sit to Stand: Mod assist   Step pivot transfers: Mod assist       General transfer comment: Pt stood from lowest bed height. Cued proper hand placement using RW. Powered up with modA. Transferred to recliner chair on his left. PT returned pt to bed with pt holding onto therapist instead of AD.    Ambulation/Gait Ambulation/Gait assistance: Mod assist Gait Distance (Feet): 2 Feet Assistive device: Rolling walker (2 wheels) Gait Pattern/deviations: Step-to pattern, Decreased weight shift to right,  Decreased stance time - right, Decreased dorsiflexion - right       General Gait Details: Pt took short slow steps to recliner chair. He dragged RLE across the ground reporting inability to lift it, despite strength testing showing ~3-4/5 RLE. Pt c/o  heaviness.   Stairs             Wheelchair Mobility     Tilt Bed    Modified Rankin (Stroke Patients Only) Modified Rankin (Stroke Patients Only) Pre-Morbid Rankin Score: No symptoms Modified Rankin: Moderately severe disability     Balance Overall balance assessment: Needs assistance Sitting-balance support: No upper extremity supported, Feet supported Sitting balance-Leahy Scale: Good Sitting balance - Comments: Pt sat EOB with close supervision.   Standing balance support: Bilateral upper extremity supported, During functional activity Standing balance-Leahy Scale: Poor Standing balance comment: Pt dependent on RW.                            Communication Communication Communication: No apparent difficulties  Cognition Arousal: Alert Behavior During Therapy: WFL for tasks assessed/performed   PT - Cognitive impairments: Orientation, Attention, Awareness, Problem solving, Safety/Judgement   Orientation impairments: Time                   PT - Cognition Comments: Pt took increased time to participate in session. Possible decresed attention to RLE, pt attempting to leave the leg in bed when moving and stopping doing exercise intermittently until prompted/cued. Following commands: Intact      Cueing Cueing Techniques: Verbal cues, Gestural cues  Exercises General Exercises - Lower Extremity Long Arc Quad: Seated, Both, AROM, 10 reps Hip Flexion/Marching: Seated, Both, AROM, 10 reps Toe Raises: Seated, Both, AROM, 10 reps Heel Raises: Seated, Both, AROM, 10 reps    General Comments General comments (skin integrity, edema, etc.): Orthostatic vitals taken, (-). Pt hypotensive but asymptomatic. He denies dizziness or lightheadeness, c/o feeling very weak. Updated treatment team on pt's performance and changes from evaluation. Neurology instructed PT to return pt to bed with [facility] flat with plans for bedrest until carotid intervention. Wife now  present and upset, wanting to speak to provider.      Pertinent Vitals/Pain Pain Assessment Pain Assessment: No/denies pain    Home Living Family/patient expects to be discharged to:: Private residence Living Arrangements: Spouse/significant other Available Help at Discharge: Family;Available 24 hours/day Type of Home: House Home Access: Level entry     Alternate Level Stairs-Number of Steps: 12 Home Layout: Two level;Bed/bath upstairs;1/2 bath on main level Home Equipment: Rolling Walker (2 wheels);Cane - single point;Rollator (4 wheels);Shower seat - built in;Hand held shower head Additional Comments: Pt lives with wife who is able to assist as needed. Pt has granddaughters who live there too who can help    Prior Function            PT Goals (current goals can now be found in the care plan section) Acute Rehab PT Goals Patient Stated Goal: Get stronger. PT Goal Formulation: With patient/family Time For Goal Achievement: 05/01/24 Potential to Achieve Goals: Good Progress towards PT goals: Progressing toward goals    Frequency    Min 3X/week      PT Plan      Co-evaluation              AM-PAC PT 6 Clicks Mobility   Outcome Measure  Help needed turning from your back to your side while in  a flat bed without using bedrails?: A Little Help needed moving from lying on your back to sitting on the side of a flat bed without using bedrails?: A Little Help needed moving to and from a bed to a chair (including a wheelchair)?: A Lot Help needed standing up from a chair using your arms (e.g., wheelchair or bedside chair)?: A Lot Help needed to walk in hospital room?: Total Help needed climbing 3-5 steps with a railing? : Total 6 Click Score: 12    End of Session Equipment Utilized During Treatment: Gait belt Activity Tolerance: Patient tolerated treatment well;Treatment limited secondary to medical complications (Comment) (hypotension) Patient left: in bed;with  call bell/phone within reach;with bed alarm set Nurse Communication: Mobility status;Other (comment) (BP and RLE changes compared to eval) PT Visit Diagnosis: Difficulty in walking, not elsewhere classified (R26.2);Other abnormalities of gait and mobility (R26.89);Unsteadiness on feet (R26.81)     Time: 8548-8484; 8469-8462 PT Time Calculation (min) (ACUTE ONLY): 34 min  Charges:    $Therapeutic Exercise: 8-22 mins $Therapeutic Activity: 8-22 mins PT General Charges $$ ACUTE PT VISIT: 1 Visit                     Randall SAUNDERS, PT, DPT Acute Rehabilitation Services Office: [phone] Secure Chat Preferred  Christopher Roberts 04/18/2024, 3:50 PM

## 2024-04-18 NOTE — Progress Notes (Signed)
 PROGRESS NOTE    Christopher Roberts  FMW:[id number] DOB: 1945-11-18 DOA: 04/15/2024 PCP: [name], [name], MD   Brief Narrative:  This 78 yrs old Male with PMH significant for CAD status post PCI in 2022, diabetes mellitus type 2, pancytopenia following up with Dr. Autumn oncologist and being worked up for possible MDS, hypertension who had a fall today morning when he tried to get up from the bed around 10 AM.  Patient lost balance and fell onto the floor and was on the floor for almost an hour when his family member found him.  Later noticed to have right lower extremity weakness and was brought in the ED.  EMS noted that his right leg was flaccid.   MRI brain shows features concerning for acute punctate stroke. Patient's right lower extremity slowly improving and is able to move at this time and his strength is around 3/ 5.  Neurologist was consulted and recommended stroke workup for acute stroke.  Patient was admitted for further evaluation.   Assessment & Plan:   Principal Problem:   Acute CVA (cerebrovascular accident) [facility]) Active Problems:   Hyperlipidemia   Essential hypertension   Coronary artery disease due to lipid rich plaque   Diabetes mellitus (HCC)   Cirrhosis of liver (HCC)  Acute CVA: R punctate MCA/ACA infarcts Patient presented with RLE weakness, MRI shows acute punctate stroke.   Patient was evaluated by Neurology , Appreciate neurology consult and recommendations.   Continue aspirin , Plavix  and statins.   Frequent Neurochecks.  Patient passed stroke swallow screen. Dysphagia III diet. CTA Head/ Neck showed right ICA stenosis > 90, left ICA stenosis  60 to 70% 2D echo shows LVEF 60 to 65%, LA moderately dilated. LDL 78 Near goal.  Hb A1c 5.9.  Drug screen negative. PT and OT evaluation.  Aggressive risk factor modification. Vascular surgery consulted for bilateral carotid artery stenosis.  Carotid duplex ordered.  Bilateral carotid artery stenosis: Vascular surgery  is consulted.  Carotid duplex ordered. Although right stenosis is more significant however left stenosis more symptomatic,   Vascular surgery consulted appreciate Dr. Gaylene assistance.  Plan for Left TCAR on 04/21/2024.  Essential hypertension: Given acute stroke , will allow for permissive hypertension.   hold antihypertensives.  History of CAD status post PCI: He  denies chest pain. Continue antiplatelet and statins, hold beta-blockers.  Pancytopenia :  Patient being followed by oncologist,  has plans for bone marrow biopsy next week to rule out MDS. Hematology/ oncology consulted. Hematology recommended to keep platelet above 50K for procedure and hemoglobin above 7 and to transfuse as needed.  History of SVT : He is being followed by cardiologist. HR controlled.  Diabetes mellitus type 2:  Last hemoglobin A1c was 5.9, He takes metformin  at home,  presently on sliding scale coverage.  Cirrhosis of liver : It was recently noted in oncology notes.  Closely monitor.  Further workup as outpatient.   Note that patient is receiving fluids at this time due to low normal blood pressure.  Depression Continue fluoxetine .   DVT prophylaxis: SCDs Code Status: Full code Family Communication:  No family at bed side. Disposition Plan:  Admitted for CVA , Workup in progress. Oncology is consulted for pancytopenia. Vascular surgery is consulted for bilateral carotid artery stenosis. Scheduled for Left TCAR on Monday.   Consultants:  Neurology Oncology Vascular surgery  Procedures: CT, MRI  Antimicrobials:  Anti-infectives (From admission, onward)    None      Subjective:  Patient was seen and examined at bedside.  Overnight events noted. Patient was sitting comfortably in the bed, denies any pain, no other symptoms. He reports feeling better, he was drinking protein shake.  Objective: Vitals:   04/18/24 0405 04/18/24 0508 04/18/24 0733 04/18/24 1207  BP: (!) 93/53 96/60  113/69 118/63  Pulse: 61  61 63  Resp: 16  19 17   Temp: 97.9 F (36.6 C)  98 F (36.7 C) 97.9 F (36.6 C)  TempSrc: Oral  Oral Oral  SpO2: 99%  100% 99%  Weight:      Height:        Intake/Output Summary (Last 24 hours) at 04/18/2024 1405 Last data filed at 04/18/2024 0750 Gross per 24 hour  Intake --  Output 400 ml  Net -400 ml   Filed Weights   04/16/24 1847  Weight: 77.6 kg    Examination:  General exam: Appears calm and comfortable, not in any acute distress. Respiratory system: CTA Bilaterally. Respiratory effort normal. RR 15 Cardiovascular system: S1 & S2 heard, RRR. No JVD, murmurs, rubs, gallops or clicks.  Gastrointestinal system: Abdomen is non distended, soft and non tender.  Normal bowel sounds heard. Central nervous system: Alert and oriented X 3. No focal neurological deficits. Extremities: No edema, no cyanosis, no clubbing. Skin: No rashes, lesions or ulcers Psychiatry: Judgement and insight appear normal. Mood & affect appropriate.     Data Reviewed: I have personally reviewed following labs and imaging studies  CBC: Recent Labs  Lab 04/15/24 1558 04/16/24 0449 04/17/24 0259 04/18/24 0225  WBC 2.1* 1.8* 1.7* 2.1*  NEUTROABS 1.0*  --   --   --   HGB 10.3* 9.3* 10.6* 8.0*  HCT 32.1* 27.7* 31.4* 23.3*  MCV 95.0 90.8 91.3 88.3  PLT 52* 45* 49* 42*   Basic Metabolic Panel: Recent Labs  Lab 04/15/24 1558 04/16/24 0449 04/17/24 0259 04/18/24 0225  NA 141 138 137 134*  K 4.0 4.4 3.9 3.4*  CL 112* 111 107 107  CO2 21* 21* 23 21*  GLUCOSE 126* 137* 148* 122*  BUN 14 14 15 18   CREATININE 1.13 1.27* 1.16 1.22  CALCIUM  9.0 8.1* 8.1* 7.4*  MG  --   --  1.6* 1.9  PHOS  --   --  3.2 3.1   GFR: Estimated Creatinine Clearance: 50.7 mL/min (by C-G formula based on SCr of 1.22 mg/dL). Liver Function Tests: Recent Labs  Lab 04/15/24 1558 04/16/24 0449  AST 23 35  ALT 11 16  ALKPHOS 104 98  BILITOT 1.9* 2.4*  PROT 7.3 6.4*  ALBUMIN 3.2*  2.9*   No results for input(s): LIPASE, AMYLASE in the last 168 hours. No results for input(s): AMMONIA in the last 168 hours. Coagulation Profile: Recent Labs  Lab 04/15/24 1558  INR 1.3*   Cardiac Enzymes: Recent Labs  Lab 04/15/24 1558  CKTOTAL 131   BNP (last 3 results) No results for input(s): PROBNP in the last 8760 hours. HbA1C: Recent Labs    04/16/24 0448  HGBA1C 5.6   CBG: Recent Labs  Lab 04/17/24 1113 04/17/24 1659 04/17/24 2113 04/18/24 0639 04/18/24 1208  GLUCAP 207* 179* 111* 111* 144*   Lipid Profile: Recent Labs    04/16/24 0449  CHOL 125  HDL 38*  LDLCALC 78  TRIG 44  CHOLHDL 3.3   Thyroid  Function Tests: No results for input(s): TSH, T4TOTAL, FREET4, T3FREE, THYROIDAB in the last 72 hours. Anemia Panel: No results for input(s): VITAMINB12, FOLATE, FERRITIN,  TIBC, IRON, RETICCTPCT in the last 72 hours. Sepsis Labs: No results for input(s): PROCALCITON, LATICACIDVEN in the last 168 hours.  No results found for this or any previous visit (from the past 240 hours).   Radiology Studies: VAS US  CAROTID Result Date: 04/17/2024 Carotid Arterial Duplex Study Patient Name:  TRU RANA  Date of Exam:   04/17/2024 Medical Rec #: [id number]       Accession #:    [id number] Date of Birth: 07/07/1945       Patient Gender: M Patient Age:   24 years Exam Location:  [facility] Procedure:      VAS US  CAROTID Referring Phys: LUCIE RHYNE --------------------------------------------------------------------------------  Indications:       CVA and Carotid artery disease. Risk Factors:      Hypertension, hyperlipidemia, Diabetes, past history of                    smoking, prior MI, coronary artery disease. Comparison Study:  No previous carotid duplex exams. CTA on 04/26/2024 showed RT                    ICA >90% & LT ICA 60-70% Performing Technologist: Jody Hill RVT, RDMS  Examination Guidelines: A complete evaluation  includes B-mode imaging, spectral Doppler, color Doppler, and power Doppler as needed of all accessible portions of each vessel. Bilateral testing is considered an integral part of a complete examination. Limited examinations for reoccurring indications may be performed as noted.  Right Carotid Findings: +----------+--------+--------+--------+---------------------+------------------+           PSV cm/sEDV cm/sStenosisPlaque Description   Comments           +----------+--------+--------+--------+---------------------+------------------+ CCA Prox  85      13                                                      +----------+--------+--------+--------+---------------------+------------------+ CCA Mid                           heterogenous                            +----------+--------+--------+--------+---------------------+------------------+ CCA Distal98      17              heterogenous         intimal thickening +----------+--------+--------+--------+---------------------+------------------+ ICA Prox  314     91      60-79%  calcific, diffuse and                                                     irregular                               +----------+--------+--------+--------+---------------------+------------------+ ICA Mid   252     58                                                      +----------+--------+--------+--------+---------------------+------------------+  ICA Distal83      23                                                      +----------+--------+--------+--------+---------------------+------------------+ ECA       129     0               calcific                                +----------+--------+--------+--------+---------------------+------------------+ +----------+--------+-------+----------------+-------------------+           PSV cm/sEDV cmsDescribe        Arm Pressure (mmHG)  +----------+--------+-------+----------------+-------------------+ Subclavian203            Multiphasic, WNL                    +----------+--------+-------+----------------+-------------------+ +---------+--------+--+--------+--+---------+ VertebralPSV cm/s64EDV cm/s13Antegrade +---------+--------+--+--------+--+---------+  Left Carotid Findings: +----------+--------+--------+--------+-------------------------------+--------+           PSV cm/sEDV cm/sStenosisPlaque Description             Comments +----------+--------+--------+--------+-------------------------------+--------+ CCA Prox  106     20                                                      +----------+--------+--------+--------+-------------------------------+--------+ CCA Mid                           calcific and heterogenous               +----------+--------+--------+--------+-------------------------------+--------+ CCA Distal141     31                                                      +----------+--------+--------+--------+-------------------------------+--------+ ICA Prox  191     56      40-59%  calcific, irregular and diffuse         +----------+--------+--------+--------+-------------------------------+--------+ ICA Mid   124     39                                                      +----------+--------+--------+--------+-------------------------------+--------+ ICA Distal97      29                                                      +----------+--------+--------+--------+-------------------------------+--------+ ECA       80                      calcific and heterogenous               +----------+--------+--------+--------+-------------------------------+--------+ +----------+--------+--------+----------------+-------------------+  PSV cm/sEDV cm/sDescribe        Arm Pressure (mmHG) +----------+--------+--------+----------------+-------------------+  [id number]             Multiphasic, WNL                    +----------+--------+--------+----------------+-------------------+ +---------+--------+--+--------+--+---------+ VertebralPSV cm/s51EDV cm/s17Antegrade +---------+--------+--+--------+--+---------+   Summary: Right Carotid: Velocities in the right ICA are consistent with a 60-79% stenosis                (upper end of range). Left Carotid: Velocities in the left ICA are consistent with a 40-59% stenosis               (upper end of range). Vertebrals:  Bilateral vertebral arteries demonstrate antegrade flow. Subclavians: Normal flow hemodynamics were seen in bilateral subclavian              arteries. *See table(s) above for measurements and observations.     Preliminary    Scheduled Meds:  aspirin   81 mg Oral Daily   atorvastatin   80 mg Oral q1800   clopidogrel   75 mg Oral Daily   feeding supplement  237 mL Oral BID BM   FLUoxetine   20 mg Oral Daily   insulin  aspart  0-9 Units Subcutaneous TID WC   Continuous Infusions:  sodium chloride  125 mL/hr at 04/18/24 0424     LOS: 2 days    Time spent: 35 mins    Darcel Dawley, MD [facility]   If 7PM-7AM, please contact night-coverage

## 2024-04-18 NOTE — Plan of Care (Signed)

## 2024-04-18 NOTE — Plan of Care (Signed)
 On strict flat bedrest now because of hypotensive episodes.  Lying in bed and talking with wife.  No complaints.    Problem: Coping: Goal: Ability to adjust to condition or change in health will improve Outcome: Progressing   Problem: Fluid Volume: Goal: Ability to maintain a balanced intake and output will improve Outcome: Progressing   Problem: Metabolic: Goal: Ability to maintain appropriate glucose levels will improve Outcome: Progressing   Problem: Nutritional: Goal: Maintenance of adequate nutrition will improve Outcome: Progressing   Problem: Coping: Goal: Will verbalize positive feelings about self Outcome: Progressing   Problem: Self-Care: Goal: Ability to participate in self-care as condition permits will improve Outcome: Progressing   Problem: Health Behavior/Discharge Planning: Goal: Goals will be collaboratively established with patient/family Outcome: Progressing   Problem: Education: Goal: Knowledge of secondary prevention will improve (MUST DOCUMENT ALL) Outcome: Progressing

## 2024-04-19 LAB — CBC
HCT: 23 % — ABNORMAL LOW (ref 39.0–52.0)
Hemoglobin: 8.2 g/dL — ABNORMAL LOW (ref 13.0–17.0)
MCH: 31.2 pg (ref 26.0–34.0)
MCHC: 35.7 g/dL (ref 30.0–36.0)
MCV: 87.5 fL (ref 80.0–100.0)
Platelets: 47 K/uL — ABNORMAL LOW (ref 150–400)
RBC: 2.63 MIL/uL — ABNORMAL LOW (ref 4.22–5.81)
RDW: 19.3 % — ABNORMAL HIGH (ref 11.5–15.5)
WBC: 2 K/uL — ABNORMAL LOW (ref 4.0–10.5)
nRBC: 0 % (ref 0.0–0.2)

## 2024-04-19 LAB — GLUCOSE, CAPILLARY
Glucose-Capillary: 135 mg/dL — ABNORMAL HIGH (ref 70–99)
Glucose-Capillary: 159 mg/dL — ABNORMAL HIGH (ref 70–99)
Glucose-Capillary: 171 mg/dL — ABNORMAL HIGH (ref 70–99)
Glucose-Capillary: 92 mg/dL (ref 70–99)

## 2024-04-19 NOTE — Progress Notes (Signed)
 PROGRESS NOTE    Christopher Roberts  FMW:[id number] DOB: 1945/10/13 DOA: 04/15/2024 PCP: [name], [name], MD   Brief Narrative:  This 78 yrs old Male with PMH significant for CAD status post PCI in 2022, diabetes mellitus type 2, pancytopenia following up with Dr. Autumn oncologist and being worked up for possible MDS, hypertension who had a fall today morning when he tried to get up from the bed around 10 AM.  Patient lost balance and fell onto the floor and was on the floor for almost an hour when his family member found him.  Later noticed to have right lower extremity weakness and was brought in the ED.  EMS noted that his right leg was flaccid.   MRI brain shows features concerning for acute punctate stroke. Patient's right lower extremity slowly improving and is able to move at this time and his strength is around 3/ 5.  Neurologist was consulted and recommended stroke workup for acute stroke.  Patient was admitted for further evaluation.   Assessment & Plan:   Principal Problem:   Acute CVA (cerebrovascular accident) [facility]) Active Problems:   Hyperlipidemia   Essential hypertension   Coronary artery disease due to lipid rich plaque   Diabetes mellitus (HCC)   Cirrhosis of liver (HCC)  Acute CVA: R punctate MCA/ACA infarcts Patient presented with RLE weakness, MRI shows acute punctate stroke.   Patient was evaluated by Neurology , Appreciate neurology consult and recommendations.   Continue aspirin , Plavix  and statins.   Frequent Neurochecks.  Patient passed stroke swallow screen. Dysphagia III diet. CTA Head/ Neck showed right ICA stenosis > 90, left ICA stenosis  60 to 70% 2D echo shows LVEF 60 to 65%, LA moderately dilated. LDL 78 Near goal.  Hb A1c 5.9.  Drug screen negative. PT and OT evaluation.  Aggressive risk factor modification. Vascular surgery consulted for bilateral carotid artery stenosis.   Bilateral carotid artery stenosis: Vascular surgery is consulted.  Carotid  duplex > R ICA 60-79% stenosis, L ICA 40-59% stenosis  Although right stenosis is more significant however left stenosis more symptomatic,   Vascular surgery consulted appreciate Dr. Gaylene assistance.   Plan for Left TCAR on 04/21/2024.  Essential hypertension , now Hypotension: Given acute stroke , and low BP  hold antihypertensives. Continue midodrine  10 mg 3 times daily.  History of CAD status post PCI: He  denies chest pain. Continue antiplatelet and statins, hold beta-blockers.  Pancytopenia :  Patient being followed by oncologist,  has plans for bone marrow biopsy next week to rule out MDS. Hematology/ oncology consulted. Hematology recommended to keep platelet above 50K for procedure and hemoglobin above 7 and to transfuse as needed.  History of SVT : He is being followed by cardiologist. HR controlled.  Diabetes mellitus type 2:  Last hemoglobin A1c was 5.9, He takes metformin  at home,  presently on sliding scale coverage.  Cirrhosis of liver : It was recently noted in oncology notes.  Closely monitor.  Further workup as outpatient.   Note that patient is receiving fluids at this time due to low normal blood pressure.  Depression Continue fluoxetine .   DVT prophylaxis: SCDs Code Status: Full code Family Communication:  No family at bed side. Disposition Plan:  Admitted for CVA , Workup in progress. Oncology is consulted for pancytopenia. Vascular surgery is consulted for bilateral carotid artery stenosis. Scheduled for Left TCAR on Monday.   Consultants:  Neurology Oncology Vascular surgery  Procedures: CT, MRI  Antimicrobials:  Anti-infectives (From admission, onward)    None      Subjective: Patient was seen and examined at bedside.  Overnight events noted. Patient was sitting comfortably in the bed, denies any pain, dizziness improved. He reports feeling better, scheduled for left TCAR on Monday.  Objective: Vitals:   04/19/24 0013 04/19/24  0430 04/19/24 0735 04/19/24 1146  BP: (!) 114/57 (!) 141/67 119/72 (!) 154/80  Pulse: 74 81 74 81  Resp: 18 18 16 20   Temp: 98.4 F (36.9 C) 99.1 F (37.3 C) 98.3 F (36.8 C) 97.8 F (36.6 C)  TempSrc: Oral Oral Oral Oral  SpO2: 99% 97% 95% 100%  Weight:      Height:        Intake/Output Summary (Last 24 hours) at 04/19/2024 1305 Last data filed at 04/19/2024 1028 Gross per 24 hour  Intake 1452.09 ml  Output 750 ml  Net 702.09 ml   Filed Weights   04/16/24 1847  Weight: 77.6 kg    Examination:  General exam: Appears calm and comfortable, not in any acute distress. Respiratory system: CTA Bilaterally. Respiratory effort normal. RR 14 Cardiovascular system: S1 & S2 heard, RRR. No JVD, murmurs, rubs, gallops or clicks.  Gastrointestinal system: Abdomen is non distended, soft and non tender.  Normal bowel sounds heard. Central nervous system: Alert and oriented X 3. No focal neurological deficits. Extremities: No edema, no cyanosis, no clubbing. Skin: No rashes, lesions or ulcers Psychiatry: Judgement and insight appear normal. Mood & affect appropriate.     Data Reviewed: I have personally reviewed following labs and imaging studies  CBC: Recent Labs  Lab 04/15/24 1558 04/16/24 0449 04/17/24 0259 04/18/24 0225 04/19/24 0841  WBC 2.1* 1.8* 1.7* 2.1* 2.0*  NEUTROABS 1.0*  --   --   --   --   HGB 10.3* 9.3* 10.6* 8.0* 8.2*  HCT 32.1* 27.7* 31.4* 23.3* 23.0*  MCV 95.0 90.8 91.3 88.3 87.5  PLT 52* 45* 49* 42* 47*   Basic Metabolic Panel: Recent Labs  Lab 04/15/24 1558 04/16/24 0449 04/17/24 0259 04/18/24 0225  NA 141 138 137 134*  K 4.0 4.4 3.9 3.4*  CL 112* 111 107 107  CO2 21* 21* 23 21*  GLUCOSE 126* 137* 148* 122*  BUN 14 14 15 18   CREATININE 1.13 1.27* 1.16 1.22  CALCIUM  9.0 8.1* 8.1* 7.4*  MG  --   --  1.6* 1.9  PHOS  --   --  3.2 3.1   GFR: Estimated Creatinine Clearance: 50.7 mL/min (by C-G formula based on SCr of 1.22 mg/dL). Liver  Function Tests: Recent Labs  Lab 04/15/24 1558 04/16/24 0449  AST 23 35  ALT 11 16  ALKPHOS 104 98  BILITOT 1.9* 2.4*  PROT 7.3 6.4*  ALBUMIN  3.2* 2.9*   No results for input(s): LIPASE, AMYLASE in the last 168 hours. No results for input(s): AMMONIA in the last 168 hours. Coagulation Profile: Recent Labs  Lab 04/15/24 1558  INR 1.3*   Cardiac Enzymes: Recent Labs  Lab 04/15/24 1558  CKTOTAL 131   BNP (last 3 results) No results for input(s): PROBNP in the last 8760 hours. HbA1C: No results for input(s): HGBA1C in the last 72 hours.  CBG: Recent Labs  Lab 04/18/24 1208 04/18/24 1614 04/18/24 2120 04/19/24 0620 04/19/24 1147  GLUCAP 144* 233* 173* 135* 159*   Lipid Profile: No results for input(s): CHOL, HDL, LDLCALC, TRIG, CHOLHDL, LDLDIRECT in the last 72 hours.  Thyroid  Function Tests: No results  for input(s): TSH, T4TOTAL, FREET4, T3FREE, THYROIDAB in the last 72 hours. Anemia Panel: No results for input(s): VITAMINB12, FOLATE, FERRITIN, TIBC, IRON, RETICCTPCT in the last 72 hours. Sepsis Labs: No results for input(s): PROCALCITON, LATICACIDVEN in the last 168 hours.  No results found for this or any previous visit (from the past 240 hours).   Radiology Studies: VAS US  CAROTID Result Date: 04/19/2024 Carotid Arterial Duplex Study Patient Name:  INIGO LANTIGUA  Date of Exam:   04/17/2024 Medical Rec #: [id number]       Accession #:    [id number] Date of Birth: [birth year]       Patient Gender: M Patient Age:   74 years Exam Location:  [facility] Procedure:      VAS US  CAROTID Referring Phys: LUCIE RHYNE --------------------------------------------------------------------------------  Indications:       CVA and Carotid artery disease. Risk Factors:      Hypertension, hyperlipidemia, Diabetes, past history of                    smoking, prior MI, coronary artery disease. Comparison Study:  No previous  carotid duplex exams. CTA on 04/26/2024 showed RT                    ICA >90% & LT ICA 60-70% Performing Technologist: Jody Hill RVT, RDMS  Examination Guidelines: A complete evaluation includes B-mode imaging, spectral Doppler, color Doppler, and power Doppler as needed of all accessible portions of each vessel. Bilateral testing is considered an integral part of a complete examination. Limited examinations for reoccurring indications may be performed as noted.  Right Carotid Findings: +----------+--------+--------+--------+---------------------+------------------+           PSV cm/sEDV cm/sStenosisPlaque Description   Comments           +----------+--------+--------+--------+---------------------+------------------+ CCA Prox  85      13                                                      +----------+--------+--------+--------+---------------------+------------------+ CCA Mid                           heterogenous                            +----------+--------+--------+--------+---------------------+------------------+ CCA Distal98      17              heterogenous         intimal thickening +----------+--------+--------+--------+---------------------+------------------+ ICA Prox  314     91      60-79%  calcific, diffuse and                                                     irregular                               +----------+--------+--------+--------+---------------------+------------------+ ICA Mid   252     58                                                      +----------+--------+--------+--------+---------------------+------------------+  ICA Distal83      23                                                      +----------+--------+--------+--------+---------------------+------------------+ ECA       129     0               calcific                                +----------+--------+--------+--------+---------------------+------------------+  +----------+--------+-------+----------------+-------------------+           PSV cm/sEDV cmsDescribe        Arm Pressure (mmHG) +----------+--------+-------+----------------+-------------------+ Subclavian203            Multiphasic, WNL                    +----------+--------+-------+----------------+-------------------+ +---------+--------+--+--------+--+---------+ VertebralPSV cm/s64EDV cm/s13Antegrade +---------+--------+--+--------+--+---------+  Left Carotid Findings: +----------+--------+--------+--------+-------------------------------+--------+           PSV cm/sEDV cm/sStenosisPlaque Description             Comments +----------+--------+--------+--------+-------------------------------+--------+ CCA Prox  106     20                                                      +----------+--------+--------+--------+-------------------------------+--------+ CCA Mid                           calcific and heterogenous               +----------+--------+--------+--------+-------------------------------+--------+ CCA Distal141     31                                                      +----------+--------+--------+--------+-------------------------------+--------+ ICA Prox  191     56      40-59%  calcific, irregular and diffuse         +----------+--------+--------+--------+-------------------------------+--------+ ICA Mid   124     39                                                      +----------+--------+--------+--------+-------------------------------+--------+ ICA Distal97      29                                                      +----------+--------+--------+--------+-------------------------------+--------+ ECA       80                      calcific and heterogenous               +----------+--------+--------+--------+-------------------------------+--------+ +----------+--------+--------+----------------+-------------------+  PSV cm/sEDV cm/sDescribe        Arm Pressure (mmHG) +----------+--------+--------+----------------+-------------------+ [id number]             Multiphasic, WNL                    +----------+--------+--------+----------------+-------------------+ +---------+--------+--+--------+--+---------+ VertebralPSV cm/s51EDV cm/s17Antegrade +---------+--------+--+--------+--+---------+   Summary: Right Carotid: Velocities in the right ICA are consistent with a 60-79% stenosis                (upper end of range). Left Carotid: Velocities in the left ICA are consistent with a 40-59% stenosis               (upper end of range). Vertebrals:  Bilateral vertebral arteries demonstrate antegrade flow. Subclavians: Normal flow hemodynamics were seen in bilateral subclavian              arteries. *See table(s) above for measurements and observations.  Electronically signed by Eather Popp MD on 04/19/2024 at 11:29:45 AM.    Final    Scheduled Meds:  aspirin   81 mg Oral Daily   atorvastatin   80 mg Oral q1800   clopidogrel   75 mg Oral Daily   feeding supplement  237 mL Oral BID BM   FLUoxetine   20 mg Oral Daily   insulin  aspart  0-9 Units Subcutaneous TID WC   midodrine   10 mg Oral TID WC   Continuous Infusions:     LOS: 3 days    Time spent: 35 mins    Darcel Dawley, MD [facility]   If 7PM-7AM, please contact night-coverage

## 2024-04-19 NOTE — Progress Notes (Signed)
°   04/19/24 1650  Urine Measurement/Characteristics  Urine Color Yellow/straw  Urine Appearance Clear  Bladder Scan Volume (mL) (S)  404 mL  Intermittent/Straight Cath (mL) (S)  1000 mL  Hygiene Peri care   Christopher Roberts voices that he is having the urge to void but unable, abdomen appears distended. Please see data above. He shared that he feels better after straight cath. MD aware.

## 2024-04-19 NOTE — Progress Notes (Signed)
 STROKE TEAM PROGRESS NOTE   SUBJECTIVE (INTERVAL HISTORY) Pt had transient right leg weakness in the setting of relative hypotension.  He has been on bedrest and given IV hydration as well as albumin  and started on midodrine .  States right leg weakness appears to have improved though not back to baseline.  He has no new complaints.  Blood pressure has been running higher in the 140s systolic range today. OBJECTIVE Temp:  [97.7 F (36.5 C)-99.6 F (37.6 C)] 98.3 F (36.8 C) (12/13 0735) Pulse Rate:  [63-81] 74 (12/13 0735) Cardiac Rhythm: Normal sinus rhythm (12/13 0700) Resp:  [16-18] 16 (12/13 0735) BP: (93-141)/(51-72) 119/72 (12/13 0735) SpO2:  [95 %-100 %] 95 % (12/13 0735)  Recent Labs  Lab 04/18/24 0639 04/18/24 1208 04/18/24 1614 04/18/24 2120 04/19/24 0620  GLUCAP 111* 144* 233* 173* 135*   Recent Labs  Lab 04/15/24 1558 04/16/24 0449 04/17/24 0259 04/18/24 0225  NA 141 138 137 134*  K 4.0 4.4 3.9 3.4*  CL 112* 111 107 107  CO2 21* 21* 23 21*  GLUCOSE 126* 137* 148* 122*  BUN 14 14 15 18   CREATININE 1.13 1.27* 1.16 1.22  CALCIUM  9.0 8.1* 8.1* 7.4*  MG  --   --  1.6* 1.9  PHOS  --   --  3.2 3.1   Recent Labs  Lab 04/15/24 1558 04/16/24 0449  AST 23 35  ALT 11 16  ALKPHOS 104 98  BILITOT 1.9* 2.4*  PROT 7.3 6.4*  ALBUMIN  3.2* 2.9*   Recent Labs  Lab 04/15/24 1558 04/16/24 0449 04/17/24 0259 04/18/24 0225 04/19/24 0841  WBC 2.1* 1.8* 1.7* 2.1* 2.0*  NEUTROABS 1.0*  --   --   --   --   HGB 10.3* 9.3* 10.6* 8.0* 8.2*  HCT 32.1* 27.7* 31.4* 23.3* 23.0*  MCV 95.0 90.8 91.3 88.3 87.5  PLT 52* 45* 49* 42* 47*   Recent Labs  Lab 04/15/24 1558  CKTOTAL 131   No results for input(s): LABPROT, INR in the last 72 hours.  No results for input(s): COLORURINE, LABSPEC, PHURINE, GLUCOSEU, HGBUR, BILIRUBINUR, KETONESUR, PROTEINUR, UROBILINOGEN, NITRITE, LEUKOCYTESUR in the last 72 hours.  Invalid input(s): APPERANCEUR      Component Value Date/Time   CHOL 125 04/16/2024 0449   TRIG 44 04/16/2024 0449   HDL 38 (L) 04/16/2024 0449   CHOLHDL 3.3 04/16/2024 0449   VLDL 9 04/16/2024 0449   LDLCALC 78 04/16/2024 0449   Lab Results  Component Value Date   HGBA1C 5.6 04/16/2024      Component Value Date/Time   LABOPIA NONE DETECTED 04/15/2024 1611   COCAINSCRNUR NONE DETECTED 04/15/2024 1611   LABBENZ NONE DETECTED 04/15/2024 1611   AMPHETMU NONE DETECTED 04/15/2024 1611   THCU NONE DETECTED 04/15/2024 1611   LABBARB NONE DETECTED 04/15/2024 1611    Recent Labs  Lab 04/15/24 1558  ETH <15    I have personally reviewed the radiological images below and agree with the radiology interpretations.  VAS US  CAROTID Result Date: 04/19/2024 Carotid Arterial Duplex Study Patient Name:  Christopher Roberts  Date of Exam:   04/17/2024 Medical Rec #: [id number]       Accession #:    [id number] Date of Birth: 03-23-1946       Patient Gender: M Patient Age:   59 years Exam Location:  [facility] Procedure:      VAS US  CAROTID Referring Phys: LUCIE RHYNE --------------------------------------------------------------------------------  Indications:       CVA  and Carotid artery disease. Risk Factors:      Hypertension, hyperlipidemia, Diabetes, past history of                    smoking, prior MI, coronary artery disease. Comparison Study:  No previous carotid duplex exams. CTA on 04/26/2024 showed RT                    ICA >90% & LT ICA 60-70% Performing Technologist: Jody Hill RVT, RDMS  Examination Guidelines: A complete evaluation includes B-mode imaging, spectral Doppler, color Doppler, and power Doppler as needed of all accessible portions of each vessel. Bilateral testing is considered an integral part of a complete examination. Limited examinations for reoccurring indications may be performed as noted.  Right Carotid Findings: +----------+--------+--------+--------+---------------------+------------------+            PSV cm/sEDV cm/sStenosisPlaque Description   Comments           +----------+--------+--------+--------+---------------------+------------------+ CCA Prox  85      13                                                      +----------+--------+--------+--------+---------------------+------------------+ CCA Mid                           heterogenous                            +----------+--------+--------+--------+---------------------+------------------+ CCA Distal98      17              heterogenous         intimal thickening +----------+--------+--------+--------+---------------------+------------------+ ICA Prox  314     91      60-79%  calcific, diffuse and                                                     irregular                               +----------+--------+--------+--------+---------------------+------------------+ ICA Mid   252     58                                                      +----------+--------+--------+--------+---------------------+------------------+ ICA Distal83      23                                                      +----------+--------+--------+--------+---------------------+------------------+ ECA       129     0               calcific                                +----------+--------+--------+--------+---------------------+------------------+ +----------+--------+-------+----------------+-------------------+  PSV cm/sEDV cmsDescribe        Arm Pressure (mmHG) +----------+--------+-------+----------------+-------------------+ Subclavian203            Multiphasic, WNL                    +----------+--------+-------+----------------+-------------------+ +---------+--------+--+--------+--+---------+ VertebralPSV cm/s64EDV cm/s13Antegrade +---------+--------+--+--------+--+---------+  Left Carotid Findings: +----------+--------+--------+--------+-------------------------------+--------+            PSV cm/sEDV cm/sStenosisPlaque Description             Comments +----------+--------+--------+--------+-------------------------------+--------+ CCA Prox  106     20                                                      +----------+--------+--------+--------+-------------------------------+--------+ CCA Mid                           calcific and heterogenous               +----------+--------+--------+--------+-------------------------------+--------+ CCA Distal141     31                                                      +----------+--------+--------+--------+-------------------------------+--------+ ICA Prox  191     56      40-59%  calcific, irregular and diffuse         +----------+--------+--------+--------+-------------------------------+--------+ ICA Mid   124     39                                                      +----------+--------+--------+--------+-------------------------------+--------+ ICA Distal97      29                                                      +----------+--------+--------+--------+-------------------------------+--------+ ECA       80                      calcific and heterogenous               +----------+--------+--------+--------+-------------------------------+--------+ +----------+--------+--------+----------------+-------------------+           PSV cm/sEDV cm/sDescribe        Arm Pressure (mmHG) +----------+--------+--------+----------------+-------------------+ [id number]             Multiphasic, WNL                    +----------+--------+--------+----------------+-------------------+ +---------+--------+--+--------+--+---------+ VertebralPSV cm/s51EDV cm/s17Antegrade +---------+--------+--+--------+--+---------+   Summary: Right Carotid: Velocities in the right ICA are consistent with a 60-79% stenosis                (upper end of range). Left Carotid: Velocities in the left ICA are  consistent with a 40-59% stenosis               (upper end of range). Vertebrals:  Bilateral vertebral arteries demonstrate antegrade flow. Subclavians:  Normal flow hemodynamics were seen in bilateral subclavian              arteries. *See table(s) above for measurements and observations.  Electronically signed by Eather Popp MD on 04/19/2024 at 11:29:45 AM.    Final    ECHOCARDIOGRAM COMPLETE Result Date: 04/16/2024    ECHOCARDIOGRAM REPORT   Patient Name:   Christopher Roberts Date of Exam: 04/16/2024 Medical Rec #:  [id number]      Height:       69.0 in Accession #:    [id number]     Weight:       172.4 lb Date of Birth:  02/05/1946      BSA:          1.940 m Patient Age:    78 years       BP:           138/67 mmHg Patient Gender: M              HR:           74 bpm. Exam Location:  Inpatient Procedure: 2D Echo, Cardiac Doppler and Color Doppler (Both Spectral and Color            Flow Doppler were utilized during procedure). Indications:    Stroke 163.9  History:        Patient has prior history of Echocardiogram examinations, most                 recent 07/26/2020. CAD and Previous Myocardial Infarction,                 Stroke, Signs/Symptoms:Chest Pain and Shortness of Breath; Risk                 Factors:Dyslipidemia, Hypertension, Diabetes and Former Smoker.  Sonographer:    Merlynn Argyle Referring Phys: [phone] REDIA LOISE CLEAVER  Sonographer Comments: Image acquisition challenging due to respiratory motion. IMPRESSIONS  1. Left ventricular ejection fraction, by estimation, is 60 to 65%. The left ventricle has normal function. The left ventricle has no regional wall motion abnormalities. There is mild asymmetric left ventricular hypertrophy of the basal-septal segment. Left ventricular diastolic parameters are consistent with Grade I diastolic dysfunction (impaired relaxation).  2. Right ventricular systolic function is normal. The right ventricular size is mildly enlarged. There is normal pulmonary artery  systolic pressure. The estimated right ventricular systolic pressure is 16.1 mmHg.  3. Left atrial size was moderately dilated.  4. The mitral valve is degenerative. Mild mitral valve regurgitation. No evidence of mitral stenosis. Moderate mitral annular calcification.  5. The aortic valve is tricuspid. There is mild calcification of the aortic valve. There is mild thickening of the aortic valve. Aortic valve regurgitation is trivial. Aortic valve sclerosis is present, with no evidence of aortic valve stenosis.  6. The inferior vena cava is normal in size with greater than 50% respiratory variability, suggesting right atrial pressure of 3 mmHg. Conclusion(s)/Recommendation(s): No intracardiac source of embolism detected on this transthoracic study. Consider a transesophageal echocardiogram to exclude cardiac source of embolism if clinically indicated. FINDINGS  Left Ventricle: Left ventricular ejection fraction, by estimation, is 60 to 65%. The left ventricle has normal function. The left ventricle has no regional wall motion abnormalities. The left ventricular internal cavity size was normal in size. There is  mild asymmetric left ventricular hypertrophy of the basal-septal segment. Left ventricular diastolic parameters are consistent with Grade I diastolic dysfunction (impaired relaxation). Right Ventricle: The  right ventricular size is mildly enlarged. No increase in right ventricular wall thickness. Right ventricular systolic function is normal. There is normal pulmonary artery systolic pressure. The tricuspid regurgitant velocity is 1.81  m/s, and with an assumed right atrial pressure of 3 mmHg, the estimated right ventricular systolic pressure is 16.1 mmHg. Left Atrium: Left atrial size was moderately dilated. Right Atrium: Right atrial size was normal in size. Pericardium: There is no evidence of pericardial effusion. Mitral Valve: The mitral valve is degenerative in appearance. There is mild thickening of  the mitral valve leaflet(s). There is mild calcification of the mitral valve leaflet(s). Moderate mitral annular calcification. Mild mitral valve regurgitation. No evidence of mitral valve stenosis. Tricuspid Valve: The tricuspid valve is normal in structure. Tricuspid valve regurgitation is trivial. No evidence of tricuspid stenosis. Aortic Valve: The aortic valve is tricuspid. There is mild calcification of the aortic valve. There is mild thickening of the aortic valve. Aortic valve regurgitation is trivial. Aortic valve sclerosis is present, with no evidence of aortic valve stenosis. Pulmonic Valve: The pulmonic valve was normal in structure. Pulmonic valve regurgitation is trivial. No evidence of pulmonic stenosis. Aorta: The aortic root is normal in size and structure. Venous: The inferior vena cava is normal in size with greater than 50% respiratory variability, suggesting right atrial pressure of 3 mmHg. IAS/Shunts: No atrial level shunt detected by color flow Doppler.  LEFT VENTRICLE PLAX 2D LVIDd:         4.60 cm   Diastology LVIDs:         3.40 cm   LV e' medial:    4.66 cm/s LV PW:         1.00 cm   LV E/e' medial:  19.7 LV IVS:        1.30 cm   LV e' lateral:   15.50 cm/s LVOT diam:     2.20 cm   LV E/e' lateral: 5.9 LV SV:         108 LV SV Index:   56 LVOT Area:     3.80 cm  RIGHT VENTRICLE RV Basal diam:  4.30 cm RV Mid diam:    3.50 cm RV S prime:     21.80 cm/s TAPSE (M-mode): 1.5 cm LEFT ATRIUM             Index        RIGHT ATRIUM           Index LA diam:        4.00 cm 2.06 cm/m   RA Area:     19.00 cm LA Vol (A2C):   71.7 ml 36.97 ml/m  RA Volume:   51.60 ml  26.60 ml/m LA Vol (A4C):   96.7 ml 49.85 ml/m LA Biplane Vol: 87.0 ml 44.85 ml/m  AORTIC VALVE LVOT Vmax:   127.00 cm/s LVOT Vmean:  94.800 cm/s LVOT VTI:    0.285 m  AORTA Ao Root diam: 3.70 cm Ao Asc diam:  3.70 cm MITRAL VALVE               TRICUSPID VALVE MV Area (PHT): 2.55 cm    TR Peak grad:   13.1 mmHg MV Decel Time: 297  msec    TR Vmax:        181.00 cm/s MV E velocity: 92.00 cm/s MV A velocity: 90.30 cm/s  SHUNTS MV E/A ratio:  1.02        Systemic VTI:  0.29 m  Systemic Diam: 2.20 cm Oneil Parchment MD Electronically signed by Oneil Parchment MD Signature Date/Time: 04/16/2024/11:50:06 AM    Final    CT ANGIO HEAD NECK W WO CM Result Date: 04/16/2024 EXAM: CTA HEAD AND NECK WITHOUT AND WITH 04/16/2024 05:41:14 AM TECHNIQUE: CTA of the head and neck was performed without and with the administration of 75 mL of iohexol  (OMNIPAQUE ) 350 MG/ML injection. Multiplanar 2D and/or 3D reformatted images are provided for review. Automated exposure control, iterative reconstruction, and/or weight based adjustment of the mA/kV was utilized to reduce the radiation dose to as low as reasonably achievable. Stenosis of the internal carotid arteries measured using NASCET criteria. COMPARISON: CT of the head dated 04/15/2024 CLINICAL HISTORY: Stroke/TIA, determine embolic source. FINDINGS: CTA NECK: AORTIC ARCH AND ARCH VESSELS: No dissection or arterial injury. No significant stenosis of the brachiocephalic or subclavian arteries. There is calcific atheromatous disease within the aortic arch. CERVICAL CAROTID ARTERIES: No dissection or arterial injury. There is moderate calcific plaque within the carotid bulbs and origins of the internal carotid arteries bilaterally. There is near occlusive stenosis of the proximal right internal carotid artery with estimated stenosis 90% or greater. There is extensive calcific plaque within the proximal left internal carotid artery with estimated stenosis approximately 60 to 70%. CERVICAL VERTEBRAL ARTERIES: No dissection, arterial injury, or significant stenosis. LUNGS AND MEDIASTINUM: Unremarkable. SOFT TISSUES: No acute abnormality. BONES: No acute abnormality. CTA HEAD: ANTERIOR CIRCULATION: There is moderately advanced calcific atheromatous disease within the carotid siphons. There is  moderate stenosis of the cavernous and supraclinoid segments, approximately 40 to 50% bilaterally. No significant stenosis of the anterior cerebral arteries. No significant stenosis of the middle cerebral arteries. No aneurysm. POSTERIOR CIRCULATION: There is moderate-to-severe stenosis of the P2 segment of the left posterior cerebral artery and there is moderate stenosis of the P2 segment of the right posterior cerebral artery. The focal stenoses are demonstrated on images 104 and 106 of series 9, respectively. No significant stenosis of the basilar artery. There is moderately advanced calcific atheromatous disease within the vertebral arteries. No aneurysm. OTHER: There is age-related atrophy and moderately advanced cerebral white matter disease. Chronic encephalomalacia changes are noted within the frontal and parietal lobes bilaterally from remote cortical infarcts. Chronic infarcts are also noted within the cerebellar hemispheres bilaterally. No dural venous sinus thrombosis on this non-dedicated study. IMPRESSION: 1. 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 2. Extensive calcific plaque within the proximal left internal carotid artery with estimated stenosis approximately 60 to 70%. 3. Moderate-to-severe stenosis of the P2 segment of the left posterior cerebral artery and moderate stenosis of the P2 segment of the right posterior cerebral artery. 4. Moderate stenosis of the cavernous and supraclinoid segments, approximately 40 to 50% bilaterally. Electronically signed by: Evalene Coho MD 04/16/2024 06:14 AM EST RP Workstation: [id number]   MR BRAIN WO CONTRAST Result Date: 04/15/2024 EXAM: MRI BRAIN WITHOUT CONTRAST 04/15/2024 07:46:35 PM TECHNIQUE: Multiplanar multisequence MRI of the head/brain was performed without the administration of intravenous contrast. COMPARISON: CT head from earlier today. MRI head 08/30/2016. CLINICAL  HISTORY: Neuro deficit, acute, stroke suspected FINDINGS: BRAIN AND VENTRICLES: Punctate acute infarcts in the high right frontal and left parasagittal frontal lobes (series 2, images 39 and 37). No intracranial hemorrhage. No mass. No midline shift. No hydrocephalus. Advanced T2 hyperintensity in the white matter, compatible with chronic avascular ischemic disease. Remote lacunar infarcts in the corona radiata and bilateral parietal cortex. Multiple remote  cerebellar infarcts bilaterally. Normal flow voids. ORBITS: No acute abnormality. SINUSES AND MASTOIDS: No acute abnormality. BONES AND SOFT TISSUES: Normal marrow signal. No acute soft tissue abnormality. IMPRESSION: 1. Punctate acute infarcts in the high right frontal and left parasagittal frontal lobes. 2. 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 Electronically signed by: Gilmore Molt MD 04/15/2024 09:43 PM EST RP Workstation: [id number]   CT HEAD WO CONTRAST Result Date: 04/15/2024 CLINICAL DATA:  Clemens out of bed, neurologic deficit EXAM: CT HEAD WITHOUT CONTRAST TECHNIQUE: Contiguous axial images were obtained from the base of the skull through the vertex without intravenous contrast. RADIATION DOSE REDUCTION: This exam was performed according to the departmental dose-optimization program which includes automated exposure control, adjustment of the mA and/or kV according to patient size and/or use of iterative reconstruction technique. COMPARISON:  08/31/2016 FINDINGS: Brain: There are chronic cortical infarcts along the bilateral frontal and parietal convexities, as well as within the bilateral cerebellar hemispheres. Chronic small vessel ischemic changes are seen throughout the periventricular and subcortical white matter. No evidence of acute infarct or hemorrhage. Dense bilateral basal ganglia calcifications are again noted. Lateral ventricles and midline structures are otherwise unremarkable. No acute  extra-axial fluid collections. No mass effect. Vascular: No hyperdense vessel or unexpected calcification. Skull: Normal. Negative for fracture or focal lesion. Sinuses/Orbits: No acute finding. Other: None. IMPRESSION: 1. No acute intracranial process. 2. Chronic ischemic changes as above. Electronically Signed   By: Ozell Daring M.D.   On: 04/15/2024 17:50     PHYSICAL EXAM  Temp:  [97.7 F (36.5 C)-99.6 F (37.6 C)] 98.3 F (36.8 C) (12/13 0735) Pulse Rate:  [63-81] 74 (12/13 0735) Resp:  [16-18] 16 (12/13 0735) BP: (93-141)/(51-72) 119/72 (12/13 0735) SpO2:  [95 %-100 %] 95 % (12/13 0735)  General - Well nourished, well developed, in no apparent distress.  Mental Status -  Level of arousal and orientation to time, place, and person were intact. Language including expression, naming, repetition, comprehension was assessed and found intact. Attention span and concentration were normal. Recent and remote memory were intact. Fund of Knowledge was assessed and was intact.  Cranial Nerves II - XII - II - Visual field intact OU. III, IV, VI - Extraocular movements intact. V - Facial sensation intact bilaterally. VII - Facial movement intact bilaterally. VIII - Hearing & vestibular intact bilaterally. X - Palate elevates symmetrically. XI - Chin turning & shoulder shrug intact bilaterally. XII - Tongue protrusion intact.  Motor Strength - L arm and leg 5/5. R arm 5/5. R hip flexion 4/5, knee extension 5/5, dorsi and plantar flexion 4/5. Bulk was normal and fasciculations were absent. Overall improved significantly from yesterday.  Sensory - Light touch were assessed and were symmetrical.    Coordination -   Tremor was absent.  Gait and Station - deferred.   ASSESSMENT/PLAN Mr. Christopher Roberts is a 78 y.o. male with history of CAD s/p CABG, DM, DJD, HLD, HTN admitted for Acute R sided weakness. Imaging shows R ICA >90% stenosis, L ICA 60-70% stenosis, with likely L ICA related  symptoms. Need to keep him from hypotension. VSS planning on TCAR on Monday 04/21/2024. Heme/Onc consulted for pancytopenia: recommended platelets >50k for procedure and Hgb >7.0 and to transfuse as needed.    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 2D Echo: EF 60-65%, LA moderately dilated, RA normal in size CUS R ICA 60-79% stenosis, L ICA 40-59% stenosis LDL 78 HgbA1c 5.9 UDS negative aspirin  81 mg daily and clopidogrel  75 mg daily prior to admission, now on aspirin  81 mg daily and clopidogrel  75 mg daily.  Further regimen per VVS Patient counseled to be compliant with his antithrombotic medications Ongoing aggressive stroke risk factor management Therapy recommendations: CIR Disposition: Pending  Fluctuating right-sided weakness 12/10 Reported by EMS patient was flaccid on right side, improved in the ER.  However this morning had worsening weakness on right side again when BP on the low end Likely due to flow-limiting stenosis of bilateral ICAs Off IV fluid Encourage p.o. intake Avoid low BP perioperatively  ICA stenosis CTA: R ICA Stenosis >90%, L ICA Stenosis 60-70% CUS R ICA 60-79% stenosis, L ICA 40-59% stenosis Although right stenosis more significant, however left stenosis more symptomatic Vascular surgery consulted, appreciate Dr. Gaylene assistance Plan for TCAR on 04/21/2024  Pancytopenia WBC 2.1--1.8--1.7 Hemoglobin 10.3-9.3-10.6 Platelet 52-45-49 Initial plan for bone marrow biopsy next week to rule out MDS Oncology consulted, recommended platelets >50k for procedure and Hgb >7.0 and to transfuse as needed.  Hypertension Home meds including Coreg  BP 120s now Hold off  Coreg  for now Avoid low BP BP goal 140-160 before carotid revascularization  Hyperlipidemia Home meds:  Lipitor  80mg  daily LDL 78, goal < 70 Now on Lipitor  80mg  daily Continue statin at discharge  Other Stroke Risk Factors Advanced age Coronary artery disease supposed CABG  Other medical issues AKI on CKD, creatinine 1.13-1.27-1.16 Depression on Prozac   Hospital day # 3  I have personally obtained history,examined this patient, reviewed notes, independently viewed imaging studies, participated in medical decision making and plan of care.ROS completed by me personally and pertinent positives fully documented  I have made any additions or clarifications directly to the above note. Agree with note above.  Patient had transient right leg weakness in the setting of relative hypotension yesterday but appears to have improved after bedrest and induced hypertension.  DC bedrest today and mobilize out of bed as tolerated.  Continue mild permissive hypertension with systolic 130-150 range.  Continue dual antiplatelet therapy.  Plan for elective left carotid TCAR on Monday.  No family at the bedside   I personally spent a total of 35 minutes in the care of the patient today including getting/reviewing separately obtained history, performing a medically appropriate exam/evaluation, counseling and educating, placing orders, referring and communicating with other health care professionals, documenting clinical information in the EHR, independently interpreting results, and coordinating care.        Eather Popp, MD Medical Director [facility] Pager: [phone] 04/19/2024 11:50 AM       To contact Stroke Continuity provider, please refer to [email or web address]. After hours, contact General Neurology

## 2024-04-19 NOTE — Plan of Care (Signed)
  Problem: Education: Goal: Ability to describe self-care measures that may prevent or decrease complications (Diabetes Survival Skills Education) will improve Outcome: Progressing   Problem: Coping: Goal: Ability to adjust to condition or change in health will improve Outcome: Progressing

## 2024-04-19 NOTE — Plan of Care (Signed)

## 2024-04-20 DIAGNOSIS — I6522 Occlusion and stenosis of left carotid artery: Secondary | ICD-10-CM

## 2024-04-20 LAB — CBC
HCT: 23.8 % — ABNORMAL LOW (ref 39.0–52.0)
Hemoglobin: 8.4 g/dL — ABNORMAL LOW (ref 13.0–17.0)
MCH: 30.7 pg (ref 26.0–34.0)
MCHC: 35.3 g/dL (ref 30.0–36.0)
MCV: 86.9 fL (ref 80.0–100.0)
Platelets: 49 K/uL — ABNORMAL LOW (ref 150–400)
RBC: 2.74 MIL/uL — ABNORMAL LOW (ref 4.22–5.81)
RDW: 19.3 % — ABNORMAL HIGH (ref 11.5–15.5)
WBC: 1.9 K/uL — ABNORMAL LOW (ref 4.0–10.5)
nRBC: 0 % (ref 0.0–0.2)

## 2024-04-20 LAB — BASIC METABOLIC PANEL WITH GFR
Anion gap: 9 (ref 5–15)
BUN: 16 mg/dL (ref 8–23)
CO2: 18 mmol/L — ABNORMAL LOW (ref 22–32)
Calcium: 8.3 mg/dL — ABNORMAL LOW (ref 8.9–10.3)
Chloride: 111 mmol/L (ref 98–111)
Creatinine, Ser: 1.13 mg/dL (ref 0.61–1.24)
GFR, Estimated: >60 mL/min (ref >60)
Glucose, Bld: 111 mg/dL — ABNORMAL HIGH (ref 70–99)
Potassium: 3.6 mmol/L (ref 3.5–5.1)
Sodium: 138 mmol/L (ref 135–145)

## 2024-04-20 LAB — GLUCOSE, CAPILLARY
Glucose-Capillary: 135 mg/dL — ABNORMAL HIGH (ref 70–99)
Glucose-Capillary: 248 mg/dL — ABNORMAL HIGH (ref 70–99)
Glucose-Capillary: 156 mg/dL — ABNORMAL HIGH (ref 70–99)
Glucose-Capillary: 110 mg/dL — ABNORMAL HIGH (ref 70–99)

## 2024-04-20 LAB — MAGNESIUM: Magnesium: 1.6 mg/dL — ABNORMAL LOW (ref 1.7–2.4)

## 2024-04-20 LAB — ABO/RH: ABO/RH(D): A POS

## 2024-04-20 LAB — PHOSPHORUS: Phosphorus: 3.2 mg/dL (ref 2.5–4.6)

## 2024-04-20 LAB — SURGICAL PCR SCREEN
MRSA, PCR: NEGATIVE
Staphylococcus aureus: NEGATIVE

## 2024-04-20 MED ORDER — TAMSULOSIN HCL 0.4 MG PO CAPS
0.4000 mg | ORAL_CAPSULE | Freq: Every day | ORAL | Status: DC
  Administered 2024-04-20 – 2024-04-23 (×4): 0.4 mg via ORAL
  Filled 2024-04-20 (×4): qty 1

## 2024-04-20 MED ORDER — CEFAZOLIN SODIUM-DEXTROSE 1-4 GM/50ML-% IV SOLN
1.0000 g | INTRAVENOUS | Status: AC
  Administered 2024-04-21: 11:00:00 1 g via INTRAVENOUS
  Filled 2024-04-20 (×2): qty 50

## 2024-04-20 MED ORDER — MIDODRINE HCL 5 MG PO TABS: 5.0000 mg | ORAL_TABLET | Freq: Three times a day (TID) | ORAL | Status: DC

## 2024-04-20 MED ORDER — INSULIN ASPART 100 UNIT/ML IJ SOLN
0.0000 [IU] | Freq: Every day | INTRAMUSCULAR | Status: DC
  Administered 2024-04-21: 22:00:00 3 [IU] via SUBCUTANEOUS
  Filled 2024-04-20: qty 3

## 2024-04-20 MED ORDER — MAGNESIUM SULFATE 2 GM/50ML IV SOLN
2.0000 g | Freq: Once | INTRAVENOUS | Status: AC
  Administered 2024-04-20: 2 g via INTRAVENOUS
  Filled 2024-04-20: qty 50

## 2024-04-20 MED ORDER — INSULIN ASPART 100 UNIT/ML IJ SOLN
0.0000 [IU] | Freq: Three times a day (TID) | INTRAMUSCULAR | Status: DC
  Administered 2024-04-21 – 2024-04-22 (×2): 2 [IU] via SUBCUTANEOUS
  Administered 2024-04-22: 08:00:00 1 [IU] via SUBCUTANEOUS
  Administered 2024-04-22: 12:00:00 3 [IU] via SUBCUTANEOUS
  Administered 2024-04-23 – 2024-04-24 (×2): 2 [IU] via SUBCUTANEOUS
  Filled 2024-04-20: qty 2
  Filled 2024-04-20: qty 1
  Filled 2024-04-20: qty 2
  Filled 2024-04-20: qty 1
  Filled 2024-04-20: qty 2
  Filled 2024-04-20: qty 1

## 2024-04-20 MED ORDER — CHLORHEXIDINE GLUCONATE CLOTH 2 % EX PADS
6.0000 | MEDICATED_PAD | Freq: Every day | CUTANEOUS | Status: DC
  Administered 2024-04-20: 6 via TOPICAL

## 2024-04-20 MED ORDER — MIDODRINE HCL 5 MG PO TABS
10.0000 mg | ORAL_TABLET | Freq: Three times a day (TID) | ORAL | Status: DC
  Administered 2024-04-21 – 2024-04-24 (×9): 10 mg via ORAL
  Filled 2024-04-20 (×9): qty 2

## 2024-04-20 NOTE — Progress Notes (Signed)
.. ° ° °  PROCEDURAL EXPEDITER PROGRESS NOTE  Patient Name: Christopher Roberts  DOB:06/13/1945 Date of Admission: 04/15/2024  Date of Assessment:04/20/2024   -------------------------------------------------------------------------------------------------------------------   Brief clinical summary: Pt to OR tomorrow for left transcarotid artery revascularization  Orders in place:  Yes   Communication with surgical team if no orders: N/A  Labs, test, and orders reviewed: Y  Requires surgical clearance:  No  Barriers noted: Posting and consent have different laterality    Intervention provided by [facility] team: Paged Lucie Apt, PA who wrote the order for consent to confirm correct laterality  Barrier resolved: Pending - If I have not heard back from the PA ( who is possibly in the OR asisting [place] today with emergency surgeries), I will have the procedural expeditors reach out in the morning.    -------------------------------------------------------------------------------------------------------------------  [facility], [place], [place] Please contact us  directly via secure chat (search for [facility]) or by calling us  at [phone] [facility]).

## 2024-04-20 NOTE — Plan of Care (Signed)

## 2024-04-20 NOTE — Progress Notes (Addendum)
 Patient requested for MD to call his wife and discussed about the procedure in the AM. Dr. Lenox paged and PA [facility] aware as well.    10:44 Dr Lenox returned page and he will call Mr. Villada spouse.

## 2024-04-20 NOTE — Progress Notes (Signed)
 Consent signed and in chart. MRSA pcr collect and sent to lab. Patient aware of procedure in the AM and understood not to eat or drink after midnight tonight.

## 2024-04-20 NOTE — Progress Notes (Signed)
 STROKE TEAM PROGRESS NOTE   SUBJECTIVE (INTERVAL HISTORY) Pt lying comfortably in bed states right leg weakness appears to have improved though not back to baseline.  He has no new complaints.  No complaints.  Awaiting TCAR procedure tomorrow OBJECTIVE Temp:  [97.4 F (36.3 C)-98.9 F (37.2 C)] 98.9 F (37.2 C) (12/14 0800) Pulse Rate:  [66-81] 69 (12/14 0800) Cardiac Rhythm: Normal sinus rhythm (12/14 0700) Resp:  [17-21] 20 (12/14 0800) BP: (100-156)/(55-80) 104/60 (12/14 0800) SpO2:  [99 %-100 %] 100 % (12/14 0800)  Recent Labs  Lab 04/19/24 0620 04/19/24 1147 04/19/24 1614 04/19/24 2153 04/20/24 0619  GLUCAP 135* 159* 171* 92 135*   Recent Labs  Lab 04/15/24 1558 04/16/24 0449 04/17/24 0259 04/18/24 0225 04/20/24 0638  NA 141 138 137 134* 138  K 4.0 4.4 3.9 3.4* 3.6  CL 112* 111 107 107 111  CO2 21* 21* 23 21* 18*  GLUCOSE 126* 137* 148* 122* 111*  BUN 14 14 15 18 16   CREATININE 1.13 1.27* 1.16 1.22 1.13  CALCIUM  9.0 8.1* 8.1* 7.4* 8.3*  MG  --   --  1.6* 1.9 1.6*  PHOS  --   --  3.2 3.1 3.2   Recent Labs  Lab 04/15/24 1558 04/16/24 0449  AST 23 35  ALT 11 16  ALKPHOS 104 98  BILITOT 1.9* 2.4*  PROT 7.3 6.4*  ALBUMIN  3.2* 2.9*   Recent Labs  Lab 04/15/24 1558 04/16/24 0449 04/17/24 0259 04/18/24 0225 04/19/24 0841 04/20/24 0638  WBC 2.1* 1.8* 1.7* 2.1* 2.0* 1.9*  NEUTROABS 1.0*  --   --   --   --   --   HGB 10.3* 9.3* 10.6* 8.0* 8.2* 8.4*  HCT 32.1* 27.7* 31.4* 23.3* 23.0* 23.8*  MCV 95.0 90.8 91.3 88.3 87.5 86.9  PLT 52* 45* 49* 42* 47* 49*   Recent Labs  Lab 04/15/24 1558  CKTOTAL 131   No results for input(s): LABPROT, INR in the last 72 hours.  No results for input(s): COLORURINE, LABSPEC, PHURINE, GLUCOSEU, HGBUR, BILIRUBINUR, KETONESUR, PROTEINUR, UROBILINOGEN, NITRITE, LEUKOCYTESUR in the last 72 hours.  Invalid input(s): APPERANCEUR     Component Value Date/Time   CHOL 125 04/16/2024 0449   TRIG  44 04/16/2024 0449   HDL 38 (L) 04/16/2024 0449   CHOLHDL 3.3 04/16/2024 0449   VLDL 9 04/16/2024 0449   LDLCALC 78 04/16/2024 0449   Lab Results  Component Value Date   HGBA1C 5.6 04/16/2024      Component Value Date/Time   LABOPIA NONE DETECTED 04/15/2024 1611   COCAINSCRNUR NONE DETECTED 04/15/2024 1611   LABBENZ NONE DETECTED 04/15/2024 1611   AMPHETMU NONE DETECTED 04/15/2024 1611   THCU NONE DETECTED 04/15/2024 1611   LABBARB NONE DETECTED 04/15/2024 1611    Recent Labs  Lab 04/15/24 1558  ETH <15    I have personally reviewed the radiological images below and agree with the radiology interpretations.  VAS US  CAROTID Result Date: 04/19/2024 Carotid Arterial Duplex Study Patient Name:  Christopher Roberts  Date of Exam:   04/17/2024 Medical Rec #: [id number]       Accession #:    [id number] Date of Birth: 1945/10/01       Patient Gender: M Patient Age:   8 years Exam Location:  [facility] Procedure:      VAS US  CAROTID Referring Phys: LUCIE RHYNE --------------------------------------------------------------------------------  Indications:       CVA and Carotid artery disease. Risk Factors:  Hypertension, hyperlipidemia, Diabetes, past history of                    smoking, prior MI, coronary artery disease. Comparison Study:  No previous carotid duplex exams. CTA on 04/26/2024 showed RT                    ICA >90% & LT ICA 60-70% Performing Technologist: Jody Hill RVT, RDMS  Examination Guidelines: A complete evaluation includes B-mode imaging, spectral Doppler, color Doppler, and power Doppler as needed of all accessible portions of each vessel. Bilateral testing is considered an integral part of a complete examination. Limited examinations for reoccurring indications may be performed as noted.  Right Carotid Findings: +----------+--------+--------+--------+---------------------+------------------+           PSV cm/sEDV cm/sStenosisPlaque Description   Comments            +----------+--------+--------+--------+---------------------+------------------+ CCA Prox  85      13                                                      +----------+--------+--------+--------+---------------------+------------------+ CCA Mid                           heterogenous                            +----------+--------+--------+--------+---------------------+------------------+ CCA Distal98      17              heterogenous         intimal thickening +----------+--------+--------+--------+---------------------+------------------+ ICA Prox  314     91      60-79%  calcific, diffuse and                                                     irregular                               +----------+--------+--------+--------+---------------------+------------------+ ICA Mid   252     58                                                      +----------+--------+--------+--------+---------------------+------------------+ ICA Distal83      23                                                      +----------+--------+--------+--------+---------------------+------------------+ ECA       129     0               calcific                                +----------+--------+--------+--------+---------------------+------------------+ +----------+--------+-------+----------------+-------------------+  PSV cm/sEDV cmsDescribe        Arm Pressure (mmHG) +----------+--------+-------+----------------+-------------------+ Subclavian203            Multiphasic, WNL                    +----------+--------+-------+----------------+-------------------+ +---------+--------+--+--------+--+---------+ VertebralPSV cm/s64EDV cm/s13Antegrade +---------+--------+--+--------+--+---------+  Left Carotid Findings: +----------+--------+--------+--------+-------------------------------+--------+           PSV cm/sEDV cm/sStenosisPlaque Description              Comments +----------+--------+--------+--------+-------------------------------+--------+ CCA Prox  106     20                                                      +----------+--------+--------+--------+-------------------------------+--------+ CCA Mid                           calcific and heterogenous               +----------+--------+--------+--------+-------------------------------+--------+ CCA Distal141     31                                                      +----------+--------+--------+--------+-------------------------------+--------+ ICA Prox  191     56      40-59%  calcific, irregular and diffuse         +----------+--------+--------+--------+-------------------------------+--------+ ICA Mid   124     39                                                      +----------+--------+--------+--------+-------------------------------+--------+ ICA Distal97      29                                                      +----------+--------+--------+--------+-------------------------------+--------+ ECA       80                      calcific and heterogenous               +----------+--------+--------+--------+-------------------------------+--------+ +----------+--------+--------+----------------+-------------------+           PSV cm/sEDV cm/sDescribe        Arm Pressure (mmHG) +----------+--------+--------+----------------+-------------------+ [id number]             Multiphasic, WNL                    +----------+--------+--------+----------------+-------------------+ +---------+--------+--+--------+--+---------+ VertebralPSV cm/s51EDV cm/s17Antegrade +---------+--------+--+--------+--+---------+   Summary: Right Carotid: Velocities in the right ICA are consistent with a 60-79% stenosis                (upper end of range). Left Carotid: Velocities in the left ICA are consistent with a 40-59% stenosis               (upper end of range).  Vertebrals:  Bilateral vertebral arteries demonstrate antegrade flow. Subclavians:  Normal flow hemodynamics were seen in bilateral subclavian              arteries. *See table(s) above for measurements and observations.  Electronically signed by Eather Popp MD on 04/19/2024 at 11:29:45 AM.    Final    ECHOCARDIOGRAM COMPLETE Result Date: 04/16/2024    ECHOCARDIOGRAM REPORT   Patient Name:   Christopher Roberts Date of Exam: 04/16/2024 Medical Rec #:  [id number]      Height:       69.0 in Accession #:    [id number]     Weight:       172.4 lb Date of Birth:  04/18/1946      BSA:          1.940 m Patient Age:    78 years       BP:           138/67 mmHg Patient Gender: M              HR:           74 bpm. Exam Location:  Inpatient Procedure: 2D Echo, Cardiac Doppler and Color Doppler (Both Spectral and Color            Flow Doppler were utilized during procedure). Indications:    Stroke 163.9  History:        Patient has prior history of Echocardiogram examinations, most                 recent 07/26/2020. CAD and Previous Myocardial Infarction,                 Stroke, Signs/Symptoms:Chest Pain and Shortness of Breath; Risk                 Factors:Dyslipidemia, Hypertension, Diabetes and Former Smoker.  Sonographer:    Merlynn Argyle Referring Phys: [phone] REDIA LOISE CLEAVER  Sonographer Comments: Image acquisition challenging due to respiratory motion. IMPRESSIONS  1. Left ventricular ejection fraction, by estimation, is 60 to 65%. The left ventricle has normal function. The left ventricle has no regional wall motion abnormalities. There is mild asymmetric left ventricular hypertrophy of the basal-septal segment. Left ventricular diastolic parameters are consistent with Grade I diastolic dysfunction (impaired relaxation).  2. Right ventricular systolic function is normal. The right ventricular size is mildly enlarged. There is normal pulmonary artery systolic pressure. The estimated right ventricular systolic pressure is 16.1  mmHg.  3. Left atrial size was moderately dilated.  4. The mitral valve is degenerative. Mild mitral valve regurgitation. No evidence of mitral stenosis. Moderate mitral annular calcification.  5. The aortic valve is tricuspid. There is mild calcification of the aortic valve. There is mild thickening of the aortic valve. Aortic valve regurgitation is trivial. Aortic valve sclerosis is present, with no evidence of aortic valve stenosis.  6. The inferior vena cava is normal in size with greater than 50% respiratory variability, suggesting right atrial pressure of 3 mmHg. Conclusion(s)/Recommendation(s): No intracardiac source of embolism detected on this transthoracic study. Consider a transesophageal echocardiogram to exclude cardiac source of embolism if clinically indicated. FINDINGS  Left Ventricle: Left ventricular ejection fraction, by estimation, is 60 to 65%. The left ventricle has normal function. The left ventricle has no regional wall motion abnormalities. The left ventricular internal cavity size was normal in size. There is  mild asymmetric left ventricular hypertrophy of the basal-septal segment. Left ventricular diastolic parameters are consistent with Grade I diastolic dysfunction (impaired relaxation). Right Ventricle: The  right ventricular size is mildly enlarged. No increase in right ventricular wall thickness. Right ventricular systolic function is normal. There is normal pulmonary artery systolic pressure. The tricuspid regurgitant velocity is 1.81  m/s, and with an assumed right atrial pressure of 3 mmHg, the estimated right ventricular systolic pressure is 16.1 mmHg. Left Atrium: Left atrial size was moderately dilated. Right Atrium: Right atrial size was normal in size. Pericardium: There is no evidence of pericardial effusion. Mitral Valve: The mitral valve is degenerative in appearance. There is mild thickening of the mitral valve leaflet(s). There is mild calcification of the mitral valve  leaflet(s). Moderate mitral annular calcification. Mild mitral valve regurgitation. No evidence of mitral valve stenosis. Tricuspid Valve: The tricuspid valve is normal in structure. Tricuspid valve regurgitation is trivial. No evidence of tricuspid stenosis. Aortic Valve: The aortic valve is tricuspid. There is mild calcification of the aortic valve. There is mild thickening of the aortic valve. Aortic valve regurgitation is trivial. Aortic valve sclerosis is present, with no evidence of aortic valve stenosis. Pulmonic Valve: The pulmonic valve was normal in structure. Pulmonic valve regurgitation is trivial. No evidence of pulmonic stenosis. Aorta: The aortic root is normal in size and structure. Venous: The inferior vena cava is normal in size with greater than 50% respiratory variability, suggesting right atrial pressure of 3 mmHg. IAS/Shunts: No atrial level shunt detected by color flow Doppler.  LEFT VENTRICLE PLAX 2D LVIDd:         4.60 cm   Diastology LVIDs:         3.40 cm   LV e' medial:    4.66 cm/s LV PW:         1.00 cm   LV E/e' medial:  19.7 LV IVS:        1.30 cm   LV e' lateral:   15.50 cm/s LVOT diam:     2.20 cm   LV E/e' lateral: 5.9 LV SV:         108 LV SV Index:   56 LVOT Area:     3.80 cm  RIGHT VENTRICLE RV Basal diam:  4.30 cm RV Mid diam:    3.50 cm RV S prime:     21.80 cm/s TAPSE (M-mode): 1.5 cm LEFT ATRIUM             Index        RIGHT ATRIUM           Index LA diam:        4.00 cm 2.06 cm/m   RA Area:     19.00 cm LA Vol (A2C):   71.7 ml 36.97 ml/m  RA Volume:   51.60 ml  26.60 ml/m LA Vol (A4C):   96.7 ml 49.85 ml/m LA Biplane Vol: 87.0 ml 44.85 ml/m  AORTIC VALVE LVOT Vmax:   127.00 cm/s LVOT Vmean:  94.800 cm/s LVOT VTI:    0.285 m  AORTA Ao Root diam: 3.70 cm Ao Asc diam:  3.70 cm MITRAL VALVE               TRICUSPID VALVE MV Area (PHT): 2.55 cm    TR Peak grad:   13.1 mmHg MV Decel Time: 297 msec    TR Vmax:        181.00 cm/s MV E velocity: 92.00 cm/s MV A velocity:  90.30 cm/s  SHUNTS MV E/A ratio:  1.02        Systemic VTI:  0.29 m  Systemic Diam: 2.20 cm Oneil Parchment MD Electronically signed by Oneil Parchment MD Signature Date/Time: 04/16/2024/11:50:06 AM    Final    CT ANGIO HEAD NECK W WO CM Result Date: 04/16/2024 EXAM: CTA HEAD AND NECK WITHOUT AND WITH 04/16/2024 05:41:14 AM TECHNIQUE: CTA of the head and neck was performed without and with the administration of 75 mL of iohexol  (OMNIPAQUE ) 350 MG/ML injection. Multiplanar 2D and/or 3D reformatted images are provided for review. Automated exposure control, iterative reconstruction, and/or weight based adjustment of the mA/kV was utilized to reduce the radiation dose to as low as reasonably achievable. Stenosis of the internal carotid arteries measured using NASCET criteria. COMPARISON: CT of the head dated 04/15/2024 CLINICAL HISTORY: Stroke/TIA, determine embolic source. FINDINGS: CTA NECK: AORTIC ARCH AND ARCH VESSELS: No dissection or arterial injury. No significant stenosis of the brachiocephalic or subclavian arteries. There is calcific atheromatous disease within the aortic arch. CERVICAL CAROTID ARTERIES: No dissection or arterial injury. There is moderate calcific plaque within the carotid bulbs and origins of the internal carotid arteries bilaterally. There is near occlusive stenosis of the proximal right internal carotid artery with estimated stenosis 90% or greater. There is extensive calcific plaque within the proximal left internal carotid artery with estimated stenosis approximately 60 to 70%. CERVICAL VERTEBRAL ARTERIES: No dissection, arterial injury, or significant stenosis. LUNGS AND MEDIASTINUM: Unremarkable. SOFT TISSUES: No acute abnormality. BONES: No acute abnormality. CTA HEAD: ANTERIOR CIRCULATION: There is moderately advanced calcific atheromatous disease within the carotid siphons. There is moderate stenosis of the cavernous and supraclinoid segments, approximately 40  to 50% bilaterally. No significant stenosis of the anterior cerebral arteries. No significant stenosis of the middle cerebral arteries. No aneurysm. POSTERIOR CIRCULATION: There is moderate-to-severe stenosis of the P2 segment of the left posterior cerebral artery and there is moderate stenosis of the P2 segment of the right posterior cerebral artery. The focal stenoses are demonstrated on images 104 and 106 of series 9, respectively. No significant stenosis of the basilar artery. There is moderately advanced calcific atheromatous disease within the vertebral arteries. No aneurysm. OTHER: There is age-related atrophy and moderately advanced cerebral white matter disease. Chronic encephalomalacia changes are noted within the frontal and parietal lobes bilaterally from remote cortical infarcts. Chronic infarcts are also noted within the cerebellar hemispheres bilaterally. No dural venous sinus thrombosis on this non-dedicated study. IMPRESSION: 1. 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 2. Extensive calcific plaque within the proximal left internal carotid artery with estimated stenosis approximately 60 to 70%. 3. Moderate-to-severe stenosis of the P2 segment of the left posterior cerebral artery and moderate stenosis of the P2 segment of the right posterior cerebral artery. 4. Moderate stenosis of the cavernous and supraclinoid segments, approximately 40 to 50% bilaterally. Electronically signed by: Evalene Coho MD 04/16/2024 06:14 AM EST RP Workstation: [id number]   MR BRAIN WO CONTRAST Result Date: 04/15/2024 EXAM: MRI BRAIN WITHOUT CONTRAST 04/15/2024 07:46:35 PM TECHNIQUE: Multiplanar multisequence MRI of the head/brain was performed without the administration of intravenous contrast. COMPARISON: CT head from earlier today. MRI head 08/30/2016. CLINICAL HISTORY: Neuro deficit, acute, stroke suspected FINDINGS: BRAIN AND VENTRICLES:  Punctate acute infarcts in the high right frontal and left parasagittal frontal lobes (series 2, images 39 and 37). No intracranial hemorrhage. No mass. No midline shift. No hydrocephalus. Advanced T2 hyperintensity in the white matter, compatible with chronic avascular ischemic disease. Remote lacunar infarcts in the corona radiata and bilateral parietal cortex. Multiple remote  cerebellar infarcts bilaterally. Normal flow voids. ORBITS: No acute abnormality. SINUSES AND MASTOIDS: No acute abnormality. BONES AND SOFT TISSUES: Normal marrow signal. No acute soft tissue abnormality. IMPRESSION: 1. Punctate acute infarcts in the high right frontal and left parasagittal frontal lobes. 2. 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 Electronically signed by: Gilmore Molt MD 04/15/2024 09:43 PM EST RP Workstation: [id number]   CT HEAD WO CONTRAST Result Date: 04/15/2024 CLINICAL DATA:  Clemens out of bed, neurologic deficit EXAM: CT HEAD WITHOUT CONTRAST TECHNIQUE: Contiguous axial images were obtained from the base of the skull through the vertex without intravenous contrast. RADIATION DOSE REDUCTION: This exam was performed according to the departmental dose-optimization program which includes automated exposure control, adjustment of the mA and/or kV according to patient size and/or use of iterative reconstruction technique. COMPARISON:  08/31/2016 FINDINGS: Brain: There are chronic cortical infarcts along the bilateral frontal and parietal convexities, as well as within the bilateral cerebellar hemispheres. Chronic small vessel ischemic changes are seen throughout the periventricular and subcortical white matter. No evidence of acute infarct or hemorrhage. Dense bilateral basal ganglia calcifications are again noted. Lateral ventricles and midline structures are otherwise unremarkable. No acute extra-axial fluid collections. No mass effect. Vascular: No hyperdense vessel or  unexpected calcification. Skull: Normal. Negative for fracture or focal lesion. Sinuses/Orbits: No acute finding. Other: None. IMPRESSION: 1. No acute intracranial process. 2. Chronic ischemic changes as above. Electronically Signed   By: Ozell Daring M.D.   On: 04/15/2024 17:50     PHYSICAL EXAM  Temp:  [97.4 F (36.3 C)-98.9 F (37.2 C)] 98.9 F (37.2 C) (12/14 0800) Pulse Rate:  [66-81] 69 (12/14 0800) Resp:  [17-21] 20 (12/14 0800) BP: (100-156)/(55-80) 104/60 (12/14 0800) SpO2:  [99 %-100 %] 100 % (12/14 0800)  General - Well nourished, well developed, in no apparent distress.  Mental Status -  Level of arousal and orientation to time, place, and person were intact. Language including expression, naming, repetition, comprehension was assessed and found intact. Attention span and concentration were normal. Recent and remote memory were intact. Fund of Knowledge was assessed and was intact.  Cranial Nerves II - XII - II - Visual field intact OU. III, IV, VI - Extraocular movements intact. V - Facial sensation intact bilaterally. VII - Facial movement intact bilaterally. VIII - Hearing & vestibular intact bilaterally. X - Palate elevates symmetrically. XI - Chin turning & shoulder shrug intact bilaterally. XII - Tongue protrusion intact.  Motor Strength - L arm and leg 5/5. R arm 5/5. R hip flexion 4/5, knee extension 5/5, dorsi and plantar flexion 4/5. Bulk was normal and fasciculations were absent. Overall improved significantly from yesterday.  Sensory - Light touch were assessed and were symmetrical.    Coordination -   Tremor was absent.  Gait and Station - deferred.   ASSESSMENT/PLAN Christopher Roberts is a 78 y.o. male with history of CAD s/p CABG, DM, DJD, HLD, HTN admitted for Acute R sided weakness. Imaging shows R ICA >90% stenosis, L ICA 60-70% stenosis, with likely L ICA related symptoms. Need to keep him from hypotension. VSS planning on TCAR on Monday  04/21/2024. Heme/Onc consulted for pancytopenia: recommended platelets >50k for procedure and Hgb >7.0 and to transfuse as needed.    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 2D Echo: EF 60-65%, LA moderately dilated, RA normal in size CUS R ICA 60-79% stenosis, L ICA 40-59% stenosis LDL 78 HgbA1c 5.9 UDS negative aspirin  81 mg daily and clopidogrel  75 mg daily prior to admission, now on aspirin  81 mg daily and clopidogrel  75 mg daily.  Further regimen per VVS Patient counseled to be compliant with his antithrombotic medications Ongoing aggressive stroke risk factor management Therapy recommendations: CIR Disposition: Pending  Fluctuating right-sided weakness 12/10 Reported by EMS patient was flaccid on right side, improved in the ER.  However this morning had worsening weakness on right side again when BP on the low end Likely due to flow-limiting stenosis of bilateral ICAs Off IV fluid Encourage p.o. intake Avoid low BP perioperatively  ICA stenosis CTA: R ICA Stenosis >90%, L ICA Stenosis 60-70% CUS R ICA 60-79% stenosis, L ICA 40-59% stenosis Although right stenosis more significant, however left stenosis more symptomatic Vascular surgery consulted, appreciate Dr. Gaylene assistance Plan for TCAR on 04/21/2024  Pancytopenia WBC 2.1--1.8--1.7 Hemoglobin 10.3-9.3-10.6 Platelet 52-45-49 Initial plan for bone marrow biopsy next week to rule out MDS Oncology consulted, recommended platelets >50k for procedure and Hgb >7.0 and to transfuse as needed.  Hypertension Home meds including Coreg  BP 120s now Hold off Coreg  for now Avoid low BP BP goal 140-160 before carotid  revascularization  Hyperlipidemia Home meds:  Lipitor  80mg  daily LDL 78, goal < 70 Now on Lipitor  80mg  daily Continue statin at discharge  Other Stroke Risk Factors Advanced age Coronary artery disease supposed CABG  Other medical issues AKI on CKD, creatinine 1.13-1.27-1.16 Depression on Prozac   Hospital day # 4  .  Patient has remained neurologically stable but still has mild right-sided weakness.  Continue mild permissive hypertension with systolic 130-150 range.  Continue dual antiplatelet therapy.  Plan for elective left carotid TCAR on Monday.              Eather Popp, MD Medical Director [facility] Pager: [phone] 04/20/2024 11:25 AM       To contact Stroke Continuity provider, please refer to [email or web address]. After hours, contact General Neurology

## 2024-04-20 NOTE — Progress Notes (Addendum)
 PROGRESS NOTE    Christopher Roberts  FMW:[id number] DOB: 1945/12/01 DOA: 04/15/2024 PCP: [name], [name], MD  No chief complaint on file.   Brief Narrative:    78 yrs old Male with PMH significant for CAD status post PCI in 2022, diabetes mellitus type 2, pancytopenia following up with Dr. Autumn oncologist and being worked up for possible MDS, hypertension who had Temari Schooler fall today morning when he tried to get up from the bed around 10 AM.  Patient lost balance and fell onto the floor and was on the floor for almost an hour when his family member found him.  Later noticed to have right lower extremity weakness and was brought in the ED.  EMS noted that his right leg was flaccid.   MRI brain shows features concerning for acute punctate stroke. Patient's right lower extremity slowly improving and is able to move at this time and his strength is around 3/ 5.  Neurologist was consulted and recommended stroke workup for acute stroke.  Patient was admitted for further evaluation.  Assessment & Plan:   Principal Problem:   Acute CVA (cerebrovascular accident) [facility]) Active Problems:   Hyperlipidemia   Essential hypertension   Coronary artery disease due to lipid rich plaque   Diabetes mellitus (HCC)   Cirrhosis of liver (HCC)  Acute CVA: R punctate MCA/ACA infarcts Patient presented with RLE weakness, MRI shows acute punctate stroke in high R frontal and L parasagittal frontal lobes.   Patient was evaluated by Neurology, L small ACA and R punctate MCA/ACA infarcts, likely due to large vessel disease from bilateral ICA stenosis - recommending DAPT, further regimen per [facility].  Lipitor  80 mg. Dysphagia III diet. CTA Head/ Neck showed right ICA stenosis > 90, left ICA stenosis  60 to 70% 2D echo shows LVEF 60 to 65%, LA moderately dilated. LDL 78 Near goal.  Hb A1c 5.9.  Drug screen negative. PT and OT evaluation.  Aggressive risk factor modification. Vascular surgery consulted for bilateral carotid  artery stenosis.    Bilateral carotid artery stenosis: Vascular surgery is consulted.  Carotid duplex > R ICA 60-79% stenosis, L ICA 40-59% stenosis  Vascular surgery consulted  Plan for Left TCAR on 04/21/2024.   Essential hypertension , now Hypotension: Given acute stroke , and low BP  hold antihypertensives. Goal SBP 140-160 before carotid revascularization   History of CAD status post PCI: S/p LHC 07/2020 with unsucessful PCI of SVG to PDA - at that time DAPT recommended Continue antiplatelet and statins, hold beta-blockers.   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  Goal platelets over 50,000 in case of active bleeding or interventional procedures.    History of SVT : He is being followed by cardiologist. HR controlled.   Diabetes mellitus type 2:  A1c 5.6 Ssi for now   Cirrhosis of liver : Imaging suggestive of cirrhosis Bili elevated, INR mildly elevated, platelets low, albumin  mildly low Needs further outpatient workup    Depression Continue fluoxetine .    DVT prophylaxis: SCD Code Status: full Family Communication: none Disposition:   Status is:  Inpatient Remains inpatient appropriate because: need for continued inpatient care   Consultants:  Neurology vascular  Procedures:  Carotid US   Echo IMPRESSIONS     1. Left ventricular ejection fraction, by estimation, is 60 to 65%. The  left ventricle has normal function. The left ventricle has no regional  wall motion abnormalities. There is mild asymmetric left ventricular  hypertrophy of the  basal-septal segment.  Left ventricular diastolic parameters are consistent with Grade I  diastolic dysfunction (impaired relaxation).   2. Right ventricular systolic function is normal. The right ventricular  size is mildly enlarged. There is normal pulmonary artery systolic  pressure. The estimated right ventricular systolic pressure is 16.1 mmHg.   3. Left atrial size was moderately dilated.   4. The mitral valve is degenerative. Mild mitral valve regurgitation. No  evidence of mitral stenosis. Moderate mitral annular calcification.   5. The aortic valve is tricuspid. There is mild calcification of the  aortic valve. There is mild thickening of the aortic valve. Aortic valve  regurgitation is trivial. Aortic valve sclerosis is present, with no  evidence of aortic valve stenosis.   6. The inferior vena cava is normal in size with greater than 50%  respiratory variability, suggesting right atrial pressure of 3 mmHg.   Conclusion(s)/Recommendation(s): No intracardiac source of embolism  detected on this transthoracic study. Consider Cadynce Garrette transesophageal  echocardiogram to exclude cardiac source of embolism if clinically  indicated.   Antimicrobials:  Anti-infectives (From admission, onward)    Start     Dose/Rate Route Frequency Ordered Stop   04/21/24 0000  ceFAZolin  (ANCEF ) IVPB 1 g/50 mL premix       Note to Pharmacy: Send with pt to OR   1 g 100 mL/hr over 30 Minutes Intravenous On call 04/20/24 1019 04/22/24 0000       Subjective: No complaints  Objective: Vitals:   04/20/24 0355 04/20/24 0800 04/20/24 1200 04/20/24 1600  BP: 107/61 104/60 100/60 (!) 132/59  Pulse: 72 69 66 68  Resp: 20 20 20 20   Temp: 98.8 F (37.1 C) 98.9 F (37.2 C) 98.6 F (37 C) 98.4 F (36.9 C)  TempSrc: Oral Oral Oral Oral  SpO2: 100% 100% 100% 100%  Weight:      Height:        Intake/Output Summary (Last 24 hours) at 04/20/2024 1714 Last data filed at 04/20/2024 9090 Gross per 24 hour   Intake 240 ml  Output 600 ml  Net -360 ml   Filed Weights   04/16/24 1847  Weight: 77.6 kg    Examination:  General exam: Appears calm and comfortable  Respiratory system: unlabored Cardiovascular system: RRR Gastrointestinal system: Abdomen is nondistended, soft and nontender. Central nervous system: RLE weakness Extremities:no LEE  Data Reviewed: I have personally reviewed following labs and imaging studies  CBC: Recent Labs  Lab 04/15/24 1558 04/16/24 0449 04/17/24 0259 04/18/24 0225 04/19/24 0841 04/20/24 0638  WBC 2.1* 1.8* 1.7* 2.1* 2.0* 1.9*  NEUTROABS 1.0*  --   --   --   --   --   HGB 10.3* 9.3* 10.6* 8.0* 8.2* 8.4*  HCT 32.1* 27.7* 31.4* 23.3* 23.0* 23.8*  MCV 95.0 90.8 91.3 88.3 87.5 86.9  PLT 52* 45* 49* 42* 47* 49*    Basic Metabolic Panel: Recent Labs  Lab 04/15/24 1558 04/16/24 0449 04/17/24 0259 04/18/24 0225 04/20/24 0638  NA 141 138 137 134* 138  K 4.0 4.4 3.9 3.4* 3.6  CL 112* 111 107 107  111  CO2 21* 21* 23 21* 18*  GLUCOSE 126* 137* 148* 122* 111*  BUN 14 14 15 18 16   CREATININE 1.13 1.27* 1.16 1.22 1.13  CALCIUM  9.0 8.1* 8.1* 7.4* 8.3*  MG  --   --  1.6* 1.9 1.6*  PHOS  --   --  3.2 3.1 3.2    GFR: Estimated Creatinine Clearance: 54.7 mL/min (by C-G formula based on SCr of 1.13 mg/dL).  Liver Function Tests: Recent Labs  Lab 04/15/24 1558 04/16/24 0449  AST 23 35  ALT 11 16  ALKPHOS 104 98  BILITOT 1.9* 2.4*  PROT 7.3 6.4*  ALBUMIN  3.2* 2.9*    CBG: Recent Labs  Lab 04/19/24 1614 04/19/24 2153 04/20/24 0619 04/20/24 1138 04/20/24 1534  GLUCAP 171* 92 135* 248* 156*     No results found for this or any previous visit (from the past 240 hours).       Radiology Studies: No results found.      Scheduled Meds:  aspirin   81 mg Oral Daily   atorvastatin   80 mg Oral q1800   clopidogrel   75 mg Oral Daily   feeding supplement  237 mL Oral BID BM   FLUoxetine   20 mg Oral Daily   insulin  aspart  0-9  Units Subcutaneous TID WC   midodrine   10 mg Oral TID WC   Continuous Infusions:  [START ON 04/21/2024]  ceFAZolin  (ANCEF ) IV       LOS: 4 days    Time spent: over 30 min     Meliton Monte, MD [facility]   To contact the attending provider between 7A-7P or the covering provider during after hours 7P-7A, please log into the web site [email or web address] and access using universal [facility] password for that web site. If you do not have the password, please call the hospital operator.  04/20/2024, 5:14 PM

## 2024-04-20 NOTE — Plan of Care (Signed)
   Problem: Coping: Goal: Ability to adjust to condition or change in health will improve Outcome: Progressing

## 2024-04-20 NOTE — Progress Notes (Signed)
° ° °  Subjective  - P  No acute overnight events   Physical Exam:  Right-sided weakness       Assessment/Plan:    Symptomatic left carotid stenosis: Plan is for left-sided TCAR tomorrow.  He will need to be n.p.o. after midnight.  Procedure discussed with patient.  Wife updated via telephone  Christopher Roberts 04/20/2024 10:41 AM --  Vitals:   04/20/24 0355 04/20/24 0800  BP: 107/61 104/60  Pulse: 72 69  Resp: 20 20  Temp: 98.8 F (37.1 C) 98.9 F (37.2 C)  SpO2: 100% 100%    Intake/Output Summary (Last 24 hours) at 04/20/2024 1041 Last data filed at 04/20/2024 0600 Gross per 24 hour  Intake 480 ml  Output 1850 ml  Net -1370 ml     Laboratory CBC    Component Value Date/Time   WBC 1.9 (L) 04/20/2024 0638   HGB 8.4 (L) 04/20/2024 0638   HGB 11.2 (L) 03/17/2024 1215   HGB 10.8 (L) 03/12/2024 1459   HCT 23.8 (L) 04/20/2024 0638   HCT 32.5 (L) 03/12/2024 1459   PLT 49 (L) 04/20/2024 0638   PLT 58 (L) 03/17/2024 1215   PLT 63 (LL) 03/12/2024 1459    BMET    Component Value Date/Time   NA 138 04/20/2024 0638   NA 141 03/12/2024 1459   K 3.6 04/20/2024 0638   CL 111 04/20/2024 0638   CO2 18 (L) 04/20/2024 0638   GLUCOSE 111 (H) 04/20/2024 0638   BUN 16 04/20/2024 0638   BUN 19 03/12/2024 1459   CREATININE 1.13 04/20/2024 0638   CREATININE 1.07 03/17/2024 1215   CALCIUM  8.3 (L) 04/20/2024 0638   GFRNONAA >60 04/20/2024 0638   GFRNONAA >60 03/17/2024 1215   GFRAA >60 08/31/2016 0443    COAG Lab Results  Component Value Date   INR 1.3 (H) 04/15/2024   INR 1.1 07/25/2020   INR 1.1 (H) 10/25/2010   No results found for: PTT  Antibiotics Anti-infectives (From admission, onward)    Start     Dose/Rate Route Frequency Ordered Stop   04/21/24 0000  ceFAZolin  (ANCEF ) IVPB 1 g/50 mL premix       Note to Pharmacy: Send with pt to OR   1 g 100 mL/hr over 30 Minutes Intravenous On call 04/20/24 1019 04/22/24 0000        V. Malvina Serene CLORE,  M.D., [facility] [place] Office: [phone] Pager:  [phone]

## 2024-04-21 ENCOUNTER — Inpatient Hospital Stay (HOSPITAL_COMMUNITY)

## 2024-04-21 ENCOUNTER — Encounter (HOSPITAL_COMMUNITY): Payer: Self-pay | Admitting: Internal Medicine

## 2024-04-21 ENCOUNTER — Encounter (HOSPITAL_COMMUNITY)
Admission: EM | Disposition: A | Discharge status: Rehabilitation Facility | Payer: Self-pay | Source: Home / Self Care | Attending: Family Medicine

## 2024-04-21 ENCOUNTER — Other Ambulatory Visit: Payer: Self-pay

## 2024-04-21 DIAGNOSIS — I6522 Occlusion and stenosis of left carotid artery: Secondary | ICD-10-CM

## 2024-04-21 DIAGNOSIS — I639 Cerebral infarction, unspecified: Secondary | ICD-10-CM

## 2024-04-21 DIAGNOSIS — I2510 Atherosclerotic heart disease of native coronary artery without angina pectoris: Secondary | ICD-10-CM

## 2024-04-21 DIAGNOSIS — I6523 Occlusion and stenosis of bilateral carotid arteries: Secondary | ICD-10-CM | POA: Diagnosis present

## 2024-04-21 DIAGNOSIS — Z87891 Personal history of nicotine dependence: Secondary | ICD-10-CM | POA: Diagnosis not present

## 2024-04-21 HISTORY — PX: TRANSCAROTID ARTERY REVASCULARIZATIONÂ: SHX6778

## 2024-04-21 LAB — BASIC METABOLIC PANEL WITH GFR
Anion gap: 8 (ref 5–15)
BUN: 18 mg/dL (ref 8–23)
CO2: 22 mmol/L (ref 22–32)
Calcium: 8.1 mg/dL — ABNORMAL LOW (ref 8.9–10.3)
Chloride: 109 mmol/L (ref 98–111)
Creatinine, Ser: 1.14 mg/dL (ref 0.61–1.24)
GFR, Estimated: >60 mL/min (ref >60)
Glucose, Bld: 105 mg/dL — ABNORMAL HIGH (ref 70–99)
Potassium: 3.8 mmol/L (ref 3.5–5.1)
Sodium: 139 mmol/L (ref 135–145)

## 2024-04-21 LAB — CBC
HCT: 23.4 % — ABNORMAL LOW (ref 39.0–52.0)
Hemoglobin: 8.1 g/dL — ABNORMAL LOW (ref 13.0–17.0)
MCH: 30.2 pg (ref 26.0–34.0)
MCHC: 34.6 g/dL (ref 30.0–36.0)
MCV: 87.3 fL (ref 80.0–100.0)
Platelets: 49 K/uL — ABNORMAL LOW (ref 150–400)
RBC: 2.68 MIL/uL — ABNORMAL LOW (ref 4.22–5.81)
RDW: 19.6 % — ABNORMAL HIGH (ref 11.5–15.5)
WBC: 2.4 K/uL — ABNORMAL LOW (ref 4.0–10.5)
nRBC: 0.8 % — ABNORMAL HIGH (ref 0.0–0.2)

## 2024-04-21 LAB — GLUCOSE, CAPILLARY
Glucose-Capillary: 117 mg/dL — ABNORMAL HIGH (ref 70–99)
Glucose-Capillary: 128 mg/dL — ABNORMAL HIGH (ref 70–99)
Glucose-Capillary: 135 mg/dL — ABNORMAL HIGH (ref 70–99)
Glucose-Capillary: 200 mg/dL — ABNORMAL HIGH (ref 70–99)
Glucose-Capillary: 274 mg/dL — ABNORMAL HIGH (ref 70–99)

## 2024-04-21 LAB — PREPARE RBC (CROSSMATCH)

## 2024-04-21 SURGERY — TRANSCAROTID ARTERY REVASCULARIZATION (TCAR)
Anesthesia: General | Site: Neck | Laterality: Left

## 2024-04-21 MED ORDER — IODIXANOL 320 MG/ML IV SOLN
INTRAVENOUS | Status: DC | PRN
  Administered 2024-04-21: 12:00:00 16 mL via INTRA_ARTERIAL

## 2024-04-21 MED ORDER — HYDROMORPHONE HCL 1 MG/ML IJ SOLN
INTRAMUSCULAR | Status: AC
  Filled 2024-04-21: qty 1

## 2024-04-21 MED ORDER — MIDAZOLAM HCL 2 MG/2ML IJ SOLN
INTRAMUSCULAR | Status: AC
  Filled 2024-04-21: qty 2

## 2024-04-21 MED ORDER — PROPOFOL 10 MG/ML IV BOLUS
INTRAVENOUS | Status: DC | PRN
  Administered 2024-04-21: 11:00:00 120 mg via INTRAVENOUS

## 2024-04-21 MED ORDER — PROTAMINE SULFATE 10 MG/ML IV SOLN
INTRAVENOUS | Status: DC | PRN
  Administered 2024-04-21: 12:00:00 50 mg via INTRAVENOUS

## 2024-04-21 MED ORDER — ROCURONIUM BROMIDE 10 MG/ML (PF) SYRINGE
PREFILLED_SYRINGE | INTRAVENOUS | Status: DC | PRN
  Administered 2024-04-21: 11:00:00 70 mg via INTRAVENOUS

## 2024-04-21 MED ORDER — SODIUM CHLORIDE 0.9 % IV SOLN: 250.0000 mL | INTRAVENOUS | Status: AC | PRN

## 2024-04-21 MED ORDER — SODIUM CHLORIDE 0.9 % IV SOLN: 500.0000 mL | Freq: Once | INTRAVENOUS | Status: DC | PRN

## 2024-04-21 MED ORDER — ONDANSETRON HCL 4 MG/2ML IJ SOLN
INTRAMUSCULAR | Status: DC | PRN
  Administered 2024-04-21: 11:00:00 4 mg via INTRAVENOUS

## 2024-04-21 MED ORDER — DOCUSATE SODIUM 100 MG PO CAPS
100.0000 mg | ORAL_CAPSULE | Freq: Every day | ORAL | Status: DC
  Administered 2024-04-22 – 2024-04-24 (×2): 100 mg via ORAL
  Filled 2024-04-21 (×3): qty 1

## 2024-04-21 MED ORDER — SODIUM CHLORIDE 0.9% FLUSH: 3.0000 mL | INTRAVENOUS | Status: DC | PRN

## 2024-04-21 MED ORDER — CHLORHEXIDINE GLUCONATE 0.12 % MT SOLN: 15.0000 mL | Freq: Once | OROMUCOSAL | Status: AC

## 2024-04-21 MED ORDER — ORAL CARE MOUTH RINSE: 15.0000 mL | Freq: Once | OROMUCOSAL | Status: AC

## 2024-04-21 MED ORDER — AMISULPRIDE (ANTIEMETIC) 5 MG/2ML IV SOLN: 10.0000 mg | Freq: Once | INTRAVENOUS | Status: DC | PRN

## 2024-04-21 MED ORDER — HEMOSTATIC AGENTS (NO CHARGE) OPTIME
TOPICAL | Status: DC | PRN
  Administered 2024-04-21: 12:00:00 1 via TOPICAL

## 2024-04-21 MED ORDER — ROCURONIUM BROMIDE 10 MG/ML (PF) SYRINGE
PREFILLED_SYRINGE | INTRAVENOUS | Status: AC
  Filled 2024-04-21: qty 10

## 2024-04-21 MED ORDER — PROPOFOL 10 MG/ML IV BOLUS
INTRAVENOUS | Status: AC
  Filled 2024-04-21: qty 20

## 2024-04-21 MED ORDER — HYDROMORPHONE HCL 1 MG/ML IJ SOLN
0.2500 mg | INTRAMUSCULAR | Status: DC | PRN
  Administered 2024-04-21: 13:00:00 0.25 mg via INTRAVENOUS

## 2024-04-21 MED ORDER — OXYCODONE HCL 5 MG/5ML PO SOLN: 5.0000 mg | Freq: Once | ORAL | Status: DC | PRN

## 2024-04-21 MED ORDER — LACTATED RINGERS IV SOLN: INTRAVENOUS | Status: DC

## 2024-04-21 MED ORDER — LIDOCAINE HCL (PF) 1 % IJ SOLN
INTRAMUSCULAR | Status: AC
  Filled 2024-04-21: qty 5

## 2024-04-21 MED ORDER — CEFAZOLIN SODIUM-DEXTROSE 2-4 GM/100ML-% IV SOLN
2.0000 g | Freq: Three times a day (TID) | INTRAVENOUS | Status: AC
  Administered 2024-04-21 – 2024-04-22 (×2): 2 g via INTRAVENOUS
  Filled 2024-04-21 (×2): qty 100

## 2024-04-21 MED ORDER — SODIUM CHLORIDE 0.9% FLUSH
3.0000 mL | Freq: Two times a day (BID) | INTRAVENOUS | Status: DC
  Administered 2024-04-21 – 2024-04-24 (×4): 3 mL via INTRAVENOUS

## 2024-04-21 MED ORDER — CHLORHEXIDINE GLUCONATE 0.12 % MT SOLN
OROMUCOSAL | Status: AC
  Administered 2024-04-21: 08:00:00 15 mL via OROMUCOSAL
  Filled 2024-04-21: qty 15

## 2024-04-21 MED ORDER — INSULIN ASPART 100 UNIT/ML IJ SOLN: 0.0000 [IU] | INTRAMUSCULAR | Status: DC | PRN

## 2024-04-21 MED ORDER — GLYCOPYRROLATE PF 0.2 MG/ML IJ SOSY
PREFILLED_SYRINGE | INTRAMUSCULAR | Status: AC
  Filled 2024-04-21: qty 1

## 2024-04-21 MED ORDER — SODIUM CHLORIDE 0.9 % IV SOLN
10.0000 mL/h | Freq: Once | INTRAVENOUS | Status: AC
  Administered 2024-04-21: 09:00:00 10 mL/h via INTRAVENOUS

## 2024-04-21 MED ORDER — LIDOCAINE 2% (20 MG/ML) 5 ML SYRINGE
INTRAMUSCULAR | Status: DC | PRN
  Administered 2024-04-21: 11:00:00 60 mg via INTRAVENOUS

## 2024-04-21 MED ORDER — DEXAMETHASONE SOD PHOSPHATE PF 10 MG/ML IJ SOLN
INTRAMUSCULAR | Status: DC | PRN
  Administered 2024-04-21: 11:00:00 4 mg via INTRAVENOUS

## 2024-04-21 MED ORDER — EPHEDRINE 5 MG/ML INJ
INTRAVENOUS | Status: AC
  Filled 2024-04-21: qty 5

## 2024-04-21 MED ORDER — HEPARIN 6000 UNIT IRRIGATION SOLUTION
Status: DC | PRN
  Administered 2024-04-21: 12:00:00 1

## 2024-04-21 MED ORDER — PHENYLEPHRINE 80 MCG/ML (10ML) SYRINGE FOR IV PUSH (FOR BLOOD PRESSURE SUPPORT)
PREFILLED_SYRINGE | INTRAVENOUS | Status: AC
  Filled 2024-04-21: qty 10

## 2024-04-21 MED ORDER — SUGAMMADEX SODIUM 200 MG/2ML IV SOLN
INTRAVENOUS | Status: DC | PRN
  Administered 2024-04-21: 12:00:00 100 mg via INTRAVENOUS
  Administered 2024-04-21: 12:00:00 200 mg via INTRAVENOUS

## 2024-04-21 MED ORDER — LIDOCAINE 2% (20 MG/ML) 5 ML SYRINGE
INTRAMUSCULAR | Status: AC
  Filled 2024-04-21: qty 5

## 2024-04-21 MED ORDER — PHENYLEPHRINE 80 MCG/ML (10ML) SYRINGE FOR IV PUSH (FOR BLOOD PRESSURE SUPPORT)
PREFILLED_SYRINGE | INTRAVENOUS | Status: DC | PRN
  Administered 2024-04-21: 12:00:00 160 ug via INTRAVENOUS
  Administered 2024-04-21 (×3): 80 ug via INTRAVENOUS

## 2024-04-21 MED ORDER — FENTANYL CITRATE (PF) 250 MCG/5ML IJ SOLN
INTRAMUSCULAR | Status: DC | PRN
  Administered 2024-04-21: 12:00:00 25 ug via INTRAVENOUS
  Administered 2024-04-21: 11:00:00 50 ug via INTRAVENOUS
  Administered 2024-04-21: 11:00:00 25 ug via INTRAVENOUS

## 2024-04-21 MED ORDER — ONDANSETRON HCL 4 MG/2ML IJ SOLN: 4.0000 mg | Freq: Once | INTRAMUSCULAR | Status: DC | PRN

## 2024-04-21 MED ORDER — LABETALOL HCL 5 MG/ML IV SOLN: 5.0000 mg | Freq: Once | INTRAVENOUS | Status: DC

## 2024-04-21 MED ORDER — ONDANSETRON HCL 4 MG/2ML IJ SOLN
INTRAMUSCULAR | Status: AC
  Filled 2024-04-21: qty 2

## 2024-04-21 MED ORDER — HEPARIN SODIUM (PORCINE) 1000 UNIT/ML IJ SOLN
INTRAMUSCULAR | Status: DC | PRN
  Administered 2024-04-21: 11:00:00 8000 [IU] via INTRAVENOUS
  Administered 2024-04-21: 11:00:00 2000 [IU] via INTRAVENOUS

## 2024-04-21 MED ORDER — SODIUM CHLORIDE 0.9 % IV SOLN: INTRAVENOUS | Status: DC | PRN

## 2024-04-21 MED ORDER — OXYCODONE HCL 5 MG PO TABS: 5.0000 mg | ORAL_TABLET | Freq: Once | ORAL | Status: DC | PRN

## 2024-04-21 MED ORDER — FENTANYL CITRATE (PF) 100 MCG/2ML IJ SOLN
INTRAMUSCULAR | Status: AC
  Filled 2024-04-21: qty 2

## 2024-04-21 MED ORDER — PHENYLEPHRINE HCL-NACL 20-0.9 MG/250ML-% IV SOLN
INTRAVENOUS | Status: DC | PRN
  Administered 2024-04-21: 11:00:00 40 ug/min via INTRAVENOUS

## 2024-04-21 MED ORDER — LABETALOL HCL 5 MG/ML IV SOLN
5.0000 mg | INTRAVENOUS | Status: AC | PRN
  Administered 2024-04-21 (×2): 5 mg via INTRAVENOUS

## 2024-04-21 MED ORDER — LABETALOL HCL 5 MG/ML IV SOLN
INTRAVENOUS | Status: AC
  Filled 2024-04-21: qty 4

## 2024-04-21 MED ORDER — EPHEDRINE SULFATE-NACL 50-0.9 MG/10ML-% IV SOSY
PREFILLED_SYRINGE | INTRAVENOUS | Status: DC | PRN
  Administered 2024-04-21 (×3): 5 mg via INTRAVENOUS

## 2024-04-21 MED ORDER — GLYCOPYRROLATE 0.2 MG/ML IJ SOLN
INTRAMUSCULAR | Status: DC | PRN
  Administered 2024-04-21: 11:00:00 .1 mg via INTRAVENOUS

## 2024-04-21 MED ORDER — HEPARIN 6000 UNIT IRRIGATION SOLUTION
Status: AC
  Filled 2024-04-21: qty 500

## 2024-04-21 SURGICAL SUPPLY — 40 items
BAG BANDED W/RUBBER/TAPE 36X54 (MISCELLANEOUS) ×1 IMPLANT
BAG COUNTER SPONGE SURGICOUNT (BAG) ×1 IMPLANT
CANISTER SUCTION 3000ML PPV (SUCTIONS) ×1 IMPLANT
CATH BALLN ENROUTE 6X25 (CATHETERS) IMPLANT
CLIP TI MEDIUM 6 (CLIP) ×1 IMPLANT
CLIP TI WIDE RED SMALL 6 (CLIP) ×1 IMPLANT
COVER DOME SNAP 22 D (MISCELLANEOUS) ×1 IMPLANT
COVER PROBE W GEL 5X96 (DRAPES) ×1 IMPLANT
DERMABOND ADVANCED .7 DNX12 (GAUZE/BANDAGES/DRESSINGS) ×1 IMPLANT
DRAPE C-ARM 42X72 X-RAY (DRAPES) IMPLANT
DRAPE FEMORAL ANGIO 80X135IN (DRAPES) ×1 IMPLANT
ELECTRODE REM PT RTRN 9FT ADLT (ELECTROSURGICAL) ×1 IMPLANT
GLOVE BIO SURGEON STRL SZ7.5 (GLOVE) ×1 IMPLANT
GOWN STRL REUS W/ TWL LRG LVL3 (GOWN DISPOSABLE) ×2 IMPLANT
GOWN STRL REUS W/ TWL XL LVL3 (GOWN DISPOSABLE) ×1 IMPLANT
GUIDEWIRE ENROUTE 0.014 (WIRE) ×1 IMPLANT
HEMOSTAT SNOW SURGICEL 2X4 (HEMOSTASIS) IMPLANT
KIT BASIN OR (CUSTOM PROCEDURE TRAY) ×1 IMPLANT
KIT ENCORE 26 ADVANTAGE (KITS) ×1 IMPLANT
KIT INTRODUCER GALT 7 (INTRODUCER) ×1 IMPLANT
KIT TURNOVER KIT B (KITS) ×1 IMPLANT
LOOP VESSEL MINI RED (MISCELLANEOUS) IMPLANT
MARKER SKIN DUAL TIP RULER LAB (MISCELLANEOUS) IMPLANT
NDL HYPO 25GX1X1/2 BEV (NEEDLE) IMPLANT
NEEDLE HYPO 25GX1X1/2 BEV (NEEDLE) IMPLANT
PACK CAROTID (CUSTOM PROCEDURE TRAY) ×1 IMPLANT
POSITIONER HEAD DONUT 9IN (MISCELLANEOUS) ×1 IMPLANT
SET MICROPUNCTURE 5F STIFF (MISCELLANEOUS) ×1 IMPLANT
SOLN STERILE WATER BTL 1000 ML (IV SOLUTION) ×1 IMPLANT
STENT TRANSCAROTID SYSTEM 9X40 (Permanent Stent) IMPLANT
SUT MNCRL AB 4-0 PS2 18 (SUTURE) ×1 IMPLANT
SUT PROLENE 5 0 C 1 24 (SUTURE) ×1 IMPLANT
SUT SILK 2 0 PERMA HAND 18 BK (SUTURE) ×1 IMPLANT
SUT VIC AB 3-0 SH 27X BRD (SUTURE) ×1 IMPLANT
SYR 10ML LL (SYRINGE) ×3 IMPLANT
SYR 20ML LL LF (SYRINGE) ×1 IMPLANT
SYR CONTROL 10ML LL (SYRINGE) IMPLANT
SYSTEM TRANSCAROTID NEUROPRTCT (MISCELLANEOUS) ×1 IMPLANT
TOWEL GREEN STERILE (TOWEL DISPOSABLE) ×1 IMPLANT
WIRE BENTSON .035X145CM (WIRE) ×1 IMPLANT

## 2024-04-21 NOTE — Progress Notes (Addendum)
 STROKE TEAM PROGRESS NOTE   SUBJECTIVE (INTERVAL HISTORY)  Pt lying comfortably in PACU after his TCAR. He was slightly sedated, but able to follow commands. No change in his neuro exam today, no tongue deviation. Neurological exam is unchanged.  Vital signs are stable. OBJECTIVE Temp:  [97.7 F (36.5 C)-98.8 F (37.1 C)] 98 F (36.7 C) (12/15 1218) Pulse Rate:  [66-103] 80 (12/15 1245) Cardiac Rhythm: Normal sinus rhythm (12/15 1245) Resp:  [14-23] 15 (12/15 1245) BP: (100-132)/(55-83) 123/75 (12/15 1245) SpO2:  [96 %-100 %] 96 % (12/15 1245) Arterial Line BP: (132-153)/(50-56) 153/56 (12/15 1245)  Recent Labs  Lab 04/20/24 1534 04/20/24 2113 04/21/24 0615 04/21/24 0900 04/21/24 1218  GLUCAP 156* 110* 117* 128* 135*   Recent Labs  Lab 04/16/24 0449 04/17/24 0259 04/18/24 0225 04/20/24 0638 04/21/24 0404  NA 138 137 134* 138 139  K 4.4 3.9 3.4* 3.6 3.8  CL 111 107 107 111 109  CO2 21* 23 21* 18* 22  GLUCOSE 137* 148* 122* 111* 105*  BUN 14 15 18 16 18   CREATININE 1.27* 1.16 1.22 1.13 1.14  CALCIUM  8.1* 8.1* 7.4* 8.3* 8.1*  MG  --  1.6* 1.9 1.6*  --   PHOS  --  3.2 3.1 3.2  --    Recent Labs  Lab 04/15/24 1558 04/16/24 0449  AST 23 35  ALT 11 16  ALKPHOS 104 98  BILITOT 1.9* 2.4*  PROT 7.3 6.4*  ALBUMIN  3.2* 2.9*   Recent Labs  Lab 04/15/24 1558 04/16/24 0449 04/17/24 0259 04/18/24 0225 04/19/24 0841 04/20/24 0638 04/21/24 0404  WBC 2.1*   < > 1.7* 2.1* 2.0* 1.9* 2.4*  NEUTROABS 1.0*  --   --   --   --   --   --   HGB 10.3*   < > 10.6* 8.0* 8.2* 8.4* 8.1*  HCT 32.1*   < > 31.4* 23.3* 23.0* 23.8* 23.4*  MCV 95.0   < > 91.3 88.3 87.5 86.9 87.3  PLT 52*   < > 49* 42* 47* 49* 49*   < > = values in this interval not displayed.   Recent Labs  Lab 04/15/24 1558  CKTOTAL 131   No results for input(s): LABPROT, INR in the last 72 hours.  No results for input(s): COLORURINE, LABSPEC, PHURINE, GLUCOSEU, HGBUR, BILIRUBINUR,  KETONESUR, PROTEINUR, UROBILINOGEN, NITRITE, LEUKOCYTESUR in the last 72 hours.  Invalid input(s): APPERANCEUR     Component Value Date/Time   CHOL 125 04/16/2024 0449   TRIG 44 04/16/2024 0449   HDL 38 (L) 04/16/2024 0449   CHOLHDL 3.3 04/16/2024 0449   VLDL 9 04/16/2024 0449   LDLCALC 78 04/16/2024 0449   Lab Results  Component Value Date   HGBA1C 5.6 04/16/2024      Component Value Date/Time   LABOPIA NONE DETECTED 04/15/2024 1611   COCAINSCRNUR NONE DETECTED 04/15/2024 1611   LABBENZ NONE DETECTED 04/15/2024 1611   AMPHETMU NONE DETECTED 04/15/2024 1611   THCU NONE DETECTED 04/15/2024 1611   LABBARB NONE DETECTED 04/15/2024 1611    Recent Labs  Lab 04/15/24 1558  ETH <15    I have personally reviewed the radiological images below and agree with the radiology interpretations.  VAS US  CAROTID Result Date: 04/19/2024 Carotid Arterial Duplex Study Patient Name:  Christopher Roberts  Date of Exam:   04/17/2024 Medical Rec #: [id number]       Accession #:    [id number] Date of Birth: 31-Jul-1945  Patient Gender: M Patient Age:   78 years Exam Location:  [facility] Procedure:      VAS US  CAROTID Referring Phys: LUCIE RHYNE --------------------------------------------------------------------------------  Indications:       CVA and Carotid artery disease. Risk Factors:      Hypertension, hyperlipidemia, Diabetes, past history of                    smoking, prior MI, coronary artery disease. Comparison Study:  No previous carotid duplex exams. CTA on 04/26/2024 showed RT                    ICA >90% & LT ICA 60-70% Performing Technologist: Jody Hill RVT, RDMS  Examination Guidelines: A complete evaluation includes B-mode imaging, spectral Doppler, color Doppler, and power Doppler as needed of all accessible portions of each vessel. Bilateral testing is considered an integral part of a complete examination. Limited examinations for reoccurring indications may be  performed as noted.  Right Carotid Findings: +----------+--------+--------+--------+---------------------+------------------+           PSV cm/sEDV cm/sStenosisPlaque Description   Comments           +----------+--------+--------+--------+---------------------+------------------+ CCA Prox  85      13                                                      +----------+--------+--------+--------+---------------------+------------------+ CCA Mid                           heterogenous                            +----------+--------+--------+--------+---------------------+------------------+ CCA Distal98      17              heterogenous         intimal thickening +----------+--------+--------+--------+---------------------+------------------+ ICA Prox  314     91      60-79%  calcific, diffuse and                                                     irregular                               +----------+--------+--------+--------+---------------------+------------------+ ICA Mid   252     58                                                      +----------+--------+--------+--------+---------------------+------------------+ ICA Distal83      23                                                      +----------+--------+--------+--------+---------------------+------------------+ ECA       129     0  calcific                                +----------+--------+--------+--------+---------------------+------------------+ +----------+--------+-------+----------------+-------------------+           PSV cm/sEDV cmsDescribe        Arm Pressure (mmHG) +----------+--------+-------+----------------+-------------------+ Subclavian203            Multiphasic, WNL                    +----------+--------+-------+----------------+-------------------+ +---------+--------+--+--------+--+---------+ VertebralPSV cm/s64EDV cm/s13Antegrade  +---------+--------+--+--------+--+---------+  Left Carotid Findings: +----------+--------+--------+--------+-------------------------------+--------+           PSV cm/sEDV cm/sStenosisPlaque Description             Comments +----------+--------+--------+--------+-------------------------------+--------+ CCA Prox  106     20                                                      +----------+--------+--------+--------+-------------------------------+--------+ CCA Mid                           calcific and heterogenous               +----------+--------+--------+--------+-------------------------------+--------+ CCA Distal141     31                                                      +----------+--------+--------+--------+-------------------------------+--------+ ICA Prox  191     56      40-59%  calcific, irregular and diffuse         +----------+--------+--------+--------+-------------------------------+--------+ ICA Mid   124     39                                                      +----------+--------+--------+--------+-------------------------------+--------+ ICA Distal97      29                                                      +----------+--------+--------+--------+-------------------------------+--------+ ECA       80                      calcific and heterogenous               +----------+--------+--------+--------+-------------------------------+--------+ +----------+--------+--------+----------------+-------------------+           PSV cm/sEDV cm/sDescribe        Arm Pressure (mmHG) +----------+--------+--------+----------------+-------------------+ [id number]             Multiphasic, WNL                    +----------+--------+--------+----------------+-------------------+ +---------+--------+--+--------+--+---------+ VertebralPSV cm/s51EDV cm/s17Antegrade +---------+--------+--+--------+--+---------+   Summary: Right  Carotid: Velocities in the right ICA are consistent with a 60-79% stenosis                (  upper end of range). Left Carotid: Velocities in the left ICA are consistent with a 40-59% stenosis               (upper end of range). Vertebrals:  Bilateral vertebral arteries demonstrate antegrade flow. Subclavians: Normal flow hemodynamics were seen in bilateral subclavian              arteries. *See table(s) above for measurements and observations.  Electronically signed by Eather Popp MD on 04/19/2024 at 11:29:45 AM.    Final    ECHOCARDIOGRAM COMPLETE Result Date: 04/16/2024    ECHOCARDIOGRAM REPORT   Patient Name:   Christopher Roberts Date of Exam: 04/16/2024 Medical Rec #:  [id number]      Height:       69.0 in Accession #:    [id number]     Weight:       172.4 lb Date of Birth:  January 21, 1946      BSA:          1.940 m Patient Age:    78 years       BP:           138/67 mmHg Patient Gender: M              HR:           74 bpm. Exam Location:  Inpatient Procedure: 2D Echo, Cardiac Doppler and Color Doppler (Both Spectral and Color            Flow Doppler were utilized during procedure). Indications:    Stroke 163.9  History:        Patient has prior history of Echocardiogram examinations, most                 recent 07/26/2020. CAD and Previous Myocardial Infarction,                 Stroke, Signs/Symptoms:Chest Pain and Shortness of Breath; Risk                 Factors:Dyslipidemia, Hypertension, Diabetes and Former Smoker.  Sonographer:    Merlynn Argyle Referring Phys: [phone] REDIA LOISE CLEAVER  Sonographer Comments: Image acquisition challenging due to respiratory motion. IMPRESSIONS  1. Left ventricular ejection fraction, by estimation, is 60 to 65%. The left ventricle has normal function. The left ventricle has no regional wall motion abnormalities. There is mild asymmetric left ventricular hypertrophy of the basal-septal segment. Left ventricular diastolic parameters are consistent with Grade I diastolic dysfunction  (impaired relaxation).  2. Right ventricular systolic function is normal. The right ventricular size is mildly enlarged. There is normal pulmonary artery systolic pressure. The estimated right ventricular systolic pressure is 16.1 mmHg.  3. Left atrial size was moderately dilated.  4. The mitral valve is degenerative. Mild mitral valve regurgitation. No evidence of mitral stenosis. Moderate mitral annular calcification.  5. The aortic valve is tricuspid. There is mild calcification of the aortic valve. There is mild thickening of the aortic valve. Aortic valve regurgitation is trivial. Aortic valve sclerosis is present, with no evidence of aortic valve stenosis.  6. The inferior vena cava is normal in size with greater than 50% respiratory variability, suggesting right atrial pressure of 3 mmHg. Conclusion(s)/Recommendation(s): No intracardiac source of embolism detected on this transthoracic study. Consider a transesophageal echocardiogram to exclude cardiac source of embolism if clinically indicated. FINDINGS  Left Ventricle: Left ventricular ejection fraction, by estimation, is 60 to 65%. The left ventricle has normal function. The left ventricle  has no regional wall motion abnormalities. The left ventricular internal cavity size was normal in size. There is  mild asymmetric left ventricular hypertrophy of the basal-septal segment. Left ventricular diastolic parameters are consistent with Grade I diastolic dysfunction (impaired relaxation). Right Ventricle: The right ventricular size is mildly enlarged. No increase in right ventricular wall thickness. Right ventricular systolic function is normal. There is normal pulmonary artery systolic pressure. The tricuspid regurgitant velocity is 1.81  m/s, and with an assumed right atrial pressure of 3 mmHg, the estimated right ventricular systolic pressure is 16.1 mmHg. Left Atrium: Left atrial size was moderately dilated. Right Atrium: Right atrial size was normal in  size. Pericardium: There is no evidence of pericardial effusion. Mitral Valve: The mitral valve is degenerative in appearance. There is mild thickening of the mitral valve leaflet(s). There is mild calcification of the mitral valve leaflet(s). Moderate mitral annular calcification. Mild mitral valve regurgitation. No evidence of mitral valve stenosis. Tricuspid Valve: The tricuspid valve is normal in structure. Tricuspid valve regurgitation is trivial. No evidence of tricuspid stenosis. Aortic Valve: The aortic valve is tricuspid. There is mild calcification of the aortic valve. There is mild thickening of the aortic valve. Aortic valve regurgitation is trivial. Aortic valve sclerosis is present, with no evidence of aortic valve stenosis. Pulmonic Valve: The pulmonic valve was normal in structure. Pulmonic valve regurgitation is trivial. No evidence of pulmonic stenosis. Aorta: The aortic root is normal in size and structure. Venous: The inferior vena cava is normal in size with greater than 50% respiratory variability, suggesting right atrial pressure of 3 mmHg. IAS/Shunts: No atrial level shunt detected by color flow Doppler.  LEFT VENTRICLE PLAX 2D LVIDd:         4.60 cm   Diastology LVIDs:         3.40 cm   LV e' medial:    4.66 cm/s LV PW:         1.00 cm   LV E/e' medial:  19.7 LV IVS:        1.30 cm   LV e' lateral:   15.50 cm/s LVOT diam:     2.20 cm   LV E/e' lateral: 5.9 LV SV:         108 LV SV Index:   56 LVOT Area:     3.80 cm  RIGHT VENTRICLE RV Basal diam:  4.30 cm RV Mid diam:    3.50 cm RV S prime:     21.80 cm/s TAPSE (M-mode): 1.5 cm LEFT ATRIUM             Index        RIGHT ATRIUM           Index LA diam:        4.00 cm 2.06 cm/m   RA Area:     19.00 cm LA Vol (A2C):   71.7 ml 36.97 ml/m  RA Volume:   51.60 ml  26.60 ml/m LA Vol (A4C):   96.7 ml 49.85 ml/m LA Biplane Vol: 87.0 ml 44.85 ml/m  AORTIC VALVE LVOT Vmax:   127.00 cm/s LVOT Vmean:  94.800 cm/s LVOT VTI:    0.285 m  AORTA Ao Root  diam: 3.70 cm Ao Asc diam:  3.70 cm MITRAL VALVE               TRICUSPID VALVE MV Area (PHT): 2.55 cm    TR Peak grad:   13.1 mmHg MV Decel Time: 297 msec  TR Vmax:        181.00 cm/s MV E velocity: 92.00 cm/s MV A velocity: 90.30 cm/s  SHUNTS MV E/A ratio:  1.02        Systemic VTI:  0.29 m                            Systemic Diam: 2.20 cm Oneil Parchment MD Electronically signed by Oneil Parchment MD Signature Date/Time: 04/16/2024/11:50:06 AM    Final    CT ANGIO HEAD NECK W WO CM Result Date: 04/16/2024 EXAM: CTA HEAD AND NECK WITHOUT AND WITH 04/16/2024 05:41:14 AM TECHNIQUE: CTA of the head and neck was performed without and with the administration of 75 mL of iohexol  (OMNIPAQUE ) 350 MG/ML injection. Multiplanar 2D and/or 3D reformatted images are provided for review. Automated exposure control, iterative reconstruction, and/or weight based adjustment of the mA/kV was utilized to reduce the radiation dose to as low as reasonably achievable. Stenosis of the internal carotid arteries measured using NASCET criteria. COMPARISON: CT of the head dated 04/15/2024 CLINICAL HISTORY: Stroke/TIA, determine embolic source. FINDINGS: CTA NECK: AORTIC ARCH AND ARCH VESSELS: No dissection or arterial injury. No significant stenosis of the brachiocephalic or subclavian arteries. There is calcific atheromatous disease within the aortic arch. CERVICAL CAROTID ARTERIES: No dissection or arterial injury. There is moderate calcific plaque within the carotid bulbs and origins of the internal carotid arteries bilaterally. There is near occlusive stenosis of the proximal right internal carotid artery with estimated stenosis 90% or greater. There is extensive calcific plaque within the proximal left internal carotid artery with estimated stenosis approximately 60 to 70%. CERVICAL VERTEBRAL ARTERIES: No dissection, arterial injury, or significant stenosis. LUNGS AND MEDIASTINUM: Unremarkable. SOFT TISSUES: No acute abnormality. BONES:  No acute abnormality. CTA HEAD: ANTERIOR CIRCULATION: There is moderately advanced calcific atheromatous disease within the carotid siphons. There is moderate stenosis of the cavernous and supraclinoid segments, approximately 40 to 50% bilaterally. No significant stenosis of the anterior cerebral arteries. No significant stenosis of the middle cerebral arteries. No aneurysm. POSTERIOR CIRCULATION: There is moderate-to-severe stenosis of the P2 segment of the left posterior cerebral artery and there is moderate stenosis of the P2 segment of the right posterior cerebral artery. The focal stenoses are demonstrated on images 104 and 106 of series 9, respectively. No significant stenosis of the basilar artery. There is moderately advanced calcific atheromatous disease within the vertebral arteries. No aneurysm. OTHER: There is age-related atrophy and moderately advanced cerebral white matter disease. Chronic encephalomalacia changes are noted within the frontal and parietal lobes bilaterally from remote cortical infarcts. Chronic infarcts are also noted within the cerebellar hemispheres bilaterally. No dural venous sinus thrombosis on this non-dedicated study. IMPRESSION: 1. 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 2. Extensive calcific plaque within the proximal left internal carotid artery with estimated stenosis approximately 60 to 70%. 3. Moderate-to-severe stenosis of the P2 segment of the left posterior cerebral artery and moderate stenosis of the P2 segment of the right posterior cerebral artery. 4. Moderate stenosis of the cavernous and supraclinoid segments, approximately 40 to 50% bilaterally. Electronically signed by: Evalene Coho MD 04/16/2024 06:14 AM EST RP Workstation: [id number]   MR BRAIN WO CONTRAST Result Date: 04/15/2024 EXAM: MRI BRAIN WITHOUT CONTRAST 04/15/2024 07:46:35 PM TECHNIQUE: Multiplanar multisequence MRI of the  head/brain was performed without the administration of intravenous contrast. COMPARISON: CT head from earlier today. MRI head  08/30/2016. CLINICAL HISTORY: Neuro deficit, acute, stroke suspected FINDINGS: BRAIN AND VENTRICLES: Punctate acute infarcts in the high right frontal and left parasagittal frontal lobes (series 2, images 39 and 37). No intracranial hemorrhage. No mass. No midline shift. No hydrocephalus. Advanced T2 hyperintensity in the white matter, compatible with chronic avascular ischemic disease. Remote lacunar infarcts in the corona radiata and bilateral parietal cortex. Multiple remote cerebellar infarcts bilaterally. Normal flow voids. ORBITS: No acute abnormality. SINUSES AND MASTOIDS: No acute abnormality. BONES AND SOFT TISSUES: Normal marrow signal. No acute soft tissue abnormality. IMPRESSION: 1. Punctate acute infarcts in the high right frontal and left parasagittal frontal lobes. 2. 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 Electronically signed by: Gilmore Molt MD 04/15/2024 09:43 PM EST RP Workstation: [id number]   CT HEAD WO CONTRAST Result Date: 04/15/2024 CLINICAL DATA:  Clemens out of bed, neurologic deficit EXAM: CT HEAD WITHOUT CONTRAST TECHNIQUE: Contiguous axial images were obtained from the base of the skull through the vertex without intravenous contrast. RADIATION DOSE REDUCTION: This exam was performed according to the departmental dose-optimization program which includes automated exposure control, adjustment of the mA and/or kV according to patient size and/or use of iterative reconstruction technique. COMPARISON:  08/31/2016 FINDINGS: Brain: There are chronic cortical infarcts along the bilateral frontal and parietal convexities, as well as within the bilateral cerebellar hemispheres. Chronic small vessel ischemic changes are seen throughout the periventricular and subcortical white matter. No evidence of acute infarct or  hemorrhage. Dense bilateral basal ganglia calcifications are again noted. Lateral ventricles and midline structures are otherwise unremarkable. No acute extra-axial fluid collections. No mass effect. Vascular: No hyperdense vessel or unexpected calcification. Skull: Normal. Negative for fracture or focal lesion. Sinuses/Orbits: No acute finding. Other: None. IMPRESSION: 1. No acute intracranial process. 2. Chronic ischemic changes as above. Electronically Signed   By: Ozell Daring M.D.   On: 04/15/2024 17:50     PHYSICAL EXAM  Temp:  [97.7 F (36.5 C)-98.8 F (37.1 C)] 98 F (36.7 C) (12/15 1218) Pulse Rate:  [66-103] 80 (12/15 1245) Resp:  [14-23] 15 (12/15 1245) BP: (100-132)/(55-83) 123/75 (12/15 1245) SpO2:  [96 %-100 %] 96 % (12/15 1245) Arterial Line BP: (132-153)/(50-56) 153/56 (12/15 1245)  General - Well nourished, well developed, in no apparent distress.  Mental Status -  Level of arousal and orientation to time, place, and person were intact. Language including expression, naming, repetition, comprehension was assessed and found intact. Attention span and concentration were normal. Recent and remote memory were intact. Fund of Knowledge was assessed and was intact.  Cranial Nerves II - XII - II - Visual field intact OU. III, IV, VI - Extraocular movements intact. V - Facial sensation intact bilaterally. VII - Facial movement intact bilaterally. VIII - Hearing & vestibular intact bilaterally. X - Palate elevates symmetrically. XI - Chin turning & shoulder shrug intact bilaterally. XII - Tongue protrusion intact.  Motor Strength - L arm and leg 5/5. R arm 5/5. R hip flexion 4/5, knee extension 5/5, dorsi and plantar flexion 4/5. Bulk was normal and fasciculations were absent. Overall improved significantly from yesterday.  Sensory - Light touch were assessed and were symmetrical.    Coordination -   Tremor was absent.  Gait and Station -  deferred.   ASSESSMENT/PLAN Mr. Christopher Roberts is a 78 y.o. male with history of CAD s/p CABG, DM, DJD, HLD, HTN admitted for Acute R sided weakness. Imaging shows R ICA >90% stenosis, L ICA  60-70% stenosis, with likely L ICA related symptoms. Need to keep him from hypotension. VSS planning on TCAR on Monday 04/21/2024. Heme/Onc consulted for pancytopenia: recommended platelets >50k for procedure and Hgb >7.0 and to transfuse as needed.    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 2D Echo: EF 60-65%, LA moderately dilated, RA normal in size CUS R ICA 60-79% stenosis, L ICA 40-59% stenosis LDL 78 HgbA1c 5.9 UDS negative aspirin  81 mg daily and clopidogrel  75 mg daily prior to admission, now on aspirin  81 mg daily and clopidogrel  75 mg daily.  Further regimen per VVS Patient counseled to be compliant with his antithrombotic medications Ongoing aggressive stroke risk factor management Therapy recommendations: CIR Disposition: Pending  Fluctuating right-sided weakness 12/10 Reported by EMS patient was flaccid on right side, improved in the ER.  However this morning had worsening weakness on right side again when BP on the low end Likely due to flow-limiting stenosis of bilateral ICAs Off IV fluid Encourage p.o. intake Avoid low BP perioperatively  ICA stenosis CTA: R ICA Stenosis >90%, L ICA Stenosis 60-70% CUS R ICA 60-79% stenosis, L ICA 40-59% stenosis Although right stenosis more significant, however left stenosis more symptomatic Vascular surgery consulted, appreciate Dr. Gaylene assistance  Pancytopenia WBC 2.1--1.8--1.7 Hemoglobin 10.3-9.3-10.6 Platelet 52-45-49 Initial plan for bone  marrow biopsy next week to rule out MDS Oncology consulted, recommended platelets >50k for procedure and Hgb >7.0 and to transfuse as needed.  Hypertension Home meds including Coreg  BP 120s now Hold off Coreg  for now Avoid low BP BP goal 140-160 before carotid revascularization  Hyperlipidemia Home meds:  Lipitor  80mg  daily LDL 78, goal < 70 Now on Lipitor  80mg  daily Continue statin at discharge  Other Stroke Risk Factors Advanced age Coronary artery disease supposed CABG  Other medical issues AKI on CKD, creatinine 1.13-1.27-1.16 Depression on Prozac   Hospital day # 5  Patient has remained neurologically stable but still has mild right-sided weakness.  Continue mild permissive hypertension with systolic 130-150 range.  Continue dual antiplatelet therapy. Will continue to follow, may sign off tomorrow if continues to do well.       CHARM Penne Mori, DO, PGY1 Neurology Stroke Team  04/21/2024 12:56 PM   I have personally obtained history,examined this patient, reviewed notes, independently viewed imaging studies, participated in medical decision making and plan of care.ROS completed by me personally and pertinent positives fully documented  I have made any additions or clarifications directly to the above note. Agree with note above.  Patient had elective TCAR procedure today which went well.  Continue close neurological monitoring as per post TCAR protocol.  Continue dual antiplatelet therapy for now.  Aggressive risk factor modification.   I personally spent a total of 35 minutes in the care of the patient today including getting/reviewing separately obtained history, performing a medically appropriate exam/evaluation, counseling and educating, placing orders, referring and communicating with other health care professionals, documenting clinical information in the EHR, independently interpreting results, and coordinating care.         Eather Popp, MD Medical  Director [facility] Pager: [phone] 04/21/2024 1:39 PM     To contact Stroke Continuity provider, please refer to [email or web address].  After hours, contact General Neurology

## 2024-04-21 NOTE — Anesthesia Postprocedure Evaluation (Signed)
 Anesthesia Post Note  Patient: Christopher Roberts  Procedure(s) Performed: LEFT TRANSCAROTID ARTERY REVASCULARIZATION (TCAR) (Left: Neck)     Patient location during evaluation: PACU Anesthesia Type: General Level of consciousness: awake and alert, oriented and patient cooperative Pain management: pain level controlled Vital Signs Assessment: post-procedure vital signs reviewed and stable Respiratory status: spontaneous breathing, nonlabored ventilation and respiratory function stable Cardiovascular status: blood pressure returned to baseline and stable Postop Assessment: no apparent nausea or vomiting Anesthetic complications: no   No notable events documented.  Last Vitals:  Vitals:   04/21/24 1245 04/21/24 1300  BP: 123/75 123/66  Pulse: 80 81  Resp: 15 17  Temp:    SpO2: 96% 98%    Last Pain:  Vitals:   04/21/24 1245  TempSrc:   PainSc: 6                  Almarie CHRISTELLA Marchi

## 2024-04-21 NOTE — Progress Notes (Signed)
 [facility]  Subjective  -no complaints   Objective (!) 114/58 79 98.1 F (36.7 C) (Oral) 18 98%  Intake/Output Summary (Last 24 hours) at 04/21/2024 0910 Last data filed at 04/21/2024 0500 Gross per 24 hour  Intake 530 ml  Output 1150 ml  Net -620 ml    Left upper and lower extremity 5 out of 5 Right upper extremity 5 out of 5 and right lower extremity 4 out of 5  Laboratory Lab Results: Recent Labs    04/20/24 0638 04/21/24 0404  WBC 1.9* 2.4*  HGB 8.4* 8.1*  HCT 23.8* 23.4*  PLT 49* 49*   BMET Recent Labs    04/20/24 0638 04/21/24 0404  NA 138 139  K 3.6 3.8  CL 111 109  CO2 18* 22  GLUCOSE 111* 105*  BUN 16 18  CREATININE 1.13 1.14  CALCIUM  8.3* 8.1*    COAG Lab Results  Component Value Date   INR 1.3 (H) 04/15/2024   INR 1.1 07/25/2020   INR 1.1 (H) 10/25/2010   No results found for: PTT  Assessment/Planning:  Discussed plan for left TCAR given 60 to 70% ICA stenosis.  Patient has evidence of bilateral acute infarcts on MRI with 90% right ICA stenosis and 60 to 70% left ICA stenosis.  We have been asked to treat the left ICA first given he presented with right leg weakness and left felt to be more symptomatic.  Discussed 1% stroke risk along with other risk benefits.  We will transfuse platelets prior to surgery.  Wife updated at bedside.  All questions answered.  Plan left TCAR.  Lonni JINNY Gaskins 04/21/2024 9:10 AM --

## 2024-04-21 NOTE — Progress Notes (Signed)
° ° °  PROCEDURAL EXPEDITER PROGRESS NOTE  Patient Name: Christopher Roberts  DOB:May 16, 1945 Date of Admission: 04/15/2024  Date of Assessment:04/21/2024   ------------------------------------------------------------------------------------------------------------------- I spoke with OR desk and DR. Gretta is there and will clarify with Dr. Gretta which side left or right the procedure will be done on .    -------------------------------------------------------------------------------------------------------------------  [facility], [name] Please contact us  directly via secure chat (search for [facility]) or by calling us  at [phone] [facility]).

## 2024-04-21 NOTE — Progress Notes (Signed)
°  Progress Note    04/21/2024 4:07 PM * Day of Surgery *  Subjective:  left neck a little sore, some hoarseness in his voice   Vitals:   04/21/24 1400 04/21/24 1423  BP: 124/64 126/72  Pulse: 72 73  Resp: 15 16  Temp: 97.9 F (36.6 C) 98 F (36.7 C)  SpO2: 99% 98%   Physical Exam: Cardiac:  regular Lungs:  non labored Incisions:  left neck incision is clean, dry and intact without swelling or hematoma Extremities:  moving all extremities without deficits Abdomen:  soft Neurologic: alert and oriented. Speech coherent. Smile symmetric. Tongue midline  CBC    Component Value Date/Time   WBC 2.4 (L) 04/21/2024 0404   RBC 2.68 (L) 04/21/2024 0404   HGB 8.1 (L) 04/21/2024 0404   HGB 11.2 (L) 03/17/2024 1215   HGB 10.8 (L) 03/12/2024 1459   HCT 23.4 (L) 04/21/2024 0404   HCT 32.5 (L) 03/12/2024 1459   PLT 49 (L) 04/21/2024 0404   PLT 58 (L) 03/17/2024 1215   PLT 63 (LL) 03/12/2024 1459   MCV 87.3 04/21/2024 0404   MCV 90 03/12/2024 1459   MCH 30.2 04/21/2024 0404   MCHC 34.6 04/21/2024 0404   RDW 19.6 (H) 04/21/2024 0404   RDW 18.5 (H) 03/12/2024 1459   LYMPHSABS 0.9 04/15/2024 1558   LYMPHSABS 1.1 03/12/2024 1459   MONOABS 0.1 04/15/2024 1558   EOSABS 0.0 04/15/2024 1558   EOSABS 0.0 03/12/2024 1459   BASOSABS 0.0 04/15/2024 1558   BASOSABS 0.0 03/12/2024 1459    BMET    Component Value Date/Time   NA 139 04/21/2024 0404   NA 141 03/12/2024 1459   K 3.8 04/21/2024 0404   CL 109 04/21/2024 0404   CO2 22 04/21/2024 0404   GLUCOSE 105 (H) 04/21/2024 0404   BUN 18 04/21/2024 0404   BUN 19 03/12/2024 1459   CREATININE 1.14 04/21/2024 0404   CREATININE 1.07 03/17/2024 1215   CALCIUM  8.1 (L) 04/21/2024 0404   GFRNONAA >60 04/21/2024 0404   GFRNONAA >60 03/17/2024 1215   GFRAA >60 08/31/2016 0443    INR    Component Value Date/Time   INR 1.3 (H) 04/15/2024 1558     Intake/Output Summary (Last 24 hours) at 04/21/2024 1607 Last data filed at  04/21/2024 1400 Gross per 24 hour  Intake 1481 ml  Output 1295 ml  Net 186 ml     Assessment/Plan:  78 y.o. male is s/p Left TCAR * Day of Surgery *   Neurologically intact Left neck incision is clean, dry and intact without swelling or hematoma Hemodynamically stable VSS  Anticipate d/c home tomorrow if he continues to progress well    Teretha Damme, PA-C [facility] [phone] 04/21/2024 4:07 PM

## 2024-04-21 NOTE — Op Note (Signed)
 Date: April 21, 2024  Preoperative diagnosis: Symptomatic left internal carotid artery stenosis 60 to 70%  Postoperative diagnosis: Same  Procedure: 1.  Ultrasound-guided access left common femoral vein for delivery of TCAR venous sheath 2.  Transcatheter placement of left cervical carotid artery stent including angioplasty with flow reversal for distal embolic protection (left TCAR)  Surgeon: Dr. Lonni DOROTHA Gaskins, MD  Assistant: Curry Damme, PA  Indications: 78 year old male seen with bilateral frontal strokes with significant carotid disease bilaterally.  His initial complaint was right leg weakness.  We were asked intervene on his left internal carotid artery stenosis first after evaluation by neurology where he has a 60 to 70% symptomatic stenosis that was felt to be more symptomatic.  He presents for left TCAR after risks benefits discussed.  An assistant was needed given the complexity of the case and also for cutdown on the carotid artery with angioplasty and stenting.  Findings: Ultrasound-guided access left common femoral vein for delivery of TCAR venous sheath.  Transverse incision above the left clavicle with cutdown on the common carotid artery.  This was accessed through a pursestring and once we were on the active flow reversal the lesion was crossed in the internal carotid artery where it was 60 to 70% stenosis and then it was treated with a 6 mm x 25 mm angioplasty balloon and then we performed a stent with a 9 mm x 40 mm Enroute stent from the internal carotid artery on the left into the common carotid artery with coverage of the plaque.  Widely patent stent at completion.  Anesthesia: General  Details: Patient was taken to the operating room after informed consent was obtained.  Placed on the operative table in supine position.  General endotracheal anesthesia was induced.  I did mark the common carotid artery above the left clavicle.  The left neck and bilateral groins  were then prepped and draped in standard sterile fashion.  Antibiotics were given and timeout performed.  Initially started in the left groin with ultrasound and evaluated the common femoral vein, it was patent, an was images saved.  This was accessed with micro access needle, placed a micro, and a micro sheath.  I then used a Bentson wire and exchanged for the TCAR venous sheath in the left common femoral vein and flushed and aspirated easily.  Then turned our attention to the left neck where a transverse incision was made 1 fingerbreadth above the clavicle.  Cut down and divided between the platysma with Bovie cautery.  The heads of the sternocleidomastoid were then divided between Bovie cautery and blunt dissection.  I then got the internal jugular vein and mobilized the left internal jugular vein laterally in the wound bed and preserved the vagus nerve.  The common carotid was controlled with a large vessel loop and a umbilical tape.  The patient was given 100 units/kg IV heparin .  5-0 Prolene was used to place pursestring on the anterior wall of the left common carotid artery and this was accessed with a micro access needle placed a micro wire and a micro sheath.  We then got a left carotid angiogram to identify the carotid bifurcation.  I then used the stiff wire to exchanged for the arterial sheath in the left common carotid artery.  We then connected the filter to the sheath in the groin passive flow reversal.  The lesion in the internal carotid artery was then identified with angiography.  We then went on active clamp and confirmed we  were on active flow reversal once we had a therapeutic ACT and checked an ACT and gave additional heparin  to get this greater than 250.  The lesion was crossed with our wire into the internal carotid artery and then I performed balloon angioplasty with a 6 mm x 25 mm angioplasty balloon and then this was stented with a 9 mm x 40 mm Enroute stent with the help of my assistant.   We allowed 2 minutes of active flow reversal.  Final imaging showed widely patent stent.  Wire was removed and we came off clamp.  We then remove the arterial sheath from the common carotid artery and tied down the pursestring with good hemostasis.  [place] with pencil Doppler had good flow.  The sheath in the groin was removed after he gave protamine  and we held manual pressure for 5 minutes.  The neck incision was closed with Surgicel snow as well as 3-0 Vicryl 4-0 Monocryl and Dermabond.  Awakened from anesthesia at neurologic baseline and taken to recovery in stable condition.  Complication: None  Condition:  Stable  Lonni DOROTHA Gaskins, MD [facility] [place] Office: [phone]   Lonni JINNY Gaskins

## 2024-04-21 NOTE — Transfer of Care (Signed)
 Immediate Anesthesia Transfer of Care Note  Patient: Christopher Roberts  Procedure(s) Performed: LEFT TRANSCAROTID ARTERY REVASCULARIZATION (TCAR) (Left: Neck)  Patient Location: PACU  Anesthesia Type:General  Level of Consciousness: awake, alert , and oriented  Airway & Oxygen Therapy: Patient Spontanous Breathing  Post-op Assessment: Report given to RN, Post -op Vital signs reviewed and stable, and Patient moving all extremities  Post vital signs: Reviewed and stable  Last Vitals:  Vitals Value Taken Time  BP 128/71 04/21/24 12:17  Temp    Pulse 88 04/21/24 12:24  Resp 23 04/21/24 12:24  SpO2 99 % 04/21/24 12:24  Vitals shown include unfiled device data.  Last Pain:  Vitals:   04/21/24 1008  TempSrc: Oral  PainSc:          Complications: No notable events documented.

## 2024-04-21 NOTE — Progress Notes (Addendum)
 PROGRESS NOTE    Christopher Roberts  FMW:[id number] DOB: 02/27/1946 DOA: 04/15/2024 PCP: [name], [name], MD  No chief complaint on file.   Brief Narrative:    78 yrs old Male with PMH significant for CAD status post PCI in 2022, diabetes mellitus type 2, pancytopenia following up with Dr. Autumn oncologist and being worked up for possible MDS, hypertension who had [name] fall today morning when he tried to get up from the bed around 10 AM.  Patient lost balance and fell onto the floor and was on the floor for almost an hour when his family member found him.  Later noticed to have right lower extremity weakness and was brought in the ED.  EMS noted that his right leg was flaccid.   MRI brain shows features concerning for acute punctate stroke. Patient's right lower extremity slowly improving and is able to move at this time and his strength is around 3/ 5.  Neurologist was consulted and recommended stroke workup for acute stroke.  Patient was admitted for further evaluation.  Assessment & Plan:   Principal Problem:   Acute CVA (cerebrovascular accident) [facility]) Active Problems:   Hyperlipidemia   Essential hypertension   Coronary artery disease due to lipid rich plaque   Diabetes mellitus (HCC)   Cirrhosis of liver (HCC)   Carotid stenosis, left  Acute CVA: R punctate MCA/ACA infarcts Patient presented with RLE weakness, MRI shows acute punctate stroke in high R frontal and L parasagittal frontal lobes.   Patient was evaluated by Neurology, L small ACA and R punctate MCA/ACA infarcts, likely due to large vessel disease from bilateral ICA stenosis - recommending DAPT, further regimen per VVS.  Lipitor  80 mg. Dysphagia III diet. CTA Head/ Neck showed right ICA stenosis > 90, left ICA stenosis  60 to 70% 2D echo shows LVEF 60 to 65%, LA moderately dilated. LDL 78 Near goal.  Hb A1c 5.9.  Drug screen negative. PT and OT evaluation.  Aggressive risk factor modification. Vascular surgery consulted  for bilateral carotid artery stenosis.    Bilateral carotid artery stenosis: Vascular surgery is consulted.  Carotid duplex > R ICA 60-79% stenosis, L ICA 40-59% stenosis  Vascular surgery consulted  Now s/p Left TCAR on 04/21/2024.   Essential hypertension , now Hypotension: Given acute stroke , and low BP  hold antihypertensives - currently on midodrine  Goal SBP 140-160 before carotid revascularization   History of CAD status post PCI: S/p LHC 07/2020 with unsucessful PCI of SVG to PDA - at that time DAPT recommended Continue antiplatelet and statins, hold beta-blockers.   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   S/p 1 unit pRBC and 1 unit platelets today Goal platelets over 50,000 in case of active bleeding or interventional procedures.    History of SVT : He is being followed by cardiologist. HR controlled.   Diabetes mellitus type 2:  A1c 5.6 Ssi for now   Cirrhosis of liver : Imaging suggestive of cirrhosis Bili elevated, INR mildly elevated, platelets low, albumin  mildly low Needs further outpatient workup    Depression  Continue fluoxetine .    DVT prophylaxis: SCD Code Status: full Family Communication: wife at bedside 12/15 Disposition:   Status is: Inpatient Remains inpatient appropriate because: need for continued inpatient care   Consultants:  Neurology vascular  Procedures:  Carotid US   Echo IMPRESSIONS     1. Left ventricular ejection fraction, by estimation, is 60 to 65%. The  left ventricle has normal function. The left ventricle  has no regional  wall motion abnormalities. There is mild asymmetric left ventricular  hypertrophy of the basal-septal segment.  Left ventricular diastolic parameters are consistent with Grade I  diastolic dysfunction (impaired relaxation).   2. Right ventricular systolic function is normal. The right ventricular  size is mildly enlarged. There is normal pulmonary artery systolic  pressure. The estimated right ventricular systolic pressure is 16.1 mmHg.   3. Left atrial size was moderately dilated.   4. The mitral valve is degenerative. Mild mitral valve regurgitation. No  evidence of mitral stenosis. Moderate mitral annular calcification.   5. The aortic valve is tricuspid. There is mild calcification of the  aortic valve. There is mild thickening of the aortic valve. Aortic valve  regurgitation is trivial. Aortic valve sclerosis is present, with no  evidence of aortic valve stenosis.   6. The inferior vena cava is normal in size with greater than 50%  respiratory variability, suggesting right atrial pressure of 3 mmHg.   Conclusion(s)/Recommendation(s): No intracardiac source of embolism  detected on this transthoracic study. Consider Campbell Kray transesophageal  echocardiogram to exclude cardiac source of embolism if clinically  indicated.   Antimicrobials:  Anti-infectives (From admission, onward)    Start     Dose/Rate Route Frequency Ordered Stop   04/21/24 0000  ceFAZolin  (ANCEF ) IVPB 1 g/50 mL premix       Note to Pharmacy: Send with pt to OR   1 g 100 mL/hr over 30 Minutes Intravenous On call 04/20/24 1019 04/21/24 1050       Subjective: Neck sore, otherwise doing ok wife at bedside  Objective: Vitals:   04/21/24 1345 04/21/24 1400 04/21/24 1423 04/21/24 1600  BP: 119/64 124/64 126/72 109/66  Pulse: 70 72 73 72  Resp: 18 15 16 15   Temp:  97.9 F (36.6 C) 98 F (36.7 C)   TempSrc:   Oral   SpO2: 98% 99% 98% 100%  Weight:      Height:        Intake/Output Summary  (Last 24 hours) at 04/21/2024 1641 Last data filed at 04/21/2024 1400 Gross per 24 hour  Intake 1481 ml  Output 1295 ml  Net 186 ml   Filed Weights   04/16/24 1847  Weight: 77.6 kg    Examination:  General: No acute distress. Cardiovascular: RRR Lungs: unlabored Neurological: Alert and oriented 3. Moves all extremities 4 . Cranial nerves II through XII grossly intact. Extremities: No clubbing or cyanosis. No edema.   Data Reviewed: I have personally reviewed following labs and imaging studies  CBC: Recent Labs  Lab 04/15/24 1558 04/16/24 0449 04/17/24 0259 04/18/24 0225 04/19/24 0841 04/20/24 0638 04/21/24 0404  WBC 2.1*   < > 1.7* 2.1* 2.0* 1.9* 2.4*  NEUTROABS 1.0*  --   --   --   --   --   --   HGB 10.3*   < > 10.6* 8.0* 8.2* 8.4* 8.1*  HCT 32.1*   < > 31.4* 23.3* 23.0* 23.8* 23.4*  MCV 95.0   < > 91.3 88.3 87.5 86.9 87.3  PLT 52*   < > 49* 42* 47* 49* 49*   < > = values in this interval not displayed.    Basic Metabolic Panel: Recent Labs  Lab 04/16/24 0449 04/17/24 0259 04/18/24 0225 04/20/24 0638 04/21/24 0404  NA 138 137 134* 138 139  K 4.4 3.9 3.4* 3.6 3.8  CL 111 107 107 111 109  CO2 21* 23 21* 18* 22  GLUCOSE 137* 148* 122* 111* 105*  BUN 14 15 18 16 18   CREATININE 1.27* 1.16 1.22 1.13 1.14  CALCIUM  8.1* 8.1* 7.4* 8.3* 8.1*  MG  --  1.6* 1.9 1.6*  --   PHOS  --  3.2 3.1 3.2  --     GFR: Estimated Creatinine Clearance: 54.3 mL/min (by C-G formula based on SCr of 1.14 mg/dL).  Liver Function Tests: Recent Labs  Lab 04/15/24 1558 04/16/24 0449  AST 23 35  ALT 11 16  ALKPHOS 104 98  BILITOT 1.9* 2.4*  PROT 7.3 6.4*  ALBUMIN  3.2* 2.9*    CBG: Recent Labs  Lab 04/20/24 1534 04/20/24 2113 04/21/24 0615 04/21/24 0900 04/21/24 1218  GLUCAP 156* 110* 117* 128* 135*     Recent Results (from the past 240 hours)  Surgical pcr screen     Status: None   Collection Time: 04/20/24  1:54 PM   Specimen: Nasal Mucosa; Nasal Swab   Result Value Ref Range Status   MRSA, PCR NEGATIVE NEGATIVE Final   Staphylococcus aureus NEGATIVE NEGATIVE Final    Comment: (NOTE) The Xpert SA Assay (FDA approved for NASAL specimens in patients 61 years of age and older), is one component of Amand Lemoine comprehensive surveillance program. It is not intended to diagnose infection nor to guide or monitor treatment. Performed at [facility], [address], [place], [place] [address]          Radiology Studies: DG C-Arm 1-60 Min Result Date: 04/21/2024 EXAM: FLUOROSCOPIC IMAGING TECHNIQUE: Fluoroscopy was provided by the radiology department for procedure. Radiologist was not present during examination. RADIATION DOSE INDEX: Reference Air Kerma: 38.5 mGy COMPARISON: None available. CLINICAL HISTORY: LEFT TRANSCAROTID ARTERY REVASCULARIZATION (TCAR) FINDINGS: Intraoperative fluoroscopic imaging was performed. IMPRESSION: 1. Intraoperative fluoroscopic imaging as above. Please refer to the operative report for full details. Electronically signed by: Dayne Hassell MD 04/21/2024 03:53 PM EST RP Workstation: [id number]        Scheduled Meds:  aspirin   81 mg Oral Daily   atorvastatin   80 mg Oral q1800   Chlorhexidine  Gluconate Cloth  6 each Topical Daily   clopidogrel   75 mg Oral Daily   feeding supplement  237 mL Oral BID BM   FLUoxetine   20 mg Oral Daily   insulin  aspart  0-5 Units Subcutaneous QHS   insulin  aspart  0-9 Units Subcutaneous TID WC   midodrine   10 mg Oral TID WC   tamsulosin   0.4 mg Oral QPC supper   Continuous Infusions:     LOS: 5 days    Time spent: over 30 min     Meliton Monte, MD [facility]   To contact the attending provider between 7A-7P or the covering provider during after hours 7P-7A, please log into the web site [email or web address] and access using universal [place] password for that web site. If you do not have the password, please call the hospital operator.  04/21/2024, 4:41 PM

## 2024-04-21 NOTE — Anesthesia Procedure Notes (Addendum)
 Arterial Line Insertion Start/End12/15/2025 9:00 AM Performed by: Lamar Lucie DASEN, CRNA  Patient location: Pre-op. Preanesthetic checklist: patient identified, IV checked, site marked, risks and benefits discussed, surgical consent, monitors and equipment checked, pre-op evaluation, timeout performed and anesthesia consent Lidocaine  1% used for infiltration Left, radial was placed Catheter size: 20 G Hand hygiene performed  and maximum sterile barriers used   Attempts: 2 Following insertion, dressing applied and Biopatch. Post procedure assessment: normal and unchanged  Patient tolerated the procedure well with no immediate complications.

## 2024-04-21 NOTE — Progress Notes (Signed)
 OT Cancellation Note  Patient Details Name: MIROSLAV GIN MRN: [id number] DOB: Jan 18, 1946   Cancelled Treatment:    Reason Eval/Treat Not Completed: Patient at procedure or test/ unavailable.   Elma JONETTA Lebron FREDERICK, OTR/L [facility] Office: [phone]   Elma JONETTA Lebron 04/21/2024, 7:46 AM

## 2024-04-21 NOTE — Anesthesia Preprocedure Evaluation (Signed)
 Anesthesia Evaluation  Patient identified by MRN, date of birth, ID band Patient awake    Reviewed: Allergy & Precautions, H&P , NPO status , Patient's Chart, lab work & pertinent test results, reviewed documented beta blocker date and time   Airway Mallampati: I  TM Distance: >3 FB Neck ROM: Full    Dental no notable dental hx. (+) Missing, Dental Advisory Given,    Pulmonary former smoker   Pulmonary exam normal breath sounds clear to auscultation       Cardiovascular hypertension (107/69 preop), Pt. on medications and Pt. on home beta blockers + CAD, + Past MI (2005), + Cardiac Stents (2022, denies any recent CP/SOB) and + CABG (2009)  Normal cardiovascular exam+ Valvular Problems/Murmurs (mild MR) MR  Rhythm:Regular Rate:Normal  Echo 04/16/24 1. Left ventricular ejection fraction, by estimation, is 60 to 65%. The  left ventricle has normal function. The left ventricle has no regional  wall motion abnormalities. There is mild asymmetric left ventricular  hypertrophy of the basal-septal segment.  Left ventricular diastolic parameters are consistent with Grade I  diastolic dysfunction (impaired relaxation).   2. Right ventricular systolic function is normal. The right ventricular  size is mildly enlarged. There is normal pulmonary artery systolic  pressure. The estimated right ventricular systolic pressure is 16.1 mmHg.   3. Left atrial size was moderately dilated.   4. The mitral valve is degenerative. Mild mitral valve regurgitation. No  evidence of mitral stenosis. Moderate mitral annular calcification.   5. The aortic valve is tricuspid. There is mild calcification of the  aortic valve. There is mild thickening of the aortic valve. Aortic valve  regurgitation is trivial. Aortic valve sclerosis is present, with no  evidence of aortic valve stenosis.   6. The inferior vena cava is normal in size with greater than 50%   respiratory variability, suggesting right atrial pressure of 3 mmHg.   Cath 2022 1. 3 vessel occlusive CAD.     - 95% mid LAD. 90% diffuse small first diagonal    - 100% mid LCx    - 100% proximal RCA. 2. Patent LIMA to the LAD 3. Subtotal Occlusion of the SVG to PDA with extensive thrombosis. 4. Good LV function 5. Normal LVEDP 6. Unsuccessful PCI of the SVG to PDA with distal embolization of thrombus and total occlusion of the SVG     Neuro/Psych  PSYCHIATRIC DISORDERS Anxiety Depression    S/p fall at home, MRI w/ acute stroke CVA (R leg weakness, occasional LUE tingling), Residual Symptoms    GI/Hepatic negative GI ROS, Neg liver ROS,,,  Endo/Other  diabetes, Well Controlled, Type 2, Oral Hypoglycemic Agents    Renal/GU negative Renal ROS  negative genitourinary   Musculoskeletal  (+) Arthritis , Osteoarthritis,    Abdominal   Peds negative pediatric ROS (+)  Hematology  (+) Blood dyscrasia, anemia  pancytopenia following up with Dr. Autumn oncologist and being worked up for possible MDS Plt 49 Hb 8.1   Anesthesia Other Findings   Reproductive/Obstetrics negative OB ROS                              Anesthesia Physical Anesthesia Plan  ASA: 3  Anesthesia Plan: General   Post-op Pain Management: Tylenol  PO (pre-op)*   Induction: Intravenous  PONV Risk Score and Plan: 2 and Ondansetron , Dexamethasone  and Treatment may vary due to age or medical condition  Airway Management Planned: Oral ETT  Additional Equipment: Arterial line  Intra-op Plan:   Post-operative Plan: Extubation in OR  Informed Consent: I have reviewed the patients History and Physical, chart, labs and discussed the procedure including the risks, benefits and alternatives for the proposed anesthesia with the patient or authorized representative who has indicated his/her understanding and acceptance.     Dental advisory given  Plan Discussed with:  CRNA  Anesthesia Plan Comments: (Preop plt 49, per Dr. Gretta 1 unit plt preop Starting hb 8.1, will send for 2 units and keep in OR for likely transfusion)         Anesthesia Quick Evaluation

## 2024-04-21 NOTE — Anesthesia Procedure Notes (Signed)
 Procedure Name: Intubation Date/Time: 04/21/2024 10:39 AM  Performed by: Jerl Donald LABOR, CRNAPre-anesthesia Checklist: Patient identified, Emergency Drugs available, Suction available and Patient being monitored Patient Re-evaluated:Patient Re-evaluated prior to induction Oxygen Delivery Method: Circle System Utilized Preoxygenation: Pre-oxygenation with 100% oxygen Induction Type: IV induction Ventilation: Mask ventilation without difficulty Laryngoscope Size: Mac and 3 Grade View: Grade I Tube type: Oral Tube size: 7.5 mm Number of attempts: 1 Airway Equipment and Method: Oral airway Placement Confirmation: ETT inserted through vocal cords under direct vision, positive ETCO2 and breath sounds checked- equal and bilateral Secured at: 21 cm Tube secured with: Tape Dental Injury: Teeth and Oropharynx as per pre-operative assessment

## 2024-04-21 NOTE — Plan of Care (Signed)
°  Problem: Education: Goal: Ability to describe self-care measures that may prevent or decrease complications (Diabetes Survival Skills Education) will improve Outcome: Progressing   Problem: Coping: Goal: Ability to adjust to condition or change in health will improve Outcome: Progressing   Problem: Fluid Volume: Goal: Ability to maintain a balanced intake and output will improve Outcome: Progressing   Problem: Skin Integrity: Goal: Risk for impaired skin integrity will decrease Outcome: Progressing   Problem: Ischemic Stroke/TIA Tissue Perfusion: Goal: Complications of ischemic stroke/TIA will be minimized Outcome: Progressing   Problem: Coping: Goal: Will verbalize positive feelings about self Outcome: Progressing   Problem: Self-Care: Goal: Ability to participate in self-care as condition permits will improve Outcome: Progressing

## 2024-04-22 ENCOUNTER — Inpatient Hospital Stay: Admitting: Oncology

## 2024-04-22 ENCOUNTER — Encounter (HOSPITAL_COMMUNITY): Payer: Self-pay | Admitting: Vascular Surgery

## 2024-04-22 ENCOUNTER — Other Ambulatory Visit (HOSPITAL_COMMUNITY): Payer: Self-pay

## 2024-04-22 ENCOUNTER — Inpatient Hospital Stay

## 2024-04-22 LAB — COMPREHENSIVE METABOLIC PANEL WITH GFR
ALT: 21 U/L (ref 0–44)
AST: 35 U/L (ref 15–41)
Albumin: 2.6 g/dL — ABNORMAL LOW (ref 3.5–5.0)
Alkaline Phosphatase: 92 U/L (ref 38–126)
Anion gap: 12 (ref 5–15)
BUN: 21 mg/dL (ref 8–23)
CO2: 21 mmol/L — ABNORMAL LOW (ref 22–32)
Calcium: 8.4 mg/dL — ABNORMAL LOW (ref 8.9–10.3)
Chloride: 108 mmol/L (ref 98–111)
Creatinine, Ser: 1.26 mg/dL — ABNORMAL HIGH (ref 0.61–1.24)
GFR, Estimated: 58 mL/min — ABNORMAL LOW (ref >60)
Glucose, Bld: 204 mg/dL — ABNORMAL HIGH (ref 70–99)
Potassium: 4.1 mmol/L (ref 3.5–5.1)
Sodium: 141 mmol/L (ref 135–145)
Total Bilirubin: 1.3 mg/dL — ABNORMAL HIGH (ref 0.0–1.2)
Total Protein: 6 g/dL — ABNORMAL LOW (ref 6.5–8.1)

## 2024-04-22 LAB — GLUCOSE, CAPILLARY
Glucose-Capillary: 178 mg/dL — ABNORMAL HIGH (ref 70–99)
Glucose-Capillary: 147 mg/dL — ABNORMAL HIGH (ref 70–99)
Glucose-Capillary: 229 mg/dL — ABNORMAL HIGH (ref 70–99)
Glucose-Capillary: 152 mg/dL — ABNORMAL HIGH (ref 70–99)
Glucose-Capillary: 149 mg/dL — ABNORMAL HIGH (ref 70–99)

## 2024-04-22 LAB — CBC WITH DIFFERENTIAL/PLATELET
Abs Immature Granulocytes: 0.2 K/uL — ABNORMAL HIGH (ref 0.00–0.07)
Basophils Absolute: 0 K/uL (ref 0.0–0.1)
Basophils Relative: 1 %
Eosinophils Absolute: 0 K/uL (ref 0.0–0.5)
Eosinophils Relative: 1 %
HCT: 23.9 % — ABNORMAL LOW (ref 39.0–52.0)
Hemoglobin: 8.4 g/dL — ABNORMAL LOW (ref 13.0–17.0)
Immature Granulocytes: 10 %
Lymphocytes Relative: 34 %
Lymphs Abs: 0.7 K/uL (ref 0.7–4.0)
MCH: 30.9 pg (ref 26.0–34.0)
MCHC: 35.1 g/dL (ref 30.0–36.0)
MCV: 87.9 fL (ref 80.0–100.0)
Monocytes Absolute: 0.1 K/uL (ref 0.1–1.0)
Monocytes Relative: 6 %
Neutro Abs: 1 K/uL — ABNORMAL LOW (ref 1.7–7.7)
Neutrophils Relative %: 48 %
Platelets: 50 K/uL — ABNORMAL LOW (ref 150–400)
RBC: 2.72 MIL/uL — ABNORMAL LOW (ref 4.22–5.81)
RDW: 18.8 % — ABNORMAL HIGH (ref 11.5–15.5)
Smear Review: NORMAL
WBC: 2 K/uL — ABNORMAL LOW (ref 4.0–10.5)
nRBC: 3.9 % — ABNORMAL HIGH (ref 0.0–0.2)

## 2024-04-22 LAB — MAGNESIUM: Magnesium: 1.9 mg/dL (ref 1.7–2.4)

## 2024-04-22 LAB — LIPID PANEL
Cholesterol: 98 mg/dL (ref 0–200)
HDL: 25 mg/dL — ABNORMAL LOW (ref >40)
LDL Cholesterol: 64 mg/dL (ref 0–99)
Total CHOL/HDL Ratio: 3.9 ratio
Triglycerides: 46 mg/dL (ref <150)
VLDL: 9 mg/dL (ref 0–40)

## 2024-04-22 LAB — PREPARE PLATELET PHERESIS: Unit division: 0

## 2024-04-22 LAB — BPAM PLATELET PHERESIS
Blood Product Expiration Date: 202512162359
ISSUE DATE / TIME: 202512150809
Unit Type and Rh: 6200

## 2024-04-22 LAB — PHOSPHORUS: Phosphorus: 2.7 mg/dL (ref 2.5–4.6)

## 2024-04-22 NOTE — Progress Notes (Addendum)
 PROGRESS NOTE    Christopher Roberts  FMW:[id number] DOB: 1945/06/07 DOA: 04/15/2024 PCP: [name], [name], MD  No chief complaint on file.   Brief Narrative:    78 yrs old Male with PMH significant for CAD status post PCI in 2022, diabetes mellitus type 2, pancytopenia following up with Dr. Autumn oncologist and being worked up for possible MDS, hypertension who had Hakop Humbarger fall today morning when he tried to get up from the bed around 10 AM.  Patient lost balance and fell onto the floor and was on the floor for almost an hour when his family member found him.  Later noticed to have right lower extremity weakness and was brought in the ED.  EMS noted that his right leg was flaccid.   MRI brain shows features concerning for acute punctate stroke. Patient's right lower extremity slowly improving and is able to move at this time and his strength is around 3/ 5.  Neurologist was consulted and recommended stroke workup for acute stroke.  Patient was admitted for further evaluation.  Assessment & Plan:   Principal Problem:   Acute CVA (cerebrovascular accident) [facility]) Active Problems:   Hyperlipidemia   Essential hypertension   Coronary artery disease due to lipid rich plaque   Diabetes mellitus (HCC)   Cirrhosis of liver (HCC)   Carotid stenosis, left  Acute CVA: R punctate MCA/ACA infarcts Patient presented with RLE weakness, MRI shows acute punctate stroke in high R frontal and L parasagittal frontal lobes.   Patient was evaluated by Neurology, L small ACA and R punctate MCA/ACA infarcts, likely due to large vessel disease from bilateral ICA stenosis - recommending DAPT, further regimen per [facility].  Lipitor  80 mg. Dysphagia III diet. CTA Head/ Neck showed right ICA stenosis > 90, left ICA stenosis  60 to 70% 2D echo shows LVEF 60 to 65%, LA moderately dilated. LDL 78 Near goal.  Hb A1c 5.9.  Drug screen negative. PT and OT evaluation.  Aggressive risk factor modification. Vascular surgery consulted  for bilateral carotid artery stenosis.    Bilateral carotid artery stenosis: Vascular surgery is consulted.  Carotid duplex > R ICA 60-79% stenosis, L ICA 40-59% stenosis  Vascular surgery consulted  Now s/p Left TCAR on 04/21/2024.  Continue DAPT, statin.  Needs staged procedure on R.   Essential hypertension , now Hypotension: Given acute stroke , and low BP  hold antihypertensives - currently on midodrine  Goal SBP 130 - 150    History of CAD status post PCI: S/p LHC 07/2020 with unsucessful PCI of SVG to PDA - at that time DAPT recommended Continue antiplatelet and statins, hold beta-blockers.   Urinary Retention Required I/O cath yesterday with 1 L retained 12/13 TOV today  Pancytopenia :  Seen by oncology 12/10 - concern for MDS, when deemed safe, it is adviseable to proceed with bone marrow biopsy/aspiration by IR for further evaluation of pancytopenia Scheduled outpatient on 12/26 Heme onc ok with outpatient follow up for bx Transfuse prn to maintain hb >7 and platelets >20,000.   S/p 1 unit pRBC and 1 unit platelets  Goal platelets over 50,000 in case of active bleeding or interventional procedures.    History of SVT : He is being followed by cardiologist. HR controlled.   Diabetes mellitus type 2:  A1c 5.6 Ssi for now   Cirrhosis of liver : Imaging suggestive of cirrhosis Bili elevated, INR mildly elevated, platelets low, albumin  mildly low Needs further outpatient workup    Depression  Continue fluoxetine .    DVT prophylaxis: SCD Code Status: full Family Communication: wife at bedside 12/15 Disposition:   Status is: Inpatient Remains inpatient appropriate because: need for continued inpatient care   Consultants:  Neurology vascular  Procedures:  Carotid US   Echo IMPRESSIONS     1. Left ventricular ejection fraction, by estimation, is 60 to 65%. The  left ventricle has normal function. The left ventricle has no regional  wall motion  abnormalities. There is mild asymmetric left ventricular  hypertrophy of the basal-septal segment.  Left ventricular diastolic parameters are consistent with Grade I  diastolic dysfunction (impaired relaxation).   2. Right ventricular systolic function is normal. The right ventricular  size is mildly enlarged. There is normal pulmonary artery systolic  pressure. The estimated right ventricular systolic pressure is 16.1 mmHg.   3. Left atrial size was moderately dilated.   4. The mitral valve is degenerative. Mild mitral valve regurgitation. No  evidence of mitral stenosis. Moderate mitral annular calcification.   5. The aortic valve is tricuspid. There is mild calcification of the  aortic valve. There is mild thickening of the aortic valve. Aortic valve  regurgitation is trivial. Aortic valve sclerosis is present, with no  evidence of aortic valve stenosis.   6. The inferior vena cava is normal in size with greater than 50%  respiratory variability, suggesting right atrial pressure of 3 mmHg.   Conclusion(s)/Recommendation(s): No intracardiac source of embolism  detected on this transthoracic study. Consider Christopher Roberts transesophageal  echocardiogram to exclude cardiac source of embolism if clinically  indicated.   Antimicrobials:  Anti-infectives (From admission, onward)    Start     Dose/Rate Route Frequency Ordered Stop   04/21/24 1800  ceFAZolin  (ANCEF ) IVPB 2g/100 mL premix        2 g 200 mL/hr over 30 Minutes Intravenous Every 8 hours 04/21/24 1704 04/22/24 0308   04/21/24 0000  ceFAZolin  (ANCEF ) IVPB 1 g/50 mL premix       Note to Pharmacy: Send with pt to OR   1 g 100 mL/hr over 30 Minutes Intravenous On call 04/20/24 1019 04/21/24 1050       Subjective: No new complaints  Objective: Vitals:   04/22/24 0200 04/22/24 0435 04/22/24 0747 04/22/24 1153  BP:  (!) 101/58 121/68 (!) 105/53  Pulse: 63 64 64 76  Resp: 15 15 14 12   Temp:  97.8 F (36.6 C) 97.6 F (36.4 C) 98.1 F  (36.7 C)  TempSrc:  Oral Oral Oral  SpO2: 98% 98% 99% 100%  Weight:      Height:        Intake/Output Summary (Last 24 hours) at 04/22/2024 1421 Last data filed at 04/21/2024 2000 Gross per 24 hour  Intake 100 ml  Output --  Net 100 ml   Filed Weights   04/16/24 1847  Weight: 77.6 kg    Examination:  General: No acute distress. Cardiovascular: RRR Lungs: unlabored Neurological: Alert and oriented 3. Moves all extremities 4 . Extremities: No clubbing or cyanosis. No edema.   Data Reviewed: I have personally reviewed following labs and imaging studies  CBC: Recent Labs  Lab 04/15/24 1558 04/16/24 0449 04/18/24 0225 04/19/24 0841 04/20/24 0638 04/21/24 0404 04/22/24 0308  WBC 2.1*   < > 2.1* 2.0* 1.9* 2.4* 2.0*  NEUTROABS 1.0*  --   --   --   --   --  1.0*  HGB 10.3*   < > 8.0* 8.2* 8.4* 8.1* 8.4*  HCT  32.1*   < > 23.3* 23.0* 23.8* 23.4* 23.9*  MCV 95.0   < > 88.3 87.5 86.9 87.3 87.9  PLT 52*   < > 42* 47* 49* 49* 50*   < > = values in this interval not displayed.    Basic Metabolic Panel: Recent Labs  Lab 04/17/24 0259 04/18/24 0225 04/20/24 0638 04/21/24 0404 04/22/24 0308  NA 137 134* 138 139 141  K 3.9 3.4* 3.6 3.8 4.1  CL 107 107 111 109 108  CO2 23 21* 18* 22 21*  GLUCOSE 148* 122* 111* 105* 204*  BUN 15 18 16 18 21   CREATININE 1.16 1.22 1.13 1.14 1.26*  CALCIUM  8.1* 7.4* 8.3* 8.1* 8.4*  MG 1.6* 1.9 1.6*  --  1.9  PHOS 3.2 3.1 3.2  --  2.7    GFR: Estimated Creatinine Clearance: 49.1 mL/min (Christopher Roberts) (by C-G formula based on SCr of 1.26 mg/dL (H)).  Liver Function Tests: Recent Labs  Lab 04/15/24 1558 04/16/24 0449 04/22/24 0308  AST 23 35 35  ALT 11 16 21   ALKPHOS 104 98 92  BILITOT 1.9* 2.4* 1.3*  PROT 7.3 6.4* 6.0*  ALBUMIN  3.2* 2.9* 2.6*    CBG: Recent Labs  Lab 04/21/24 1857 04/21/24 2119 04/22/24 0605 04/22/24 0752 04/22/24 1155  GLUCAP 200* 274* 178* 147* 229*     Recent Results (from the past 240 hours)   Surgical pcr screen     Status: None   Collection Time: 04/20/24  1:54 PM   Specimen: Nasal Mucosa; Nasal Swab  Result Value Ref Range Status   MRSA, PCR NEGATIVE NEGATIVE Final   Staphylococcus aureus NEGATIVE NEGATIVE Final    Comment: (NOTE) The Xpert SA Assay (FDA approved for NASAL specimens in patients 57 years of age and older), is one component of Christopher Roberts comprehensive surveillance program. It is not intended to diagnose infection nor to guide or monitor treatment. Performed at [facility], [address] [place], [place] [address]          Radiology Studies: DG C-Arm 1-60 Min Result Date: 04/21/2024 EXAM: FLUOROSCOPIC IMAGING TECHNIQUE: Fluoroscopy was provided by the radiology department for procedure. Radiologist was not present during examination. RADIATION DOSE INDEX: Reference Air Kerma: 38.5 mGy COMPARISON: None available. CLINICAL HISTORY: LEFT TRANSCAROTID ARTERY REVASCULARIZATION (TCAR) FINDINGS: Intraoperative fluoroscopic imaging was performed. IMPRESSION: 1. Intraoperative fluoroscopic imaging as above. Please refer to the operative report for full details. Electronically signed by: Christopher Hassell MD 04/21/2024 03:53 PM EST RP Workstation: [id number]        Scheduled Meds:  aspirin   81 mg Oral Daily   atorvastatin   80 mg Oral q1800   Chlorhexidine  Gluconate Cloth  6 each Topical Daily   clopidogrel   75 mg Oral Daily   docusate sodium   100 mg Oral Daily   feeding supplement  237 mL Oral BID BM   FLUoxetine   20 mg Oral Daily   insulin  aspart  0-5 Units Subcutaneous QHS   insulin  aspart  0-9 Units Subcutaneous TID WC   midodrine   10 mg Oral TID WC   sodium chloride  flush  3 mL Intravenous Q12H   tamsulosin   0.4 mg Oral QPC supper   Continuous Infusions:  sodium chloride      sodium chloride         LOS: 6 days    Time spent: over 30 min     Christopher Monte, MD [facility]   To contact the attending provider between 7A-7P or the  covering  provider during after hours 7P-7A, please log into the web site [email or web address] and access using universal [place] password for that web site. If you do not have the password, please call the hospital operator.  04/22/2024, 2:21 PM

## 2024-04-22 NOTE — Progress Notes (Signed)
 A-line removed per order with catheter intact and pressure was held for 5 min.  Minutes after removal patient developed a hematoma the size of a baseball. Pressure was held immediately. Adina Sender, PA was notified and came to bedside. Pressure was held for 10 minutes while awaiting for Provider's arrival.  Pressure was held for an additional 10 minutes post provider arrival per order. Monitoring closely per provider order.  Adelita JULIANNA Parkin, RN

## 2024-04-22 NOTE — Progress Notes (Signed)
° °  Inpatient Rehabilitation Admissions Coordinator   I will place rehab consult for full assessment of candidacy for possible Cir admit.  Heron Leavell, RN, MSN Rehab Admissions Coordinator [phone] 04/22/2024 2:46 PM

## 2024-04-22 NOTE — Discharge Instructions (Signed)

## 2024-04-22 NOTE — Progress Notes (Addendum)
 STROKE TEAM PROGRESS NOTE   SUBJECTIVE (INTERVAL HISTORY)  Pt was lying comfortably in bed this morning. He continues to have good tongue movement and good strength in his legs. Has no new concerns today. Neurologic exam is unchanged. Vital signs are stable. He has developed a small hematoma in the left radial artery catheter site SUBJECTIVE Temp:  [97.6 F (36.4 C)-98.1 F (36.7 C)] 98.1 F (36.7 C) (12/16 1153) Pulse Rate:  [63-88] 76 (12/16 1153) Cardiac Rhythm: Normal sinus rhythm (12/16 0738) Resp:  [4-23] 12 (12/16 1153) BP: (100-132)/(53-75) 105/53 (12/16 1153) SpO2:  [96 %-100 %] 100 % (12/16 1153) Arterial Line BP: (117-162)/(46-67) 124/46 (12/16 0200)  Recent Labs  Lab 04/21/24 1218 04/21/24 1857 04/21/24 2119 04/22/24 0605 04/22/24 0752  GLUCAP 135* 200* 274* 178* 147*   Recent Labs  Lab 04/17/24 0259 04/18/24 0225 04/20/24 0638 04/21/24 0404 04/22/24 0308  NA 137 134* 138 139 141  K 3.9 3.4* 3.6 3.8 4.1  CL 107 107 111 109 108  CO2 23 21* 18* 22 21*  GLUCOSE 148* 122* 111* 105* 204*  BUN 15 18 16 18 21   CREATININE 1.16 1.22 1.13 1.14 1.26*  CALCIUM  8.1* 7.4* 8.3* 8.1* 8.4*  MG 1.6* 1.9 1.6*  --  1.9  PHOS 3.2 3.1 3.2  --  2.7   Recent Labs  Lab 04/15/24 1558 04/16/24 0449 04/22/24 0308  AST 23 35 35  ALT 11 16 21   ALKPHOS 104 98 92  BILITOT 1.9* 2.4* 1.3*  PROT 7.3 6.4* 6.0*  ALBUMIN  3.2* 2.9* 2.6*   Recent Labs  Lab 04/15/24 1558 04/16/24 0449 04/18/24 0225 04/19/24 0841 04/20/24 0638 04/21/24 0404 04/22/24 0308  WBC 2.1*   < > 2.1* 2.0* 1.9* 2.4* 2.0*  NEUTROABS 1.0*  --   --   --   --   --  1.0*  HGB 10.3*   < > 8.0* 8.2* 8.4* 8.1* 8.4*  HCT 32.1*   < > 23.3* 23.0* 23.8* 23.4* 23.9*  MCV 95.0   < > 88.3 87.5 86.9 87.3 87.9  PLT 52*   < > 42* 47* 49* 49* 50*   < > = values in this interval not displayed.   Recent Labs  Lab 04/15/24 1558  CKTOTAL 131   No results for input(s): LABPROT, INR in the last 72 hours.  No  results for input(s): COLORURINE, LABSPEC, PHURINE, GLUCOSEU, HGBUR, BILIRUBINUR, KETONESUR, PROTEINUR, UROBILINOGEN, NITRITE, LEUKOCYTESUR in the last 72 hours.  Invalid input(s): APPERANCEUR     Component Value Date/Time   CHOL 98 04/22/2024 0308   TRIG 46 04/22/2024 0308   HDL 25 (L) 04/22/2024 0308   CHOLHDL 3.9 04/22/2024 0308   VLDL 9 04/22/2024 0308   LDLCALC 64 04/22/2024 0308   Lab Results  Component Value Date   HGBA1C 5.6 04/16/2024      Component Value Date/Time   LABOPIA NONE DETECTED 04/15/2024 1611   COCAINSCRNUR NONE DETECTED 04/15/2024 1611   LABBENZ NONE DETECTED 04/15/2024 1611   AMPHETMU NONE DETECTED 04/15/2024 1611   THCU NONE DETECTED 04/15/2024 1611   LABBARB NONE DETECTED 04/15/2024 1611    Recent Labs  Lab 04/15/24 1558  ETH <15    I have personally reviewed the radiological images below and agree with the radiology interpretations.  DG C-Arm 1-60 Min Result Date: 04/21/2024 EXAM: FLUOROSCOPIC IMAGING TECHNIQUE: Fluoroscopy was provided by the radiology department for procedure. Radiologist was not present during examination. RADIATION DOSE INDEX: Reference Air Kerma: 38.5 mGy COMPARISON:  None available. CLINICAL HISTORY: LEFT TRANSCAROTID ARTERY REVASCULARIZATION (TCAR) FINDINGS: Intraoperative fluoroscopic imaging was performed. IMPRESSION: 1. Intraoperative fluoroscopic imaging as above. Please refer to the operative report for full details. Electronically signed by: Katheleen Faes MD 04/21/2024 03:53 PM EST RP Workstation: [id number]   VAS US  CAROTID Result Date: 04/19/2024 Carotid Arterial Duplex Study Patient Name:  Christopher Roberts  Date of Exam:   04/17/2024 Medical Rec #: [id number]       Accession #:    [id number] Date of Birth: Jul 18, 1945       Patient Gender: M Patient Age:   78 years Exam Location:  [facility] Procedure:      VAS US  CAROTID Referring Phys: LUCIE RHYNE  --------------------------------------------------------------------------------  Indications:       CVA and Carotid artery disease. Risk Factors:      Hypertension, hyperlipidemia, Diabetes, past history of                    smoking, prior MI, coronary artery disease. Comparison Study:  No previous carotid duplex exams. CTA on 04/26/2024 showed RT                    ICA >90% & LT ICA 60-70% Performing Technologist: Jody Hill RVT, RDMS  Examination Guidelines: A complete evaluation includes B-mode imaging, spectral Doppler, color Doppler, and power Doppler as needed of all accessible portions of each vessel. Bilateral testing is considered an integral part of a complete examination. Limited examinations for reoccurring indications may be performed as noted.  Right Carotid Findings: +----------+--------+--------+--------+---------------------+------------------+           PSV cm/sEDV cm/sStenosisPlaque Description   Comments           +----------+--------+--------+--------+---------------------+------------------+ CCA Prox  85      13                                                      +----------+--------+--------+--------+---------------------+------------------+ CCA Mid                           heterogenous                            +----------+--------+--------+--------+---------------------+------------------+ CCA Distal98      17              heterogenous         intimal thickening +----------+--------+--------+--------+---------------------+------------------+ ICA Prox  314     91      60-79%  calcific, diffuse and                                                     irregular                               +----------+--------+--------+--------+---------------------+------------------+ ICA Mid   252     58                                                      +----------+--------+--------+--------+---------------------+------------------+  ICA Distal83       23                                                      +----------+--------+--------+--------+---------------------+------------------+ ECA       129     0               calcific                                +----------+--------+--------+--------+---------------------+------------------+ +----------+--------+-------+----------------+-------------------+           PSV cm/sEDV cmsDescribe        Arm Pressure (mmHG) +----------+--------+-------+----------------+-------------------+ Subclavian203            Multiphasic, WNL                    +----------+--------+-------+----------------+-------------------+ +---------+--------+--+--------+--+---------+ VertebralPSV cm/s64EDV cm/s13Antegrade +---------+--------+--+--------+--+---------+  Left Carotid Findings: +----------+--------+--------+--------+-------------------------------+--------+           PSV cm/sEDV cm/sStenosisPlaque Description             Comments +----------+--------+--------+--------+-------------------------------+--------+ CCA Prox  106     20                                                      +----------+--------+--------+--------+-------------------------------+--------+ CCA Mid                           calcific and heterogenous               +----------+--------+--------+--------+-------------------------------+--------+ CCA Distal141     31                                                      +----------+--------+--------+--------+-------------------------------+--------+ ICA Prox  191     56      40-59%  calcific, irregular and diffuse         +----------+--------+--------+--------+-------------------------------+--------+ ICA Mid   124     39                                                      +----------+--------+--------+--------+-------------------------------+--------+ ICA Distal97      29                                                       +----------+--------+--------+--------+-------------------------------+--------+ ECA       80                      calcific and heterogenous               +----------+--------+--------+--------+-------------------------------+--------+ +----------+--------+--------+----------------+-------------------+  PSV cm/sEDV cm/sDescribe        Arm Pressure (mmHG) +----------+--------+--------+----------------+-------------------+ [id number]             Multiphasic, WNL                    +----------+--------+--------+----------------+-------------------+ +---------+--------+--+--------+--+---------+ VertebralPSV cm/s51EDV cm/s17Antegrade +---------+--------+--+--------+--+---------+   Summary: Right Carotid: Velocities in the right ICA are consistent with a 60-79% stenosis                (upper end of range). Left Carotid: Velocities in the left ICA are consistent with a 40-59% stenosis               (upper end of range). Vertebrals:  Bilateral vertebral arteries demonstrate antegrade flow. Subclavians: Normal flow hemodynamics were seen in bilateral subclavian              arteries. *See table(s) above for measurements and observations.  Electronically signed by Eather Popp MD on 04/19/2024 at 11:29:45 AM.    Final    ECHOCARDIOGRAM COMPLETE Result Date: 04/16/2024    ECHOCARDIOGRAM REPORT   Patient Name:   GUSS FARRUGGIA Date of Exam: 04/16/2024 Medical Rec #:  [id number]      Height:       69.0 in Accession #:    [id number]     Weight:       172.4 lb Date of Birth:  18-Mar-1946      BSA:          1.940 m Patient Age:    78 years       BP:           138/67 mmHg Patient Gender: M              HR:           74 bpm. Exam Location:  Inpatient Procedure: 2D Echo, Cardiac Doppler and Color Doppler (Both Spectral and Color            Flow Doppler were utilized during procedure). Indications:    Stroke 163.9  History:        Patient has prior history of Echocardiogram examinations, most                  recent 07/26/2020. CAD and Previous Myocardial Infarction,                 Stroke, Signs/Symptoms:Chest Pain and Shortness of Breath; Risk                 Factors:Dyslipidemia, Hypertension, Diabetes and Former Smoker.  Sonographer:    Merlynn Argyle Referring Phys: [phone] REDIA LOISE CLEAVER  Sonographer Comments: Image acquisition challenging due to respiratory motion. IMPRESSIONS  1. Left ventricular ejection fraction, by estimation, is 60 to 65%. The left ventricle has normal function. The left ventricle has no regional wall motion abnormalities. There is mild asymmetric left ventricular hypertrophy of the basal-septal segment. Left ventricular diastolic parameters are consistent with Grade I diastolic dysfunction (impaired relaxation).  2. Right ventricular systolic function is normal. The right ventricular size is mildly enlarged. There is normal pulmonary artery systolic pressure. The estimated right ventricular systolic pressure is 16.1 mmHg.  3. Left atrial size was moderately dilated.  4. The mitral valve is degenerative. Mild mitral valve regurgitation. No evidence of mitral stenosis. Moderate mitral annular calcification.  5. The aortic valve is tricuspid. There is mild calcification of the aortic valve. There is mild thickening of the aortic valve. Aortic valve  regurgitation is trivial. Aortic valve sclerosis is present, with no evidence of aortic valve stenosis.  6. The inferior vena cava is normal in size with greater than 50% respiratory variability, suggesting right atrial pressure of 3 mmHg. Conclusion(s)/Recommendation(s): No intracardiac source of embolism detected on this transthoracic study. Consider a transesophageal echocardiogram to exclude cardiac source of embolism if clinically indicated. FINDINGS  Left Ventricle: Left ventricular ejection fraction, by estimation, is 60 to 65%. The left ventricle has normal function. The left ventricle has no regional wall motion abnormalities. The  left ventricular internal cavity size was normal in size. There is  mild asymmetric left ventricular hypertrophy of the basal-septal segment. Left ventricular diastolic parameters are consistent with Grade I diastolic dysfunction (impaired relaxation). Right Ventricle: The right ventricular size is mildly enlarged. No increase in right ventricular wall thickness. Right ventricular systolic function is normal. There is normal pulmonary artery systolic pressure. The tricuspid regurgitant velocity is 1.81  m/s, and with an assumed right atrial pressure of 3 mmHg, the estimated right ventricular systolic pressure is 16.1 mmHg. Left Atrium: Left atrial size was moderately dilated. Right Atrium: Right atrial size was normal in size. Pericardium: There is no evidence of pericardial effusion. Mitral Valve: The mitral valve is degenerative in appearance. There is mild thickening of the mitral valve leaflet(s). There is mild calcification of the mitral valve leaflet(s). Moderate mitral annular calcification. Mild mitral valve regurgitation. No evidence of mitral valve stenosis. Tricuspid Valve: The tricuspid valve is normal in structure. Tricuspid valve regurgitation is trivial. No evidence of tricuspid stenosis. Aortic Valve: The aortic valve is tricuspid. There is mild calcification of the aortic valve. There is mild thickening of the aortic valve. Aortic valve regurgitation is trivial. Aortic valve sclerosis is present, with no evidence of aortic valve stenosis. Pulmonic Valve: The pulmonic valve was normal in structure. Pulmonic valve regurgitation is trivial. No evidence of pulmonic stenosis. Aorta: The aortic root is normal in size and structure. Venous: The inferior vena cava is normal in size with greater than 50% respiratory variability, suggesting right atrial pressure of 3 mmHg. IAS/Shunts: No atrial level shunt detected by color flow Doppler.  LEFT VENTRICLE PLAX 2D LVIDd:         4.60 cm   Diastology LVIDs:          3.40 cm   LV e' medial:    4.66 cm/s LV PW:         1.00 cm   LV E/e' medial:  19.7 LV IVS:        1.30 cm   LV e' lateral:   15.50 cm/s LVOT diam:     2.20 cm   LV E/e' lateral: 5.9 LV SV:         108 LV SV Index:   56 LVOT Area:     3.80 cm  RIGHT VENTRICLE RV Basal diam:  4.30 cm RV Mid diam:    3.50 cm RV S prime:     21.80 cm/s TAPSE (M-mode): 1.5 cm LEFT ATRIUM             Index        RIGHT ATRIUM           Index LA diam:        4.00 cm 2.06 cm/m   RA Area:     19.00 cm LA Vol (A2C):   71.7 ml 36.97 ml/m  RA Volume:   51.60 ml  26.60 ml/m LA Vol (A4C):  96.7 ml 49.85 ml/m LA Biplane Vol: 87.0 ml 44.85 ml/m  AORTIC VALVE LVOT Vmax:   127.00 cm/s LVOT Vmean:  94.800 cm/s LVOT VTI:    0.285 m  AORTA Ao Root diam: 3.70 cm Ao Asc diam:  3.70 cm MITRAL VALVE               TRICUSPID VALVE MV Area (PHT): 2.55 cm    TR Peak grad:   13.1 mmHg MV Decel Time: 297 msec    TR Vmax:        181.00 cm/s MV E velocity: 92.00 cm/s MV A velocity: 90.30 cm/s  SHUNTS MV E/A ratio:  1.02        Systemic VTI:  0.29 m                            Systemic Diam: 2.20 cm Oneil Parchment MD Electronically signed by Oneil Parchment MD Signature Date/Time: 04/16/2024/11:50:06 AM    Final    CT ANGIO HEAD NECK W WO CM Result Date: 04/16/2024 EXAM: CTA HEAD AND NECK WITHOUT AND WITH 04/16/2024 05:41:14 AM TECHNIQUE: CTA of the head and neck was performed without and with the administration of 75 mL of iohexol  (OMNIPAQUE ) 350 MG/ML injection. Multiplanar 2D and/or 3D reformatted images are provided for review. Automated exposure control, iterative reconstruction, and/or weight based adjustment of the mA/kV was utilized to reduce the radiation dose to as low as reasonably achievable. Stenosis of the internal carotid arteries measured using NASCET criteria. COMPARISON: CT of the head dated 04/15/2024 CLINICAL HISTORY: Stroke/TIA, determine embolic source. FINDINGS: CTA NECK: AORTIC ARCH AND ARCH VESSELS: No dissection or arterial injury.  No significant stenosis of the brachiocephalic or subclavian arteries. There is calcific atheromatous disease within the aortic arch. CERVICAL CAROTID ARTERIES: No dissection or arterial injury. There is moderate calcific plaque within the carotid bulbs and origins of the internal carotid arteries bilaterally. There is near occlusive stenosis of the proximal right internal carotid artery with estimated stenosis 90% or greater. There is extensive calcific plaque within the proximal left internal carotid artery with estimated stenosis approximately 60 to 70%. CERVICAL VERTEBRAL ARTERIES: No dissection, arterial injury, or significant stenosis. LUNGS AND MEDIASTINUM: Unremarkable. SOFT TISSUES: No acute abnormality. BONES: No acute abnormality. CTA HEAD: ANTERIOR CIRCULATION: There is moderately advanced calcific atheromatous disease within the carotid siphons. There is moderate stenosis of the cavernous and supraclinoid segments, approximately 40 to 50% bilaterally. No significant stenosis of the anterior cerebral arteries. No significant stenosis of the middle cerebral arteries. No aneurysm. POSTERIOR CIRCULATION: There is moderate-to-severe stenosis of the P2 segment of the left posterior cerebral artery and there is moderate stenosis of the P2 segment of the right posterior cerebral artery. The focal stenoses are demonstrated on images 104 and 106 of series 9, respectively. No significant stenosis of the basilar artery. There is moderately advanced calcific atheromatous disease within the vertebral arteries. No aneurysm. OTHER: There is age-related atrophy and moderately advanced cerebral white matter disease. Chronic encephalomalacia changes are noted within the frontal and parietal lobes bilaterally from remote cortical infarcts. Chronic infarcts are also noted within the cerebellar hemispheres bilaterally. No dural venous sinus thrombosis on this non-dedicated study. IMPRESSION: 1. Near occlusive stenosis of the  proximal right internal carotid artery with estimated stenosis 90% or greater. 2. Extensive calcific plaque within the proximal left internal carotid artery with estimated stenosis approximately 60 to 70%. 3. Moderate-to-severe stenosis of the P2 segment  of the left posterior cerebral artery and moderate stenosis of the P2 segment of the right posterior cerebral artery. 4. Moderate stenosis of the cavernous and supraclinoid segments, approximately 40 to 50% bilaterally. Electronically signed by: Evalene Coho MD 04/16/2024 06:14 AM EST RP Workstation: [id number]   MR BRAIN WO CONTRAST Result Date: 04/15/2024 EXAM: MRI BRAIN WITHOUT CONTRAST 04/15/2024 07:46:35 PM TECHNIQUE: Multiplanar multisequence MRI of the head/brain was performed without the administration of intravenous contrast. COMPARISON: CT head from earlier today. MRI head 08/30/2016. CLINICAL HISTORY: Neuro deficit, acute, stroke suspected FINDINGS: BRAIN AND VENTRICLES: Punctate acute infarcts in the high right frontal and left parasagittal frontal lobes (series 2, images 39 and 37). No intracranial hemorrhage. No mass. No midline shift. No hydrocephalus. Advanced T2 hyperintensity in the white matter, compatible with chronic avascular ischemic disease. Remote lacunar infarcts in the corona radiata and bilateral parietal cortex. Multiple remote cerebellar infarcts bilaterally. Normal flow voids. ORBITS: No acute abnormality. SINUSES AND MASTOIDS: No acute abnormality. BONES AND SOFT TISSUES: Normal marrow signal. No acute soft tissue abnormality. IMPRESSION: 1. Punctate acute infarcts in the high right frontal and left parasagittal frontal lobes. 2. 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 Electronically signed by: Gilmore Molt MD 04/15/2024 09:43 PM EST RP Workstation: [id number]   CT HEAD WO CONTRAST Result Date: 04/15/2024 CLINICAL DATA:  Clemens out of bed, neurologic deficit EXAM: CT HEAD  WITHOUT CONTRAST TECHNIQUE: Contiguous axial images were obtained from the base of the skull through the vertex without intravenous contrast. RADIATION DOSE REDUCTION: This exam was performed according to the departmental dose-optimization program which includes automated exposure control, adjustment of the mA and/or kV according to patient size and/or use of iterative reconstruction technique. COMPARISON:  08/31/2016 FINDINGS: Brain: There are chronic cortical infarcts along the bilateral frontal and parietal convexities, as well as within the bilateral cerebellar hemispheres. Chronic small vessel ischemic changes are seen throughout the periventricular and subcortical white matter. No evidence of acute infarct or hemorrhage. Dense bilateral basal ganglia calcifications are again noted. Lateral ventricles and midline structures are otherwise unremarkable. No acute extra-axial fluid collections. No mass effect. Vascular: No hyperdense vessel or unexpected calcification. Skull: Normal. Negative for fracture or focal lesion. Sinuses/Orbits: No acute finding. Other: None. IMPRESSION: 1. No acute intracranial process. 2. Chronic ischemic changes as above. Electronically Signed   By: Ozell Daring M.D.   On: 04/15/2024 17:50     PHYSICAL EXAM  Temp:  [97.6 F (36.4 C)-98.1 F (36.7 C)] 98.1 F (36.7 C) (12/16 1153) Pulse Rate:  [63-88] 76 (12/16 1153) Resp:  [4-23] 12 (12/16 1153) BP: (100-132)/(53-75) 105/53 (12/16 1153) SpO2:  [96 %-100 %] 100 % (12/16 1153) Arterial Line BP: (117-162)/(46-67) 124/46 (12/16 0200)  General - Well nourished, well developed, in no apparent distress.  Mental Status -  Level of arousal and orientation to time, place, and person were intact. Language including expression, naming, repetition, comprehension was assessed and found intact. Attention span and concentration were normal. Recent and remote memory were intact. Fund of Knowledge was assessed and was  intact.  Cranial Nerves II - XII - II - Visual field intact OU. III, IV, VI - Extraocular movements intact. V - Facial sensation intact bilaterally. VII - Facial movement intact bilaterally. VIII - Hearing & vestibular intact bilaterally. X - Palate elevates symmetrically. XI - Chin turning & shoulder shrug intact bilaterally. XII - Tongue protrusion intact.  Motor Strength - L arm and leg 5/5. R arm 5/5.  R hip flexion 4/5, knee extension 5/5, dorsi and plantar flexion 4/5. Bulk was normal and fasciculations were absent. Overall improved significantly from yesterday.  Sensory - Light touch were assessed and were symmetrical.    Coordination -   Tremor was absent.  Gait and Station - deferred.   ASSESSMENT/PLAN Mr. Christopher Roberts is a 78 y.o. male with history of CAD s/p CABG, DM, DJD, HLD, HTN admitted for Acute R sided weakness. Imaging shows R ICA >90% stenosis, L ICA 60-70% stenosis, with likely L ICA related symptoms. Need to keep him from hypotension. VSS planning on TCAR on Monday 04/21/2024. Heme/Onc consulted for pancytopenia: recommended platelets >50k for procedure and Hgb >7.0 and to transfuse as needed.    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 2D Echo: EF 60-65%, LA moderately dilated, RA normal in size CUS R ICA 60-79% stenosis, L ICA 40-59% stenosis LDL 78 HgbA1c 5.9 UDS negative aspirin  81 mg daily and clopidogrel  75 mg daily prior to admission, now on aspirin  81 mg daily and clopidogrel  75 mg daily.  Patient counseled to be compliant with his antithrombotic medications Ongoing aggressive stroke risk factor management Therapy recommendations: CIR Disposition:  Pending  Fluctuating right-sided weakness 12/10 Reported by EMS patient was flaccid on right side, improved in the ER.  However this morning had worsening weakness on right side again when BP on the low end Likely due to flow-limiting stenosis of bilateral ICAs Off IV fluid Encourage p.o. intake Avoid low BP perioperatively  ICA stenosis CTA: R ICA Stenosis >90%, L ICA Stenosis 60-70% CUS R ICA 60-79% stenosis, L ICA 40-59% stenosis Although right stenosis more significant, the left stenosis was more symptomatic Vascular surgery consulted, L TCAR performed 12/15, appreciate Dr. Gaylene assistance  Pancytopenia WBC 2.1--1.8--1.7 Hemoglobin 10.3-9.3-10.6 Platelet 52-45-49 Initial plan for bone marrow biopsy next week to rule out MDS Oncology consulted, recommended platelets >50k for procedure and Hgb >7.0 and to transfuse as needed.  Hypertension Home meds including Coreg  BP 120s now Hold off Coreg  for now Avoid low BP BP goal 140-160 before carotid revascularization  Hyperlipidemia Home meds:  Lipitor  80mg  daily LDL 78, goal < 70 Now on Lipitor  80mg  daily Continue statin at discharge  Other Stroke Risk Factors Advanced age Coronary artery disease supposed CABG  Other medical issues AKI on CKD, creatinine 1.13-1.27-1.16 Depression on Prozac   Hospital day # 6  Patient has remained neurologically stable but still has mild right-sided weakness.  Continue mild permissive hypertension with systolic 130-150 range.  Continue dual antiplatelet therapy.       CHARM Penne Mori, DO, PGY1 Neurology Stroke Team  04/22/2024 11:54 AM  I have personally obtained history,examined this patient, reviewed notes, independently viewed imaging studies, participated in medical decision making and plan of care.ROS completed by me personally and pertinent positives fully documented  I have made any additions or clarifications directly to the above note. Agree with note above.    Eather Popp, MD Medical Director [facility] Pager: [phone] 04/22/2024 2:43 PM     To contact Stroke Continuity provider, please refer to [email or web address]. After hours, contact General Neurology

## 2024-04-22 NOTE — Progress Notes (Signed)
 [facility]  Subjective  -no complaints   Objective (!) 101/58 64 97.8 F (36.6 C) (Oral) 15 98%  Intake/Output Summary (Last 24 hours) at 04/22/2024 0626 Last data filed at 04/21/2024 2000 Gross per 24 hour  Intake 1291 ml  Output 145 ml  Net 1146 ml    Left neck incision looks good without hematoma Grossly neurologically intact and at his neurologic baseline Right leg 4+/5  Laboratory Lab Results: Recent Labs    04/21/24 0404 04/22/24 0308  WBC 2.4* 2.0*  HGB 8.1* 8.4*  HCT 23.4* 23.9*  PLT 49* 50*   BMET Recent Labs    04/21/24 0404 04/22/24 0308  NA 139 141  K 3.8 4.1  CL 109 108  CO2 22 21*  GLUCOSE 105* 204*  BUN 18 21  CREATININE 1.14 1.26*  CALCIUM  8.1* 8.4*    COAG Lab Results  Component Value Date   INR 1.3 (H) 04/15/2024   INR 1.1 07/25/2020   INR 1.1 (H) 10/25/2010   No results found for: PTT  Assessment/Planning:  Postop day 1 status post left TCAR for symptomatic 60 to 70% stenosis following stroke.  Looks good this morning and at his neurologic baseline.  Neck incision looks good.  Platelets 50.  Needs aspirin  statin Plavix  at discharge.  1 month follow-up with duplex.  Will need staged procedure on the right side as we discussed.  Lonni JINNY Gaskins 04/22/2024 6:26 AM --

## 2024-04-22 NOTE — Plan of Care (Signed)
  Problem: Education: Goal: Ability to describe self-care measures that may prevent or decrease complications (Diabetes Survival Skills Education) will improve Outcome: Progressing Goal: Individualized Educational Video(s) Outcome: Progressing   Problem: Coping: Goal: Ability to adjust to condition or change in health will improve Outcome: Progressing   Problem: Fluid Volume: Goal: Ability to maintain a balanced intake and output will improve Outcome: Progressing   Problem: Health Behavior/Discharge Planning: Goal: Ability to identify and utilize available resources and services will improve Outcome: Progressing Goal: Ability to manage health-related needs will improve Outcome: Progressing   Problem: Metabolic: Goal: Ability to maintain appropriate glucose levels will improve Outcome: Progressing   Problem: Nutritional: Goal: Maintenance of adequate nutrition will improve Outcome: Progressing Goal: Progress toward achieving an optimal weight will improve Outcome: Progressing   Problem: Skin Integrity: Goal: Risk for impaired skin integrity will decrease Outcome: Progressing   Problem: Tissue Perfusion: Goal: Adequacy of tissue perfusion will improve Outcome: Progressing   Problem: Education: Goal: Knowledge of disease or condition will improve Outcome: Progressing Goal: Knowledge of secondary prevention will improve (MUST DOCUMENT ALL) Outcome: Progressing Goal: Knowledge of patient specific risk factors will improve (DELETE if not current risk factor) Outcome: Progressing   Problem: Ischemic Stroke/TIA Tissue Perfusion: Goal: Complications of ischemic stroke/TIA will be minimized Outcome: Progressing   Problem: Coping: Goal: Will verbalize positive feelings about self Outcome: Progressing Goal: Will identify appropriate support needs Outcome: Progressing   Problem: Health Behavior/Discharge Planning: Goal: Ability to manage health-related needs will  improve Outcome: Progressing Goal: Goals will be collaboratively established with patient/family Outcome: Progressing   Problem: Self-Care: Goal: Ability to participate in self-care as condition permits will improve Outcome: Progressing Goal: Verbalization of feelings and concerns over difficulty with self-care will improve Outcome: Progressing Goal: Ability to communicate needs accurately will improve Outcome: Progressing   Problem: Nutrition: Goal: Risk of aspiration will decrease Outcome: Progressing Goal: Dietary intake will improve Outcome: Progressing   Problem: Education: Goal: Knowledge of General Education information will improve Description: Including pain rating scale, medication(s)/side effects and non-pharmacologic comfort measures Outcome: Progressing   Problem: Health Behavior/Discharge Planning: Goal: Ability to manage health-related needs will improve Outcome: Progressing   Problem: Clinical Measurements: Goal: Ability to maintain clinical measurements within normal limits will improve Outcome: Progressing Goal: Will remain free from infection Outcome: Progressing Goal: Diagnostic test results will improve Outcome: Progressing Goal: Respiratory complications will improve Outcome: Progressing Goal: Cardiovascular complication will be avoided Outcome: Progressing   Problem: Activity: Goal: Risk for activity intolerance will decrease Outcome: Progressing   Problem: Nutrition: Goal: Adequate nutrition will be maintained Outcome: Progressing   Problem: Coping: Goal: Level of anxiety will decrease Outcome: Progressing   Problem: Elimination: Goal: Will not experience complications related to bowel motility Outcome: Progressing Goal: Will not experience complications related to urinary retention Outcome: Progressing   Problem: Pain Managment: Goal: General experience of comfort will improve and/or be controlled Outcome: Progressing   Problem:  Safety: Goal: Ability to remain free from injury will improve Outcome: Progressing   Problem: Skin Integrity: Goal: Risk for impaired skin integrity will decrease Outcome: Progressing   Problem: Education: Goal: Knowledge of discharge needs will improve Outcome: Progressing   Problem: Clinical Measurements: Goal: Postoperative complications will be avoided or minimized Outcome: Progressing   Problem: Respiratory: Goal: Will achieve and/or maintain a regular respiratory rate, without signs or symptoms of dyspnea Outcome: Progressing   Problem: Skin Integrity: Goal: Demonstration of wound healing without infection will improve Outcome: Progressing

## 2024-04-22 NOTE — Progress Notes (Addendum)
 Physical Therapy Treatment Patient Details Name: Christopher Roberts MRN: [id number] DOB: 1945-09-24 Today's Date: 04/22/2024   History of Present Illness 78 y.o. male admitted 04/15/24 s/p fall with RLE weakness. CTA Head/Neck showed right ICA stenosis >90, left ICA stenosis 60-70%. MRI demonstrated acute punctate CVA. Underwent L TCAR 12/15. PMHx: CAD s/p PCI 2022, T2DM, pancytopenia, HTN, DJD, HLD, MI, and CABGx3. Oncologist worked up for possible MDS.    PT Comments  Patient with continued R LE limitations with weakness, ataxia and poor attention.  He can utilize for functional lift in stabilized bridging though needs A for proper positioning throughout.  Also in standing had legs crossed initially, able to step to recliner with RW and mod A.  Patient quickly fatigued and would need +2 A for ambulation attempt.  PT will continue to follow.     If plan is discharge home, recommend the following: A lot of help with walking and/or transfers;Assistance with cooking/housework;A little help with bathing/dressing/bathroom;Assist for transportation;Help with stairs or ramp for entrance   Can travel by private vehicle        Equipment Recommendations  Wheelchair (measurements PT);Wheelchair cushion (measurements PT);BSC/3in1    Recommendations for Other Services       Precautions / Restrictions Precautions Precautions: Fall Recall of Precautions/Restrictions: Impaired     Mobility  Bed Mobility Overal bed mobility: Needs Assistance Bed Mobility: Supine to Sit     Supine to sit: Mod assist     General bed mobility comments: difficulty getting R LE off EOB, using rails then with R and posterior lean needing support in sitting and to scoot to EOB    Transfers Overall transfer level: Needs assistance Equipment used: Rolling walker (2 wheels) Transfers: Sit to/from Stand, Bed to chair/wheelchair/BSC Sit to Stand: Mod assist   Step pivot transfers: Mod assist       General transfer  comment: assist initially for foot placement, cues for hand placement and anterior weight shift, lifting help to stand then with R foot on the other side of L in standing with RW and pt able to step to pivot though mod A for balance and with A for RW managment    Ambulation/Gait               General Gait Details: would need +2 for safety to ambulate   Stairs             Wheelchair Mobility     Tilt Bed    Modified Rankin (Stroke Patients Only) Modified Rankin (Stroke Patients Only) Pre-Morbid Rankin Score: No symptoms Modified Rankin: Moderately severe disability     Balance Overall balance assessment: Needs assistance   Sitting balance-Leahy Scale: Poor Sitting balance - Comments: initially leaning with mod to min A for balance, able to sit with S When holding footboard with L hand   Standing balance support: Bilateral upper extremity supported Standing balance-Leahy Scale: Poor Standing balance comment: UE support and min to mod A for balance standing                            Communication Communication Communication: No apparent difficulties  Cognition Arousal: Alert Behavior During Therapy: WFL for tasks assessed/performed   PT - Cognitive impairments: Problem solving, Attention                       PT - Cognition Comments: some diminished attention to R LE, slow processing  Following commands: Intact      Cueing Cueing Techniques: Verbal cues, Gestural cues  Exercises General Exercises - Lower Extremity Ankle Circles/Pumps: AROM, Both, 10 reps, Supine Hip ABduction/ADduction: Strengthening, Both, 5 reps, Supine (adductor squeeze in hooklying with rolled blanket between knees) Low Level/ICU Exercises Stabilized Bridging: Strengthening, Both, 5 reps, Supine (5 sec hold with A For R LE placement)    General Comments General comments (skin integrity, edema, etc.): note with soiled gown and bed linen, RN report took foley out  this am, BP 101/67 sitting EOB mild dizziness reported, about the same once in chair and pt reported feeling improved symptoms      Pertinent Vitals/Pain Pain Assessment Pain Assessment: Faces Faces Pain Scale: Hurts a little bit Pain Location: neck post TCAR Pain Descriptors / Indicators: Sore Pain Intervention(s): Monitored during session    Home Living                          Prior Function            PT Goals (current goals can now be found in the care plan section) Progress towards PT goals: Progressing toward goals (slowly)    Frequency    Min 3X/week      PT Plan      Co-evaluation              AM-PAC PT 6 Clicks Mobility   Outcome Measure  Help needed turning from your back to your side while in a flat bed without using bedrails?: A Little Help needed moving from lying on your back to sitting on the side of a flat bed without using bedrails?: A Lot Help needed moving to and from a bed to a chair (including a wheelchair)?: A Lot Help needed standing up from a chair using your arms (e.g., wheelchair or bedside chair)?: A Lot Help needed to walk in hospital room?: Total Help needed climbing 3-5 steps with a railing? : Total 6 Click Score: 11    End of Session Equipment Utilized During Treatment: Gait belt Activity Tolerance: Patient limited by fatigue Patient left: in chair;with call bell/phone within reach Nurse Communication: Mobility status (bed soiled) PT Visit Diagnosis: Other abnormalities of gait and mobility (R26.89);Hemiplegia and hemiparesis;Other symptoms and signs involving the nervous system (R29.898) Hemiplegia - Right/Left: Right Hemiplegia - dominant/non-dominant: Dominant Hemiplegia - caused by: Cerebral infarction     Time: 9054-8991 PT Time Calculation (min) (ACUTE ONLY): 23 min  Charges:    $Therapeutic Activity: 23-37 mins PT General Charges $$ ACUTE PT VISIT: 1 Visit                     Micheline Portal,  PT Acute Rehabilitation Services Office:[phone] 04/22/2024    [name] 04/22/2024, 11:57 AM

## 2024-04-22 NOTE — Care Management Important Message (Signed)
 Important Message  Patient Details  Name: Christopher Roberts MRN: [id number] Date of Birth: 1945-09-19   Important Message Given:  Yes - Medicare IM     Vonzell Arrie Sharps 04/22/2024, 10:50 AM

## 2024-04-22 NOTE — Progress Notes (Signed)
 PHARMACIST LIPID MONITORING   Christopher Roberts is a 78 y.o. male admitted on 04/15/2024 with L ICA stenosis.  Pharmacy has been consulted to optimize lipid-lowering therapy with the indication of secondary prevention for clinical ASCVD.  Recent Labs:  Lipid Panel (last 6 months):   Lab Results  Component Value Date   CHOL 98 04/22/2024   TRIG 46 04/22/2024   HDL 25 (L) 04/22/2024   CHOLHDL 3.9 04/22/2024   VLDL 9 04/22/2024   LDLCALC 64 04/22/2024    Hepatic function panel (last 6 months):   Lab Results  Component Value Date   AST 35 04/22/2024   ALT 21 04/22/2024   ALKPHOS 92 04/22/2024   BILITOT 1.3 (H) 04/22/2024    SCr (since admission):   Serum creatinine: 1.26 mg/dL (H) 87/83/74 9691 Estimated creatinine clearance: 49.1 mL/min (A)  Current therapy and lipid therapy tolerance Current lipid-lowering therapy: Atorvastatin  80mg  daily Previous lipid-lowering therapies (if applicable): n/a Documented or reported allergies or intolerances to lipid-lowering therapies (if applicable): none  Assessment:   Patient on appropriate lipid therapy at this time.  Plan:    1.Statin intensity (high intensity recommended for all patients regardless of the LDL):  No statin changes. The patient is already on a high intensity statin.  2.Add ezetimibe (if any one of the following):   Not indicated at this time.  3.Refer to lipid clinic:   No  4.Follow-up with:  Primary care provider - [name], Christopher PARAS, MD  5.Follow-up labs after discharge:  No changes in lipid therapy, repeat a lipid panel in one year.       [employer], Pharm.D., BCPS Clinical Pharmacist Clinical phone for 04/22/2024 from 7:30-3:00 is [phone].  **Pharmacist phone directory can be found on [email or web address] listed under [facility].  04/22/2024 10:22 AM

## 2024-04-23 DIAGNOSIS — I129 Hypertensive chronic kidney disease with stage 1 through stage 4 chronic kidney disease, or unspecified chronic kidney disease: Secondary | ICD-10-CM | POA: Diagnosis not present

## 2024-04-23 DIAGNOSIS — I63233 Cerebral infarction due to unspecified occlusion or stenosis of bilateral carotid arteries: Secondary | ICD-10-CM | POA: Diagnosis not present

## 2024-04-23 DIAGNOSIS — I779 Disorder of arteries and arterioles, unspecified: Secondary | ICD-10-CM | POA: Diagnosis not present

## 2024-04-23 DIAGNOSIS — I639 Cerebral infarction, unspecified: Secondary | ICD-10-CM | POA: Diagnosis not present

## 2024-04-23 DIAGNOSIS — N189 Chronic kidney disease, unspecified: Secondary | ICD-10-CM | POA: Diagnosis not present

## 2024-04-23 LAB — GLUCOSE, CAPILLARY
Glucose-Capillary: 123 mg/dL — ABNORMAL HIGH (ref 70–99)
Glucose-Capillary: 118 mg/dL — ABNORMAL HIGH (ref 70–99)
Glucose-Capillary: 108 mg/dL — ABNORMAL HIGH (ref 70–99)
Glucose-Capillary: 151 mg/dL — ABNORMAL HIGH (ref 70–99)
Glucose-Capillary: 115 mg/dL — ABNORMAL HIGH (ref 70–99)

## 2024-04-23 LAB — CBC WITH DIFFERENTIAL/PLATELET
Basophils Absolute: 0.1 K/uL (ref 0.0–0.1)
Basophils Relative: 2 %
Eosinophils Absolute: 0 K/uL (ref 0.0–0.5)
Eosinophils Relative: 1 %
HCT: 26.4 % — ABNORMAL LOW (ref 39.0–52.0)
Hemoglobin: 9 g/dL — ABNORMAL LOW (ref 13.0–17.0)
Lymphocytes Relative: 37 %
Lymphs Abs: 0.9 K/uL (ref 0.7–4.0)
MCH: 30.9 pg (ref 26.0–34.0)
MCHC: 34.1 g/dL (ref 30.0–36.0)
MCV: 90.7 fL (ref 80.0–100.0)
Monocytes Absolute: 0.1 K/uL (ref 0.1–1.0)
Monocytes Relative: 4 %
Neutro Abs: 1.4 K/uL — ABNORMAL LOW (ref 1.7–7.7)
Neutrophils Relative %: 56 %
Platelets: 53 K/uL — ABNORMAL LOW (ref 150–400)
RBC: 2.91 MIL/uL — ABNORMAL LOW (ref 4.22–5.81)
RDW: 18.8 % — ABNORMAL HIGH (ref 11.5–15.5)
WBC: 2.5 K/uL — ABNORMAL LOW (ref 4.0–10.5)
nRBC: 1.2 % — ABNORMAL HIGH (ref 0.0–0.2)

## 2024-04-23 LAB — BASIC METABOLIC PANEL WITH GFR
Anion gap: 7 (ref 5–15)
BUN: 22 mg/dL (ref 8–23)
CO2: 23 mmol/L (ref 22–32)
Calcium: 8.5 mg/dL — ABNORMAL LOW (ref 8.9–10.3)
Chloride: 108 mmol/L (ref 98–111)
Creatinine, Ser: 1.12 mg/dL (ref 0.61–1.24)
GFR, Estimated: >60 mL/min (ref >60)
Glucose, Bld: 124 mg/dL — ABNORMAL HIGH (ref 70–99)
Potassium: 4.4 mmol/L (ref 3.5–5.1)
Sodium: 139 mmol/L (ref 135–145)

## 2024-04-23 LAB — LACTATE DEHYDROGENASE: LDH: 227 U/L (ref 105–235)

## 2024-04-23 LAB — POCT ACTIVATED CLOTTING TIME: Activated Clotting Time: 240 s

## 2024-04-23 LAB — MAGNESIUM: Magnesium: 1.7 mg/dL (ref 1.7–2.4)

## 2024-04-23 LAB — PHOSPHORUS: Phosphorus: 2.6 mg/dL (ref 2.5–4.6)

## 2024-04-23 MED ORDER — MAGNESIUM SULFATE 2 GM/50ML IV SOLN
2.0000 g | Freq: Once | INTRAVENOUS | Status: AC
  Administered 2024-04-23: 13:00:00 2 g via INTRAVENOUS
  Filled 2024-04-23: qty 50

## 2024-04-23 NOTE — Plan of Care (Signed)
  Problem: Education: Goal: Ability to describe self-care measures that may prevent or decrease complications (Diabetes Survival Skills Education) will improve Outcome: Progressing Goal: Individualized Educational Video(s) Outcome: Progressing   Problem: Coping: Goal: Ability to adjust to condition or change in health will improve Outcome: Progressing   Problem: Fluid Volume: Goal: Ability to maintain a balanced intake and output will improve Outcome: Progressing   Problem: Health Behavior/Discharge Planning: Goal: Ability to identify and utilize available resources and services will improve Outcome: Progressing Goal: Ability to manage health-related needs will improve Outcome: Progressing   Problem: Metabolic: Goal: Ability to maintain appropriate glucose levels will improve Outcome: Progressing   Problem: Nutritional: Goal: Maintenance of adequate nutrition will improve Outcome: Progressing Goal: Progress toward achieving an optimal weight will improve Outcome: Progressing   Problem: Skin Integrity: Goal: Risk for impaired skin integrity will decrease Outcome: Progressing   Problem: Tissue Perfusion: Goal: Adequacy of tissue perfusion will improve Outcome: Progressing   Problem: Education: Goal: Knowledge of disease or condition will improve Outcome: Progressing Goal: Knowledge of secondary prevention will improve (MUST DOCUMENT ALL) Outcome: Progressing Goal: Knowledge of patient specific risk factors will improve (DELETE if not current risk factor) Outcome: Progressing   Problem: Ischemic Stroke/TIA Tissue Perfusion: Goal: Complications of ischemic stroke/TIA will be minimized Outcome: Progressing   Problem: Coping: Goal: Will verbalize positive feelings about self Outcome: Progressing Goal: Will identify appropriate support needs Outcome: Progressing   Problem: Health Behavior/Discharge Planning: Goal: Ability to manage health-related needs will  improve Outcome: Progressing Goal: Goals will be collaboratively established with patient/family Outcome: Progressing   Problem: Self-Care: Goal: Ability to participate in self-care as condition permits will improve Outcome: Progressing Goal: Verbalization of feelings and concerns over difficulty with self-care will improve Outcome: Progressing Goal: Ability to communicate needs accurately will improve Outcome: Progressing   Problem: Nutrition: Goal: Risk of aspiration will decrease Outcome: Progressing Goal: Dietary intake will improve Outcome: Progressing   Problem: Education: Goal: Knowledge of General Education information will improve Description: Including pain rating scale, medication(s)/side effects and non-pharmacologic comfort measures Outcome: Progressing   Problem: Health Behavior/Discharge Planning: Goal: Ability to manage health-related needs will improve Outcome: Progressing   Problem: Clinical Measurements: Goal: Ability to maintain clinical measurements within normal limits will improve Outcome: Progressing Goal: Will remain free from infection Outcome: Progressing Goal: Diagnostic test results will improve Outcome: Progressing Goal: Respiratory complications will improve Outcome: Progressing Goal: Cardiovascular complication will be avoided Outcome: Progressing   Problem: Activity: Goal: Risk for activity intolerance will decrease Outcome: Progressing   Problem: Nutrition: Goal: Adequate nutrition will be maintained Outcome: Progressing   Problem: Coping: Goal: Level of anxiety will decrease Outcome: Progressing   Problem: Elimination: Goal: Will not experience complications related to bowel motility Outcome: Progressing Goal: Will not experience complications related to urinary retention Outcome: Progressing   Problem: Pain Managment: Goal: General experience of comfort will improve and/or be controlled Outcome: Progressing   Problem:  Safety: Goal: Ability to remain free from injury will improve Outcome: Progressing   Problem: Skin Integrity: Goal: Risk for impaired skin integrity will decrease Outcome: Progressing   Problem: Education: Goal: Knowledge of discharge needs will improve Outcome: Progressing   Problem: Clinical Measurements: Goal: Postoperative complications will be avoided or minimized Outcome: Progressing   Problem: Respiratory: Goal: Will achieve and/or maintain a regular respiratory rate, without signs or symptoms of dyspnea Outcome: Progressing   Problem: Skin Integrity: Goal: Demonstration of wound healing without infection will improve Outcome: Progressing

## 2024-04-23 NOTE — Progress Notes (Signed)
° °  Inpatient Rehabilitation Admissions Coordinator   I met with patient and his wife at bedside and reviewed estimated cost of care, goals, and expectations of Cir admit. She prefers CIR admit. I await payor approval.  Heron Leavell, RN, MSN Rehab Admissions Coordinator [phone] 04/23/2024 4:28 PM

## 2024-04-23 NOTE — Progress Notes (Signed)
° ° °  Inpatient Rehabilitation Admissions Coordinator   Met with patient at bedside for rehab assessment. We discussed goals and expectations of a possible CIR admit. He prefers CIR for rehab. Family can provide expected caregiver support that is recommended. He lives at home with his wife and they are carrying for 3 granddaughters in the home. I will begin insurance Auth with [facility] Medicare for possible CIR admit pending approval. Please call me with any questions.   Heron Leavell, RN, MSN Rehab Admissions Coordinator [phone]

## 2024-04-23 NOTE — Progress Notes (Signed)
 Occupational Therapy Treatment Patient Details Name: Christopher Roberts MRN: [id number] DOB: [birth year] Today's Date: 04/23/2024   History of present illness 78 y.o. male admitted 04/15/24 s/p fall with RLE weakness. CTA Head/Neck showed right ICA stenosis >90, left ICA stenosis 60-70%. MRI demonstrated acute punctate CVA. Underwent L TCAR 12/15. PMHx: CAD s/p PCI 2022, T2DM, pancytopenia, HTN, DJD, HLD, MI, and CABGx3. Oncologist worked up for possible MDS.   OT comments  Pt progressing well towards goals. Progressed to complete LB dressing with min assist for STS. Completed x3 reps with STS progressing to CGA. Educated pt on use of hemidressing techniques, pt able to return demo understanding. Progressed mobility to min assist for 12 ft. Required min assist to initiate RLE movement, however with repetition faded to CGA. Decreased balance, strength, and sensation continue to limit pt. Continue to recommend >3 hours of skilled rehab daily to optimize independence levels. Will continue to follow acutely.       If plan is discharge home, recommend the following:  A lot of help with walking and/or transfers;A little help with bathing/dressing/bathroom;Assistance with cooking/housework;Assist for transportation;Help with stairs or ramp for entrance   Equipment Recommendations  Other (comment) (Defer to next venue)    Recommendations for Other Services Rehab consult    Precautions / Restrictions Precautions Precautions: Fall Recall of Precautions/Restrictions: Impaired Restrictions Weight Bearing Restrictions Per Provider Order: No       Mobility Bed Mobility Overal bed mobility: Needs Assistance Bed Mobility: Sit to Supine       Sit to supine: Supervision   General bed mobility comments: S for safety    Transfers Overall transfer level: Needs assistance Equipment used: Rolling walker (2 wheels) Transfers: Sit to/from Stand, Bed to chair/wheelchair/BSC Sit to Stand: Min assist            General transfer comment: Cues for hand placement. min assist to boost. Once in standing min assist to manage RLE with steps to mobilize 77ft. Initiation of RLE improved with repitition     Balance Overall balance assessment: Needs assistance Sitting-balance support: No upper extremity supported, Feet supported Sitting balance-Leahy Scale: Poor     Standing balance support: Bilateral upper extremity supported Standing balance-Leahy Scale: Poor Standing balance comment: Reliant on UE support       ADL either performed or assessed with clinical judgement   ADL Overall ADL's : Needs assistance/impaired         Lower Body Dressing: Minimal assistance;Sit to/from stand Lower Body Dressing Details (indicate cue type and reason): pt  able to manage socks, min assist to stand Toilet Transfer: Minimal assistance;Ambulation;Rolling walker (2 wheels) Toilet Transfer Details (indicate cue type and reason): Increased distance min fading to CGA to manage RLE Toileting- Clothing Manipulation and Hygiene: Minimal assistance;Sit to/from stand       Functional mobility during ADLs: Minimal assistance;Rolling walker (2 wheels) General ADL Comments: Min assist to stand. Reviewed hemi dressing techniques    Extremity/Trunk Assessment Upper Extremity Assessment Upper Extremity Assessment: Overall WFL for tasks assessed   Lower Extremity Assessment Lower Extremity Assessment: Defer to PT evaluation        Vision   Vision Assessment?: No apparent visual deficits;Wears glasses for reading;Wears glasses for driving         Communication Communication Communication: Impaired Factors Affecting Communication: Difficulty expressing self   Cognition Arousal: Alert Behavior During Therapy: WFL for tasks assessed/performed Cognition: No apparent impairments     OT - Cognition Comments: WFL, pt with difficulty expressing  self, but problem solving and sequencing intact      Following commands: Intact        Cueing   Cueing Techniques: Verbal cues, Gestural cues        General Comments O2 sats briefly dropped to 87%. Improved to 92% with seated rest break    Pertinent Vitals/ Pain       Pain Assessment Pain Assessment: Faces Faces Pain Scale: Hurts a little bit Pain Location: neck post TCAR Pain Descriptors / Indicators: Sore Pain Intervention(s): Monitored during session   Frequency  Min 2X/week        Progress Toward Goals  OT Goals(current goals can now be found in the care plan section)  Progress towards OT goals: Progressing toward goals  Acute Rehab OT Goals Patient Stated Goal: To get better OT Goal Formulation: With patient/family Time For Goal Achievement: 05/02/24 Potential to Achieve Goals: Good ADL Goals Pt Will Perform Upper Body Dressing: with set-up Pt Will Perform Lower Body Dressing: with set-up Pt Will Transfer to Toilet: with set-up Pt Will Perform Toileting - Clothing Manipulation and hygiene: with set-up  Plan         AM-PAC OT 6 Clicks Daily Activity     Outcome Measure   Help from another person eating meals?: None Help from another person taking care of personal grooming?: A Little Help from another person toileting, which includes using toliet, bedpan, or urinal?: A Little Help from another person bathing (including washing, rinsing, drying)?: A Little Help from another person to put on and taking off regular upper body clothing?: A Little Help from another person to put on and taking off regular lower body clothing?: A Little 6 Click Score: 19    End of Session Equipment Utilized During Treatment: Gait belt;Rolling walker (2 wheels)  OT Visit Diagnosis: Unsteadiness on feet (R26.81);Other abnormalities of gait and mobility (R26.89);Muscle weakness (generalized) (M62.81);Hemiplegia and hemiparesis Hemiplegia - Right/Left: Right   Activity Tolerance Patient tolerated treatment well   Patient Left  in bed;with call bell/phone within reach;with bed alarm set   Nurse Communication Mobility status        Time: 8591-8565 OT Time Calculation (min): 26 min  Charges: OT General Charges $OT Visit: 1 Visit OT Treatments $Self Care/Home Management : 23-37 mins  Adrianne BROCKS, OT  Acute Rehabilitation Services Office [phone] Secure chat preferred   Adrianne GORMAN Savers 04/23/2024, 3:50 PM

## 2024-04-23 NOTE — Progress Notes (Signed)
° °  Brief Progress Note   _____________________________________________________________________________________________________________  Patient Name: Christopher Roberts Patient DOB: 23-May-1945 Date: @TODAY @   Pt is awaiting a bed for CIR. _____________________________________________________________________________________________________________  [facility] RN Expeditor [name] Please contact us  directly via secure chat (search for [facility]) or by calling us  at [phone] [facility]).

## 2024-04-23 NOTE — Consult Note (Signed)
 Physical Medicine and Rehabilitation Consult Reason for Consult: CVA Referring Physician: Meliton Monte, MD   HPI: Christopher Roberts is a 78 y.o. male with PMH significant for CAD s/p PCI in 2022, diabetes mellitus type 2, pancytopenia, who follows with Dr. Autumn with oncology and is being worked up for possible MDS, hypertension, who presented after a fall when trying to get up from bed around 10am when he lost balance and was on the floor for almost an hour before family member found him. He was later noticed to have right lower extremity weakness and was brought to the ED. MRI brain shows acute punctate CVA. Physical Medicine & Rehabilitation was consulted to assess candidacy for CIR.      Home: Home Living Family/patient expects to be discharged to:: Private residence Living Arrangements: Spouse/significant other Available Help at Discharge: Family, Available 24 hours/day Type of Home: House Home Access: Level entry Home Layout: Two level, Bed/bath upstairs, 1/2 bath on main level Alternate Level Stairs-Number of Steps: 12 Alternate Level Stairs-Rails: Left Bathroom Shower/Tub: Health Visitor: Standard Bathroom Accessibility: Yes Home Equipment: Agricultural Consultant (2 wheels), Cane - single point, Rollator (4 wheels), Shower seat - built in, Hand held shower head Additional Comments: Pt lives with wife who is able to assist as needed. Pt has granddaughters who live there too who can help  Lives With: Spouse, Family  Functional History: Prior Function Prior Level of Function : Independent/Modified Independent Mobility Comments: Mod I using cane, sometimes, RW. About 4 falls in the past 29mo. ADLs Comments: Indep with basic self care. Spouse can help him with medications, prompting him to take it. Drives in the daytime, very seldom. Retired since '05. Functional Status:  Mobility: Bed Mobility Overal bed mobility: Needs Assistance Bed Mobility: Supine to  Sit Supine to sit: Mod assist Sit to supine: Min assist General bed mobility comments: difficulty getting R LE off EOB, using rails then with R and posterior lean needing support in sitting and to scoot to EOB Transfers Overall transfer level: Needs assistance Equipment used: Rolling walker (2 wheels) Transfers: Sit to/from Stand, Bed to chair/wheelchair/BSC Sit to Stand: Mod assist Bed to/from chair/wheelchair/BSC transfer type:: Step pivot Step pivot transfers: Mod assist General transfer comment: assist initially for foot placement, cues for hand placement and anterior weight shift, lifting help to stand then with R foot on the other side of L in standing with RW and pt able to step to pivot though mod A for balance and with A for RW managment Ambulation/Gait Ambulation/Gait assistance: Mod assist Gait Distance (Feet): 2 Feet Assistive device: Rolling walker (2 wheels) Gait Pattern/deviations: Step-to pattern, Decreased weight shift to right, Decreased stance time - right, Decreased dorsiflexion - right General Gait Details: would need +2 for safety to ambulate Pre-gait activities: Pt engaged in standing weight shifts inside RW with minA. He c/o worsening dizziness. Instructed pt to return to sitting EOB.    ADL: ADL Overall ADL's : Needs assistance/impaired Eating/Feeding: Independent Grooming: Set up, Sitting Upper Body Bathing: Set up, Sitting Lower Body Bathing: Moderate assistance, Sitting/lateral leans Upper Body Dressing : Set up, Sitting Lower Body Dressing: Moderate assistance, Sitting/lateral leans, Sit to/from stand Toilet Transfer: Moderate assistance, Rolling walker (2 wheels), BSC/3in1 Toileting- Clothing Manipulation and Hygiene: Minimal assistance, Sitting/lateral lean General ADL Comments: Pt mod A for STS, RLE weakness limits balance, not able to lift and move it during transfer without increased time. Pt with good BUE  strength/ROM.  Cognition:  Cognition Overall Cognitive Status: Impaired/Different from baseline Arousal/Alertness: Awake/alert Orientation Level: Oriented X4 Year: 2024 Day of Week: Incorrect Attention: Sustained Sustained Attention: Impaired Sustained Attention Impairment: Verbal basic, Functional basic Memory: Impaired Memory Impairment: Storage deficit, Retrieval deficit Awareness: Impaired Awareness Impairment: Intellectual impairment, Emergent impairment, Anticipatory impairment Problem Solving: Impaired Problem Solving Impairment: Verbal basic, Functional basic Executive Function: Sequencing, Organizing Sequencing: Impaired Sequencing Impairment: Verbal basic, Functional basic Organizing: Impaired Organizing Impairment: Verbal basic, Functional basic Safety/Judgment: Impaired Cognition Arousal: Alert Behavior During Therapy: WFL for tasks assessed/performed Overall Cognitive Status: Impaired/Different from baseline   ROS +mild right sided lower extremity weakness Past Medical History:  Diagnosis Date   CAD (coronary artery disease)    a. s/p MI in 2005 (symptom of indigestion); b. CABG x 3 in 3/09: L-LAD, S-OM, EF unknown   Colon polyps    Diabetes mellitus    DJD (degenerative joint disease)    HLD (hyperlipidemia)    HTN (hypertension)    MI (myocardial infarction) (HCC) 2005   manifested by indigestion   Murmur    echo 6/12:  Upper septal thickening, no LVOT gradient,, EF 65%, mild LAE     Past Surgical History:  Procedure Laterality Date   CHOLECYSTECTOMY  2002   CORONARY ARTERY BYPASS GRAFT  2009   x3   CORONARY/GRAFT ACUTE MI REVASCULARIZATION N/A 07/25/2020   Procedure: Coronary/Graft Acute MI Revascularization;  Surgeon: Jordan, Peter M, MD;  Location: [facility] INVASIVE CV LAB;  Service: Cardiovascular;  Laterality: N/A;   LEFT HEART CATH AND CORS/GRAFTS ANGIOGRAPHY N/A 07/25/2020   Procedure: LEFT HEART CATH AND CORS/GRAFTS ANGIOGRAPHY;  Surgeon: Jordan,  Peter M, MD;  Location: [facility] INVASIVE CV LAB;  Service: Cardiovascular;  Laterality: N/A;   TONSILLECTOMY     TRANSCAROTID ARTERY REVASCULARIZATION  Left 04/21/2024   Procedure: LEFT TRANSCAROTID ARTERY REVASCULARIZATION (TCAR);  Surgeon: [name], MD;  Location: [facility] OR;  Service: Vascular;  Laterality: Left;   Family History  Problem Relation Age of Onset   Colon cancer Mother    Heart attack Mother    High blood pressure Father    Diabetes Father    Social History:  reports that he has quit smoking. His smoking use included cigarettes. He has a 8 pack-year smoking history. He has been exposed to tobacco smoke. He has never used smokeless tobacco. He reports that he does not drink alcohol and does not use drugs. Allergies: Allergies[1] Medications Prior to Admission  Medication Sig Dispense Refill   amLODipine  (NORVASC ) 10 MG tablet Take 0.5 tablets (5 mg total) by mouth daily. 60 tablet 2   aspirin  81 MG tablet Take 81 mg by mouth daily.     atorvastatin  (LIPITOR ) 80 MG tablet Take 1 tablet (80 mg total) by mouth daily at 6 PM. 90 tablet 3   carvedilol  (COREG ) 12.5 MG tablet Take 1 tablet (12.5 mg total) by mouth 2 (two) times daily with a meal. 180 tablet 1   clopidogrel  (PLAVIX ) 75 MG tablet Take 1 tablet (75 mg total) by mouth daily. 90 tablet 1   FLUoxetine  (PROZAC ) 20 MG capsule Take 1 capsule (20 mg total) by mouth daily. 90 capsule 1   metFORMIN  (GLUCOPHAGE -XR) 500 MG 24 hr tablet Take 1 tablet (500 mg total) by mouth daily. 90 tablet 1   nitroGLYCERIN  (NITROSTAT ) 0.4 MG SL tablet Place 1 tablet (0.4 mg total) under the tongue every 5 (five) minutes as needed for chest pain. 25 tablet 1   traZODone (DESYREL) 50 MG  tablet Take 50 mg by mouth at bedtime as needed.     meclizine  (ANTIVERT ) 25 MG tablet Take 1 tablet (25 mg total) by mouth 3 (three) times daily as needed. (Patient not taking: Reported on 04/15/2024) 30 tablet 3     Blood pressure (!) 106/59, pulse 66,  temperature 98 F (36.7 C), temperature source Oral, resp. rate 16, height 5' 10 (1.778 m), weight 77.6 kg, SpO2 97%. Physical Exam Gen: no distress, normal appearing HEENT: oral mucosa pink and moist, NCAT Cardio: Reg rate Chest: normal effort, normal rate of breathing Abd: soft, non-distended Ext: no edema Psych: pleasant, normal affect Skin: intact Neuro: Alert and oriented x3 Musculoskeletal: 5/5 strength throughout except for 4/5 strength in RLE, sensation is intact.  Results for orders placed or performed during the hospital encounter of 04/15/24 (from the past 24 hours)  Glucose, capillary     Status: Abnormal   Collection Time: 04/22/24 11:55 AM  Result Value Ref Range   Glucose-Capillary 229 (H) 70 - 99 mg/dL  Glucose, capillary     Status: Abnormal   Collection Time: 04/22/24  4:47 PM  Result Value Ref Range   Glucose-Capillary 152 (H) 70 - 99 mg/dL  Glucose, capillary     Status: Abnormal   Collection Time: 04/22/24  9:23 PM  Result Value Ref Range   Glucose-Capillary 149 (H) 70 - 99 mg/dL   Comment 1 Notify RN    Comment 2 Document in Chart   Basic metabolic panel with GFR     Status: Abnormal   Collection Time: 04/23/24  4:00 AM  Result Value Ref Range   Sodium 139 135 - 145 mmol/L   Potassium 4.4 3.5 - 5.1 mmol/L   Chloride 108 98 - 111 mmol/L   CO2 23 22 - 32 mmol/L   Glucose, Bld 124 (H) 70 - 99 mg/dL   BUN 22 8 - 23 mg/dL   Creatinine, Ser 8.87 0.61 - 1.24 mg/dL   Calcium  8.5 (L) 8.9 - 10.3 mg/dL   GFR, Estimated >39 >39 mL/min   Anion gap 7 5 - 15  Magnesium      Status: None   Collection Time: 04/23/24  4:00 AM  Result Value Ref Range   Magnesium  1.7 1.7 - 2.4 mg/dL  Phosphorus     Status: None   Collection Time: 04/23/24  4:00 AM  Result Value Ref Range   Phosphorus 2.6 2.5 - 4.6 mg/dL  Glucose, capillary     Status: Abnormal   Collection Time: 04/23/24  6:07 AM  Result Value Ref Range   Glucose-Capillary 123 (H) 70 - 99 mg/dL   Comment 1  Notify RN    Comment 2 Document in Chart   Glucose, capillary     Status: Abnormal   Collection Time: 04/23/24  8:33 AM  Result Value Ref Range   Glucose-Capillary 118 (H) 70 - 99 mg/dL   DG C-Arm 8-39 Min Result Date: 04/21/2024 EXAM: FLUOROSCOPIC IMAGING TECHNIQUE: Fluoroscopy was provided by the radiology department for procedure. Radiologist was not present during examination. RADIATION DOSE INDEX: Reference Air Kerma: 38.5 mGy COMPARISON: None available. CLINICAL HISTORY: LEFT TRANSCAROTID ARTERY REVASCULARIZATION (TCAR) FINDINGS: Intraoperative fluoroscopic imaging was performed. IMPRESSION: 1. Intraoperative fluoroscopic imaging as above. Please refer to the operative report for full details. Electronically signed by: Dayne Hassell MD 04/21/2024 03:53 PM EST RP Workstation: [id number]    Assessment/Plan: Diagnosis: CVA Does the need for close, 24 hr/day medical supervision in concert with the patient's rehab needs  make it unreasonable for this patient to be served in a less intensive setting? 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 Yes Does the patient require coordinated care of a physician, rehab nurse, therapy disciplines of PT, OT to address physical and functional deficits in the context of the above medical diagnosis(es)? Yes Addressing deficits in the following areas: balance, endurance, locomotion, strength, transferring, bowel/bladder control, bathing, dressing, feeding, grooming, toileting,  and psychosocial support Can the patient actively participate in an intensive therapy program of at least 3 hrs of therapy per day at least 5 days per week? Yes The potential for patient to make measurable gains while on inpatient rehab is excellent Anticipated functional outcomes upon discharge from inpatient rehab are modified independent  with PT, modified independent with OT, independent with SLP. Estimated rehab length of stay to reach the above functional goals is: 12-16 days Anticipated discharge destination: Home Overall Rehab/Functional Prognosis: excellent  POST ACUTE RECOMMENDATIONS: This patient's condition is appropriate for continued rehabilitative care in the following setting: CIR Patient has agreed to participate in recommended program. Yes Note that insurance prior authorization may be required for reimbursement for recommended care.     I have personally performed a face to face diagnostic evaluation of this patient. Additionally, I have examined the patient's medical record including any pertinent labs and radiographic images.    Thanks,  Sven SHAUNNA Elks, MD 04/23/2024      [1] No Known Allergies

## 2024-04-23 NOTE — Progress Notes (Addendum)
 PROGRESS NOTE    Christopher Roberts  FMW:[id number] DOB: 07-Oct-1945 DOA: 04/15/2024 PCP: [name], [name], MD  No chief complaint on file.   Brief Narrative:    78 yrs old Male with PMH significant for CAD status post PCI in 2022, diabetes mellitus type 2, pancytopenia following up with Dr. Autumn oncologist and being worked up for possible MDS, hypertension who had Bernetha Anschutz fall today morning when he tried to get up from the bed around 10 AM.  Patient lost balance and fell onto the floor and was on the floor for almost an hour when his family member found him.  Later noticed to have right lower extremity weakness and was brought in the ED.  EMS noted that his right leg was flaccid.   MRI brain shows features concerning for acute punctate stroke. Patient's right lower extremity slowly improving and is able to move at this time and his strength is around 3/ 5.  Neurologist was consulted and recommended stroke workup for acute stroke.  Patient was admitted for further evaluation.  Assessment & Plan:   Principal Problem:   Acute CVA (cerebrovascular accident) [facility]) Active Problems:   Hyperlipidemia   Essential hypertension   Coronary artery disease due to lipid rich plaque   Diabetes mellitus (HCC)   Cirrhosis of liver (HCC)   Carotid stenosis, left  Acute CVA: R punctate MCA/ACA infarcts Patient presented with RLE weakness, MRI shows acute punctate stroke in high R frontal and L parasagittal frontal lobes.   Patient was evaluated by Neurology, L small ACA and R punctate MCA/ACA infarcts, likely due to large vessel disease from bilateral ICA stenosis - recommending DAPT, further regimen per [facility]VS.  Lipitor  80 mg. Dysphagia III diet. CTA Head/ Neck showed right ICA stenosis > 90, left ICA stenosis  60 to 70% 2D echo shows LVEF 60 to 65%, LA moderately dilated. LDL 78 Near goal.  Hb A1c 5.9.  Drug screen negative. PT and OT evaluation.  Aggressive risk factor modification. Vascular surgery consulted  for bilateral carotid artery stenosis.  Awaiting CIR placement.   Bilateral carotid artery stenosis: Vascular surgery is consulted.  Carotid duplex > R ICA 60-79% stenosis, L ICA 40-59% stenosis  Vascular surgery consulted  Now s/p Left TCAR on 04/21/2024.  Continue DAPT, statin.  Needs staged procedure on R.   Essential hypertension , now Hypotension: Given acute stroke , and low BP  hold antihypertensives - currently on midodrine  Goal SBP 130 - 150    History of CAD status post PCI: S/p LHC 07/2020 with unsucessful PCI of SVG to PDA - at that time DAPT recommended Continue antiplatelet and statins, hold beta-blockers.   Urinary Retention Required I/O cath yesterday with 1 L retained 12/13 S/p TOV 12/16, doing well - watch for recurrent retention  Pancytopenia :  Seen by oncology 12/10 - concern for MDS, when deemed safe, it is adviseable to proceed with bone marrow biopsy/aspiration by IR for further evaluation of pancytopenia Scheduled outpatient on 12/26 Heme onc ok with outpatient follow up for bx Transfuse prn to maintain hb >7 and platelets >20,000.   S/p 1 unit pRBC and 1 unit platelets  Goal platelets over 50,000 in case of active bleeding or interventional procedures.  Schistocytes - haptoglobin, LDH pending   History of SVT : He is being followed by cardiologist. HR controlled.   Diabetes mellitus type 2:  A1c 5.6 Ssi for now   Cirrhosis of liver : Imaging suggestive of cirrhosis Bili elevated,  INR mildly elevated, platelets low, albumin  mildly low Needs further outpatient workup    Depression Continue fluoxetine .    DVT prophylaxis: SCD Code Status: full Family Communication: wife at bedside 12/15 Disposition:   Status is: Inpatient Remains inpatient appropriate because: need for continued inpatient care   Consultants:  Neurology vascular  Procedures:  Carotid US   Echo IMPRESSIONS     1. Left ventricular ejection fraction, by estimation, is  60 to 65%. The  left ventricle has normal function. The left ventricle has no regional  wall motion abnormalities. There is mild asymmetric left ventricular  hypertrophy of the basal-septal segment.  Left ventricular diastolic parameters are consistent with Grade I  diastolic dysfunction (impaired relaxation).   2. Right ventricular systolic function is normal. The right ventricular  size is mildly enlarged. There is normal pulmonary artery systolic  pressure. The estimated right ventricular systolic pressure is 16.1 mmHg.   3. Left atrial size was moderately dilated.   4. The mitral valve is degenerative. Mild mitral valve regurgitation. No  evidence of mitral stenosis. Moderate mitral annular calcification.   5. The aortic valve is tricuspid. There is mild calcification of the  aortic valve. There is mild thickening of the aortic valve. Aortic valve  regurgitation is trivial. Aortic valve sclerosis is present, with no  evidence of aortic valve stenosis.   6. The inferior vena cava is normal in size with greater than 50%  respiratory variability, suggesting right atrial pressure of 3 mmHg.   Conclusion(s)/Recommendation(s): No intracardiac source of embolism  detected on this transthoracic study. Consider Felina Tello transesophageal  echocardiogram to exclude cardiac source of embolism if clinically  indicated.   Antimicrobials:  Anti-infectives (From admission, onward)    Start     Dose/Rate Route Frequency Ordered Stop   04/21/24 1800  ceFAZolin  (ANCEF ) IVPB 2g/100 mL premix        2 g 200 mL/hr over 30 Minutes Intravenous Every 8 hours 04/21/24 1704 04/22/24 0308   04/21/24 0000  ceFAZolin  (ANCEF ) IVPB 1 g/50 mL premix       Note to Pharmacy: Send with pt to OR   1 g 100 mL/hr over 30 Minutes Intravenous On call 04/20/24 1019 04/21/24 1050       Subjective: No new complaints  Objective: Vitals:   04/22/24 2000 04/22/24 2319 04/23/24 0409 04/23/24 0716  BP: 91/64 95/61 (!) 98/53  (!) 106/59  Pulse: 64 72 68 66  Resp: 20 19 20 16   Temp: 98.2 F (36.8 C) 98.3 F (36.8 C) 98.4 F (36.9 C) 98 F (36.7 C)  TempSrc: Oral Oral Oral Oral  SpO2: 95% 98% 98% 97%  Weight:      Height:        Intake/Output Summary (Last 24 hours) at 04/23/2024 0836 Last data filed at 04/23/2024 0612 Gross per 24 hour  Intake --  Output 350 ml  Net -350 ml   Filed Weights   04/16/24 1847  Weight: 77.6 kg    Examination:  General: No acute distress. Cardiovascular: RRR Lungs: unlabored Neurological: Alert and oriented 3. 4/5 RLE weakness. Extremities: No clubbing or cyanosis. No edema. Data Reviewed: I have personally reviewed following labs and imaging studies  CBC: Recent Labs  Lab 04/18/24 0225 04/19/24 0841 04/20/24 0638 04/21/24 0404 04/22/24 0308  WBC 2.1* 2.0* 1.9* 2.4* 2.0*  NEUTROABS  --   --   --   --  1.0*  HGB 8.0* 8.2* 8.4* 8.1* 8.4*  HCT 23.3* 23.0* 23.8*  23.4* 23.9*  MCV 88.3 87.5 86.9 87.3 87.9  PLT 42* 47* 49* 49* 50*    Basic Metabolic Panel: Recent Labs  Lab 04/17/24 0259 04/18/24 0225 04/20/24 0638 04/21/24 0404 04/22/24 0308 04/23/24 0400  NA 137 134* 138 139 141 139  K 3.9 3.4* 3.6 3.8 4.1 4.4  CL 107 107 111 109 108 108  CO2 23 21* 18* 22 21* 23  GLUCOSE 148* 122* 111* 105* 204* 124*  BUN 15 18 16 18 21 22   CREATININE 1.16 1.22 1.13 1.14 1.26* 1.12  CALCIUM  8.1* 7.4* 8.3* 8.1* 8.4* 8.5*  MG 1.6* 1.9 1.6*  --  1.9 1.7  PHOS 3.2 3.1 3.2  --  2.7 2.6    GFR: Estimated Creatinine Clearance: 55.2 mL/min (by C-G formula based on SCr of 1.12 mg/dL).  Liver Function Tests: Recent Labs  Lab 04/22/24 0308  AST 35  ALT 21  ALKPHOS 92  BILITOT 1.3*  PROT 6.0*  ALBUMIN  2.6*    CBG: Recent Labs  Lab 04/22/24 1155 04/22/24 1647 04/22/24 2123 04/23/24 0607 04/23/24 0833  GLUCAP 229* 152* 149* 123* 118*     Recent Results (from the past 240 hours)  Surgical pcr screen     Status: None   Collection Time: 04/20/24   1:54 PM   Specimen: Nasal Mucosa; Nasal Swab  Result Value Ref Range Status   MRSA, PCR NEGATIVE NEGATIVE Final   Staphylococcus aureus NEGATIVE NEGATIVE Final    Comment: (NOTE) The Xpert SA Assay (FDA approved for NASAL specimens in patients 48 years of age and older), is one component of Cristofer Yaffe comprehensive surveillance program. It is not intended to diagnose infection nor to guide or monitor treatment. Performed at [facility], [address] [place], [place] [address]          Radiology Studies: DG C-Arm 1-60 Min Result Date: 04/21/2024 EXAM: FLUOROSCOPIC IMAGING TECHNIQUE: Fluoroscopy was provided by the radiology department for procedure. Radiologist was not present during examination. RADIATION DOSE INDEX: Reference Air Kerma: 38.5 mGy COMPARISON: None available. CLINICAL HISTORY: LEFT TRANSCAROTID ARTERY REVASCULARIZATION (TCAR) FINDINGS: Intraoperative fluoroscopic imaging was performed. IMPRESSION: 1. Intraoperative fluoroscopic imaging as above. Please refer to the operative report for full details. Electronically signed by: Dayne Hassell MD 04/21/2024 03:53 PM EST RP Workstation: [id number]        Scheduled Meds:  aspirin   81 mg Oral Daily   atorvastatin   80 mg Oral q1800   Chlorhexidine  Gluconate Cloth  6 each Topical Daily   clopidogrel   75 mg Oral Daily   docusate sodium   100 mg Oral Daily   feeding supplement  237 mL Oral BID BM   FLUoxetine   20 mg Oral Daily   insulin  aspart  0-5 Units Subcutaneous QHS   insulin  aspart  0-9 Units Subcutaneous TID WC   midodrine   10 mg Oral TID WC   sodium chloride  flush  3 mL Intravenous Q12H   tamsulosin   0.4 mg Oral QPC supper   Continuous Infusions:  sodium chloride         LOS: 7 days    Time spent: over 30 min     Meliton Monte, MD [facility]   To contact the attending provider between 7A-7P or the covering provider during after hours 7P-7A, please log into the web site [email or web address] and  access using universal [place] password for that web site. If you do not have the password, please call the hospital operator.  04/23/2024, 8:36 AM

## 2024-04-23 NOTE — Progress Notes (Addendum)
°  Progress Note    04/23/2024 8:06 AM 2 Days Post-Op  Subjective:  no complaints   Vitals:   04/23/24 0409 04/23/24 0716  BP: (!) 98/53 (!) 106/59  Pulse: 68 66  Resp: 20 16  Temp: 98.4 F (36.9 C) 98 F (36.7 C)  SpO2: 98% 97%   Physical Exam: Cardiac:  regular Lungs:  non labored Incisions:  left neck incision intact and well appearing Extremities:  moving extremities without deficits Neurologic: alert and oriented. Speech coherent  CBC    Component Value Date/Time   WBC 2.0 (L) 04/22/2024 0308   RBC 2.72 (L) 04/22/2024 0308   HGB 8.4 (L) 04/22/2024 0308   HGB 11.2 (L) 03/17/2024 1215   HGB 10.8 (L) 03/12/2024 1459   HCT 23.9 (L) 04/22/2024 0308   HCT 32.5 (L) 03/12/2024 1459   PLT 50 (L) 04/22/2024 0308   PLT 58 (L) 03/17/2024 1215   PLT 63 (LL) 03/12/2024 1459   MCV 87.9 04/22/2024 0308   MCV 90 03/12/2024 1459   MCH 30.9 04/22/2024 0308   MCHC 35.1 04/22/2024 0308   RDW 18.8 (H) 04/22/2024 0308   RDW 18.5 (H) 03/12/2024 1459   LYMPHSABS 0.7 04/22/2024 0308   LYMPHSABS 1.1 03/12/2024 1459   MONOABS 0.1 04/22/2024 0308   EOSABS 0.0 04/22/2024 0308   EOSABS 0.0 03/12/2024 1459   BASOSABS 0.0 04/22/2024 0308   BASOSABS 0.0 03/12/2024 1459    BMET    Component Value Date/Time   NA 139 04/23/2024 0400   NA 141 03/12/2024 1459   K 4.4 04/23/2024 0400   CL 108 04/23/2024 0400   CO2 23 04/23/2024 0400   GLUCOSE 124 (H) 04/23/2024 0400   BUN 22 04/23/2024 0400   BUN 19 03/12/2024 1459   CREATININE 1.12 04/23/2024 0400   CREATININE 1.07 03/17/2024 1215   CALCIUM  8.5 (L) 04/23/2024 0400   GFRNONAA >60 04/23/2024 0400   GFRNONAA >60 03/17/2024 1215   GFRAA >60 08/31/2016 0443    INR    Component Value Date/Time   INR 1.3 (H) 04/15/2024 1558     Intake/Output Summary (Last 24 hours) at 04/23/2024 0806 Last data filed at 04/23/2024 0612 Gross per 24 hour  Intake --  Output 350 ml  Net -350 ml     Assessment/Plan:  78 y.o. male is s/p L  TCAR 2 Days Post-Op   Left neck incision is clean, dry and intact and healing well Neurologically intact Pancytopenia. To follow up outpatient Will need Aspirin  and Plavix  Follow up in 1 month (1/27) already arranged with Dr. Gretta Teretha Damme, PA-C [facility] [phone] 04/23/2024 8:06 AM  I have seen and evaluated the patient. I agree with the PA note as documented above.  At his neurologic baseline now postop day 2 status post left TCAR.  Plan aspirin  Plavix  statin at discharge.  Have follow-up arranged with me to discuss intervention on the contralateral right side.  Lonni DOROTHA Gretta, MD [facility] [place] Office: [phone]

## 2024-04-23 NOTE — Progress Notes (Addendum)
 STROKE TEAM PROGRESS NOTE   SUBJECTIVE (INTERVAL HISTORY)  Pt was lying comfortably in bed this morning. He no complaints.  Neurologic exam is again unchanged. Vital signs are stable.  Therapies recommend inpatient rehab  SUBJECTIVE Temp:  [98 F (36.7 C)-98.4 F (36.9 C)] 98 F (36.7 C) (12/17 1113) Pulse Rate:  [64-120] 120 (12/17 1113) Cardiac Rhythm: Normal sinus rhythm (12/17 0700) Resp:  [15-20] 18 (12/17 1113) BP: (91-141)/(53-80) 141/80 (12/17 1113) SpO2:  [95 %-99 %] 97 % (12/17 1113)  Recent Labs  Lab 04/22/24 1647 04/22/24 2123 04/23/24 0607 04/23/24 0833 04/23/24 1058  GLUCAP 152* 149* 123* 118* 108*   Recent Labs  Lab 04/17/24 0259 04/18/24 0225 04/20/24 0638 04/21/24 0404 04/22/24 0308 04/23/24 0400  NA 137 134* 138 139 141 139  K 3.9 3.4* 3.6 3.8 4.1 4.4  CL 107 107 111 109 108 108  CO2 23 21* 18* 22 21* 23  GLUCOSE 148* 122* 111* 105* 204* 124*  BUN 15 18 16 18 21 22   CREATININE 1.16 1.22 1.13 1.14 1.26* 1.12  CALCIUM  8.1* 7.4* 8.3* 8.1* 8.4* 8.5*  MG 1.6* 1.9 1.6*  --  1.9 1.7  PHOS 3.2 3.1 3.2  --  2.7 2.6   Recent Labs  Lab 04/22/24 0308  AST 35  ALT 21  ALKPHOS 92  BILITOT 1.3*  PROT 6.0*  ALBUMIN  2.6*   Recent Labs  Lab 04/19/24 0841 04/20/24 0638 04/21/24 0404 04/22/24 0308 04/23/24 0924  WBC 2.0* 1.9* 2.4* 2.0* 2.5*  NEUTROABS  --   --   --  1.0* 1.4*  HGB 8.2* 8.4* 8.1* 8.4* 9.0*  HCT 23.0* 23.8* 23.4* 23.9* 26.4*  MCV 87.5 86.9 87.3 87.9 90.7  PLT 47* 49* 49* 50* 53*   No results for input(s): CKTOTAL, CKMB, CKMBINDEX, TROPONINI in the last 168 hours.  No results for input(s): LABPROT, INR in the last 72 hours.  No results for input(s): COLORURINE, LABSPEC, PHURINE, GLUCOSEU, HGBUR, BILIRUBINUR, KETONESUR, PROTEINUR, UROBILINOGEN, NITRITE, LEUKOCYTESUR in the last 72 hours.  Invalid input(s): APPERANCEUR     Component Value Date/Time   CHOL 98 04/22/2024 0308   TRIG 46  04/22/2024 0308   HDL 25 (L) 04/22/2024 0308   CHOLHDL 3.9 04/22/2024 0308   VLDL 9 04/22/2024 0308   LDLCALC 64 04/22/2024 0308   Lab Results  Component Value Date   HGBA1C 5.6 04/16/2024      Component Value Date/Time   LABOPIA NONE DETECTED 04/15/2024 1611   COCAINSCRNUR NONE DETECTED 04/15/2024 1611   LABBENZ NONE DETECTED 04/15/2024 1611   AMPHETMU NONE DETECTED 04/15/2024 1611   THCU NONE DETECTED 04/15/2024 1611   LABBARB NONE DETECTED 04/15/2024 1611    No results for input(s): ETH in the last 168 hours.   I have personally reviewed the radiological images below and agree with the radiology interpretations.  DG C-Arm 1-60 Min Result Date: 04/21/2024 EXAM: FLUOROSCOPIC IMAGING TECHNIQUE: Fluoroscopy was provided by the radiology department for procedure. Radiologist was not present during examination. RADIATION DOSE INDEX: Reference Air Kerma: 38.5 mGy COMPARISON: None available. CLINICAL HISTORY: LEFT TRANSCAROTID ARTERY REVASCULARIZATION (TCAR) FINDINGS: Intraoperative fluoroscopic imaging was performed. IMPRESSION: 1. Intraoperative fluoroscopic imaging as above. Please refer to the operative report for full details. Electronically signed by: Dayne Hassell MD 04/21/2024 03:53 PM EST RP Workstation: [id number]   VAS US  CAROTID Result Date: 04/19/2024 Carotid Arterial Duplex Study Patient Name:  Christopher Roberts  Date of Exam:   04/17/2024 Medical Rec #: [id number]  Accession #:    [id number] Date of Birth: Sep 05, 1945       Patient Gender: M Patient Age:   36 years Exam Location:  [facility] Procedure:      VAS US  CAROTID Referring Phys: LUCIE RHYNE --------------------------------------------------------------------------------  Indications:       CVA and Carotid artery disease. Risk Factors:      Hypertension, hyperlipidemia, Diabetes, past history of                    smoking, prior MI, coronary artery disease. Comparison Study:  No previous carotid duplex  exams. CTA on 04/26/2024 showed RT                    ICA >90% & LT ICA 60-70% Performing Technologist: Jody Hill RVT, RDMS  Examination Guidelines: A complete evaluation includes B-mode imaging, spectral Doppler, color Doppler, and power Doppler as needed of all accessible portions of each vessel. Bilateral testing is considered an integral part of a complete examination. Limited examinations for reoccurring indications may be performed as noted.  Right Carotid Findings: +----------+--------+--------+--------+---------------------+------------------+           PSV cm/sEDV cm/sStenosisPlaque Description   Comments           +----------+--------+--------+--------+---------------------+------------------+ CCA Prox  85      13                                                      +----------+--------+--------+--------+---------------------+------------------+ CCA Mid                           heterogenous                            +----------+--------+--------+--------+---------------------+------------------+ CCA Distal98      17              heterogenous         intimal thickening +----------+--------+--------+--------+---------------------+------------------+ ICA Prox  314     91      60-79%  calcific, diffuse and                                                     irregular                               +----------+--------+--------+--------+---------------------+------------------+ ICA Mid   252     58                                                      +----------+--------+--------+--------+---------------------+------------------+ ICA Distal83      23                                                      +----------+--------+--------+--------+---------------------+------------------+  ECA       129     0               calcific                                +----------+--------+--------+--------+---------------------+------------------+  +----------+--------+-------+----------------+-------------------+           PSV cm/sEDV cmsDescribe        Arm Pressure (mmHG) +----------+--------+-------+----------------+-------------------+ Subclavian203            Multiphasic, WNL                    +----------+--------+-------+----------------+-------------------+ +---------+--------+--+--------+--+---------+ VertebralPSV cm/s64EDV cm/s13Antegrade +---------+--------+--+--------+--+---------+  Left Carotid Findings: +----------+--------+--------+--------+-------------------------------+--------+           PSV cm/sEDV cm/sStenosisPlaque Description             Comments +----------+--------+--------+--------+-------------------------------+--------+ CCA Prox  106     20                                                      +----------+--------+--------+--------+-------------------------------+--------+ CCA Mid                           calcific and heterogenous               +----------+--------+--------+--------+-------------------------------+--------+ CCA Distal141     31                                                      +----------+--------+--------+--------+-------------------------------+--------+ ICA Prox  191     56      40-59%  calcific, irregular and diffuse         +----------+--------+--------+--------+-------------------------------+--------+ ICA Mid   124     39                                                      +----------+--------+--------+--------+-------------------------------+--------+ ICA Distal97      29                                                      +----------+--------+--------+--------+-------------------------------+--------+ ECA       80                      calcific and heterogenous               +----------+--------+--------+--------+-------------------------------+--------+ +----------+--------+--------+----------------+-------------------+            PSV cm/sEDV cm/sDescribe        Arm Pressure (mmHG) +----------+--------+--------+----------------+-------------------+ [id number]             Multiphasic, WNL                    +----------+--------+--------+----------------+-------------------+ +---------+--------+--+--------+--+---------+ VertebralPSV cm/s51EDV cm/s17Antegrade +---------+--------+--+--------+--+---------+   Summary: Right  Carotid: Velocities in the right ICA are consistent with a 60-79% stenosis                (upper end of range). Left Carotid: Velocities in the left ICA are consistent with a 40-59% stenosis               (upper end of range). Vertebrals:  Bilateral vertebral arteries demonstrate antegrade flow. Subclavians: Normal flow hemodynamics were seen in bilateral subclavian              arteries. *See table(s) above for measurements and observations.  Electronically signed by Eather Popp MD on 04/19/2024 at 11:29:45 AM.    Final    ECHOCARDIOGRAM COMPLETE Result Date: 04/16/2024    ECHOCARDIOGRAM REPORT   Patient Name:   Christopher Roberts Date of Exam: 04/16/2024 Medical Rec #:  [id number]      Height:       69.0 in Accession #:    [id number]     Weight:       172.4 lb Date of Birth:  November 04, 1945      BSA:          1.940 m Patient Age:    78 years       BP:           138/67 mmHg Patient Gender: M              HR:           74 bpm. Exam Location:  Inpatient Procedure: 2D Echo, Cardiac Doppler and Color Doppler (Both Spectral and Color            Flow Doppler were utilized during procedure). Indications:    Stroke 163.9  History:        Patient has prior history of Echocardiogram examinations, most                 recent 07/26/2020. CAD and Previous Myocardial Infarction,                 Stroke, Signs/Symptoms:Chest Pain and Shortness of Breath; Risk                 Factors:Dyslipidemia, Hypertension, Diabetes and Former Smoker.  Sonographer:    Merlynn Argyle Referring Phys: [phone] REDIA LOISE CLEAVER  Sonographer  Comments: Image acquisition challenging due to respiratory motion. IMPRESSIONS  1. Left ventricular ejection fraction, by estimation, is 60 to 65%. The left ventricle has normal function. The left ventricle has no regional wall motion abnormalities. There is mild asymmetric left ventricular hypertrophy of the basal-septal segment. Left ventricular diastolic parameters are consistent with Grade I diastolic dysfunction (impaired relaxation).  2. Right ventricular systolic function is normal. The right ventricular size is mildly enlarged. There is normal pulmonary artery systolic pressure. The estimated right ventricular systolic pressure is 16.1 mmHg.  3. Left atrial size was moderately dilated.  4. The mitral valve is degenerative. Mild mitral valve regurgitation. No evidence of mitral stenosis. Moderate mitral annular calcification.  5. The aortic valve is tricuspid. There is mild calcification of the aortic valve. There is mild thickening of the aortic valve. Aortic valve regurgitation is trivial. Aortic valve sclerosis is present, with no evidence of aortic valve stenosis.  6. The inferior vena cava is normal in size with greater than 50% respiratory variability, suggesting right atrial pressure of 3 mmHg. Conclusion(s)/Recommendation(s): No intracardiac source of embolism detected on this transthoracic study. Consider a transesophageal echocardiogram to exclude cardiac source of  embolism if clinically indicated. FINDINGS  Left Ventricle: Left ventricular ejection fraction, by estimation, is 60 to 65%. The left ventricle has normal function. The left ventricle has no regional wall motion abnormalities. The left ventricular internal cavity size was normal in size. There is  mild asymmetric left ventricular hypertrophy of the basal-septal segment. Left ventricular diastolic parameters are consistent with Grade I diastolic dysfunction (impaired relaxation). Right Ventricle: The right ventricular size is mildly  enlarged. No increase in right ventricular wall thickness. Right ventricular systolic function is normal. There is normal pulmonary artery systolic pressure. The tricuspid regurgitant velocity is 1.81  m/s, and with an assumed right atrial pressure of 3 mmHg, the estimated right ventricular systolic pressure is 16.1 mmHg. Left Atrium: Left atrial size was moderately dilated. Right Atrium: Right atrial size was normal in size. Pericardium: There is no evidence of pericardial effusion. Mitral Valve: The mitral valve is degenerative in appearance. There is mild thickening of the mitral valve leaflet(s). There is mild calcification of the mitral valve leaflet(s). Moderate mitral annular calcification. Mild mitral valve regurgitation. No evidence of mitral valve stenosis. Tricuspid Valve: The tricuspid valve is normal in structure. Tricuspid valve regurgitation is trivial. No evidence of tricuspid stenosis. Aortic Valve: The aortic valve is tricuspid. There is mild calcification of the aortic valve. There is mild thickening of the aortic valve. Aortic valve regurgitation is trivial. Aortic valve sclerosis is present, with no evidence of aortic valve stenosis. Pulmonic Valve: The pulmonic valve was normal in structure. Pulmonic valve regurgitation is trivial. No evidence of pulmonic stenosis. Aorta: The aortic root is normal in size and structure. Venous: The inferior vena cava is normal in size with greater than 50% respiratory variability, suggesting right atrial pressure of 3 mmHg. IAS/Shunts: No atrial level shunt detected by color flow Doppler.  LEFT VENTRICLE PLAX 2D LVIDd:         4.60 cm   Diastology LVIDs:         3.40 cm   LV e' medial:    4.66 cm/s LV PW:         1.00 cm   LV E/e' medial:  19.7 LV IVS:        1.30 cm   LV e' lateral:   15.50 cm/s LVOT diam:     2.20 cm   LV E/e' lateral: 5.9 LV SV:         108 LV SV Index:   56 LVOT Area:     3.80 cm  RIGHT VENTRICLE RV Basal diam:  4.30 cm RV Mid diam:     3.50 cm RV S prime:     21.80 cm/s TAPSE (M-mode): 1.5 cm LEFT ATRIUM             Index        RIGHT ATRIUM           Index LA diam:        4.00 cm 2.06 cm/m   RA Area:     19.00 cm LA Vol (A2C):   71.7 ml 36.97 ml/m  RA Volume:   51.60 ml  26.60 ml/m LA Vol (A4C):   96.7 ml 49.85 ml/m LA Biplane Vol: 87.0 ml 44.85 ml/m  AORTIC VALVE LVOT Vmax:   127.00 cm/s LVOT Vmean:  94.800 cm/s LVOT VTI:    0.285 m  AORTA Ao Root diam: 3.70 cm Ao Asc diam:  3.70 cm MITRAL VALVE  TRICUSPID VALVE MV Area (PHT): 2.55 cm    TR Peak grad:   13.1 mmHg MV Decel Time: 297 msec    TR Vmax:        181.00 cm/s MV E velocity: 92.00 cm/s MV A velocity: 90.30 cm/s  SHUNTS MV E/A ratio:  1.02        Systemic VTI:  0.29 m                            Systemic Diam: 2.20 cm Oneil Parchment MD Electronically signed by Oneil Parchment MD Signature Date/Time: 04/16/2024/11:50:06 AM    Final    CT ANGIO HEAD NECK W WO CM Result Date: 04/16/2024 EXAM: CTA HEAD AND NECK WITHOUT AND WITH 04/16/2024 05:41:14 AM TECHNIQUE: CTA of the head and neck was performed without and with the administration of 75 mL of iohexol  (OMNIPAQUE ) 350 MG/ML injection. Multiplanar 2D and/or 3D reformatted images are provided for review. Automated exposure control, iterative reconstruction, and/or weight based adjustment of the mA/kV was utilized to reduce the radiation dose to as low as reasonably achievable. Stenosis of the internal carotid arteries measured using NASCET criteria. COMPARISON: CT of the head dated 04/15/2024 CLINICAL HISTORY: Stroke/TIA, determine embolic source. FINDINGS: CTA NECK: AORTIC ARCH AND ARCH VESSELS: No dissection or arterial injury. No significant stenosis of the brachiocephalic or subclavian arteries. There is calcific atheromatous disease within the aortic arch. CERVICAL CAROTID ARTERIES: No dissection or arterial injury. There is moderate calcific plaque within the carotid bulbs and origins of the internal carotid arteries  bilaterally. There is near occlusive stenosis of the proximal right internal carotid artery with estimated stenosis 90% or greater. There is extensive calcific plaque within the proximal left internal carotid artery with estimated stenosis approximately 60 to 70%. CERVICAL VERTEBRAL ARTERIES: No dissection, arterial injury, or significant stenosis. LUNGS AND MEDIASTINUM: Unremarkable. SOFT TISSUES: No acute abnormality. BONES: No acute abnormality. CTA HEAD: ANTERIOR CIRCULATION: There is moderately advanced calcific atheromatous disease within the carotid siphons. There is moderate stenosis of the cavernous and supraclinoid segments, approximately 40 to 50% bilaterally. No significant stenosis of the anterior cerebral arteries. No significant stenosis of the middle cerebral arteries. No aneurysm. POSTERIOR CIRCULATION: There is moderate-to-severe stenosis of the P2 segment of the left posterior cerebral artery and there is moderate stenosis of the P2 segment of the right posterior cerebral artery. The focal stenoses are demonstrated on images 104 and 106 of series 9, respectively. No significant stenosis of the basilar artery. There is moderately advanced calcific atheromatous disease within the vertebral arteries. No aneurysm. OTHER: There is age-related atrophy and moderately advanced cerebral white matter disease. Chronic encephalomalacia changes are noted within the frontal and parietal lobes bilaterally from remote cortical infarcts. Chronic infarcts are also noted within the cerebellar hemispheres bilaterally. No dural venous sinus thrombosis on this non-dedicated study. IMPRESSION: 1. 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 2. Extensive calcific plaque within the proximal left internal carotid artery with estimated stenosis approximately 60 to 70%. 3. Moderate-to-severe stenosis of the P2 segment of the left posterior cerebral artery and  moderate stenosis of the P2 segment of the right posterior cerebral artery. 4. Moderate stenosis of the cavernous and supraclinoid segments, approximately 40 to 50% bilaterally. Electronically signed by: Evalene Coho MD 04/16/2024 06:14 AM EST RP Workstation: [id number]   MR BRAIN WO CONTRAST Result Date: 04/15/2024 EXAM: MRI BRAIN WITHOUT CONTRAST 04/15/2024  07:46:35 PM TECHNIQUE: Multiplanar multisequence MRI of the head/brain was performed without the administration of intravenous contrast. COMPARISON: CT head from earlier today. MRI head 08/30/2016. CLINICAL HISTORY: Neuro deficit, acute, stroke suspected FINDINGS: BRAIN AND VENTRICLES: Punctate acute infarcts in the high right frontal and left parasagittal frontal lobes (series 2, images 39 and 37). No intracranial hemorrhage. No mass. No midline shift. No hydrocephalus. Advanced T2 hyperintensity in the white matter, compatible with chronic avascular ischemic disease. Remote lacunar infarcts in the corona radiata and bilateral parietal cortex. Multiple remote cerebellar infarcts bilaterally. Normal flow voids. ORBITS: No acute abnormality. SINUSES AND MASTOIDS: No acute abnormality. BONES AND SOFT TISSUES: Normal marrow signal. No acute soft tissue abnormality. IMPRESSION: 1. Punctate acute infarcts in the high right frontal and left parasagittal frontal lobes. 2. 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 Electronically signed by: Gilmore Molt MD 04/15/2024 09:43 PM EST RP Workstation: [id number]   CT HEAD WO CONTRAST Result Date: 04/15/2024 CLINICAL DATA:  Clemens out of bed, neurologic deficit EXAM: CT HEAD WITHOUT CONTRAST TECHNIQUE: Contiguous axial images were obtained from the base of the skull through the vertex without intravenous contrast. RADIATION DOSE REDUCTION: This exam was performed according to the departmental dose-optimization program which includes automated exposure control, adjustment  of the mA and/or kV according to patient size and/or use of iterative reconstruction technique. COMPARISON:  08/31/2016 FINDINGS: Brain: There are chronic cortical infarcts along the bilateral frontal and parietal convexities, as well as within the bilateral cerebellar hemispheres. Chronic small vessel ischemic changes are seen throughout the periventricular and subcortical white matter. No evidence of acute infarct or hemorrhage. Dense bilateral basal ganglia calcifications are again noted. Lateral ventricles and midline structures are otherwise unremarkable. No acute extra-axial fluid collections. No mass effect. Vascular: No hyperdense vessel or unexpected calcification. Skull: Normal. Negative for fracture or focal lesion. Sinuses/Orbits: No acute finding. Other: None. IMPRESSION: 1. No acute intracranial process. 2. Chronic ischemic changes as above. Electronically Signed   By: Ozell Daring M.D.   On: 04/15/2024 17:50     PHYSICAL EXAM  Temp:  [98 F (36.7 C)-98.4 F (36.9 C)] 98 F (36.7 C) (12/17 1113) Pulse Rate:  [64-120] 120 (12/17 1113) Resp:  [15-20] 18 (12/17 1113) BP: (91-141)/(53-80) 141/80 (12/17 1113) SpO2:  [95 %-99 %] 97 % (12/17 1113)  General - Well nourished, well developed, in no apparent distress.  Mental Status -  Level of arousal and orientation to time, place, and person were intact. Language including expression, naming, repetition, comprehension was assessed and found intact. Attention span and concentration were normal. Recent and remote memory were intact. Fund of Knowledge was assessed and was intact.  Cranial Nerves II - XII - II - Visual field intact OU. III, IV, VI - Extraocular movements intact. V - Facial sensation intact bilaterally. VII - Facial movement intact bilaterally. VIII - Hearing & vestibular intact bilaterally. X - Palate elevates symmetrically. XI - Chin turning & shoulder shrug intact bilaterally. XII - Tongue protrusion  intact.  Motor Strength - L arm and leg 5/5. R arm 5/5. R hip flexion 4/5, knee extension 5/5, dorsi and plantar flexion 4/5. Bulk was normal and fasciculations were absent. Overall improved significantly from yesterday.  Sensory - Light touch were assessed and were symmetrical.    Coordination -   Tremor was absent.  Gait and Station - deferred.   ASSESSMENT/PLAN Mr. EDDIE PAYETTE is a 78 y.o. male with history of CAD s/p CABG,  DM, DJD, HLD, HTN admitted for Acute R sided weakness. Imaging shows R ICA >90% stenosis, L ICA 60-70% stenosis, with likely L ICA related symptoms. Need to keep him from hypotension. VSS planning on TCAR on Monday 04/21/2024. Heme/Onc consulted for pancytopenia: recommended platelets >50k for procedure and Hgb >7.0 and to transfuse as needed.    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 2D Echo: EF 60-65%, LA moderately dilated, RA normal in size CUS R ICA 60-79% stenosis, L ICA 40-59% stenosis LDL 78 HgbA1c 5.9 UDS negative aspirin  81 mg daily and clopidogrel  75 mg daily prior to admission, now on aspirin  81 mg daily and clopidogrel  75 mg daily.  Patient counseled to be compliant with his antithrombotic medications Ongoing aggressive stroke risk factor management Therapy recommendations: CIR Disposition: Pending  Fluctuating right-sided weakness 12/10 Reported by EMS patient was flaccid on right side, improved in the ER.  However this morning had worsening weakness on right side again when BP on the low end Likely due to flow-limiting stenosis of bilateral ICAs Off IV fluid Encourage p.o. intake Avoid low BP perioperatively  ICA stenosis CTA: R ICA Stenosis  >90%, L ICA Stenosis 60-70% CUS R ICA 60-79% stenosis, L ICA 40-59% stenosis Although right stenosis more significant, the left stenosis was more symptomatic Vascular surgery consulted, L TCAR performed 12/15, appreciate Dr. Gaylene assistance  Pancytopenia WBC 2.5 Hemoglobin 9.0 Platelet 53 Initial plan for bone marrow biopsy next week to rule out MDS Oncology consulted, recommended platelets >50k for procedure and Hgb >7.0 and to transfuse as needed.  Hypertension Home meds including Coreg  BP 120s now Hold off Coreg  for now Avoid low BP BP goal 140-160 before carotid revascularization  Hyperlipidemia Home meds:  Lipitor  80mg  daily LDL 78, goal < 70 Now on Lipitor  80mg  daily Continue statin at discharge  Other Stroke Risk Factors Advanced age Coronary artery disease supposed CABG  Other medical issues AKI on CKD, creatinine 1.13-1.27-1.16 Depression on Prozac   Hospital day # 7  Patient has remained neurologically stable but still has mild right-sided weakness.  Continue mild permissive hypertension with systolic 130-150 range.  Continue dual antiplatelet therapy. Neurology will sign off. Please reach out if there are any further concerns.       CHARM Penne Mori, DO, PGY1 Neurology Stroke Team  04/23/2024 3:34 PM  I have personally obtained history,examined this patient, reviewed notes, independently viewed imaging studies, participated in medical decision making and plan of care.ROS completed by me personally and pertinent positives fully documented  I have made any additions or clarifications directly to the above note. Agree with note above.  Patient is doing well following TCAR procedure.  Continue dual antiplatelet therapy for large vessel stenosis and aggressive risk factor modification.  Follow-up as an outpatient stroke clinic with nurse practitioner in 2 months.  Stroke team will sign off.  Kindly call for questions.  Eather Popp, MD Medical Director [facility] Pager: [phone] 04/23/2024 3:34 PM    To contact Stroke Continuity provider, please refer to [email or web address]. After hours, contact General Neurology

## 2024-04-23 NOTE — Progress Notes (Signed)
 Physical Therapy Treatment Patient Details Name: Christopher Roberts MRN: [id number] DOB: [birth year] Today's Date: 04/23/2024   History of Present Illness 78 y.o. male admitted 04/15/24 s/p fall with RLE weakness. CTA Head/Neck showed right ICA stenosis >90, left ICA stenosis 60-70%. MRI demonstrated acute punctate CVA. Underwent L TCAR 12/15. PMHx: CAD s/p PCI 2022, T2DM, pancytopenia, HTN, DJD, HLD, MI, and CABGx3. Oncologist worked up for possible MDS.    PT Comments  Pt resting in bed on arrival, pleasant and agreeable to session and demonstrating steady progress towards acute goals. Pt continues to be limited by R hemi-weakness with decreased motor control LE>UE and decreased attention with pt requiring cues and hands on assist for mobilization and for increased attention. Pt requiring grossly min A to complete bed mobility and for transfers sit<>stand from EOB and recliner and min-mod A to maintain balance to progress forward gait with RW for support.  Pt up in chair at end of session with all needs met. Pt continues to benefit from skilled PT services to progress toward functional mobility goals.     If plan is discharge home, recommend the following: A lot of help with walking and/or transfers;Assistance with cooking/housework;A little help with bathing/dressing/bathroom;Assist for transportation;Help with stairs or ramp for entrance   Can travel by private vehicle        Equipment Recommendations  Wheelchair (measurements PT);Wheelchair cushion (measurements PT);BSC/3in1    Recommendations for Other Services       Precautions / Restrictions Precautions Precautions: Fall Recall of Precautions/Restrictions: Impaired Restrictions Weight Bearing Restrictions Per Provider Order: No     Mobility  Bed Mobility Overal bed mobility: Needs Assistance Bed Mobility: Supine to Sit     Supine to sit: Min assist     General bed mobility comments: pt able to self mobilize RLE to and off EOB  by hooking LLE under with cues. Pt neednig cues to scoot out fully to EOB and for increased attention to RLE    Transfers Overall transfer level: Needs assistance Equipment used: Rolling walker (2 wheels) Transfers: Sit to/from Stand, Bed to chair/wheelchair/BSC Sit to Stand: Min assist           General transfer comment: cues for optimal hand placement and min a to reise from EOB and recliner.    Ambulation/Gait Ambulation/Gait assistance: Mod assist Gait Distance (Feet): 5 Feet Assistive device: Rolling walker (2 wheels) Gait Pattern/deviations: Step-to pattern, Decreased weight shift to right, Decreased stance time - right, Decreased dorsiflexion - right       General Gait Details: pt able to progress short ambualtion away from EOB with cues and hands on assist for RLE placement as RLE crossing midline. Increased R lateral lean with fatigue   Stairs             Wheelchair Mobility     Tilt Bed    Modified Rankin (Stroke Patients Only) Modified Rankin (Stroke Patients Only) Pre-Morbid Rankin Score: No symptoms Modified Rankin: Moderately severe disability     Balance Overall balance assessment: Needs assistance Sitting-balance support: No upper extremity supported, Feet supported Sitting balance-Leahy Scale: Poor Sitting balance - Comments: initially leaning with mod to min A for balance, able to sit with S When holding footboard with L hand   Standing balance support: Bilateral upper extremity supported Standing balance-Leahy Scale: Poor Standing balance comment: UE support and min A for balance standing  Communication Communication Communication: No apparent difficulties  Cognition Arousal: Alert Behavior During Therapy: WFL for tasks assessed/performed   PT - Cognitive impairments: Problem solving, Attention                       PT - Cognition Comments: some diminished attention to R LE, slow  processing Following commands: Intact      Cueing Cueing Techniques: Verbal cues, Gestural cues  Exercises General Exercises - Lower Extremity Long Arc Quad: AAROM, Right, 10 reps, Seated Hip Flexion/Marching: AAROM, Right, 10 reps, Seated    General Comments General comments (skin integrity, edema, etc.): VSS on RA      Pertinent Vitals/Pain Pain Assessment Pain Assessment: Faces Faces Pain Scale: Hurts a little bit Pain Location: neck post TCAR Pain Descriptors / Indicators: Sore Pain Intervention(s): Monitored during session, Limited activity within patient's tolerance    Home Living                          Prior Function            PT Goals (current goals can now be found in the care plan section) Acute Rehab PT Goals Patient Stated Goal: Get stronger. PT Goal Formulation: With patient/family Time For Goal Achievement: 05/01/24 Progress towards PT goals: Progressing toward goals    Frequency    Min 3X/week      PT Plan      Co-evaluation              AM-PAC PT 6 Clicks Mobility   Outcome Measure  Help needed turning from your back to your side while in a flat bed without using bedrails?: A Little Help needed moving from lying on your back to sitting on the side of a flat bed without using bedrails?: A Little Help needed moving to and from a bed to a chair (including a wheelchair)?: A Lot Help needed standing up from a chair using your arms (e.g., wheelchair or bedside chair)?: A Little Help needed to walk in hospital room?: Total Help needed climbing 3-5 steps with a railing? : Total 6 Click Score: 13    End of Session Equipment Utilized During Treatment: Gait belt Activity Tolerance: Patient tolerated treatment well;Patient limited by fatigue Patient left: in chair;with call bell/phone within reach;with chair alarm set Nurse Communication: Mobility status PT Visit Diagnosis: Other abnormalities of gait and mobility  (R26.89);Hemiplegia and hemiparesis;Other symptoms and signs involving the nervous system (R29.898) Hemiplegia - Right/Left: Right Hemiplegia - dominant/non-dominant: Dominant Hemiplegia - caused by: Cerebral infarction     Time: 1201-1220 PT Time Calculation (min) (ACUTE ONLY): 19 min  Charges:    $Therapeutic Activity: 8-22 mins PT General Charges $$ ACUTE PT VISIT: 1 Visit                     Elih Mooney R. PTA Acute Rehabilitation Services Office: [phone]   Therisa CHRISTELLA Boor 04/23/2024, 1:04 PM

## 2024-04-24 ENCOUNTER — Other Ambulatory Visit: Payer: Self-pay

## 2024-04-24 ENCOUNTER — Encounter (HOSPITAL_COMMUNITY): Payer: Self-pay | Admitting: Physical Medicine & Rehabilitation

## 2024-04-24 ENCOUNTER — Inpatient Hospital Stay (HOSPITAL_COMMUNITY)
Admission: AD | Admit: 2024-04-24 | Discharge: 2024-05-14 | DRG: 057 | Disposition: A | Discharge status: Home Health | Source: Intra-hospital | Attending: Physical Medicine & Rehabilitation | Admitting: Physical Medicine & Rehabilitation

## 2024-04-24 DIAGNOSIS — Z7984 Long term (current) use of oral hypoglycemic drugs: Secondary | ICD-10-CM | POA: Diagnosis not present

## 2024-04-24 DIAGNOSIS — Z8249 Family history of ischemic heart disease and other diseases of the circulatory system: Secondary | ICD-10-CM | POA: Diagnosis not present

## 2024-04-24 DIAGNOSIS — B964 Proteus (mirabilis) (morganii) as the cause of diseases classified elsewhere: Secondary | ICD-10-CM | POA: Diagnosis not present

## 2024-04-24 DIAGNOSIS — E785 Hyperlipidemia, unspecified: Secondary | ICD-10-CM | POA: Diagnosis present

## 2024-04-24 DIAGNOSIS — I69320 Aphasia following cerebral infarction: Secondary | ICD-10-CM

## 2024-04-24 DIAGNOSIS — Z9049 Acquired absence of other specified parts of digestive tract: Secondary | ICD-10-CM | POA: Diagnosis not present

## 2024-04-24 DIAGNOSIS — I69341 Monoplegia of lower limb following cerebral infarction affecting right dominant side: Principal | ICD-10-CM

## 2024-04-24 DIAGNOSIS — Z7982 Long term (current) use of aspirin: Secondary | ICD-10-CM | POA: Diagnosis not present

## 2024-04-24 DIAGNOSIS — Z79899 Other long term (current) drug therapy: Secondary | ICD-10-CM | POA: Diagnosis not present

## 2024-04-24 DIAGNOSIS — R739 Hyperglycemia, unspecified: Secondary | ICD-10-CM | POA: Diagnosis not present

## 2024-04-24 DIAGNOSIS — I252 Old myocardial infarction: Secondary | ICD-10-CM | POA: Diagnosis not present

## 2024-04-24 DIAGNOSIS — I6523 Occlusion and stenosis of bilateral carotid arteries: Secondary | ICD-10-CM | POA: Diagnosis present

## 2024-04-24 DIAGNOSIS — E119 Type 2 diabetes mellitus without complications: Secondary | ICD-10-CM | POA: Diagnosis present

## 2024-04-24 DIAGNOSIS — F32A Depression, unspecified: Secondary | ICD-10-CM | POA: Diagnosis present

## 2024-04-24 DIAGNOSIS — Z5989 Other problems related to housing and economic circumstances: Secondary | ICD-10-CM

## 2024-04-24 DIAGNOSIS — K5900 Constipation, unspecified: Secondary | ICD-10-CM | POA: Diagnosis present

## 2024-04-24 DIAGNOSIS — Z833 Family history of diabetes mellitus: Secondary | ICD-10-CM | POA: Diagnosis not present

## 2024-04-24 DIAGNOSIS — D61818 Other pancytopenia: Secondary | ICD-10-CM | POA: Diagnosis present

## 2024-04-24 DIAGNOSIS — R072 Precordial pain: Secondary | ICD-10-CM | POA: Diagnosis not present

## 2024-04-24 DIAGNOSIS — R4701 Aphasia: Secondary | ICD-10-CM

## 2024-04-24 DIAGNOSIS — I951 Orthostatic hypotension: Secondary | ICD-10-CM | POA: Diagnosis present

## 2024-04-24 DIAGNOSIS — Z7902 Long term (current) use of antithrombotics/antiplatelets: Secondary | ICD-10-CM | POA: Diagnosis not present

## 2024-04-24 DIAGNOSIS — R339 Retention of urine, unspecified: Secondary | ICD-10-CM | POA: Diagnosis not present

## 2024-04-24 DIAGNOSIS — I63239 Cerebral infarction due to unspecified occlusion or stenosis of unspecified carotid arteries: Secondary | ICD-10-CM | POA: Diagnosis not present

## 2024-04-24 DIAGNOSIS — I63512 Cerebral infarction due to unspecified occlusion or stenosis of left middle cerebral artery: Secondary | ICD-10-CM | POA: Diagnosis not present

## 2024-04-24 DIAGNOSIS — I959 Hypotension, unspecified: Secondary | ICD-10-CM | POA: Diagnosis not present

## 2024-04-24 DIAGNOSIS — Z5941 Food insecurity: Secondary | ICD-10-CM

## 2024-04-24 DIAGNOSIS — I2510 Atherosclerotic heart disease of native coronary artery without angina pectoris: Secondary | ICD-10-CM | POA: Diagnosis not present

## 2024-04-24 DIAGNOSIS — R509 Fever, unspecified: Secondary | ICD-10-CM | POA: Diagnosis not present

## 2024-04-24 DIAGNOSIS — F17210 Nicotine dependence, cigarettes, uncomplicated: Secondary | ICD-10-CM | POA: Diagnosis present

## 2024-04-24 DIAGNOSIS — K7460 Unspecified cirrhosis of liver: Secondary | ICD-10-CM | POA: Diagnosis present

## 2024-04-24 DIAGNOSIS — I25119 Atherosclerotic heart disease of native coronary artery with unspecified angina pectoris: Secondary | ICD-10-CM | POA: Diagnosis not present

## 2024-04-24 DIAGNOSIS — I10 Essential (primary) hypertension: Secondary | ICD-10-CM | POA: Diagnosis present

## 2024-04-24 DIAGNOSIS — W06XXXA Fall from bed, initial encounter: Secondary | ICD-10-CM | POA: Diagnosis not present

## 2024-04-24 DIAGNOSIS — Z951 Presence of aortocoronary bypass graft: Secondary | ICD-10-CM

## 2024-04-24 DIAGNOSIS — E7800 Pure hypercholesterolemia, unspecified: Secondary | ICD-10-CM | POA: Diagnosis not present

## 2024-04-24 DIAGNOSIS — Z23 Encounter for immunization: Secondary | ICD-10-CM | POA: Diagnosis present

## 2024-04-24 DIAGNOSIS — N390 Urinary tract infection, site not specified: Secondary | ICD-10-CM | POA: Diagnosis not present

## 2024-04-24 DIAGNOSIS — K5901 Slow transit constipation: Secondary | ICD-10-CM | POA: Diagnosis not present

## 2024-04-24 DIAGNOSIS — I639 Cerebral infarction, unspecified: Secondary | ICD-10-CM | POA: Diagnosis not present

## 2024-04-24 DIAGNOSIS — Y92009 Unspecified place in unspecified non-institutional (private) residence as the place of occurrence of the external cause: Secondary | ICD-10-CM | POA: Diagnosis not present

## 2024-04-24 LAB — CBC WITH DIFFERENTIAL/PLATELET
Abs Immature Granulocytes: 0.15 K/uL — ABNORMAL HIGH (ref 0.00–0.07)
Basophils Absolute: 0 K/uL (ref 0.0–0.1)
Basophils Relative: 1 %
Eosinophils Absolute: 0 K/uL (ref 0.0–0.5)
Eosinophils Relative: 2 %
HCT: 24.9 % — ABNORMAL LOW (ref 39.0–52.0)
Hemoglobin: 8.5 g/dL — ABNORMAL LOW (ref 13.0–17.0)
Immature Granulocytes: 7 %
Lymphocytes Relative: 48 %
Lymphs Abs: 1 K/uL (ref 0.7–4.0)
MCH: 30.6 pg (ref 26.0–34.0)
MCHC: 34.1 g/dL (ref 30.0–36.0)
MCV: 89.6 fL (ref 80.0–100.0)
Monocytes Absolute: 0.2 K/uL (ref 0.1–1.0)
Monocytes Relative: 10 %
Neutro Abs: 0.7 K/uL — ABNORMAL LOW (ref 1.7–7.7)
Neutrophils Relative %: 32 %
Platelets: 57 K/uL — ABNORMAL LOW (ref 150–400)
RBC: 2.78 MIL/uL — ABNORMAL LOW (ref 4.22–5.81)
RDW: 18.5 % — ABNORMAL HIGH (ref 11.5–15.5)
Smear Review: NORMAL
WBC: 2.1 K/uL — ABNORMAL LOW (ref 4.0–10.5)
nRBC: 1 % — ABNORMAL HIGH (ref 0.0–0.2)

## 2024-04-24 LAB — GLUCOSE, CAPILLARY
Glucose-Capillary: 107 mg/dL — ABNORMAL HIGH (ref 70–99)
Glucose-Capillary: 170 mg/dL — ABNORMAL HIGH (ref 70–99)
Glucose-Capillary: 181 mg/dL — ABNORMAL HIGH (ref 70–99)
Glucose-Capillary: 175 mg/dL — ABNORMAL HIGH (ref 70–99)
Glucose-Capillary: 119 mg/dL — ABNORMAL HIGH (ref 70–99)

## 2024-04-24 LAB — BASIC METABOLIC PANEL WITH GFR
Anion gap: 7 (ref 5–15)
BUN: 20 mg/dL (ref 8–23)
CO2: 24 mmol/L (ref 22–32)
Calcium: 8.4 mg/dL — ABNORMAL LOW (ref 8.9–10.3)
Chloride: 107 mmol/L (ref 98–111)
Creatinine, Ser: 0.97 mg/dL (ref 0.61–1.24)
GFR, Estimated: >60 mL/min (ref >60)
Glucose, Bld: 115 mg/dL — ABNORMAL HIGH (ref 70–99)
Potassium: 4 mmol/L (ref 3.5–5.1)
Sodium: 138 mmol/L (ref 135–145)

## 2024-04-24 LAB — MAGNESIUM: Magnesium: 2 mg/dL (ref 1.7–2.4)

## 2024-04-24 LAB — POCT ACTIVATED CLOTTING TIME: Activated Clotting Time: 230 s

## 2024-04-24 MED ORDER — ACETAMINOPHEN 160 MG/5ML PO SOLN: 650.0000 mg | ORAL | Status: DC | PRN

## 2024-04-24 MED ORDER — TAMSULOSIN HCL 0.4 MG PO CAPS
0.4000 mg | ORAL_CAPSULE | Freq: Every day | ORAL | Status: DC
  Administered 2024-04-24 – 2024-04-25 (×2): 0.4 mg via ORAL
  Filled 2024-04-24 (×2): qty 1

## 2024-04-24 MED ORDER — TAMSULOSIN HCL 0.4 MG PO CAPS: 0.4000 mg | ORAL_CAPSULE | Freq: Every day | ORAL | Status: DC

## 2024-04-24 MED ORDER — ENSURE PLUS HIGH PROTEIN PO LIQD
237.0000 mL | Freq: Two times a day (BID) | ORAL | Status: DC
  Administered 2024-04-25 – 2024-05-14 (×31): 237 mL via ORAL

## 2024-04-24 MED ORDER — ATORVASTATIN CALCIUM 80 MG PO TABS
80.0000 mg | ORAL_TABLET | Freq: Every day | ORAL | Status: DC
  Administered 2024-04-24 – 2024-05-13 (×20): 80 mg via ORAL
  Filled 2024-04-24 (×13): qty 1

## 2024-04-24 MED ORDER — MIDODRINE HCL 10 MG PO TABS: 10.0000 mg | ORAL_TABLET | Freq: Three times a day (TID) | ORAL | Status: DC

## 2024-04-24 MED ORDER — CLOPIDOGREL BISULFATE 75 MG PO TABS
75.0000 mg | ORAL_TABLET | Freq: Every day | ORAL | Status: DC
  Administered 2024-04-25 – 2024-05-14 (×20): 75 mg via ORAL
  Filled 2024-04-24 (×13): qty 1

## 2024-04-24 MED ORDER — ORAL CARE MOUTH RINSE: 15.0000 mL | OROMUCOSAL | Status: DC | PRN

## 2024-04-24 MED ORDER — INSULIN ASPART 100 UNIT/ML IJ SOLN
0.0000 [IU] | Freq: Three times a day (TID) | INTRAMUSCULAR | Status: DC
  Administered 2024-04-24: 18:00:00 2 [IU] via SUBCUTANEOUS
  Administered 2024-04-25: 1 [IU] via SUBCUTANEOUS
  Administered 2024-04-25: 3 [IU] via SUBCUTANEOUS
  Administered 2024-04-26 (×2): 1 [IU] via SUBCUTANEOUS
  Administered 2024-04-26: 2 [IU] via SUBCUTANEOUS
  Administered 2024-04-27: 1 [IU] via SUBCUTANEOUS
  Administered 2024-04-27: 2 [IU] via SUBCUTANEOUS
  Administered 2024-04-27 – 2024-04-29 (×5): 1 [IU] via SUBCUTANEOUS
  Administered 2024-04-30: 2 [IU] via SUBCUTANEOUS
  Administered 2024-04-30: 1 [IU] via SUBCUTANEOUS
  Administered 2024-05-01: 2 [IU] via SUBCUTANEOUS
  Administered 2024-05-01: 1 [IU] via SUBCUTANEOUS
  Administered 2024-05-02 – 2024-05-03 (×2): 2 [IU] via SUBCUTANEOUS
  Administered 2024-05-03 – 2024-05-04 (×3): 1 [IU] via SUBCUTANEOUS
  Administered 2024-05-04: 5 [IU] via SUBCUTANEOUS
  Administered 2024-05-05: 2 [IU] via SUBCUTANEOUS
  Administered 2024-05-05: 1 [IU] via SUBCUTANEOUS
  Administered 2024-05-05: 2 [IU] via SUBCUTANEOUS
  Administered 2024-05-06: 1 [IU] via SUBCUTANEOUS
  Administered 2024-05-06 – 2024-05-07 (×3): 2 [IU] via SUBCUTANEOUS
  Administered 2024-05-07: 3 [IU] via SUBCUTANEOUS
  Administered 2024-05-07: 2 [IU] via SUBCUTANEOUS
  Administered 2024-05-08: 5 [IU] via SUBCUTANEOUS
  Administered 2024-05-08: 1 [IU] via SUBCUTANEOUS
  Administered 2024-05-08: 2 [IU] via SUBCUTANEOUS
  Administered 2024-05-09: 5 [IU] via SUBCUTANEOUS
  Administered 2024-05-09: 2 [IU] via SUBCUTANEOUS
  Administered 2024-05-09 – 2024-05-10 (×2): 1 [IU] via SUBCUTANEOUS
  Administered 2024-05-10: 3 [IU] via SUBCUTANEOUS
  Administered 2024-05-10: 2 [IU] via SUBCUTANEOUS
  Administered 2024-05-11 (×2): 1 [IU] via SUBCUTANEOUS
  Administered 2024-05-12: 2 [IU] via SUBCUTANEOUS
  Administered 2024-05-12: 1 [IU] via SUBCUTANEOUS
  Administered 2024-05-12: 2 [IU] via SUBCUTANEOUS
  Administered 2024-05-13: 1 [IU] via SUBCUTANEOUS
  Administered 2024-05-13 (×2): 2 [IU] via SUBCUTANEOUS
  Filled 2024-04-24 (×2): qty 2
  Filled 2024-04-24: qty 1
  Filled 2024-04-24 (×2): qty 2
  Filled 2024-04-24 (×3): qty 1
  Filled 2024-04-24: qty 5
  Filled 2024-04-24 (×2): qty 2
  Filled 2024-04-24: qty 1
  Filled 2024-04-24: qty 3
  Filled 2024-04-24 (×2): qty 1
  Filled 2024-04-24: qty 3
  Filled 2024-04-24: qty 2
  Filled 2024-04-24 (×2): qty 1
  Filled 2024-04-24: qty 2
  Filled 2024-04-24 (×3): qty 1
  Filled 2024-04-24: qty 2
  Filled 2024-04-24 (×2): qty 1

## 2024-04-24 MED ORDER — DOCUSATE SODIUM 100 MG PO CAPS
100.0000 mg | ORAL_CAPSULE | Freq: Every day | ORAL | Status: DC
  Administered 2024-04-25 – 2024-05-14 (×20): 100 mg via ORAL
  Filled 2024-04-24 (×13): qty 1

## 2024-04-24 MED ORDER — ACETAMINOPHEN 650 MG RE SUPP: 650.0000 mg | RECTAL | Status: DC | PRN

## 2024-04-24 MED ORDER — ASPIRIN 81 MG PO CHEW
81.0000 mg | CHEWABLE_TABLET | Freq: Every day | ORAL | Status: DC
  Administered 2024-04-25 – 2024-05-14 (×20): 81 mg via ORAL
  Filled 2024-04-24 (×13): qty 1

## 2024-04-24 MED ORDER — MIDODRINE HCL 5 MG PO TABS
10.0000 mg | ORAL_TABLET | Freq: Three times a day (TID) | ORAL | Status: DC
  Administered 2024-04-24 – 2024-05-07 (×36): 10 mg via ORAL
  Filled 2024-04-24 (×30): qty 2

## 2024-04-24 MED ORDER — FLUOXETINE HCL 20 MG PO CAPS
20.0000 mg | ORAL_CAPSULE | Freq: Every day | ORAL | Status: DC
  Administered 2024-04-25 – 2024-05-14 (×20): 20 mg via ORAL
  Filled 2024-04-24 (×13): qty 1

## 2024-04-24 MED ORDER — ACETAMINOPHEN 325 MG PO TABS: 650.0000 mg | ORAL_TABLET | ORAL | Status: DC | PRN

## 2024-04-24 NOTE — Progress Notes (Signed)
 Christopher Joesph BROCKS, DO  Physician Physical Medicine and Rehabilitation   PMR Pre-admission     Signed   Date of Service: 04/24/2024  1:35 PM  Related encounter: ED to Hosp-Admission (Current) from 04/15/2024 in [facility] 4E CV SURGICAL PROGRESSIVE CARE   Signed     Expand All Collapse All  Show:Clear all [x] Written[x] Templated[x] Copied  Added by: [x] Alison Heron MATSU, RN[x] Christopher Joesph BROCKS, DO  [] Hover for details PMR Admission Coordinator Pre-Admission Assessment   Patient: Christopher Roberts is an 78 y.o., male MRN: [id number] DOB: October 26, 1945 Height: 5' 10 (177.8 cm) Weight: 77.6 kg   Insurance Information HMO:     PPO:      PCP:      IPA:      80/20:      OTHER: POS plan PRIMARY: UHC Medicare      Policy#: [id number]; Medicare [id number]      Subscriber: pt CM Name: Eleanor      Phone#: [phone] option 3     Fax#: [phone] Pre-Cert#: [id number] Auth for CIR from Melissa with [facility] for [facility] medicare for admit 12/18 with next review date 12/24.  Updates due to general fax listed above.        Employer:  Benefits:  Phone #: [phone]     Name: 12/17 Eff. Date: 05/09/23 active     Deduct: none      Out of Pocket Max: $4900      Life Max: none CIR: $325 co pay per day days 1 until 5      SNF: no co pay per day days 1 until 20; $203 co pay per day days 21 until; 100 Outpatient: $20 per visit     Co-Pay:  Home Health: 100%      Co-Pay: none DME: 80%     Co-Pay: 20% Providers: in network  SECONDARY: none      Policy#:      Phone#:    Artist:       Phone#:    The Best Boy for patients in Inpatient Rehabilitation Facilities with attached Privacy Act Statement-Health Care Records was provided and verbally reviewed with: Patient and Family   Emergency Contact Information Contact Information       Name Relation Home Work Mobile    [place] Spouse     [phone]         Other Contacts   None on  File      Current Medical History  Patient Admitting Diagnosis: CVA   History of Present Illness: Christopher Roberts is a 78 year old right-handed male with history significant for diabetes mellitus, hypertension, hyperlipidemia, 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, tobacco use, pancytopenia followed by oncology [name] and being worked up for possible MDS.   Presented 04/15/2024 after a fall when he tried to get up from bed around 10 AM.  He lost his balance and fell onto the floor and laid there for approxi-1 hour before being found by family member.  Family reported some right lower extremity weakness.     CT/MRI showed punctate acute infarct in the high right frontal and left parasagittal frontal lobes.  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  CTA showed near occlusive stenosis of the proximal right internal carotid artery with estimated stenosis 90% or greater.  Extensive calcified plaque within the proximal left internal carotid artery with estimated stenosis approximately 60 to 70%.  Moderate to severe stenosis of the P2  segment of the left posterior cerebral artery and moderate stenosis of the P2 segment of the right posterior cerebral artery.  Admission chemistries unremarkable except hemoglobin 10.3 RBCs 3.38, WBC 2.1, platelets 52,000, chloride 112, CO2 21, glucose 126, urine drug screen negative, hemoglobin A1c 5.6, total bilirubin 1.4-1.9, INR 1.3.  Echocardiogram with ejection fraction of 60 to 65% no wall motion abnormalities.     Follow-up vascular surgery Dr. Lonni Gaskins for symptomatic left ICA stenosis 60 to 70% underwent trans catheter placement of left cervical carotid artery stenting angioplasty 04/21/2024.  Patient currently maintained on aspirin  81 mg daily and Plavix  75 mg daily for CVA prophylaxis.  Hospital course with bouts of hypotension currently on ProAmatine  and beta-blocker held.  Patient did have 1 episode of urinary  retention requiring an In-N-Out catheterization with 1 L retained 12/13 without recurrence and placed on Flomax .  Findings of mildly elevated bilirubin imaging suggestive of mild cirrhosis plan outpatient workup.    Complete NIHSS TOTAL: 0   Patient's medical record from [facility] has been reviewed by the rehabilitation admission coordinator and physician.   Past Medical History      Past Medical History:  Diagnosis Date   CAD (coronary artery disease)      a. s/p MI in 2005 (symptom of indigestion); b. CABG x 3 in 3/09: L-LAD, S-OM, EF unknown   Colon polyps     Diabetes mellitus     DJD (degenerative joint disease)     HLD (hyperlipidemia)     HTN (hypertension)     MI (myocardial infarction) (HCC) 2005    manifested by indigestion   Murmur      echo 6/12:  Upper septal thickening, no LVOT gradient,, EF 65%, mild LAE          Has the patient had major surgery during 100 days prior to admission? Yes   Family History   family history includes Colon cancer in his mother; Diabetes in his father; Heart attack in his mother; High blood pressure in his father.   Current Medications [Current Medications]  [Current Medications]    Current Facility-Administered Medications:    0.9 %  sodium chloride  infusion, 500 mL, Intravenous, Once PRN, Baglia, Corrina, PA-C   acetaminophen  (TYLENOL ) tablet 650 mg, 650 mg, Oral, Q4H PRN **OR** acetaminophen  (TYLENOL ) 160 MG/5ML solution 650 mg, 650 mg, Per Tube, Q4H PRN **OR** acetaminophen  (TYLENOL ) suppository 650 mg, 650 mg, Rectal, Q4H PRN, Baglia, Corrina, PA-C   aspirin  chewable tablet 81 mg, 81 mg, Oral, Daily, Baglia, Corrina, PA-C, 81 mg at 04/24/24 9161   atorvastatin  (LIPITOR ) tablet 80 mg, 80 mg, Oral, q1800, Baglia, Corrina, PA-C, 80 mg at 04/23/24 1712   Chlorhexidine  Gluconate Cloth 2 % PADS 6 each, 6 each, Topical, Daily, Baglia, Corrina, PA-C, 6 each at 04/20/24 1842   clopidogrel  (PLAVIX ) tablet 75 mg, 75 mg, Oral,  Daily, Baglia, Corrina, PA-C, 75 mg at 04/24/24 9161   docusate sodium  (COLACE) capsule 100 mg, 100 mg, Oral, Daily, Baglia, Corrina, PA-C, 100 mg at 04/24/24 9161   feeding supplement (ENSURE PLUS HIGH PROTEIN) liquid 237 mL, 237 mL, Oral, BID BM, Baglia, Corrina, PA-C, 237 mL at 04/23/24 1600   FLUoxetine  (PROZAC ) capsule 20 mg, 20 mg, Oral, Daily, Baglia, Corrina, PA-C, 20 mg at 04/24/24 9161   insulin  aspart (novoLOG ) injection 0-5 Units, 0-5 Units, Subcutaneous, QHS, Baglia, Corrina, PA-C, 3 Units at 04/21/24 2156   insulin  aspart (novoLOG ) injection 0-9 Units, 0-9 Units, Subcutaneous, TID WC, Baglia,  Corrina, PA-C, 2 Units at 04/24/24 1206   midodrine  (PROAMATINE ) tablet 10 mg, 10 mg, Oral, TID WC, Baglia, Corrina, PA-C, 10 mg at 04/24/24 1206   Oral care mouth rinse, 15 mL, Mouth Rinse, PRN, Baglia, Corrina, PA-C   sodium chloride  flush (NS) 0.9 % injection 3 mL, 3 mL, Intravenous, Q12H, Baglia, Corrina, PA-C, 3 mL at 04/24/24 0841   sodium chloride  flush (NS) 0.9 % injection 3 mL, 3 mL, Intravenous, PRN, Baglia, Corrina, PA-C   tamsulosin  (FLOMAX ) capsule 0.4 mg, 0.4 mg, Oral, QPC supper, Baglia, Corrina, PA-C, 0.4 mg at 04/23/24 1712    Patients Current Diet:  Diet Order                  DIET DYS 3 Room service appropriate? Yes with Assist; Fluid consistency: Thin  Diet effective now                       Precautions / Restrictions Precautions Precautions: Fall Precaution/Restrictions Comments: BP goal 140-160 before carotid revascularization Restrictions Weight Bearing Restrictions Per Provider Order: No    Has the patient had 2 or more falls or a fall with injury in the past year? Yes   Prior Activity Level Limited Community (1-2x/wk): mod I with cane and sometimes RW   Prior Functional Level Self Care: Did the patient need help bathing, dressing, using the toilet or eating? Independent   Indoor Mobility: Did the patient need assistance with walking from room to  room (with or without device)? Independent   Stairs: Did the patient need assistance with internal or external stairs (with or without device)? Independent   Functional Cognition: Did the patient need help planning regular tasks such as shopping or remembering to take medications? Independent   Patient Information Are you of Hispanic, Latino/a,or Spanish origin?: A. No, not of Hispanic, Latino/a, or Spanish origin What is your race?: B. Black or African American Do you need or want an interpreter to communicate with a doctor or health care staff?: 0. No   Patient's Response To:  Health Literacy and Transportation Is the patient able to respond to health literacy and transportation needs?: Yes Health Literacy - How often do you need to have someone help you when you read instructions, pamphlets, or other written material from your doctor or pharmacy?: Sometimes In the past 12 months, has lack of transportation kept you from medical appointments or from getting medications?: No In the past 12 months, has lack of transportation kept you from meetings, work, or from getting things needed for daily living?: No   Home Assistive Devices / Equipment Home Equipment: [employer] (2 wheels), [employer] - single point, Rollator (4 wheels), Shower seat - built in, [employer] held shower head   Prior Device Use: Indicate devices/aids used by the patient prior to current illness, exacerbation or injury? Cane and RW   Current Functional Level Cognition   Arousal/Alertness: Awake/alert Overall Cognitive Status: Impaired/Different from baseline Orientation Level: Oriented X4 Attention: Sustained Sustained Attention: Impaired Sustained Attention Impairment: Verbal basic, Functional basic Memory: Impaired Memory Impairment: Storage deficit, Retrieval deficit Awareness: Impaired Awareness Impairment: Intellectual impairment, Emergent impairment, Anticipatory impairment Problem Solving: Impaired Problem Solving  Impairment: Verbal basic, Functional basic Executive Function: Sequencing, Organizing Sequencing: Impaired Sequencing Impairment: Verbal basic, Functional basic Organizing: Impaired Organizing Impairment: Verbal basic, Functional basic Safety/Judgment: Impaired    Extremity Assessment (includes Sensation/Coordination)   Upper Extremity Assessment: Overall WFL for tasks assessed  Lower Extremity Assessment: Defer to  PT evaluation RLE Deficits / Details: Hip strength 3+/5. Knee and Ankle strength 4/5. RLE Sensation: WNL RLE Coordination: decreased gross motor (Pt with difficulty performing heel-to-shin test and tapping toes for RAMs)     ADLs   Overall ADL's : Needs assistance/impaired Eating/Feeding: Independent Grooming: Set up, Sitting Upper Body Bathing: Set up, Sitting Lower Body Bathing: Moderate assistance, Sitting/lateral leans Upper Body Dressing : Set up, Sitting Lower Body Dressing: Minimal assistance, Sit to/from stand Lower Body Dressing Details (indicate cue type and reason): pt  able to manage socks, min assist to stand Toilet Transfer: Minimal assistance, Ambulation, Rolling walker (2 wheels) Toilet Transfer Details (indicate cue type and reason): Increased distance min fading to CGA to manage RLE Toileting- Clothing Manipulation and Hygiene: Minimal assistance, Sit to/from stand Functional mobility during ADLs: Minimal assistance, Rolling walker (2 wheels) General ADL Comments: Min assist to stand. Reviewed hemi dressing techniques     Mobility   Overal bed mobility: Needs Assistance Bed Mobility: Sit to Supine Supine to sit: Min assist Sit to supine: Supervision General bed mobility comments: pt up in chair on arrival and at end of session     Transfers   Overall transfer level: Needs assistance Equipment used: Rolling walker (2 wheels) Transfers: Sit to/from Stand, Bed to chair/wheelchair/BSC Sit to Stand: Min assist, Contact guard assist Bed to/from  chair/wheelchair/BSC transfer type:: Step pivot Step pivot transfers: Mod assist General transfer comment: pt with good recall for hand placement with time, grossly CGA to stand from recliner chair x2 up to RW. min A to maintain balance with serial sit<>stands x7 at end of session with no RW for support     Ambulation / Gait / Stairs / Wheelchair Mobility   Ambulation/Gait Ambulation/Gait assistance: Mod assist, +2 safety/equipment (chair follow) Gait Distance (Feet): 40 Feet (+ 20') Assistive device: Rolling walker (2 wheels) Gait Pattern/deviations: Step-to pattern, Decreased weight shift to right, Decreased stance time - right, Decreased dorsiflexion - right General Gait Details: mod A to maintain balance, pt ambualting with R knee flexed in stance and with narrow BOS, pt able to widen BOS however then externally rotating RLE, with fatigue R knee flexion in stance increasing, chair follow and x1 seated rest Gait velocity: decr Pre-gait activities: Pt engaged in standing weight shifts inside RW with minA. He c/o worsening dizziness. Instructed pt to return to sitting EOB.     Posture / Balance Dynamic Sitting Balance Sitting balance - Comments: sitting up on edge of chair Balance Overall balance assessment: Needs assistance Sitting-balance support: No upper extremity supported, Feet supported Sitting balance-Leahy Scale: Fair Sitting balance - Comments: sitting up on edge of chair Standing balance support: Bilateral upper extremity supported Standing balance-Leahy Scale: Poor Standing balance comment: Reliant on UE support     Special considerations/life events       Previous Home Environment  Living Arrangements: Spouse/significant other, Other (Comment) (raising grandchildren)  Lives With: Spouse, Family Available Help at Discharge: Family, Available 24 hours/day Type of Home: House Home Layout: Two level, Bed/bath upstairs, 1/2 bath on main level Alternate Level Stairs-Rails:  Left Alternate Level Stairs-Number of Steps: 12 Home Access: Level entry Bathroom Shower/Tub: Health Visitor: Standard Bathroom Accessibility: Yes How Accessible: Accessible via walker Home Care Services: No Additional Comments: Pt lives with wife who is able to assist as needed. Pt has granddaughters who live there too who can help 5 grandchildren in the home, 6, 25 and 3 under 4 years of age   Discharge  Living Setting Plans for Discharge Living Setting: Patient's home, House, Lives with (comment) (wife and grandchildren) Type of Home at Discharge: House Discharge Home Layout: 1/2 bath on main level, Two level, Bed/bath upstairs Discharge Home Access: Level entry Discharge Bathroom Shower/Tub: Walk-in shower Discharge Bathroom Toilet: Standard Discharge Bathroom Accessibility: Yes How Accessible: Accessible via walker Does the patient have any problems obtaining your medications?: No   Social/Family/Support Systems Patient Roles: Spouse, Caregiver Contact Information: wife Anticipated Caregiver: wife and family Anticipated Caregiver's Contact Information: see contacts Ability/Limitations of Caregiver: no limitations Caregiver Availability: 24/7 Discharge Plan Discussed with Primary Caregiver: Yes Is Caregiver In Agreement with Plan?: Yes Does Caregiver/Family have Issues with Lodging/Transportation while Pt is in Rehab?: No   Goals Patient/Family Goal for Rehab: supervision PT, OT and SLP Expected length of stay: ELOS 10 to 14 days Pt/Family Agrees to Admission and willing to participate: Yes Program Orientation Provided & Reviewed with Pt/Caregiver Including Roles  & Responsibilities: Yes   Decrease burden of Care through IP rehab admission: n/a   Possible need for SNF placement upon discharge: not anticipated   Patient Condition: This patient's condition remains as documented in the consult dated 04/23/24, in which the Rehabilitation Physician  determined and documented that the patient's condition is appropriate for intensive rehabilitative care in an inpatient rehabilitation facility. Will admit to inpatient rehab today.   Preadmission Screen Completed By:  Alison Heron Lot, 04/24/2024 1:35 PM ______________________________________________________________________   Discussed status with Dr. Emeline on 04/24/24 at 1406 and received approval for admission today.   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 Yes Co-Morbidities requiring supervision/potential complications: Carotid artery stenosis status post angioplasty 12-15, hypertension, diabetes, coronary artery disease, pancytopenia, liver cirrhosis, urinary retention, constipation Due to bladder management, bowel management, safety, skin/wound care, disease management, medication administration, pain management, and patient education, does the patient require 24 hr/day rehab nursing? Yes Does the patient require coordinated care of a physician, rehab nurse, PT, OT, and SLP to address physical and functional deficits in the context of the above medical diagnosis(es)? Yes Addressing deficits in the following areas: balance, endurance, locomotion, strength, transferring, bowel/bladder control, bathing, dressing, feeding, grooming, toileting, cognition, speech, language, dysphagia, and psychosocial support Can the patient actively participate in an intensive therapy program of at least 3 hrs of therapy 5 days a week? Yes The potential for patient to make measurable gains while on inpatient rehab is good Anticipated functional outcomes upon discharge from inpatient rehab:  supervision PT, supervision OT, supervision SLP Estimated rehab length of stay to reach the above functional goals is: 10-14 days Anticipated discharge destination: Home 10. Overall Rehab/Functional Prognosis: good     MD Signature:   Joesph JAYSON Emeline, DO 04/24/2024              Revision History  Date/Time User Provider Type Action  04/24/2024  2:14 PM Christopher Joesph JAYSON, DO Physician Sign  04/24/2024  2:07 PM Alison Heron MATSU, RN Rehab Admission Coordinator Share  04/24/2024  1:35 PM Alison Heron MATSU, RN Rehab Admission Coordinator Share   View Details Report

## 2024-04-24 NOTE — Progress Notes (Incomplete)
 Inpatient Rehabilitation Admission Medication Review by a Pharmacist  A complete drug regimen review was completed for this patient to identify any potential clinically significant medication issues.  High Risk Drug Classes Is patient taking? Indication by Medication  Antipsychotic No   Anticoagulant No   Antibiotic No   Opioid No   Antiplatelet Yes Asa/plavix -cva ppx  Hypoglycemics/insulin  Yes Insulin - DM  Vasoactive Medication Yes Midodrine - hypotension Flomax - BPH  Chemotherapy No   Other Yes Lipitor - HLD Prozac - MDD     Type of Medication Issue Identified Description of Issue Recommendation(s)  Drug Interaction(s) (clinically significant)     Duplicate Therapy     Allergy     No Medication Administration End Date     Incorrect Dose     Additional Drug Therapy Needed     Significant med changes from prior encounter (inform family/care partners about these prior to discharge).    Other       Clinically significant medication issues were identified that warrant physician communication and completion of prescribed/recommended actions by midnight of the next day:  No   Time spent performing this drug regimen review (minutes):  30    Tyheem Boughner BS, PharmD, BCPS Clinical Pharmacist 04/24/2024 2:02 PM  Contact: [phone] after 3 PM

## 2024-04-24 NOTE — Plan of Care (Signed)
  Problem: Education: Goal: Ability to describe self-care measures that may prevent or decrease complications (Diabetes Survival Skills Education) will improve Outcome: Progressing Goal: Individualized Educational Video(s) Outcome: Progressing   Problem: Coping: Goal: Ability to adjust to condition or change in health will improve Outcome: Progressing   Problem: Fluid Volume: Goal: Ability to maintain a balanced intake and output will improve Outcome: Progressing   Problem: Health Behavior/Discharge Planning: Goal: Ability to identify and utilize available resources and services will improve Outcome: Progressing Goal: Ability to manage health-related needs will improve Outcome: Progressing   Problem: Metabolic: Goal: Ability to maintain appropriate glucose levels will improve Outcome: Progressing   Problem: Nutritional: Goal: Maintenance of adequate nutrition will improve Outcome: Progressing Goal: Progress toward achieving an optimal weight will improve Outcome: Progressing   Problem: Skin Integrity: Goal: Risk for impaired skin integrity will decrease Outcome: Progressing   Problem: Tissue Perfusion: Goal: Adequacy of tissue perfusion will improve Outcome: Progressing   Problem: Education: Goal: Knowledge of disease or condition will improve Outcome: Progressing Goal: Knowledge of secondary prevention will improve (MUST DOCUMENT ALL) Outcome: Progressing Goal: Knowledge of patient specific risk factors will improve (DELETE if not current risk factor) Outcome: Progressing   Problem: Ischemic Stroke/TIA Tissue Perfusion: Goal: Complications of ischemic stroke/TIA will be minimized Outcome: Progressing   Problem: Coping: Goal: Will verbalize positive feelings about self Outcome: Progressing Goal: Will identify appropriate support needs Outcome: Progressing   Problem: Health Behavior/Discharge Planning: Goal: Ability to manage health-related needs will  improve Outcome: Progressing Goal: Goals will be collaboratively established with patient/family Outcome: Progressing   Problem: Self-Care: Goal: Ability to participate in self-care as condition permits will improve Outcome: Progressing Goal: Verbalization of feelings and concerns over difficulty with self-care will improve Outcome: Progressing Goal: Ability to communicate needs accurately will improve Outcome: Progressing   Problem: Nutrition: Goal: Risk of aspiration will decrease Outcome: Progressing Goal: Dietary intake will improve Outcome: Progressing   Problem: Education: Goal: Knowledge of General Education information will improve Description: Including pain rating scale, medication(s)/side effects and non-pharmacologic comfort measures Outcome: Progressing   Problem: Health Behavior/Discharge Planning: Goal: Ability to manage health-related needs will improve Outcome: Progressing   Problem: Clinical Measurements: Goal: Ability to maintain clinical measurements within normal limits will improve Outcome: Progressing Goal: Will remain free from infection Outcome: Progressing Goal: Diagnostic test results will improve Outcome: Progressing Goal: Respiratory complications will improve Outcome: Progressing Goal: Cardiovascular complication will be avoided Outcome: Progressing   Problem: Activity: Goal: Risk for activity intolerance will decrease Outcome: Progressing   Problem: Nutrition: Goal: Adequate nutrition will be maintained Outcome: Progressing   Problem: Coping: Goal: Level of anxiety will decrease Outcome: Progressing   Problem: Elimination: Goal: Will not experience complications related to bowel motility Outcome: Progressing Goal: Will not experience complications related to urinary retention Outcome: Progressing   Problem: Pain Managment: Goal: General experience of comfort will improve and/or be controlled Outcome: Progressing   Problem:  Safety: Goal: Ability to remain free from injury will improve Outcome: Progressing   Problem: Skin Integrity: Goal: Risk for impaired skin integrity will decrease Outcome: Progressing   Problem: Education: Goal: Knowledge of discharge needs will improve Outcome: Progressing   Problem: Clinical Measurements: Goal: Postoperative complications will be avoided or minimized Outcome: Progressing   Problem: Respiratory: Goal: Will achieve and/or maintain a regular respiratory rate, without signs or symptoms of dyspnea Outcome: Progressing   Problem: Skin Integrity: Goal: Demonstration of wound healing without infection will improve Outcome: Progressing

## 2024-04-24 NOTE — H&P (Shared)
 Physical Medicine and Rehabilitation Admission H&P     HPI: Christopher Roberts is a 78 year old right-handed male with history significant for diabetes mellitus, hypertension, hyperlipidemia, 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, tobacco use, pancytopenia followed by oncology [name] and being worked up for possible MDS.  Per chart review patient lives with wife.  Independent with cane/rolling walker.  Wife and granddaughter can assist as needed.  Presented 04/15/2024 after a fall when he tried to get up from bed around 10 AM.  He lost his balance and fell onto the floor and laid there for approxi-1 hour before being found by family member.  Family reported some right lower extremity weakness.  CT/MRI showed punctate acute infarct in the high right frontal and left parasagittal frontal lobes.  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  CTA showed near occlusive stenosis of the proximal right internal carotid artery with estimated stenosis 90% or greater.  Extensive calcified plaque within the proximal left internal carotid artery with estimated stenosis approximately 60 to 70%.  Moderate to severe stenosis of the P2 segment of the left posterior cerebral artery and moderate stenosis of the P2 segment of the right posterior cerebral artery.  Admission chemistries unremarkable except hemoglobin 10.3 RBCs 3.38, WBC 2.1, platelets 52,000, chloride 112, CO2 21, glucose 126, urine drug screen negative, hemoglobin A1c 5.6, total bilirubin 1.4-1.9, INR 1.3.  Echocardiogram with ejection fraction of 60 to 65% no wall motion abnormalities.  Follow-up vascular surgery Dr. Lonni Gaskins for symptomatic left ICA stenosis 60 to 70% underwent trans catheter placement of left cervical carotid artery stenting angioplasty 04/21/2024.  Patient currently maintained on aspirin  81 mg daily and Plavix  75 mg daily for CVA prophylaxis.  Hospital course with bouts of hypotension  currently on ProAmatine  and beta-blocker held.  Patient did have 1 episode of urinary retention requiring an In-N-Out catheterization with 1 L retained 12/13 without recurrence and placed on Flomax .  Findings of mildly elevated bilirubin imaging suggestive of mild cirrhosis plan outpatient workup.  Therapy evaluations completed due to patient's decreased functional mobility was admitted for a comprehensive rehab program.  Review of Systems  Constitutional:  Negative for chills and fever.  HENT:  Negative for hearing loss.   Eyes:  Negative for blurred vision and double vision.  Respiratory:  Negative for cough, shortness of breath and wheezing.   Cardiovascular:  Positive for palpitations. Negative for leg swelling.  Gastrointestinal:  Positive for constipation. Negative for heartburn, nausea and vomiting.  Genitourinary:  Positive for urgency. Negative for dysuria, flank pain and hematuria.  Musculoskeletal:  Positive for joint pain and myalgias.  Skin:  Negative for rash.  Neurological:  Positive for dizziness and weakness.  Psychiatric/Behavioral:  Positive for depression.   All other systems reviewed and are negative.  Past Medical History:  Diagnosis Date   CAD (coronary artery disease)    a. s/p MI in 2005 (symptom of indigestion); b. CABG x 3 in 3/09: L-LAD, S-OM, EF unknown   Colon polyps    Diabetes mellitus    DJD (degenerative joint disease)    HLD (hyperlipidemia)    HTN (hypertension)    MI (myocardial infarction) (HCC) 2005   manifested by indigestion   Murmur    echo 6/12:  Upper septal thickening, no LVOT gradient,, EF 65%, mild LAE     Past Surgical History:  Procedure Laterality Date   CHOLECYSTECTOMY  2002   CORONARY ARTERY BYPASS GRAFT  2009  x3   CORONARY/GRAFT ACUTE MI REVASCULARIZATION N/A 07/25/2020   Procedure: Coronary/Graft Acute MI Revascularization;  Surgeon: Jordan, Peter M, MD;  Location: [facility] INVASIVE CV LAB;  Service: Cardiovascular;  Laterality:  N/A;   LEFT HEART CATH AND CORS/GRAFTS ANGIOGRAPHY N/A 07/25/2020   Procedure: LEFT HEART CATH AND CORS/GRAFTS ANGIOGRAPHY;  Surgeon: Jordan, Peter M, MD;  Location: [facility] INVASIVE CV LAB;  Service: Cardiovascular;  Laterality: N/A;   TONSILLECTOMY     TRANSCAROTID ARTERY REVASCULARIZATION  Left 04/21/2024   Procedure: LEFT TRANSCAROTID ARTERY REVASCULARIZATION (TCAR);  Surgeon: [name], MD;  Location: [facility] OR;  Service: Vascular;  Laterality: Left;   Family History  Problem Relation Age of Onset   Colon cancer Mother    Heart attack Mother    High blood pressure Father    Diabetes Father    Social History:  reports that he has quit smoking. His smoking use included cigarettes. He has a 8 pack-year smoking history. He has been exposed to tobacco smoke. He has never used smokeless tobacco. He reports that he does not drink alcohol and does not use drugs. Allergies: Allergies[1] Medications Prior to Admission  Medication Sig Dispense Refill   amLODipine  (NORVASC ) 10 MG tablet Take 0.5 tablets (5 mg total) by mouth daily. 60 tablet 2   aspirin  81 MG tablet Take 81 mg by mouth daily.     atorvastatin  (LIPITOR ) 80 MG tablet Take 1 tablet (80 mg total) by mouth daily at 6 PM. 90 tablet 3   carvedilol  (COREG ) 12.5 MG tablet Take 1 tablet (12.5 mg total) by mouth 2 (two) times daily with a meal. 180 tablet 1   clopidogrel  (PLAVIX ) 75 MG tablet Take 1 tablet (75 mg total) by mouth daily. 90 tablet 1   FLUoxetine  (PROZAC ) 20 MG capsule Take 1 capsule (20 mg total) by mouth daily. 90 capsule 1   metFORMIN  (GLUCOPHAGE -XR) 500 MG 24 hr tablet Take 1 tablet (500 mg total) by mouth daily. 90 tablet 1   nitroGLYCERIN  (NITROSTAT ) 0.4 MG SL tablet Place 1 tablet (0.4 mg total) under the tongue every 5 (five) minutes as needed for chest pain. 25 tablet 1   traZODone (DESYREL) 50 MG tablet Take 50 mg by mouth at bedtime as needed.     meclizine  (ANTIVERT ) 25 MG tablet Take 1 tablet (25 mg total) by  mouth 3 (three) times daily as needed. (Patient not taking: Reported on 04/15/2024) 30 tablet 3      Home: Home Living Family/patient expects to be discharged to:: Private residence Living Arrangements: Spouse/significant other Available Help at Discharge: Family, Available 24 hours/day Type of Home: House Home Access: Level entry Home Layout: Two level, Bed/bath upstairs, 1/2 bath on main level Alternate Level Stairs-Number of Steps: 12 Alternate Level Stairs-Rails: Left Bathroom Shower/Tub: Health Visitor: Standard Bathroom Accessibility: Yes Home Equipment: Agricultural Consultant (2 wheels), Cane - single point, Rollator (4 wheels), Shower seat - built in, Hand held shower head Additional Comments: Pt lives with wife who is able to assist as needed. Pt has granddaughters who live there too who can help  Lives With: Spouse, Family   Functional History: Prior Function Prior Level of Function : Independent/Modified Independent Mobility Comments: Mod I using cane, sometimes, RW. About 4 falls in the past 84mo. ADLs Comments: Indep with basic self care. Spouse can help him with medications, prompting him to take it. Drives in the daytime, very seldom. Retired since '05.  Functional Status:  Mobility: Bed Mobility Overal  bed mobility: Needs Assistance Bed Mobility: Sit to Supine Supine to sit: Min assist Sit to supine: Supervision General bed mobility comments: pt up in chair on arrival and at end of session Transfers Overall transfer level: Needs assistance Equipment used: Rolling walker (2 wheels) Transfers: Sit to/from Stand, Bed to chair/wheelchair/BSC Sit to Stand: Min assist, Contact guard assist Bed to/from chair/wheelchair/BSC transfer type:: Step pivot Step pivot transfers: Mod assist General transfer comment: pt with good recall for hand placement with time, grossly CGA to stand from recliner chair x2 up to RW. min A to maintain balance with serial sit<>stands  x7 at end of session with no RW for support Ambulation/Gait Ambulation/Gait assistance: Mod assist, +2 safety/equipment (chair follow) Gait Distance (Feet): 40 Feet (+ 20') Assistive device: Rolling walker (2 wheels) Gait Pattern/deviations: Step-to pattern, Decreased weight shift to right, Decreased stance time - right, Decreased dorsiflexion - right General Gait Details: mod A to maintain balance, pt ambualting with R knee flexed in stance and with narrow BOS, pt able to widen BOS however then externally rotating RLE, with fatigue R knee flexion in stance increasing, chair follow and x1 seated rest Gait velocity: decr Pre-gait activities: Pt engaged in standing weight shifts inside RW with minA. He c/o worsening dizziness. Instructed pt to return to sitting EOB.    ADL: ADL Overall ADL's : Needs assistance/impaired Eating/Feeding: Independent Grooming: Set up, Sitting Upper Body Bathing: Set up, Sitting Lower Body Bathing: Moderate assistance, Sitting/lateral leans Upper Body Dressing : Set up, Sitting Lower Body Dressing: Minimal assistance, Sit to/from stand Lower Body Dressing Details (indicate cue type and reason): pt  able to manage socks, min assist to stand Toilet Transfer: Minimal assistance, Ambulation, Rolling walker (2 wheels) Toilet Transfer Details (indicate cue type and reason): Increased distance min fading to CGA to manage RLE Toileting- Clothing Manipulation and Hygiene: Minimal assistance, Sit to/from stand Functional mobility during ADLs: Minimal assistance, Rolling walker (2 wheels) General ADL Comments: Min assist to stand. Reviewed hemi dressing techniques  Cognition: Cognition Overall Cognitive Status: Impaired/Different from baseline Arousal/Alertness: Awake/alert Orientation Level: Oriented X4 Year: 2024 Day of Week: Incorrect Attention: Sustained Sustained Attention: Impaired Sustained Attention Impairment: Verbal basic, Functional basic Memory:  Impaired Memory Impairment: Storage deficit, Retrieval deficit Awareness: Impaired Awareness Impairment: Intellectual impairment, Emergent impairment, Anticipatory impairment Problem Solving: Impaired Problem Solving Impairment: Verbal basic, Functional basic Executive Function: Sequencing, Organizing Sequencing: Impaired Sequencing Impairment: Verbal basic, Functional basic Organizing: Impaired Organizing Impairment: Verbal basic, Functional basic Safety/Judgment: Impaired Cognition Arousal: Alert Behavior During Therapy: WFL for tasks assessed/performed Overall Cognitive Status: Impaired/Different from baseline  Physical Exam: Blood pressure (!) 103/57, pulse 83, temperature 97.8 F (36.6 C), temperature source Oral, resp. rate 18, height 5' 10 (1.778 m), weight 77.6 kg, SpO2 98%. Physical Exam Neurological:     Comments: Patient is alert.  Sitting up in bed.  Makes eye contact with examiner.  Follows simple commands.  Provides name and age.  Some delay in processing in regards to month and year     Results for orders placed or performed during the hospital encounter of 04/15/24 (from the past 48 hours)  Glucose, capillary     Status: Abnormal   Collection Time: 04/22/24  4:47 PM  Result Value Ref Range   Glucose-Capillary 152 (H) 70 - 99 mg/dL    Comment: Glucose reference range applies only to samples taken after fasting for at least 8 hours.  Glucose, capillary     Status: Abnormal   Collection Time:  04/22/24  9:23 PM  Result Value Ref Range   Glucose-Capillary 149 (H) 70 - 99 mg/dL    Comment: Glucose reference range applies only to samples taken after fasting for at least 8 hours.   Comment 1 Notify RN    Comment 2 Document in Chart   Basic metabolic panel with GFR     Status: Abnormal   Collection Time: 04/23/24  4:00 AM  Result Value Ref Range   Sodium 139 135 - 145 mmol/L   Potassium 4.4 3.5 - 5.1 mmol/L   Chloride 108 98 - 111 mmol/L   CO2 23 22 - 32 mmol/L    Glucose, Bld 124 (H) 70 - 99 mg/dL    Comment: Glucose reference range applies only to samples taken after fasting for at least 8 hours.   BUN 22 8 - 23 mg/dL   Creatinine, Ser 8.87 0.61 - 1.24 mg/dL   Calcium  8.5 (L) 8.9 - 10.3 mg/dL   GFR, Estimated >39 >39 mL/min    Comment: (NOTE) Calculated using the CKD-EPI Creatinine Equation (2021)    Anion gap 7 5 - 15    Comment: Performed at [facility], [address] [place], [place] [address]  Magnesium      Status: None   Collection Time: 04/23/24  4:00 AM  Result Value Ref Range   Magnesium  1.7 1.7 - 2.4 mg/dL    Comment: Performed at [facility], [address], [place], [place] [address]  Phosphorus     Status: None   Collection Time: 04/23/24  4:00 AM  Result Value Ref Range   Phosphorus 2.6 2.5 - 4.6 mg/dL    Comment: Performed at [facility], [address], [place], [place] [address]  Glucose, capillary     Status: Abnormal   Collection Time: 04/23/24  6:07 AM  Result Value Ref Range   Glucose-Capillary 123 (H) 70 - 99 mg/dL    Comment: Glucose reference range applies only to samples taken after fasting for at least 8 hours.   Comment 1 Notify RN    Comment 2 Document in Chart   Glucose, capillary     Status: Abnormal   Collection Time: 04/23/24  8:33 AM  Result Value Ref Range   Glucose-Capillary 118 (H) 70 - 99 mg/dL    Comment: Glucose reference range applies only to samples taken after fasting for at least 8 hours.  CBC with Differential/Platelet     Status: Abnormal   Collection Time: 04/23/24  9:24 AM  Result Value Ref Range   WBC 2.5 (L) 4.0 - 10.5 K/uL   RBC 2.91 (L) 4.22 - 5.81 MIL/uL   Hemoglobin 9.0 (L) 13.0 - 17.0 g/dL   HCT 73.5 (L) 60.9 - 47.9 %   MCV 90.7 80.0 - 100.0 fL   MCH 30.9 26.0 - 34.0 pg   MCHC 34.1 30.0 - 36.0 g/dL   RDW 81.1 (H) 88.4 - 84.4 %   Platelets 53 (L) 150 - 400 K/uL    Comment: REPEATED TO VERIFY Immature Platelet Fraction may be clinically indicated,  consider ordering this additional test [id number]    nRBC 1.2 (H) 0.0 - 0.2 %   Neutrophils Relative % 56 %   Neutro Abs 1.4 (L) 1.7 - 7.7 K/uL   Lymphocytes Relative 37 %   Lymphs Abs 0.9 0.7 - 4.0 K/uL   Monocytes Relative 4 %   Monocytes Absolute 0.1 0.1 - 1.0 K/uL   Eosinophils Relative  1 %   Eosinophils Absolute 0.0 0.0 - 0.5 K/uL   Basophils Relative 2 %   Basophils Absolute 0.1 0.0 - 0.1 K/uL   WBC Morphology See Note     Comment:  Abnormal Lymphocytes Present  Smudge Cells  Vaculated Neutrophils   Smear Review See Note     Comment:  Normal Platelet Morphology   Schistocytes PRESENT    Burr Cells PRESENT     Comment: Performed at [facility], [address], [place], [place] [address]  Glucose, capillary     Status: Abnormal   Collection Time: 04/23/24 10:58 AM  Result Value Ref Range   Glucose-Capillary 108 (H) 70 - 99 mg/dL    Comment: Glucose reference range applies only to samples taken after fasting for at least 8 hours.  Glucose, capillary     Status: Abnormal   Collection Time: 04/23/24  4:10 PM  Result Value Ref Range   Glucose-Capillary 151 (H) 70 - 99 mg/dL    Comment: Glucose reference range applies only to samples taken after fasting for at least 8 hours.  Glucose, capillary     Status: Abnormal   Collection Time: 04/23/24  9:15 PM  Result Value Ref Range   Glucose-Capillary 115 (H) 70 - 99 mg/dL    Comment: Glucose reference range applies only to samples taken after fasting for at least 8 hours.  Lactate dehydrogenase     Status: None   Collection Time: 04/23/24  9:37 PM  Result Value Ref Range   LDH 227 105 - 235 U/L    Comment: Performed at [facility], [address], [place], [place] [address]  Magnesium      Status: None   Collection Time: 04/24/24  4:03 AM  Result Value Ref Range   Magnesium  2.0 1.7 - 2.4 mg/dL    Comment: Performed at [facility], [address], [place], [place] [address]  Basic metabolic panel with GFR      Status: Abnormal   Collection Time: 04/24/24  4:03 AM  Result Value Ref Range   Sodium 138 135 - 145 mmol/L   Potassium 4.0 3.5 - 5.1 mmol/L   Chloride 107 98 - 111 mmol/L   CO2 24 22 - 32 mmol/L   Glucose, Bld 115 (H) 70 - 99 mg/dL    Comment: Glucose reference range applies only to samples taken after fasting for at least 8 hours.   BUN 20 8 - 23 mg/dL   Creatinine, Ser 9.02 0.61 - 1.24 mg/dL   Calcium  8.4 (L) 8.9 - 10.3 mg/dL   GFR, Estimated >39 >39 mL/min    Comment: (NOTE) Calculated using the CKD-EPI Creatinine Equation (2021)    Anion gap 7 5 - 15    Comment: Performed at [facility], [address], [place], [place] [address]  CBC with Differential/Platelet     Status: Abnormal   Collection Time: 04/24/24  4:03 AM  Result Value Ref Range   WBC 2.1 (L) 4.0 - 10.5 K/uL   RBC 2.78 (L) 4.22 - 5.81 MIL/uL   Hemoglobin 8.5 (L) 13.0 - 17.0 g/dL   HCT 75.0 (L) 60.9 - 47.9 %   MCV 89.6 80.0 - 100.0 fL   MCH 30.6 26.0 - 34.0 pg   MCHC 34.1 30.0 - 36.0 g/dL   RDW 81.4 (H) 88.4 - 84.4 %   Platelets 57 (L) 150 - 400 K/uL    Comment: REPEATED TO VERIFY Immature Platelet  Fraction may be clinically indicated, consider ordering this additional test [id number]    nRBC 1.0 (H) 0.0 - 0.2 %   Neutrophils Relative % 32 %   Neutro Abs 0.7 (L) 1.7 - 7.7 K/uL   Lymphocytes Relative 48 %   Lymphs Abs 1.0 0.7 - 4.0 K/uL   Monocytes Relative 10 %   Monocytes Absolute 0.2 0.1 - 1.0 K/uL   Eosinophils Relative 2 %   Eosinophils Absolute 0.0 0.0 - 0.5 K/uL   Basophils Relative 1 %   Basophils Absolute 0.0 0.0 - 0.1 K/uL   WBC Morphology See Note     Comment: SMUDGE CELLS   Smear Review Normal platelet morphology    Immature Granulocytes 7 %   Abs Immature Granulocytes 0.15 (H) 0.00 - 0.07 K/uL   Schistocytes PRESENT    Polychromasia PRESENT     Comment: Performed at [facility], [address], [place], [place] [address]  Glucose, capillary     Status: Abnormal    Collection Time: 04/24/24  6:13 AM  Result Value Ref Range   Glucose-Capillary 107 (H) 70 - 99 mg/dL    Comment: Glucose reference range applies only to samples taken after fasting for at least 8 hours.  Glucose, capillary     Status: Abnormal   Collection Time: 04/24/24 11:43 AM  Result Value Ref Range   Glucose-Capillary 170 (H) 70 - 99 mg/dL    Comment: Glucose reference range applies only to samples taken after fasting for at least 8 hours.   Comment 1 Notify RN    Comment 2 Document in Chart    No results found.    Blood pressure (!) 103/57, pulse 83, temperature 97.8 F (36.6 C), temperature source Oral, resp. rate 18, height 5' 10 (1.778 m), weight 77.6 kg, SpO2 98%.  Medical Problem List and Plan: 1. Functional deficits secondary to left small ACA and right punctate MCA/ACA infarct etiology likely due to large vessel disease from bilateral ICA stenosis  -patient may *** shower  -ELOS/Goals: *** 2.  Antithrombotics: -DVT/anticoagulation:  Mechanical: Antiembolism stockings, thigh (TED hose) Bilateral lower extremities  -antiplatelet therapy: Aspirin  81 mg daily and Plavix  75 mg daily 3. Pain Management: Tylenol  as needed 4. Mood/Behavior/Sleep: Prozac  20 mg daily.  -antipsychotic agents: N/A 5. Neuropsych/cognition: This patient is capable of making decisions on his own behalf. 6. Skin/Wound Care: Routine skin checks 7. Fluids/Electrolytes/Nutrition: Routine and analysis with follow-up chemistries 8.  Symptomatic left carotid artery stenosis.  Status post stenting/angioplasty 04/21/2024 per Dr. Gretta 9.  Hypotension.  ProAmatine  10 mg 3 times daily.  Monitor with increased mobility 10.  Diabetes mellitus.  Hemoglobin A1c 5.6.  Currently on SSI.  Prior to admission patient on Glucophage  500 mg daily.  Resume as needed 11.  Hyperlipidemia.  Lipitor  12.  History of CAD/PCI/CABG/SVT.  Follow-up [name] outpatient 13.  Pancytopenia.  Heme oncology Dr.Pasam outpatient.   Follow-up CBC 14.  Cirrhosis of liver.  Imaging suggestive of cirrhosis.  Bili mildly elevated as well as INR 1.3.  Follow-up outpatient 15.  Urinary retention.  In-N-Out catheterization 12/13 x 1.  Placed on Flomax .  Check PVR 16.  Constipation.  Colace 100 mg daily Toribio JINNY Pitch, PA-C 04/24/2024     [1] No Known Allergies

## 2024-04-24 NOTE — TOC Transition Note (Signed)
 Transition of Care (TOC) - Discharge Note Rayfield Gobble RN, BSN Inpatient Care Management Unit 4E- RN Case Manager See Treatment Team for direct phone #   Patient Details  Name: Christopher Roberts MRN: [id number] Date of Birth: 03-Feb-1946  Transition of Care [facility]) CM/SW Contact:  Gobble Rayfield Hurst, RN Phone Number: 04/24/2024, 2:05 PM   Clinical Narrative:    CM notified by Davene CALKINS rehab admission coordinator that pt has been approved by Insurance for INPT rehab admit and bed available today.   MD has cleared pt for transition to [facility] INPT rehab. Unit staff will transport to CIR once bed has been assigned.   No further IP CM needs noted.    Final next level of care: IP Rehab Facility Barriers to Discharge: Barriers Resolved   Patient Goals and CMS Choice Patient states their goals for this hospitalization and ongoing recovery are:: rehab then plan to return home   Choice offered to / list presented to : Patient      Discharge Placement                 [facility] INPT rehab      Discharge Plan and Services Additional resources added to the After Visit Summary for     Discharge Planning Services: CM Consult Post Acute Care Choice: IP Rehab          DME Arranged: N/A DME Agency: NA       HH Arranged: NA HH Agency: NA        Social Drivers of Health (SDOH) Interventions SDOH Screenings   Food Insecurity: No Food Insecurity (04/16/2024)  Recent Concern: Food Insecurity - Food Insecurity Present (03/17/2024)  Housing: Low Risk (04/16/2024)  Transportation Needs: No Transportation Needs (04/16/2024)  Utilities: Not At Risk (04/16/2024)  Recent Concern: Utilities - At Risk (03/17/2024)  Alcohol Screen: Low Risk (03/12/2024)  Depression (PHQ2-9): High Risk (03/17/2024)  Financial Resource Strain: Low Risk (03/12/2024)  Physical Activity: Inactive (03/12/2024)  Social Connections: Moderately Isolated (04/16/2024)  Stress: No Stress Concern Present (03/12/2024)   Tobacco Use: Medium Risk (04/21/2024)  Health Literacy: Adequate Health Literacy (03/12/2024)     Readmission Risk Interventions    04/24/2024    2:05 PM  Readmission Risk Prevention Plan  Post Dischage Appt Complete  Medication Screening Complete  Transportation Screening Complete

## 2024-04-24 NOTE — Progress Notes (Signed)
 Mobility Specialist Progress Note;   04/24/24 0853  Mobility  Activity Pivoted/transferred from bed to chair  Level of Assistance Minimal assist, patient does 75% or more  Assistive Device None  Distance Ambulated (ft) 5 ft  Activity Response Tolerated well  Mobility Referral Yes  Mobility visit 1 Mobility  Mobility Specialist Start Time (ACUTE ONLY) 0853  Mobility Specialist Stop Time (ACUTE ONLY) 0900  Mobility Specialist Time Calculation (min) (ACUTE ONLY) 7 min   Pt agreeable to mobility. Required MinA to stand and pivot from bedside w/ no AD. VSS throughout. Pt states mobilizing today felt better than yesterday. No c/o when asked. Pt left in chair with all needs met, alarm on.  Lauraine Erm Mobility Specialist Please contact via SecureChat or [employer] [phone]

## 2024-04-24 NOTE — Progress Notes (Addendum)
° °  Inpatient Rehabilitation Admissions Coordinator   I await insurance approval for possible CIR admit.  Heron Leavell, RN, MSN Rehab Admissions Coordinator [phone] 04/24/2024 12:14 PM  I have insurance approval and CIR bed to admit him to today. I met with patient and spoke with his wife by phone . They are in agreement to admit. I will make the arrangements.  Heron Leavell, RN, MSN Rehab Admissions Coordinator [phone] 04/24/2024 1:55 PM

## 2024-04-24 NOTE — Progress Notes (Signed)
 PROGRESS NOTE    Christopher Roberts  FMW:[id number] DOB: 1946/03/26 DOA: 04/15/2024 PCP: [name], [name], MD  No chief complaint on file.   Brief Narrative:    78 yrs old Male with PMH significant for CAD status post PCI in 2022, diabetes mellitus type 2, pancytopenia following up with Dr. Autumn oncologist and being worked up for possible MDS, hypertension who had Christopher Roberts fall today morning when he tried to get up from the bed around 10 AM.  Patient lost balance and fell onto the floor and was on the floor for almost an hour when his family member found him.  Later noticed to have right lower extremity weakness and was brought in the ED.  EMS noted that his right leg was flaccid.   MRI brain shows features concerning for acute punctate stroke. Patient's right lower extremity slowly improving and is able to move at this time and his strength is around 3/ 5.  Neurologist was consulted and recommended stroke workup for acute stroke.  Patient was admitted for further evaluation.  Assessment & Plan:   Principal Problem:   Acute CVA (cerebrovascular accident) [facility] Active Problems:   Hyperlipidemia   Essential hypertension   Coronary artery disease due to lipid rich plaque   Diabetes mellitus (HCC)   Cirrhosis of liver (HCC)   Carotid stenosis, left  Acute CVA: R punctate MCA/ACA infarcts Patient presented with RLE weakness, MRI shows acute punctate stroke in high R frontal and L parasagittal frontal lobes.   Patient was evaluated by Neurology, L small ACA and R punctate MCA/ACA infarcts, likely due to large vessel disease from bilateral ICA stenosis - recommending DAPT, further regimen per [facility] (recommending DAPT).  Lipitor  80 mg. Dysphagia III diet. CTA Head/ Neck showed right ICA stenosis > 90, left ICA stenosis  60 to 70% 2D echo shows LVEF 60 to 65%, LA moderately dilated. LDL 78 Near goal.  Hb A1c 5.9.  Drug screen negative. PT and OT evaluation.  Aggressive risk factor  modification. Vascular surgery consulted for bilateral carotid artery stenosis.  Awaiting CIR placement.   Bilateral carotid artery stenosis: Vascular surgery is consulted.  Carotid duplex > R ICA 60-79% stenosis, L ICA 40-59% stenosis  Vascular surgery consulted  Now s/p Left TCAR on 04/21/2024.  Continue DAPT, statin.  Needs staged procedure on R.   Essential hypertension , now Hypotension: Given acute stroke , and low BP  hold antihypertensives - currently on midodrine  Goal SBP 130 - 150    History of CAD status post PCI: S/p LHC 07/2020 with unsucessful PCI of SVG to PDA - at that time DAPT recommended Continue antiplatelet and statins, hold beta-blockers.   Urinary Retention Required I/O cath yesterday with 1 L retained 12/13 S/p TOV 12/16, doing well - watch for recurrent retention  Pancytopenia :  Seen by oncology 12/10 - concern for MDS, when deemed safe, it is adviseable to proceed with bone marrow biopsy/aspiration by IR for further evaluation of pancytopenia Scheduled outpatient on 12/26 Heme onc ok with outpatient follow up for bx Transfuse prn to maintain hb >7 and platelets >20,000.   S/p 1 unit pRBC and 1 unit platelets  Goal platelets over 50,000 in case of active bleeding or interventional procedures.  Schistocytes - haptoglobin, LDH wnl   History of SVT : He is being followed by cardiologist. HR controlled.   Diabetes mellitus type 2:  A1c 5.6 Ssi for now   Cirrhosis of liver : Imaging suggestive of cirrhosis  Bili elevated, INR mildly elevated, platelets low, albumin  mildly low Needs further outpatient workup    Depression Continue fluoxetine .    DVT prophylaxis: SCD Code Status: full Family Communication: wife at bedside 12/15 Disposition:   Status is: Inpatient Remains inpatient appropriate because: need for continued inpatient care   Consultants:  Neurology vascular  Procedures:  Carotid US   Echo IMPRESSIONS     1. Left  ventricular ejection fraction, by estimation, is 60 to 65%. The  left ventricle has normal function. The left ventricle has no regional  wall motion abnormalities. There is mild asymmetric left ventricular  hypertrophy of the basal-septal segment.  Left ventricular diastolic parameters are consistent with Grade I  diastolic dysfunction (impaired relaxation).   2. Right ventricular systolic function is normal. The right ventricular  size is mildly enlarged. There is normal pulmonary artery systolic  pressure. The estimated right ventricular systolic pressure is 16.1 mmHg.   3. Left atrial size was moderately dilated.   4. The mitral valve is degenerative. Mild mitral valve regurgitation. No  evidence of mitral stenosis. Moderate mitral annular calcification.   5. The aortic valve is tricuspid. There is mild calcification of the  aortic valve. There is mild thickening of the aortic valve. Aortic valve  regurgitation is trivial. Aortic valve sclerosis is present, with no  evidence of aortic valve stenosis.   6. The inferior vena cava is normal in size with greater than 50%  respiratory variability, suggesting right atrial pressure of 3 mmHg.   Conclusion(s)/Recommendation(s): No intracardiac source of embolism  detected on this transthoracic study. Consider [name] transesophageal  echocardiogram to exclude cardiac source of embolism if clinically  indicated.   Antimicrobials:  Anti-infectives (From admission, onward)    Start     Dose/Rate Route Frequency Ordered Stop   04/21/24 1800  ceFAZolin  (ANCEF ) IVPB 2g/100 mL premix        2 g 200 mL/hr over 30 Minutes Intravenous Every 8 hours 04/21/24 1704 04/22/24 0308   04/21/24 0000  ceFAZolin  (ANCEF ) IVPB 1 g/50 mL premix       Note to Pharmacy: Send with pt to OR   1 g 100 mL/hr over 30 Minutes Intravenous On call 04/20/24 1019 04/21/24 1050       Subjective: No complaints Eager to get to rehab  Objective: Vitals:   04/23/24 1612  04/23/24 1952 04/23/24 2324 04/24/24 0800  BP: (!) 125/58 131/81 (!) 129/100 103/60  Pulse: 72 67 67 65  Resp: 14 20 20    Temp: 98.2 F (36.8 C) 98 F (36.7 C) 98 F (36.7 C)   TempSrc: Oral Oral Oral   SpO2: 100% 97% 98% 96%  Weight:      Height:        Intake/Output Summary (Last 24 hours) at 04/24/2024 1041 Last data filed at 04/24/2024 0500 Gross per 24 hour  Intake 50 ml  Output 450 ml  Net -400 ml   Filed Weights   04/16/24 1847  Weight: 77.6 kg    Examination:  General: No acute distress. Cardiovascular: RRR Lungs: unlabored Neurological: 4/5 RLE weakness. Extremities: No clubbing or cyanosis. No edema.   Data Reviewed: I have personally reviewed following labs and imaging studies  CBC: Recent Labs  Lab 04/20/24 0638 04/21/24 0404 04/22/24 0308 04/23/24 0924 04/24/24 0403  WBC 1.9* 2.4* 2.0* 2.5* 2.1*  NEUTROABS  --   --  1.0* 1.4* 0.7*  HGB 8.4* 8.1* 8.4* 9.0* 8.5*  HCT 23.8* 23.4* 23.9* 26.4* 24.9*  MCV 86.9 87.3 87.9 90.7 89.6  PLT 49* 49* 50* 53* 57*    Basic Metabolic Panel: Recent Labs  Lab 04/18/24 0225 04/20/24 0638 04/21/24 0404 04/22/24 0308 04/23/24 0400 04/24/24 0403  NA 134* 138 139 141 139 138  K 3.4* 3.6 3.8 4.1 4.4 4.0  CL 107 111 109 108 108 107  CO2 21* 18* 22 21* 23 24  GLUCOSE 122* 111* 105* 204* 124* 115*  BUN 18 16 18 21 22 20   CREATININE 1.22 1.13 1.14 1.26* 1.12 0.97  CALCIUM  7.4* 8.3* 8.1* 8.4* 8.5* 8.4*  MG 1.9 1.6*  --  1.9 1.7 2.0  PHOS 3.1 3.2  --  2.7 2.6  --     GFR: Estimated Creatinine Clearance: 63.8 mL/min (by C-G formula based on SCr of 0.97 mg/dL).  Liver Function Tests: Recent Labs  Lab 04/22/24 0308  AST 35  ALT 21  ALKPHOS 92  BILITOT 1.3*  PROT 6.0*  ALBUMIN  2.6*    CBG: Recent Labs  Lab 04/23/24 0833 04/23/24 1058 04/23/24 1610 04/23/24 2115 04/24/24 0613  GLUCAP 118* 108* 151* 115* 107*     Recent Results (from the past 240 hours)  Surgical pcr screen     Status: None    Collection Time: 04/20/24  1:54 PM   Specimen: Nasal Mucosa; Nasal Swab  Result Value Ref Range Status   MRSA, PCR NEGATIVE NEGATIVE Final   Staphylococcus aureus NEGATIVE NEGATIVE Final    Comment: (NOTE) The Xpert SA Assay (FDA approved for NASAL specimens in patients 6 years of age and older), is one component of Nikkole Placzek comprehensive surveillance program. It is not intended to diagnose infection nor to guide or monitor treatment. Performed at [facility], [address] [place], [place] [address]          Radiology Studies: No results found.       Scheduled Meds:  aspirin   81 mg Oral Daily   atorvastatin   80 mg Oral q1800   Chlorhexidine  Gluconate Cloth  6 each Topical Daily   clopidogrel   75 mg Oral Daily   docusate sodium   100 mg Oral Daily   feeding supplement  237 mL Oral BID BM   FLUoxetine   20 mg Oral Daily   insulin  aspart  0-5 Units Subcutaneous QHS   insulin  aspart  0-9 Units Subcutaneous TID WC   midodrine   10 mg Oral TID WC   sodium chloride  flush  3 mL Intravenous Q12H   tamsulosin   0.4 mg Oral QPC supper   Continuous Infusions:  sodium chloride         LOS: 8 days    Time spent: over 30 min     Meliton Monte, MD [facility]   To contact the attending provider between 7A-7P or the covering provider during after hours 7P-7A, please log into the web site [email or web address] and access using universal [place] password for that web site. If you do not have the password, please call the hospital operator.  04/24/2024, 10:41 AM

## 2024-04-24 NOTE — H&P (Signed)
 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, tobacco use, pancytopenia followed by oncology [name] and being worked up for possible MDS.  Per chart review patient lives with wife.  Independent with cane/rolling walker.  Wife and granddaughter can assist as needed.  Presented 04/15/2024 after a fall when he tried to get up from bed around 10 AM.  He lost his balance and fell onto the floor and laid there for approxi-1 hour before being found by family member.  Family reported some right lower extremity weakness.  CT/MRI showed punctate acute infarct in the high right frontal and left parasagittal frontal lobes.  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  CTA showed near occlusive stenosis of the proximal right internal carotid artery with estimated stenosis 90% or greater.  Extensive calcified plaque within the proximal left internal carotid artery with estimated stenosis approximately 60 to 70%.  Moderate to severe stenosis of the P2 segment of the left posterior cerebral artery and moderate stenosis of the P2 segment of the right posterior cerebral artery.  Admission chemistries unremarkable except hemoglobin 10.3 RBCs 3.38, WBC 2.1, platelets 52,000, chloride 112, CO2 21, glucose 126, urine drug screen negative, hemoglobin A1c 5.6, total bilirubin 1.4-1.9, INR 1.3.  Echocardiogram with ejection fraction of 60 to 65% no wall motion abnormalities.  Follow-up vascular surgery Dr. Lonni Gaskins for symptomatic left ICA stenosis 60 to 70% underwent trans catheter placement of left cervical carotid artery stenting angioplasty 04/21/2024.  Patient currently maintained on aspirin  81 mg daily and Plavix  75 mg daily for CVA prophylaxis.  Hospital course with bouts of hypotension  currently on ProAmatine  and beta-blocker held.  Patient did have 1 episode of urinary retention requiring an In-N-Out catheterization with 1 L retained 12/13 without recurrence and placed on Flomax .  Findings of mildly elevated bilirubin imaging suggestive of mild cirrhosis plan outpatient workup.  Therapy evaluations completed due to patient's decreased functional mobility was admitted for a comprehensive rehab program.   Review of Systems  Constitutional:  Negative for chills and fever.  HENT:  Negative for hearing loss.   Eyes:  Negative for blurred vision and double vision.  Respiratory:  Negative for cough, shortness of breath and wheezing.   Cardiovascular:  Positive for palpitations. Negative for leg swelling.  Gastrointestinal:  Positive for constipation. Negative for heartburn, nausea and vomiting.  Genitourinary:  Positive for urgency. Negative for dysuria, flank pain and hematuria.  Musculoskeletal:  Positive for joint pain and myalgias.  Skin:  Negative for rash.  Neurological:  Positive for dizziness and weakness.  Psychiatric/Behavioral:  Positive for depression.   All other systems reviewed and are negative.      Past Medical History:  Diagnosis Date   CAD (coronary artery disease)      a. s/p MI in 2005 (symptom of indigestion); b. CABG x 3 in 3/09: L-LAD, S-OM, EF unknown   Colon polyps     Diabetes mellitus     DJD (degenerative joint disease)     HLD (hyperlipidemia)     HTN (hypertension)     MI (myocardial infarction) (HCC) 2005    manifested by indigestion   Murmur      echo 6/12:  Upper septal thickening, no LVOT gradient,, EF 65%, mild LAE  Past Surgical History:  Procedure Laterality Date   CHOLECYSTECTOMY   2002   CORONARY ARTERY BYPASS GRAFT   2009    x3   CORONARY/GRAFT ACUTE MI REVASCULARIZATION N/A 07/25/2020    Procedure: Coronary/Graft Acute MI Revascularization;  Surgeon: Jordan, Peter M, MD;  Location: [facility] INVASIVE CV LAB;   Service: Cardiovascular;  Laterality: N/A;   LEFT HEART CATH AND CORS/GRAFTS ANGIOGRAPHY N/A 07/25/2020    Procedure: LEFT HEART CATH AND CORS/GRAFTS ANGIOGRAPHY;  Surgeon: Jordan, Peter M, MD;  Location: [facility] INVASIVE CV LAB;  Service: Cardiovascular;  Laterality: N/A;   TONSILLECTOMY       TRANSCAROTID ARTERY REVASCULARIZATION  Left 04/21/2024    Procedure: LEFT TRANSCAROTID ARTERY REVASCULARIZATION (TCAR);  Surgeon: [name], MD;  Location: [facility] OR;  Service: Vascular;  Laterality: Left;             Family History  Problem Relation Age of Onset   Colon cancer Mother     Heart attack Mother     High blood pressure Father     Diabetes Father          Social History:  reports that he has quit smoking. His smoking use included cigarettes. He has a 8 pack-year smoking history. He has been exposed to tobacco smoke. He has never used smokeless tobacco. He reports that he does not drink alcohol and does not use drugs. Allergies: [Allergies]  [Allergies] No Known Allergies       Medications Prior to Admission  Medication Sig Dispense Refill   amLODipine  (NORVASC ) 10 MG tablet Take 0.5 tablets (5 mg total) by mouth daily. 60 tablet 2   aspirin  81 MG tablet Take 81 mg by mouth daily.       atorvastatin  (LIPITOR ) 80 MG tablet Take 1 tablet (80 mg total) by mouth daily at 6 PM. 90 tablet 3   carvedilol  (COREG ) 12.5 MG tablet Take 1 tablet (12.5 mg total) by mouth 2 (two) times daily with a meal. 180 tablet 1   clopidogrel  (PLAVIX ) 75 MG tablet Take 1 tablet (75 mg total) by mouth daily. 90 tablet 1   FLUoxetine  (PROZAC ) 20 MG capsule Take 1 capsule (20 mg total) by mouth daily. 90 capsule 1   metFORMIN  (GLUCOPHAGE -XR) 500 MG 24 hr tablet Take 1 tablet (500 mg total) by mouth daily. 90 tablet 1   nitroGLYCERIN  (NITROSTAT ) 0.4 MG SL tablet Place 1 tablet (0.4 mg total) under the tongue every 5 (five) minutes as needed for chest pain. 25 tablet 1   traZODone (DESYREL) 50 MG tablet Take  50 mg by mouth at bedtime as needed.       meclizine  (ANTIVERT ) 25 MG tablet Take 1 tablet (25 mg total) by mouth 3 (three) times daily as needed. (Patient not taking: Reported on 04/15/2024) 30 tablet 3              Home: Home Living Family/patient expects to be discharged to:: Private residence Living Arrangements: Spouse/significant other Available Help at Discharge: Family, Available 24 hours/day Type of Home: House Home Access: Level entry Home Layout: Two level, Bed/bath upstairs, 1/2 bath on main level Alternate Level Stairs-Number of Steps: 12 Alternate Level Stairs-Rails: Left Bathroom Shower/Tub: Health Visitor: Standard Bathroom Accessibility: Yes Home Equipment: Agricultural Consultant (2 wheels), Cane - single point, Rollator (4 wheels), Shower seat - built in, Hand held shower head Additional Comments: Pt lives with wife who is able to assist as needed. Pt has granddaughters who live there  too who can help  Lives With: Spouse, Family   Functional History: Prior Function Prior Level of Function : Independent/Modified Independent Mobility Comments: Mod I using cane, sometimes, RW. About 4 falls in the past 76mo. ADLs Comments: Indep with basic self care. Spouse can help him with medications, prompting him to take it. Drives in the daytime, very seldom. Retired since '05.   Functional Status:  Mobility: Bed Mobility Overal bed mobility: Needs Assistance Bed Mobility: Sit to Supine Supine to sit: Min assist Sit to supine: Supervision General bed mobility comments: pt up in chair on arrival and at end of session Transfers Overall transfer level: Needs assistance Equipment used: Rolling walker (2 wheels) Transfers: Sit to/from Stand, Bed to chair/wheelchair/BSC Sit to Stand: Min assist, Contact guard assist Bed to/from chair/wheelchair/BSC transfer type:: Step pivot Step pivot transfers: Mod assist General transfer comment: pt with good recall for hand  placement with time, grossly CGA to stand from recliner chair x2 up to RW. min A to maintain balance with serial sit<>stands x7 at end of session with no RW for support Ambulation/Gait Ambulation/Gait assistance: Mod assist, +2 safety/equipment (chair follow) Gait Distance (Feet): 40 Feet (+ 20') Assistive device: Rolling walker (2 wheels) Gait Pattern/deviations: Step-to pattern, Decreased weight shift to right, Decreased stance time - right, Decreased dorsiflexion - right General Gait Details: mod A to maintain balance, pt ambualting with R knee flexed in stance and with narrow BOS, pt able to widen BOS however then externally rotating RLE, with fatigue R knee flexion in stance increasing, chair follow and x1 seated rest Gait velocity: decr Pre-gait activities: Pt engaged in standing weight shifts inside RW with minA. He c/o worsening dizziness. Instructed pt to return to sitting EOB.   ADL: ADL Overall ADL's : Needs assistance/impaired Eating/Feeding: Independent Grooming: Set up, Sitting Upper Body Bathing: Set up, Sitting Lower Body Bathing: Moderate assistance, Sitting/lateral leans Upper Body Dressing : Set up, Sitting Lower Body Dressing: Minimal assistance, Sit to/from stand Lower Body Dressing Details (indicate cue type and reason): pt  able to manage socks, min assist to stand Toilet Transfer: Minimal assistance, Ambulation, Rolling walker (2 wheels) Toilet Transfer Details (indicate cue type and reason): Increased distance min fading to CGA to manage RLE Toileting- Clothing Manipulation and Hygiene: Minimal assistance, Sit to/from stand Functional mobility during ADLs: Minimal assistance, Rolling walker (2 wheels) General ADL Comments: Min assist to stand. Reviewed hemi dressing techniques   Cognition: Cognition Overall Cognitive Status: Impaired/Different from baseline Arousal/Alertness: Awake/alert Orientation Level: Oriented X4 Year: 2024 Day of Week:  Incorrect Attention: Sustained Sustained Attention: Impaired Sustained Attention Impairment: Verbal basic, Functional basic Memory: Impaired Memory Impairment: Storage deficit, Retrieval deficit Awareness: Impaired Awareness Impairment: Intellectual impairment, Emergent impairment, Anticipatory impairment Problem Solving: Impaired Problem Solving Impairment: Verbal basic, Functional basic Executive Function: Sequencing, Organizing Sequencing: Impaired Sequencing Impairment: Verbal basic, Functional basic Organizing: Impaired Organizing Impairment: Verbal basic, Functional basic Safety/Judgment: Impaired Cognition Arousal: Alert Behavior During Therapy: WFL for tasks assessed/performed Overall Cognitive Status: Impaired/Different from baseline   Physical Exam: Blood pressure (!) 103/57, pulse 83, temperature 97.8 F (36.6 C), temperature source Oral, resp. rate 18, height 5' 10 (1.778 m), weight 77.6 kg, SpO2 98%. Physical Exam Constitutional: No apparent distress. Appropriate appearance for age.  HENT: No JVD. Neck Supple. Trachea midline. Atraumatic, normocephalic. +multiple missing teeth Eyes: PERRLA. EOMI. Visual fields grossly intact.  Cardiovascular: RRR, no murmurs/rub/gallops. No Edema. Peripheral pulses 2+  Respiratory: CTAB. No rales, rhonchi, or wheezing. On RA.  Abdomen: +  bowel sounds, normoactive. No distention or tenderness.  Skin: C/D/I. No apparent lesions. PIV intact.  MSK:      No apparent deformity.       Neurologic exam:  Cognition: AAO to person, place, time and event.   + Mild cognitive delay Language: Occasional difficulty wordfinding. No dysarthria.   Insight: Good  insight into current condition.  Mood: Pleasant affect, appropriate mood.  Sensation: To light touch intact in BL UEs and LEs  Reflexes: 2+ in BL UE and LEs. Negative Hoffman's and babinski signs bilaterally.  CN: 2-12 grossly intact.  Coordination: No apparent tremors. No ataxia on  FTN, HTS bilaterally.  Spasticity: MAS 0 in all extremities.  Strength: antigravity and against resistance 5-/5 in all 4 extremities          Lab Results Last 48 Hours        Results for orders placed or performed during the hospital encounter of 04/15/24 (from the past 48 hours)  Glucose, capillary     Status: Abnormal    Collection Time: 04/22/24  4:47 PM  Result Value Ref Range    Glucose-Capillary 152 (H) 70 - 99 mg/dL      Comment: Glucose reference range applies only to samples taken after fasting for at least 8 hours.  Glucose, capillary     Status: Abnormal    Collection Time: 04/22/24  9:23 PM  Result Value Ref Range    Glucose-Capillary 149 (H) 70 - 99 mg/dL      Comment: Glucose reference range applies only to samples taken after fasting for at least 8 hours.    Comment 1 Notify RN      Comment 2 Document in Chart    Basic metabolic panel with GFR     Status: Abnormal    Collection Time: 04/23/24  4:00 AM  Result Value Ref Range    Sodium 139 135 - 145 mmol/L    Potassium 4.4 3.5 - 5.1 mmol/L    Chloride 108 98 - 111 mmol/L    CO2 23 22 - 32 mmol/L    Glucose, Bld 124 (H) 70 - 99 mg/dL      Comment: Glucose reference range applies only to samples taken after fasting for at least 8 hours.    BUN 22 8 - 23 mg/dL    Creatinine, Ser 8.87 0.61 - 1.24 mg/dL    Calcium  8.5 (L) 8.9 - 10.3 mg/dL    GFR, Estimated >39 >39 mL/min      Comment: (NOTE) Calculated using the CKD-EPI Creatinine Equation (2021)      Anion gap 7 5 - 15      Comment: Performed at [facility], [address] [place], [place] [address]  Magnesium      Status: None    Collection Time: 04/23/24  4:00 AM  Result Value Ref Range    Magnesium  1.7 1.7 - 2.4 mg/dL      Comment: Performed at [facility], [address] [place], [place] [address]  Phosphorus     Status: None    Collection Time: 04/23/24  4:00 AM  Result Value Ref Range    Phosphorus 2.6 2.5 - 4.6 mg/dL      Comment:  Performed at [facility], [address], [place], [place] [address]  Glucose, capillary     Status: Abnormal    Collection Time: 04/23/24  6:07 AM  Result Value Ref Range    Glucose-Capillary 123 (H) 70 -  99 mg/dL      Comment: Glucose reference range applies only to samples taken after fasting for at least 8 hours.    Comment 1 Notify RN      Comment 2 Document in Chart    Glucose, capillary     Status: Abnormal    Collection Time: 04/23/24  8:33 AM  Result Value Ref Range    Glucose-Capillary 118 (H) 70 - 99 mg/dL      Comment: Glucose reference range applies only to samples taken after fasting for at least 8 hours.  CBC with Differential/Platelet     Status: Abnormal    Collection Time: 04/23/24  9:24 AM  Result Value Ref Range    WBC 2.5 (L) 4.0 - 10.5 K/uL    RBC 2.91 (L) 4.22 - 5.81 MIL/uL    Hemoglobin 9.0 (L) 13.0 - 17.0 g/dL    HCT 73.5 (L) 60.9 - 52.0 %    MCV 90.7 80.0 - 100.0 fL    MCH 30.9 26.0 - 34.0 pg    MCHC 34.1 30.0 - 36.0 g/dL    RDW 81.1 (H) 88.4 - 15.5 %    Platelets 53 (L) 150 - 400 K/uL      Comment: REPEATED TO VERIFY Immature Platelet Fraction may be clinically indicated, consider ordering this additional test [id number]      nRBC 1.2 (H) 0.0 - 0.2 %    Neutrophils Relative % 56 %    Neutro Abs 1.4 (L) 1.7 - 7.7 K/uL    Lymphocytes Relative 37 %    Lymphs Abs 0.9 0.7 - 4.0 K/uL    Monocytes Relative 4 %    Monocytes Absolute 0.1 0.1 - 1.0 K/uL    Eosinophils Relative 1 %    Eosinophils Absolute 0.0 0.0 - 0.5 K/uL    Basophils Relative 2 %    Basophils Absolute 0.1 0.0 - 0.1 K/uL    WBC Morphology See Note        Comment:  Abnormal Lymphocytes Present  Smudge Cells  Vaculated Neutrophils    Smear Review See Note        Comment:  Normal Platelet Morphology    Schistocytes PRESENT      Burr Cells PRESENT        Comment: Performed at [facility], [address], [place], [place] [address]  Glucose, capillary     Status: Abnormal     Collection Time: 04/23/24 10:58 AM  Result Value Ref Range    Glucose-Capillary 108 (H) 70 - 99 mg/dL      Comment: Glucose reference range applies only to samples taken after fasting for at least 8 hours.  Glucose, capillary     Status: Abnormal    Collection Time: 04/23/24  4:10 PM  Result Value Ref Range    Glucose-Capillary 151 (H) 70 - 99 mg/dL      Comment: Glucose reference range applies only to samples taken after fasting for at least 8 hours.  Glucose, capillary     Status: Abnormal    Collection Time: 04/23/24  9:15 PM  Result Value Ref Range    Glucose-Capillary 115 (H) 70 - 99 mg/dL      Comment: Glucose reference range applies only to samples taken after fasting for at least 8 hours.  Lactate dehydrogenase     Status: None    Collection Time: 04/23/24  9:37 PM  Result Value Ref Range    LDH 227 105 - 235 U/L  Comment: Performed at [facility], [address], [place], [place] [address]  Magnesium      Status: None    Collection Time: 04/24/24  4:03 AM  Result Value Ref Range    Magnesium  2.0 1.7 - 2.4 mg/dL      Comment: Performed at [facility], [address], [place], [place] [address]  Basic metabolic panel with GFR     Status: Abnormal    Collection Time: 04/24/24  4:03 AM  Result Value Ref Range    Sodium 138 135 - 145 mmol/L    Potassium 4.0 3.5 - 5.1 mmol/L    Chloride 107 98 - 111 mmol/L    CO2 24 22 - 32 mmol/L    Glucose, Bld 115 (H) 70 - 99 mg/dL      Comment: Glucose reference range applies only to samples taken after fasting for at least 8 hours.    BUN 20 8 - 23 mg/dL    Creatinine, Ser 9.02 0.61 - 1.24 mg/dL    Calcium  8.4 (L) 8.9 - 10.3 mg/dL    GFR, Estimated >39 >39 mL/min      Comment: (NOTE) Calculated using the CKD-EPI Creatinine Equation (2021)      Anion gap 7 5 - 15      Comment: Performed at [facility], [address], [place], [place] [address]  CBC with Differential/Platelet     Status: Abnormal    Collection  Time: 04/24/24  4:03 AM  Result Value Ref Range    WBC 2.1 (L) 4.0 - 10.5 K/uL    RBC 2.78 (L) 4.22 - 5.81 MIL/uL    Hemoglobin 8.5 (L) 13.0 - 17.0 g/dL    HCT 75.0 (L) 60.9 - 52.0 %    MCV 89.6 80.0 - 100.0 fL    MCH 30.6 26.0 - 34.0 pg    MCHC 34.1 30.0 - 36.0 g/dL    RDW 81.4 (H) 88.4 - 15.5 %    Platelets 57 (L) 150 - 400 K/uL      Comment: REPEATED TO VERIFY Immature Platelet Fraction may be clinically indicated, consider ordering this additional test [id number]      nRBC 1.0 (H) 0.0 - 0.2 %    Neutrophils Relative % 32 %    Neutro Abs 0.7 (L) 1.7 - 7.7 K/uL    Lymphocytes Relative 48 %    Lymphs Abs 1.0 0.7 - 4.0 K/uL    Monocytes Relative 10 %    Monocytes Absolute 0.2 0.1 - 1.0 K/uL    Eosinophils Relative 2 %    Eosinophils Absolute 0.0 0.0 - 0.5 K/uL    Basophils Relative 1 %    Basophils Absolute 0.0 0.0 - 0.1 K/uL    WBC Morphology See Note        Comment: SMUDGE CELLS    Smear Review Normal platelet morphology      Immature Granulocytes 7 %    Abs Immature Granulocytes 0.15 (H) 0.00 - 0.07 K/uL    Schistocytes PRESENT      Polychromasia PRESENT        Comment: Performed at [facility], [address], [place], [place] [address]  Glucose, capillary     Status: Abnormal    Collection Time: 04/24/24  6:13 AM  Result Value Ref Range    Glucose-Capillary 107 (H) 70 - 99 mg/dL      Comment: Glucose reference range applies only to samples taken after fasting  for at least 8 hours.  Glucose, capillary     Status: Abnormal    Collection Time: 04/24/24 11:43 AM  Result Value Ref Range    Glucose-Capillary 170 (H) 70 - 99 mg/dL      Comment: Glucose reference range applies only to samples taken after fasting for at least 8 hours.    Comment 1 Notify RN      Comment 2 Document in Chart        Imaging Results (Last 48 hours)  No results found.         Blood pressure (!) 103/57, pulse 83, temperature 97.8 F (36.6 C), temperature source Oral, resp. rate 18,  height 5' 10 (1.778 m), weight 77.6 kg, SpO2 98%.   Medical Problem List and Plan: 1. Functional deficits secondary to left small ACA and right punctate MCA/ACA infarct etiology likely due to large vessel disease from bilateral ICA stenosis             -patient may  shower             -ELOS/Goals: 10-14 days, SPV PT/OT/SLP   -stable for irf  2.  Antithrombotics: -DVT/anticoagulation:  Mechanical: Antiembolism stockings, thigh (TED hose) Bilateral lower extremities             -antiplatelet therapy: Aspirin  81 mg daily and Plavix  75 mg daily 3. Pain Management: Tylenol  as needed 4. Mood/Behavior/Sleep: Prozac  20 mg daily.             -antipsychotic agents: N/A 5. Neuropsych/cognition: This patient is capable of making decisions on his own behalf. 6. Skin/Wound Care: Routine skin checks 7. Fluids/Electrolytes/Nutrition: Routine and analysis with follow-up chemistries 8.  Symptomatic left carotid artery stenosis.  Status post stenting/angioplasty 04/21/2024 per Dr. Gretta 9.  Hypotension.  ProAmatine  10 mg 3 times daily.  Monitor with increased mobility 10.  Diabetes mellitus.  Hemoglobin A1c 5.6.  Currently on SSI.  Prior to admission patient on Glucophage  500 mg daily.  Resume as needed 11.  Hyperlipidemia.  Lipitor  12.  History of CAD/PCI/CABG/SVT.  Follow-up [name] outpatient 13.  Pancytopenia.  Heme oncology Dr.Pasam outpatient.  Follow-up CBC 14.  Cirrhosis of liver.  Imaging suggestive of cirrhosis.  Bili mildly elevated as well as INR 1.3.  Follow-up outpatient 15.  Urinary retention.  In-N-Out catheterization 12/13 x 1.  Placed on Flomax .  Check PVR 16.  Constipation.  Colace 100 mg daily Toribio JINNY Pitch, PA-C 04/24/2024  I have examined the patient independently and edited the note for HPI, ROS, exam, assessment, and plan as appropriate. I am in agreement with the above recommendations.   Joesph JAYSON Likes, DO 04/24/2024

## 2024-04-24 NOTE — Discharge Instructions (Addendum)
 Inpatient Rehab Discharge Instructions  Christopher Roberts Discharge date and time: No discharge date for patient encounter.   Activities/Precautions/ Functional Status: Activity: As tolerated Diet: Diabetic diet Wound Care: Routine skin checks Functional status:  ___ No restrictions     ___ Walk up steps independently ___ 24/7 supervision/assistance   ___ Walk up steps with assistance ___ Intermittent supervision/assistance  ___ Bathe/dress independently ___ Walk with walker     _x__ Bathe/dress with assistance ___ Walk Independently    ___ Shower independently ___ Walk with assistance    ___ Shower with assistance ___ No alcohol     ___ Return to work/school ________  Special Instructions: No driving smoking or alcohol  Follow-up neurology services [phone] 05/21/2024 for urinary retention/hematuria  COMMUNITY REFERRALS UPON DISCHARGE:    Home Health:   PT    OT     SP   RN                Agency:[facility]     Phone:[phone]    Medical Equipment/Items Ordered: 3 IN 1   & HOSPITAL BED                                                 Agency/Supplier:[facility]   [phone]     My questions have been answered and I understand these instructions. I will adhere to these goals and the provided educational materials after my discharge from the hospital.  Patient/Caregiver Signature _______________________________ Date __________  Clinician Signature _______________________________________ Date __________  Please bring this form and your medication list with you to all your follow-up doctor's appointments. STROKE/TIA DISCHARGE INSTRUCTIONS SMOKING Cigarette smoking nearly doubles your risk of having a stroke & is the single most alterable risk factor  If you smoke or have smoked in the last 12 months, you are advised to quit smoking for your health. Most of the excess cardiovascular risk related to smoking disappears within a year of stopping. Ask you doctor about  anti-smoking medications  Quit Line: 1-800-QUIT NOW Free Smoking Cessation Classes [phone]  CHOLESTEROL Know your levels; limit fat & cholesterol in your diet  Lipid Panel     Component Value Date/Time   CHOL 98 04/22/2024 0308   TRIG 46 04/22/2024 0308   HDL 25 (L) 04/22/2024 0308   CHOLHDL 3.9 04/22/2024 0308   VLDL 9 04/22/2024 0308   LDLCALC 64 04/22/2024 0308     Many patients benefit from treatment even if their cholesterol is at goal. Goal: Total Cholesterol (CHOL) less than 160 Goal:  Triglycerides (TRIG) less than 150 Goal:  HDL greater than 40 Goal:  LDL (LDLCALC) less than 100   BLOOD PRESSURE American Stroke Association blood pressure target is less that 120/80 mm/Hg  Your discharge blood pressure is:  BP: 97/64 Monitor your blood pressure Limit your salt and alcohol intake Many individuals will require more than one medication for high blood pressure  DIABETES (A1c is a blood sugar average for last 3 months) Goal HGBA1c is under 7% (HBGA1c is blood sugar average for last 3 months)  Diabetes: No known diagnosis of diabetes    Lab Results  Component Value Date   HGBA1C 5.6 04/16/2024    Your HGBA1c can be lowered with medications, healthy diet, and exercise. Check your blood sugar as directed by your physician Call your  physician if you experience unexplained or low blood sugars.  PHYSICAL ACTIVITY/REHABILITATION Goal is 30 minutes at least 4 days per week  Activity: Increase activity slowly, Therapies: Physical Therapy: Home Health Return to work:  Activity decreases your risk of heart attack and stroke and makes your heart stronger.  It helps control your weight and blood pressure; helps you relax and can improve your mood. Participate in a regular exercise program. Talk with your doctor about the best form of exercise for you (dancing, walking, swimming, cycling).  DIET/WEIGHT Goal is to maintain a healthy weight  Your discharge diet is:  Diet Order              DIET DYS 3 Room service appropriate? Yes with Assist; Fluid consistency: Thin  Diet effective now                   liquids Your height is:  Height: 5' 10 (177.8 cm) Your current weight is: Weight: 77.1 kg Your Body Mass Index (BMI) is:  BMI (Calculated): 24.39 Following the type of diet specifically designed for you will help prevent another stroke. Your goal weight range is:   Your goal Body Mass Index (BMI) is 19-24. Healthy food habits can help reduce 3 risk factors for stroke:  High cholesterol, hypertension, and excess weight.  RESOURCES Stroke/Support Group:  Call [phone]   STROKE EDUCATION PROVIDED/REVIEWED AND GIVEN TO PATIENT Stroke warning signs and symptoms How to activate emergency medical system (call 911). Medications prescribed at discharge. Need for follow-up after discharge. Personal risk factors for stroke. Pneumonia vaccine given: No Flu vaccine given: No My questions have been answered, the writing is legible, and I understand these instructions.  I will adhere to these goals & educational materials that have been provided to me after my discharge from the hospital.

## 2024-04-24 NOTE — Progress Notes (Signed)
 Physical Therapy Treatment Patient Details Name: Christopher Roberts MRN: [id number] DOB: July 28, 1945 Today's Date: 04/24/2024   History of Present Illness 78 y.o. male admitted 04/15/24 s/p fall with RLE weakness. CTA Head/Neck showed right ICA stenosis >90, left ICA stenosis 60-70%. MRI demonstrated acute punctate CVA. Underwent L TCAR 12/15. PMHx: CAD s/p PCI 2022, T2DM, pancytopenia, HTN, DJD, HLD, MI, and CABGx3. Oncologist worked up for possible MDS.    PT Comments  Pt up in chair on arrival, pleasant and agreeable to session and demonstrating good progress towards acute goals. Pt able to complete transfers st<>stand up to RW with good recall for hand placement and grossly CGA for safety. Pt progressing ambulation with RW for support, mod A to maintain balance and chair follow for safety. Pt continues to be limited in RLE strength and motor control, requiring multimodal cues and hands on assist for RLE placement, R knee extension and sequencing. Pt completing serial sit<>stands at end of session for increased RLE strength with focus on terminal knee extension on final stand. Patient will benefit from intensive inpatient follow-up therapy, >3 hours/day and continues to benefit from skilled PT services to progress toward functional mobility goals.     If plan is discharge home, recommend the following: A lot of help with walking and/or transfers;Assistance with cooking/housework;A little help with bathing/dressing/bathroom;Assist for transportation;Help with stairs or ramp for entrance   Can travel by private vehicle        Equipment Recommendations  Wheelchair (measurements PT);Wheelchair cushion (measurements PT);BSC/3in1    Recommendations for Other Services       Precautions / Restrictions Precautions Precautions: Fall Recall of Precautions/Restrictions: Impaired Restrictions Weight Bearing Restrictions Per Provider Order: No     Mobility  Bed Mobility Overal bed mobility: Needs  Assistance             General bed mobility comments: pt up in chair on arrival and at end of session    Transfers Overall transfer level: Needs assistance Equipment used: Rolling walker (2 wheels) Transfers: Sit to/from Stand, Bed to chair/wheelchair/BSC Sit to Stand: Min assist, Contact guard assist           General transfer comment: pt with good recall for hand placement with time, grossly CGA to stand from recliner chair x2 up to RW. min A to maintain balance with serial sit<>stands x7 at end of session with no RW for support    Ambulation/Gait Ambulation/Gait assistance: Mod assist, +2 safety/equipment (chair follow) Gait Distance (Feet): 40 Feet (+ 20') Assistive device: Rolling walker (2 wheels) Gait Pattern/deviations: Step-to pattern, Decreased weight shift to right, Decreased stance time - right, Decreased dorsiflexion - right Gait velocity: decr     General Gait Details: mod A to maintain balance, pt ambualting with R knee flexed in stance and with narrow BOS, pt able to widen BOS however then externally rotating RLE, with fatigue R knee flexion in stance increasing, chair follow and x1 seated rest   Stairs             Wheelchair Mobility     Tilt Bed    Modified Rankin (Stroke Patients Only) Modified Rankin (Stroke Patients Only) Pre-Morbid Rankin Score: No symptoms Modified Rankin: Moderately severe disability     Balance Overall balance assessment: Needs assistance Sitting-balance support: No upper extremity supported, Feet supported Sitting balance-Leahy Scale: Fair Sitting balance - Comments: sitting up on edge of chair   Standing balance support: Bilateral upper extremity supported Standing balance-Leahy Scale: Poor Standing balance  comment: Reliant on UE support                            Communication Communication Communication: No apparent difficulties  Cognition Arousal: Alert Behavior During Therapy: WFL for tasks  assessed/performed                           PT - Cognition Comments: some diminished attention to R LE, slow processing Following commands: Intact      Cueing Cueing Techniques: Verbal cues, Gestural cues  Exercises General Exercises - Lower Extremity Straight Leg Raises: AROM, Right, Left, 10 reps, Seated (long sitting in chair) Other Exercises Other Exercises: serial sit<>stands x7 with terminal knee extension on R x3 on last stand    General Comments General comments (skin integrity, edema, etc.): VSS on RA      Pertinent Vitals/Pain Pain Assessment Pain Assessment: Faces Faces Pain Scale: Hurts a little bit Pain Location: neck post TCAR Pain Descriptors / Indicators: Sore Pain Intervention(s): Limited activity within patient's tolerance, Monitored during session    Home Living                          Prior Function            PT Goals (current goals can now be found in the care plan section) Acute Rehab PT Goals Patient Stated Goal: Get stronger. PT Goal Formulation: With patient/family Time For Goal Achievement: 05/01/24 Progress towards PT goals: Progressing toward goals    Frequency    Min 3X/week      PT Plan      Co-evaluation              AM-PAC PT 6 Clicks Mobility   Outcome Measure  Help needed turning from your back to your side while in a flat bed without using bedrails?: A Little Help needed moving from lying on your back to sitting on the side of a flat bed without using bedrails?: A Little Help needed moving to and from a bed to a chair (including a wheelchair)?: A Lot Help needed standing up from a chair using your arms (e.g., wheelchair or bedside chair)?: A Little Help needed to walk in hospital room?: A Lot Help needed climbing 3-5 steps with a railing? : Total 6 Click Score: 14    End of Session Equipment Utilized During Treatment: Gait belt Activity Tolerance: Patient tolerated treatment  well;Patient limited by fatigue Patient left: in chair;with call bell/phone within reach;with chair alarm set;with nursing/sitter in room (NT in room) Nurse Communication: Mobility status PT Visit Diagnosis: Other abnormalities of gait and mobility (R26.89);Hemiplegia and hemiparesis;Other symptoms and signs involving the nervous system (R29.898) Hemiplegia - Right/Left: Right Hemiplegia - dominant/non-dominant: Dominant Hemiplegia - caused by: Cerebral infarction     Time: 8875-8858 PT Time Calculation (min) (ACUTE ONLY): 17 min  Charges:    $Gait Training: 8-22 mins PT General Charges $$ ACUTE PT VISIT: 1 Visit                     Vernestine Brodhead R. PTA Acute Rehabilitation Services Office: [phone]   Therisa CHRISTELLA Boor 04/24/2024, 12:18 PM

## 2024-04-24 NOTE — Progress Notes (Signed)
 Christopher Sven SQUIBB, MD  Physician Physical Medicine and Rehabilitation   Consult Note     Signed   Date of Service: 04/23/2024  9:47 AM  Related encounter: ED to Hosp-Admission (Current) from 04/15/2024 in [facility] 4E CV SURGICAL PROGRESSIVE CARE   Signed     Expand All Collapse All  Show:Clear all [x] Written[x] Templated[] Copied  Added by: [x] Raulkar, Sven SQUIBB, MD  [] Hover for details          Physical Medicine and Rehabilitation Consult Reason for Consult: CVA Referring Physician: Meliton Monte, MD     HPI: Christopher Roberts is a 78 y.o. male with PMH significant for CAD s/p PCI in 2022, diabetes mellitus type 2, pancytopenia, who follows with Dr. Autumn with oncology and is being worked up for possible MDS, hypertension, who presented after a fall when trying to get up from bed around 10am when he lost balance and was on the floor for almost an hour before family member found him. He was later noticed to have right lower extremity weakness and was brought to the ED. MRI brain shows acute punctate CVA. Physical Medicine & Rehabilitation was consulted to assess candidacy for CIR.         Home: Home Living Family/patient expects to be discharged to:: Private residence Living Arrangements: Spouse/significant other Available Help at Discharge: Family, Available 24 hours/day Type of Home: House Home Access: Level entry Home Layout: Two level, Bed/bath upstairs, 1/2 bath on main level Alternate Level Stairs-Number of Steps: 12 Alternate Level Stairs-Rails: Left Bathroom Shower/Tub: Health Visitor: Standard Bathroom Accessibility: Yes Home Equipment: Agricultural Consultant (2 wheels), Cane - single point, Rollator (4 wheels), Shower seat - built in, Hand held shower head Additional Comments: Pt lives with wife who is able to assist as needed. Pt has granddaughters who live there too who can help  Lives With: Spouse, Family  Functional History: Prior  Function Prior Level of Function : Independent/Modified Independent Mobility Comments: Mod I using cane, sometimes, RW. About 4 falls in the past 75mo. ADLs Comments: Indep with basic self care. Spouse can help him with medications, prompting him to take it. Drives in the daytime, very seldom. Retired since '05. Functional Status:  Mobility: Bed Mobility Overal bed mobility: Needs Assistance Bed Mobility: Supine to Sit Supine to sit: Mod assist Sit to supine: Min assist General bed mobility comments: difficulty getting R LE off EOB, using rails then with R and posterior lean needing support in sitting and to scoot to EOB Transfers Overall transfer level: Needs assistance Equipment used: Rolling walker (2 wheels) Transfers: Sit to/from Stand, Bed to chair/wheelchair/BSC Sit to Stand: Mod assist Bed to/from chair/wheelchair/BSC transfer type:: Step pivot Step pivot transfers: Mod assist General transfer comment: assist initially for foot placement, cues for hand placement and anterior weight shift, lifting help to stand then with R foot on the other side of L in standing with RW and pt able to step to pivot though mod A for balance and with A for RW managment Ambulation/Gait Ambulation/Gait assistance: Mod assist Gait Distance (Feet): 2 Feet Assistive device: Rolling walker (2 wheels) Gait Pattern/deviations: Step-to pattern, Decreased weight shift to right, Decreased stance time - right, Decreased dorsiflexion - right General Gait Details: would need +2 for safety to ambulate Pre-gait activities: Pt engaged in standing weight shifts inside RW with minA. He c/o worsening dizziness. Instructed pt to return to sitting EOB.   ADL: ADL Overall ADL's : Needs assistance/impaired Eating/Feeding: Independent Grooming: Set up,  Sitting Upper Body Bathing: Set up, Sitting Lower Body Bathing: Moderate assistance, Sitting/lateral leans Upper Body Dressing : Set up, Sitting Lower Body Dressing:  Moderate assistance, Sitting/lateral leans, Sit to/from stand Toilet Transfer: Moderate assistance, Rolling walker (2 wheels), BSC/3in1 Toileting- Clothing Manipulation and Hygiene: Minimal assistance, Sitting/lateral lean General ADL Comments: Pt mod A for STS, RLE weakness limits balance, not able to lift and move it during transfer without increased time. Pt with good BUE strength/ROM.   Cognition: Cognition Overall Cognitive Status: Impaired/Different from baseline Arousal/Alertness: Awake/alert Orientation Level: Oriented X4 Year: 2024 Day of Week: Incorrect Attention: Sustained Sustained Attention: Impaired Sustained Attention Impairment: Verbal basic, Functional basic Memory: Impaired Memory Impairment: Storage deficit, Retrieval deficit Awareness: Impaired Awareness Impairment: Intellectual impairment, Emergent impairment, Anticipatory impairment Problem Solving: Impaired Problem Solving Impairment: Verbal basic, Functional basic Executive Function: Sequencing, Organizing Sequencing: Impaired Sequencing Impairment: Verbal basic, Functional basic Organizing: Impaired Organizing Impairment: Verbal basic, Functional basic Safety/Judgment: Impaired Cognition Arousal: Alert Behavior During Therapy: WFL for tasks assessed/performed Overall Cognitive Status: Impaired/Different from baseline     ROS +mild right sided lower extremity weakness     Past Medical History:  Diagnosis Date   CAD (coronary artery disease)      a. s/p MI in 2005 (symptom of indigestion); b. CABG x 3 in 3/09: L-LAD, S-OM, EF unknown   Colon polyps     Diabetes mellitus     DJD (degenerative joint disease)     HLD (hyperlipidemia)     HTN (hypertension)     MI (myocardial infarction) (HCC) 2005    manifested by indigestion   Murmur      echo 6/12:  Upper septal thickening, no LVOT gradient,, EF 65%, mild LAE               Past Surgical History:  Procedure Laterality Date   CHOLECYSTECTOMY    2002   CORONARY ARTERY BYPASS GRAFT   2009    x3   CORONARY/GRAFT ACUTE MI REVASCULARIZATION N/A 07/25/2020    Procedure: Coronary/Graft Acute MI Revascularization;  Surgeon: Jordan, Peter M, MD;  Location: [facility] INVASIVE CV LAB;  Service: Cardiovascular;  Laterality: N/A;   LEFT HEART CATH AND CORS/GRAFTS ANGIOGRAPHY N/A 07/25/2020    Procedure: LEFT HEART CATH AND CORS/GRAFTS ANGIOGRAPHY;  Surgeon: Jordan, Peter M, MD;  Location: [facility] INVASIVE CV LAB;  Service: Cardiovascular;  Laterality: N/A;   TONSILLECTOMY       TRANSCAROTID ARTERY REVASCULARIZATION  Left 04/21/2024    Procedure: LEFT TRANSCAROTID ARTERY REVASCULARIZATION (TCAR);  Surgeon: [name], MD;  Location: [facility] OR;  Service: Vascular;  Laterality: Left;             Family History  Problem Relation Age of Onset   Colon cancer Mother     Heart attack Mother     High blood pressure Father     Diabetes Father          Social History:  reports that he has quit smoking. His smoking use included cigarettes. He has a 8 pack-year smoking history. He has been exposed to tobacco smoke. He has never used smokeless tobacco. He reports that he does not drink alcohol and does not use drugs. Allergies: [Allergies]  [Allergies] No Known Allergies       Medications Prior to Admission  Medication Sig Dispense Refill   amLODipine  (NORVASC ) 10 MG tablet Take 0.5 tablets (5 mg total) by mouth daily. 60 tablet 2   aspirin  81 MG tablet Take 81 mg  by mouth daily.       atorvastatin  (LIPITOR ) 80 MG tablet Take 1 tablet (80 mg total) by mouth daily at 6 PM. 90 tablet 3   carvedilol  (COREG ) 12.5 MG tablet Take 1 tablet (12.5 mg total) by mouth 2 (two) times daily with a meal. 180 tablet 1   clopidogrel  (PLAVIX ) 75 MG tablet Take 1 tablet (75 mg total) by mouth daily. 90 tablet 1   FLUoxetine  (PROZAC ) 20 MG capsule Take 1 capsule (20 mg total) by mouth daily. 90 capsule 1   metFORMIN  (GLUCOPHAGE -XR) 500 MG 24 hr tablet Take 1 tablet (500  mg total) by mouth daily. 90 tablet 1   nitroGLYCERIN  (NITROSTAT ) 0.4 MG SL tablet Place 1 tablet (0.4 mg total) under the tongue every 5 (five) minutes as needed for chest pain. 25 tablet 1   traZODone (DESYREL) 50 MG tablet Take 50 mg by mouth at bedtime as needed.       meclizine  (ANTIVERT ) 25 MG tablet Take 1 tablet (25 mg total) by mouth 3 (three) times daily as needed. (Patient not taking: Reported on 04/15/2024) 30 tablet 3            Blood pressure (!) 106/59, pulse 66, temperature 98 F (36.7 C), temperature source Oral, resp. rate 16, height 5' 10 (1.778 m), weight 77.6 kg, SpO2 97%. Physical Exam Gen: no distress, normal appearing HEENT: oral mucosa pink and moist, NCAT Cardio: Reg rate Chest: normal effort, normal rate of breathing Abd: soft, non-distended Ext: no edema Psych: pleasant, normal affect Skin: intact Neuro: Alert and oriented x3 Musculoskeletal: 5/5 strength throughout except for 4/5 strength in RLE, sensation is intact.   Lab Results Last 24 Hours       Results for orders placed or performed during the hospital encounter of 04/15/24 (from the past 24 hours)  Glucose, capillary     Status: Abnormal    Collection Time: 04/22/24 11:55 AM  Result Value Ref Range    Glucose-Capillary 229 (H) 70 - 99 mg/dL  Glucose, capillary     Status: Abnormal    Collection Time: 04/22/24  4:47 PM  Result Value Ref Range    Glucose-Capillary 152 (H) 70 - 99 mg/dL  Glucose, capillary     Status: Abnormal    Collection Time: 04/22/24  9:23 PM  Result Value Ref Range    Glucose-Capillary 149 (H) 70 - 99 mg/dL    Comment 1 Notify RN      Comment 2 Document in Chart    Basic metabolic panel with GFR     Status: Abnormal    Collection Time: 04/23/24  4:00 AM  Result Value Ref Range    Sodium 139 135 - 145 mmol/L    Potassium 4.4 3.5 - 5.1 mmol/L    Chloride 108 98 - 111 mmol/L    CO2 23 22 - 32 mmol/L    Glucose, Bld 124 (H) 70 - 99 mg/dL    BUN 22 8 - 23 mg/dL     Creatinine, Ser 8.87 0.61 - 1.24 mg/dL    Calcium  8.5 (L) 8.9 - 10.3 mg/dL    GFR, Estimated >39 >39 mL/min    Anion gap 7 5 - 15  Magnesium      Status: None    Collection Time: 04/23/24  4:00 AM  Result Value Ref Range    Magnesium  1.7 1.7 - 2.4 mg/dL  Phosphorus     Status: None    Collection Time: 04/23/24  4:00 AM  Result Value Ref Range    Phosphorus 2.6 2.5 - 4.6 mg/dL  Glucose, capillary     Status: Abnormal    Collection Time: 04/23/24  6:07 AM  Result Value Ref Range    Glucose-Capillary 123 (H) 70 - 99 mg/dL    Comment 1 Notify RN      Comment 2 Document in Chart    Glucose, capillary     Status: Abnormal    Collection Time: 04/23/24  8:33 AM  Result Value Ref Range    Glucose-Capillary 118 (H) 70 - 99 mg/dL      Imaging Results (Last 48 hours)  DG C-Arm 1-60 Min Result Date: 04/21/2024 EXAM: FLUOROSCOPIC IMAGING TECHNIQUE: Fluoroscopy was provided by the radiology department for procedure. Radiologist was not present during examination. RADIATION DOSE INDEX: Reference Air Kerma: 38.5 mGy COMPARISON: None available. CLINICAL HISTORY: LEFT TRANSCAROTID ARTERY REVASCULARIZATION (TCAR) FINDINGS: Intraoperative fluoroscopic imaging was performed. IMPRESSION: 1. Intraoperative fluoroscopic imaging as above. Please refer to the operative report for full details. Electronically signed by: Dayne Hassell MD 04/21/2024 03:53 PM EST RP Workstation: [id number]        Assessment/Plan: Diagnosis: CVA Does the need for close, 24 hr/day medical supervision in concert with the patient's rehab needs make it unreasonable for this patient to be served in a less intensive setting? 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 Yes Does the patient require coordinated care of a physician, rehab nurse, therapy disciplines of PT, OT to address physical and functional deficits in the context of the above medical diagnosis(es)? Yes Addressing deficits in the following areas: balance, endurance, locomotion, strength, transferring, bowel/bladder control, bathing, dressing, feeding, grooming, toileting, and psychosocial support Can the patient actively participate in an intensive therapy program of at least 3 hrs of therapy per day at least 5 days per week? Yes The potential for patient to make measurable gains while on inpatient rehab is excellent Anticipated functional outcomes upon discharge from inpatient rehab are modified independent  with PT, modified independent with OT, independent with SLP. Estimated rehab length of stay to reach the above functional goals is: 12-16 days Anticipated discharge destination: Home Overall Rehab/Functional Prognosis: excellent   POST ACUTE RECOMMENDATIONS: This patient's condition is appropriate for continued rehabilitative care in the following setting: CIR Patient has agreed to participate in recommended program. Yes Note that insurance prior authorization may be required for reimbursement for recommended care.         I have personally performed a face to face diagnostic evaluation of this patient. Additionally, I have examined the patient's medical record including any pertinent labs and radiographic images.     Thanks,   Sven SHAUNNA Elks, MD 04/23/2024            Routing History

## 2024-04-24 NOTE — Discharge Summary (Signed)
 Physician Discharge Summary  Christopher Roberts FMW:[id number] DOB: May 16, 1945 DOA: 04/15/2024  PCP: de Roberts, Christopher J, MD  Admit date: 04/15/2024 Discharge date: 04/24/2024  Time spent: 40 minutes  Recommendations for Outpatient Follow-up:  Follow outpatient CBC/CMP  Follow with vascular outpatient - plan for staged procedure on the R Follow hypotension - on midodrine  - wean as an outpatient Watch for urinary retention  Pancytopenia, follow with heme/onc outpatient as scheduled for biopsy - pending haptoglobin  Discharge Diagnoses:  Principal Problem:   Acute CVA (cerebrovascular accident) [facility]) Active Problems:   Hyperlipidemia   Essential hypertension   Coronary artery disease due to lipid rich plaque   Diabetes mellitus (HCC)   Cirrhosis of liver (HCC)   Carotid stenosis, left   Discharge Condition: stable  Diet recommendation: heart healthy, diabetic  Filed Weights   04/16/24 1847  Weight: 77.6 kg    History of present illness:   78 yrs old Male with PMH significant for CAD status post PCI in 2022, diabetes mellitus type 2, pancytopenia following up with Dr. Autumn oncologist and being worked up for possible MDS, hypertension who had Christopher Roberts fall on the day of admission when he tried to get up from the bed.  Patient lost balance and fell onto the floor and was on the floor for almost an hour when his family member found him.  Later noticed to have right lower extremity weakness and was brought in the ED.  EMS noted that his right leg was flaccid.    MRI brain showed features concerning for acute punctate stroke.   He's been seen by neurology.  Now s/p L TCAR.  Stable for discharge to CIR.   See below for additional details   Hospital Course:  Assessment and Plan:  Acute CVA: R punctate MCA/ACA infarcts Patient presented with RLE weakness, MRI shows acute punctate stroke in high R frontal and L parasagittal frontal lobes.   Patient was evaluated by Neurology, L small ACA  and R punctate MCA/ACA infarcts, likely due to large vessel disease from bilateral ICA stenosis - recommending DAPT, further regimen per VVS (recommending DAPT).  Lipitor  80 mg. Dysphagia III diet. CTA Head/ Neck showed right ICA stenosis > 90, left ICA stenosis  60 to 70% 2D echo shows LVEF 60 to 65%, LA moderately dilated. LDL 78 Near goal.  Hb A1c 5.9.  Drug screen negative. PT and OT evaluation.  Aggressive risk factor modification. Vascular surgery consulted for bilateral carotid artery stenosis.  Awaiting CIR placement.   Bilateral carotid artery stenosis: Vascular surgery is consulted.  Carotid duplex > R ICA 60-79% stenosis, L ICA 40-59% stenosis  Vascular surgery consulted  Now s/p Left TCAR on 04/21/2024.  Continue DAPT, statin.  Needs staged procedure on R.   Essential hypertension , now Hypotension: Given acute stroke , and low BP  hold antihypertensives - currently on midodrine  (wean as tolerated) Goal SBP 130 - 150    History of CAD status post PCI: S/p LHC 07/2020 with unsucessful PCI of SVG to PDA - at that time DAPT recommended Continue antiplatelet and statins, hold beta-blockers.   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   S/p 1 unit pRBC and 1 unit platelets  Goal platelets over 50,000 in case of active bleeding or interventional procedures.  Schistocytes - haptoglobin pending, LDH wnl   History of SVT : He is being followed by cardiologist. HR controlled.   Diabetes mellitus type 2:  A1c 5.6 Resume metformin  at discharge   Cirrhosis of liver : Imaging  suggestive of cirrhosis Bili elevated, INR mildly elevated, platelets low, albumin  mildly low Needs further outpatient workup    Depression Continue fluoxetine .     Procedures:  12/15 Procedure: 1.  Ultrasound-guided access left common femoral vein for delivery of TCAR venous sheath 2.  Transcatheter placement of left cervical carotid artery stent including angioplasty with flow reversal for distal embolic protection (left TCAR)  Consultations: Vascular neurology  Discharge Exam: Vitals:   04/24/24 0800 04/24/24 1145  BP: 103/60 (!) 103/57  Pulse: 65 83  Resp:  18  Temp:  97.8 F (36.6 C)  SpO2: 96% 98%   See progress noted  Discharge Instructions   Discharge Instructions     Ambulatory referral to Neurology   Complete by: As directed    Follow up with stroke clinic NP at [facility] in about 4-6 weeks. Thanks.   Call MD for:  difficulty breathing, headache or visual disturbances   Complete by: As directed    Call MD for:  extreme fatigue   Complete by: As directed    Call MD for:  hives   Complete by: As directed    Call MD for:  persistant dizziness or light-headedness   Complete by: As directed    Call MD for:  persistant nausea and vomiting   Complete by: As directed    Call MD for:  redness, tenderness, or signs of infection (pain, swelling, redness, odor or green/yellow discharge around incision site)   Complete by: As directed    Call MD for:  severe uncontrolled pain   Complete by: As directed    Call MD for:  temperature >100.4   Complete by: As directed    Discharge instructions   Complete by: As directed    You were seen for Christopher Roberts stroke.  You've now been treated with [name] TCAR procedure by vascular surgery.  You'll continue aspirin  and plavix  in addition to [name] statin (lipitor ).  You'll need to follow up with neurology as an outpatient.  You should also follow up with vascular surgery as an outpatient.  We started you on midodrine  because of your low blood  pressure.  This should be followed as an outpatient and it should be weaned as tolerated.    You have been started on flomax  for urinary retention and presumed enlarged prostate.  Follow this with your outpatient providers.   Return for new, recurrent, or worsening symptoms.  Please ask your PCP to request records from this hospitalization so they know what was done and what the next steps will be.   Discharge wound care:   Complete by: As directed    Per vascular surgery   Increase activity slowly   Complete by: As directed       Allergies as of 04/24/2024   No Known Allergies      Medication List     STOP taking these medications    amLODipine  10 MG tablet Commonly known as: NORVASC    carvedilol  12.5 MG tablet Commonly known as: COREG        TAKE these  medications    aspirin  81 MG tablet Take 81 mg by mouth daily.   atorvastatin  80 MG tablet Commonly known as: LIPITOR  Take 1 tablet (80 mg total) by mouth daily at 6 PM.   clopidogrel  75 MG tablet Commonly known as: PLAVIX  Take 1 tablet (75 mg total) by mouth daily.   FLUoxetine  20 MG capsule Commonly known as: PROZAC  Take 1 capsule (20 mg total) by mouth daily.   meclizine  25 MG tablet Commonly known as: ANTIVERT  Take 1 tablet (25 mg total) by mouth 3 (three) times daily as needed.   metFORMIN  500 MG 24 hr tablet Commonly known as: GLUCOPHAGE -XR Take 1 tablet (500 mg total) by mouth daily.   midodrine  10 MG tablet Commonly known as: PROAMATINE  Take 1 tablet (10 mg total) by mouth 3 (three) times daily with meals.   nitroGLYCERIN  0.4 MG SL tablet Commonly known as: Nitrostat  Place 1 tablet (0.4 mg total) under the tongue every 5 (five) minutes as needed for chest pain.   tamsulosin  0.4 MG Caps capsule Commonly known as: FLOMAX  Take 1 capsule (0.4 mg total) by mouth daily after supper.   traZODone 50 MG tablet Commonly known as: DESYREL Take 50 mg by mouth at bedtime as needed.                Discharge Care Instructions  (From admission, onward)           Start     Ordered   04/24/24 0000  Discharge wound care:       Comments: Per vascular surgery   04/24/24 1401           Allergies[1]  Follow-up Information     [facility]. Schedule an appointment as soon as possible for Natalie Leclaire visit in 1 month(s).   Specialty: Neurology Why: stroke clinic Contact information: [address] [place]  [address] [phone]        [name], MD Follow up in 4 week(s).   Specialty: Vascular Surgery Why: Office will call you to arrange your appt (sent). Contact information: [address] [place] [address] [phone]                  The results of significant diagnostics from this hospitalization (including imaging, microbiology, ancillary and laboratory) are listed below for reference.    Significant Diagnostic Studies: DG C-Arm 1-60 Min Result Date: 04/21/2024 EXAM: FLUOROSCOPIC IMAGING TECHNIQUE: Fluoroscopy was provided by the radiology department for procedure. Radiologist was not present during examination. RADIATION DOSE INDEX: Reference Air Kerma: 38.5 mGy COMPARISON: None available. CLINICAL HISTORY: LEFT TRANSCAROTID ARTERY REVASCULARIZATION (TCAR) FINDINGS: Intraoperative fluoroscopic imaging was performed. IMPRESSION: 1. Intraoperative fluoroscopic imaging as above. Please refer to the operative report for full details. Electronically signed by: Katheleen Faes MD 04/21/2024 03:53 PM EST RP Workstation: [id number]   VAS US  CAROTID Result Date: 04/19/2024 Carotid Arterial Duplex Study Patient Name:  Christopher Roberts  Date of Exam:   04/17/2024 Medical Rec #: [id number]       Accession #:    [id number] Date of Birth: 03-04-1946       Patient Gender: M Patient Age:   60 years Exam Location:  [facility] Procedure:      VAS US  CAROTID Referring Phys: LUCIE RHYNE  --------------------------------------------------------------------------------  Indications:       CVA and Carotid artery disease. Risk Factors:      Hypertension, hyperlipidemia, Diabetes, past history of  smoking, prior MI, coronary artery disease. Comparison Study:  No previous carotid duplex exams. CTA on 04/26/2024 showed RT                    ICA >90% & LT ICA 60-70% Performing Technologist: Jody Hill RVT, RDMS  Examination Guidelines: Treydon Henricks complete evaluation includes B-mode imaging, spectral Doppler, color Doppler, and power Doppler as needed of all accessible portions of each vessel. Bilateral testing is considered an integral part of Shalla Bulluck complete examination. Limited examinations for reoccurring indications may be performed as noted.  Right Carotid Findings: +----------+--------+--------+--------+---------------------+------------------+           PSV cm/sEDV cm/sStenosisPlaque Description   Comments           +----------+--------+--------+--------+---------------------+------------------+ CCA Prox  85      13                                                      +----------+--------+--------+--------+---------------------+------------------+ CCA Mid                           heterogenous                            +----------+--------+--------+--------+---------------------+------------------+ CCA Distal98      17              heterogenous         intimal thickening +----------+--------+--------+--------+---------------------+------------------+ ICA Prox  314     91      60-79%  calcific, diffuse and                                                     irregular                               +----------+--------+--------+--------+---------------------+------------------+ ICA Mid   252     58                                                      +----------+--------+--------+--------+---------------------+------------------+ ICA Distal83       23                                                      +----------+--------+--------+--------+---------------------+------------------+ ECA       129     0               calcific                                +----------+--------+--------+--------+---------------------+------------------+ +----------+--------+-------+----------------+-------------------+           PSV cm/sEDV cmsDescribe        Arm Pressure (mmHG) +----------+--------+-------+----------------+-------------------+ Subclavian203  Multiphasic, WNL                    +----------+--------+-------+----------------+-------------------+ +---------+--------+--+--------+--+---------+ VertebralPSV cm/s64EDV cm/s13Antegrade +---------+--------+--+--------+--+---------+  Left Carotid Findings: +----------+--------+--------+--------+-------------------------------+--------+           PSV cm/sEDV cm/sStenosisPlaque Description             Comments +----------+--------+--------+--------+-------------------------------+--------+ CCA Prox  106     20                                                      +----------+--------+--------+--------+-------------------------------+--------+ CCA Mid                           calcific and heterogenous               +----------+--------+--------+--------+-------------------------------+--------+ CCA Distal141     31                                                      +----------+--------+--------+--------+-------------------------------+--------+ ICA Prox  191     56      40-59%  calcific, irregular and diffuse         +----------+--------+--------+--------+-------------------------------+--------+ ICA Mid   124     39                                                      +----------+--------+--------+--------+-------------------------------+--------+ ICA Distal97      29                                                       +----------+--------+--------+--------+-------------------------------+--------+ ECA       80                      calcific and heterogenous               +----------+--------+--------+--------+-------------------------------+--------+ +----------+--------+--------+----------------+-------------------+           PSV cm/sEDV cm/sDescribe        Arm Pressure (mmHG) +----------+--------+--------+----------------+-------------------+ [id number]             Multiphasic, WNL                    +----------+--------+--------+----------------+-------------------+ +---------+--------+--+--------+--+---------+ VertebralPSV cm/s51EDV cm/s17Antegrade +---------+--------+--+--------+--+---------+   Summary: Right Carotid: Velocities in the right ICA are consistent with Marjo Grosvenor 60-79% stenosis                (upper end of range). Left Carotid: Velocities in the left ICA are consistent with Ceili Boshers 40-59% stenosis               (upper end of range). Vertebrals:  Bilateral vertebral arteries demonstrate antegrade flow. Subclavians: Normal flow hemodynamics were seen in bilateral subclavian              arteries. *See table(s) above for measurements  and observations.  Electronically signed by Eather Popp MD on 04/19/2024 at 11:29:45 AM.    Final    ECHOCARDIOGRAM COMPLETE Result Date: 04/16/2024    ECHOCARDIOGRAM REPORT   Patient Name:   Christopher Roberts Date of Exam: 04/16/2024 Medical Rec #:  [id number]      Height:       69.0 in Accession #:    [id number]     Weight:       172.4 lb Date of Birth:  1945-10-20      BSA:          1.940 m Patient Age:    78 years       BP:           138/67 mmHg Patient Gender: M              HR:           74 bpm. Exam Location:  Inpatient Procedure: 2D Echo, Cardiac Doppler and Color Doppler (Both Spectral and Color            Flow Doppler were utilized during procedure). Indications:    Stroke 163.9  History:        Patient has prior history of Echocardiogram examinations, most                  recent 07/26/2020. CAD and Previous Myocardial Infarction,                 Stroke, Signs/Symptoms:Chest Pain and Shortness of Breath; Risk                 Factors:Dyslipidemia, Hypertension, Diabetes and Former Smoker.  Sonographer:    Merlynn Argyle Referring Phys: [phone] REDIA LOISE CLEAVER  Sonographer Comments: Image acquisition challenging due to respiratory motion. IMPRESSIONS  1. Left ventricular ejection fraction, by estimation, is 60 to 65%. The left ventricle has normal function. The left ventricle has no regional wall motion abnormalities. There is mild asymmetric left ventricular hypertrophy of the basal-septal segment. Left ventricular diastolic parameters are consistent with Grade I diastolic dysfunction (impaired relaxation).  2. Right ventricular systolic function is normal. The right ventricular size is mildly enlarged. There is normal pulmonary artery systolic pressure. The estimated right ventricular systolic pressure is 16.1 mmHg.  3. Left atrial size was moderately dilated.  4. The mitral valve is degenerative. Mild mitral valve regurgitation. No evidence of mitral stenosis. Moderate mitral annular calcification.  5. The aortic valve is tricuspid. There is mild calcification of the aortic valve. There is mild thickening of the aortic valve. Aortic valve regurgitation is trivial. Aortic valve sclerosis is present, with no evidence of aortic valve stenosis.  6. The inferior vena cava is normal in size with greater than 50% respiratory variability, suggesting right atrial pressure of 3 mmHg. Conclusion(s)/Recommendation(s): No intracardiac source of embolism detected on this transthoracic study. Consider Diron Haddon transesophageal echocardiogram to exclude cardiac source of embolism if clinically indicated. FINDINGS  Left Ventricle: Left ventricular ejection fraction, by estimation, is 60 to 65%. The left ventricle has normal function. The left ventricle has no regional wall motion abnormalities. The  left ventricular internal cavity size was normal in size. There is  mild asymmetric left ventricular hypertrophy of the basal-septal segment. Left ventricular diastolic parameters are consistent with Grade I diastolic dysfunction (impaired relaxation). Right Ventricle: The right ventricular size is mildly enlarged. No increase in right ventricular wall thickness. Right ventricular systolic function is normal. There is normal pulmonary artery systolic pressure.  The tricuspid regurgitant velocity is 1.81  m/s, and with an assumed right atrial pressure of 3 mmHg, the estimated right ventricular systolic pressure is 16.1 mmHg. Left Atrium: Left atrial size was moderately dilated. Right Atrium: Right atrial size was normal in size. Pericardium: There is no evidence of pericardial effusion. Mitral Valve: The mitral valve is degenerative in appearance. There is mild thickening of the mitral valve leaflet(s). There is mild calcification of the mitral valve leaflet(s). Moderate mitral annular calcification. Mild mitral valve regurgitation. No evidence of mitral valve stenosis. Tricuspid Valve: The tricuspid valve is normal in structure. Tricuspid valve regurgitation is trivial. No evidence of tricuspid stenosis. Aortic Valve: The aortic valve is tricuspid. There is mild calcification of the aortic valve. There is mild thickening of the aortic valve. Aortic valve regurgitation is trivial. Aortic valve sclerosis is present, with no evidence of aortic valve stenosis. Pulmonic Valve: The pulmonic valve was normal in structure. Pulmonic valve regurgitation is trivial. No evidence of pulmonic stenosis. Aorta: The aortic root is normal in size and structure. Venous: The inferior vena cava is normal in size with greater than 50% respiratory variability, suggesting right atrial pressure of 3 mmHg. IAS/Shunts: No atrial level shunt detected by color flow Doppler.  LEFT VENTRICLE PLAX 2D LVIDd:         4.60 cm   Diastology LVIDs:          3.40 cm   LV e' medial:    4.66 cm/s LV PW:         1.00 cm   LV E/e' medial:  19.7 LV IVS:        1.30 cm   LV e' lateral:   15.50 cm/s LVOT diam:     2.20 cm   LV E/e' lateral: 5.9 LV SV:         108 LV SV Index:   56 LVOT Area:     3.80 cm  RIGHT VENTRICLE RV Basal diam:  4.30 cm RV Mid diam:    3.50 cm RV S prime:     21.80 cm/s TAPSE (M-mode): 1.5 cm LEFT ATRIUM             Index        RIGHT ATRIUM           Index LA diam:        4.00 cm 2.06 cm/m   RA Area:     19.00 cm LA Vol (A2C):   71.7 ml 36.97 ml/m  RA Volume:   51.60 ml  26.60 ml/m LA Vol (A4C):   96.7 ml 49.85 ml/m LA Biplane Vol: 87.0 ml 44.85 ml/m  AORTIC VALVE LVOT Vmax:   127.00 cm/s LVOT Vmean:  94.800 cm/s LVOT VTI:    0.285 m  AORTA Ao Root diam: 3.70 cm Ao Asc diam:  3.70 cm MITRAL VALVE               TRICUSPID VALVE MV Area (PHT): 2.55 cm    TR Peak grad:   13.1 mmHg MV Decel Time: 297 msec    TR Vmax:        181.00 cm/s MV E velocity: 92.00 cm/s MV Faylynn Stamos velocity: 90.30 cm/s  SHUNTS MV E/Anjalina Bergevin ratio:  1.02        Systemic VTI:  0.29 m                            Systemic Diam: 2.20  cm Oneil Parchment MD Electronically signed by Oneil Parchment MD Signature Date/Time: 04/16/2024/11:50:06 AM    Final    CT ANGIO HEAD NECK W WO CM Result Date: 04/16/2024 EXAM: CTA HEAD AND NECK WITHOUT AND WITH 04/16/2024 05:41:14 AM TECHNIQUE: CTA of the head and neck was performed without and with the administration of 75 mL of iohexol  (OMNIPAQUE ) 350 MG/ML injection. Multiplanar 2D and/or 3D reformatted images are provided for review. Automated exposure control, iterative reconstruction, and/or weight based adjustment of the mA/kV was utilized to reduce the radiation dose to as low as reasonably achievable. Stenosis of the internal carotid arteries measured using NASCET criteria. COMPARISON: CT of the head dated 04/15/2024 CLINICAL HISTORY: Stroke/TIA, determine embolic source. FINDINGS: CTA NECK: AORTIC ARCH AND ARCH VESSELS: No dissection or arterial injury.  No significant stenosis of the brachiocephalic or subclavian arteries. There is calcific atheromatous disease within the aortic arch. CERVICAL CAROTID ARTERIES: No dissection or arterial injury. There is moderate calcific plaque within the carotid bulbs and origins of the internal carotid arteries bilaterally. There is near occlusive stenosis of the proximal right internal carotid artery with estimated stenosis 90% or greater. There is extensive calcific plaque within the proximal left internal carotid artery with estimated stenosis approximately 60 to 70%. CERVICAL VERTEBRAL ARTERIES: No dissection, arterial injury, or significant stenosis. LUNGS AND MEDIASTINUM: Unremarkable. SOFT TISSUES: No acute abnormality. BONES: No acute abnormality. CTA HEAD: ANTERIOR CIRCULATION: There is moderately advanced calcific atheromatous disease within the carotid siphons. There is moderate stenosis of the cavernous and supraclinoid segments, approximately 40 to 50% bilaterally. No significant stenosis of the anterior cerebral arteries. No significant stenosis of the middle cerebral arteries. No aneurysm. POSTERIOR CIRCULATION: There is moderate-to-severe stenosis of the P2 segment of the left posterior cerebral artery and there is moderate stenosis of the P2 segment of the right posterior cerebral artery. The focal stenoses are demonstrated on images 104 and 106 of series 9, respectively. No significant stenosis of the basilar artery. There is moderately advanced calcific atheromatous disease within the vertebral arteries. No aneurysm. OTHER: There is age-related atrophy and moderately advanced cerebral white matter disease. Chronic encephalomalacia changes are noted within the frontal and parietal lobes bilaterally from remote cortical infarcts. Chronic infarcts are also noted within the cerebellar hemispheres bilaterally. No dural venous sinus thrombosis on this non-dedicated study. IMPRESSION: 1. Near occlusive stenosis of the  proximal right internal carotid artery with estimated stenosis 90% or greater. 2. Extensive calcific plaque within the proximal left internal carotid artery with estimated stenosis approximately 60 to 70%. 3. Moderate-to-severe stenosis of the P2 segment of the left posterior cerebral artery and moderate stenosis of the P2 segment of the right posterior cerebral artery. 4. Moderate stenosis of the cavernous and supraclinoid segments, approximately 40 to 50% bilaterally. Electronically signed by: Evalene Coho MD 04/16/2024 06:14 AM EST RP Workstation: [id number]   MR BRAIN WO CONTRAST Result Date: 04/15/2024 EXAM: MRI BRAIN WITHOUT CONTRAST 04/15/2024 07:46:35 PM TECHNIQUE: Multiplanar multisequence MRI of the head/brain was performed without the administration of intravenous contrast. COMPARISON: CT head from earlier today. MRI head 08/30/2016. CLINICAL HISTORY: Neuro deficit, acute, stroke suspected FINDINGS: BRAIN AND VENTRICLES: Punctate acute infarcts in the high right frontal and left parasagittal frontal lobes (series 2, images 39 and 37). No intracranial hemorrhage. No mass. No midline shift. No hydrocephalus. Advanced T2 hyperintensity in the white matter, compatible with chronic avascular ischemic disease. Remote lacunar infarcts in the corona radiata and bilateral parietal cortex. Multiple remote cerebellar infarcts bilaterally.  Normal flow voids. ORBITS: No acute abnormality. SINUSES AND MASTOIDS: No acute abnormality. BONES AND SOFT TISSUES: Normal marrow signal. No acute soft tissue abnormality. IMPRESSION: 1. Punctate acute infarcts in the high right frontal and left parasagittal frontal lobes. 2. 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 Electronically signed by: Gilmore Molt MD 04/15/2024 09:43 PM EST RP Workstation: [id number]   CT HEAD WO CONTRAST Result Date: 04/15/2024 CLINICAL DATA:  Clemens out of bed, neurologic deficit EXAM: CT HEAD  WITHOUT CONTRAST TECHNIQUE: Contiguous axial images were obtained from the base of the skull through the vertex without intravenous contrast. RADIATION DOSE REDUCTION: This exam was performed according to the departmental dose-optimization program which includes automated exposure control, adjustment of the mA and/or kV according to patient size and/or use of iterative reconstruction technique. COMPARISON:  08/31/2016 FINDINGS: Brain: There are chronic cortical infarcts along the bilateral frontal and parietal convexities, as well as within the bilateral cerebellar hemispheres. Chronic small vessel ischemic changes are seen throughout the periventricular and subcortical white matter. No evidence of acute infarct or hemorrhage. Dense bilateral basal ganglia calcifications are again noted. Lateral ventricles and midline structures are otherwise unremarkable. No acute extra-axial fluid collections. No mass effect. Vascular: No hyperdense vessel or unexpected calcification. Skull: Normal. Negative for fracture or focal lesion. Sinuses/Orbits: No acute finding. Other: None. IMPRESSION: 1. No acute intracranial process. 2. Chronic ischemic changes as above. Electronically Signed   By: Ozell Daring M.D.   On: 04/15/2024 17:50    Microbiology: Recent Results (from the past 240 hours)  Surgical pcr screen     Status: None   Collection Time: 04/20/24  1:54 PM   Specimen: Nasal Mucosa; Nasal Swab  Result Value Ref Range Status   MRSA, PCR NEGATIVE NEGATIVE Final   Staphylococcus aureus NEGATIVE NEGATIVE Final    Comment: (NOTE) The Xpert SA Assay (FDA approved for NASAL specimens in patients 50 years of age and older), is one component of Zanyla Klebba comprehensive surveillance program. It is not intended to diagnose infection nor to guide or monitor treatment. Performed at [facility], [address] [place], [place] [address]      Labs: Basic Metabolic Panel: Recent Labs  Lab 04/18/24 0225  04/20/24 9361 04/21/24 0404 04/22/24 0308 04/23/24 0400 04/24/24 0403  NA 134* 138 139 141 139 138  K 3.4* 3.6 3.8 4.1 4.4 4.0  CL 107 111 109 108 108 107  CO2 21* 18* 22 21* 23 24  GLUCOSE 122* 111* 105* 204* 124* 115*  BUN 18 16 18 21 22 20   CREATININE 1.22 1.13 1.14 1.26* 1.12 0.97  CALCIUM  7.4* 8.3* 8.1* 8.4* 8.5* 8.4*  MG 1.9 1.6*  --  1.9 1.7 2.0  PHOS 3.1 3.2  --  2.7 2.6  --    Liver Function Tests: Recent Labs  Lab 04/22/24 0308  AST 35  ALT 21  ALKPHOS 92  BILITOT 1.3*  PROT 6.0*  ALBUMIN  2.6*   No results for input(s): LIPASE, AMYLASE in the last 168 hours. No results for input(s): AMMONIA in the last 168 hours. CBC: Recent Labs  Lab 04/20/24 0638 04/21/24 0404 04/22/24 0308 04/23/24 0924 04/24/24 0403  WBC 1.9* 2.4* 2.0* 2.5* 2.1*  NEUTROABS  --   --  1.0* 1.4* 0.7*  HGB 8.4* 8.1* 8.4* 9.0* 8.5*  HCT 23.8* 23.4* 23.9* 26.4* 24.9*  MCV 86.9 87.3 87.9 90.7 89.6  PLT 49* 49* 50* 53* 57*   Cardiac Enzymes: No  results for input(s): CKTOTAL, CKMB, CKMBINDEX, TROPONINI in the last 168 hours. BNP: BNP (last 3 results) No results for input(s): BNP in the last 8760 hours.  ProBNP (last 3 results) No results for input(s): PROBNP in the last 8760 hours.  CBG: Recent Labs  Lab 04/23/24 1058 04/23/24 1610 04/23/24 2115 04/24/24 0613 04/24/24 1143  GLUCAP 108* 151* 115* 107* 170*       Signed:  Meliton Monte MD.  [facility] 04/24/2024, 2:01 PM       [1] No Known Allergies

## 2024-04-24 NOTE — PMR Pre-admission (Signed)
 PMR Admission Coordinator Pre-Admission Assessment  Patient: Christopher Roberts is an 78 y.o., male MRN: [id number] DOB: 17-Mar-1946 Height: 5' 10 (177.8 cm) Weight: 77.6 kg  Insurance Information HMO:     PPO:      PCP:      IPA:      80/20:      OTHER: POS plan PRIMARY: UHC Medicare      Policy#: [id number]; Medicare [id number]      Subscriber: pt CM Name: Eleanor      Phone#: [phone] option 3     Fax#: [phone] Pre-Cert#: [id number] Auth for CIR from Melissa with [facility] for [facility] medicare for admit 12/18 with next review date 12/24.  Updates due to general fax listed above.        Employer:  Benefits:  Phone #: [phone]     Name: 12/17 Eff. Date: 05/09/23 active     Deduct: none      Out of Pocket Max: $4900      Life Max: none CIR: $325 co pay per day days 1 until 5      SNF: no co pay per day days 1 until 20; $203 co pay per day days 21 until; 100 Outpatient: $20 per visit     Co-Pay:  Home Health: 100%      Co-Pay: none DME: 80%     Co-Pay: 20% Providers: in network  SECONDARY: none      Policy#:      Phone#:   Artist:       Phone#:   The Best Boy for patients in Inpatient Rehabilitation Facilities with attached Privacy Act Statement-Health Care Records was provided and verbally reviewed with: Patient and Family  Emergency Contact Information Contact Information     Name Relation Home Work Mobile   [place] Spouse   [phone]      Other Contacts   None on File    Current Medical History  Patient Admitting Diagnosis: CVA  History of Present Illness: Christopher Roberts is a 78 year old right-handed male with history significant for diabetes mellitus, hypertension, hyperlipidemia, 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, tobacco use, pancytopenia followed by oncology [name] and being worked up for possible MDS.   Presented 04/15/2024 after a fall when he tried to get up from bed around 10 AM.   He lost his balance and fell onto the floor and laid there for approxi-1 hour before being found by family member.  Family reported some right lower extremity weakness.    CT/MRI showed punctate acute infarct in the high right frontal and left parasagittal frontal lobes.  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  CTA showed near occlusive stenosis of the proximal right internal carotid artery with estimated stenosis 90% or greater.  Extensive calcified plaque within the proximal left internal carotid artery with estimated stenosis approximately 60 to 70%.  Moderate to severe stenosis of the P2 segment of the left posterior cerebral artery and moderate stenosis of the P2 segment of the right posterior cerebral artery.  Admission chemistries unremarkable except hemoglobin 10.3 RBCs 3.38, WBC 2.1, platelets 52,000, chloride 112, CO2 21, glucose 126, urine drug screen negative, hemoglobin A1c 5.6, total bilirubin 1.4-1.9, INR 1.3.  Echocardiogram with ejection fraction of 60 to 65% no wall motion abnormalities.    Follow-up vascular surgery Dr. Lonni Gaskins for symptomatic left ICA stenosis 60 to 70% underwent trans catheter placement of left cervical carotid artery stenting angioplasty  04/21/2024.  Patient currently maintained on aspirin  81 mg daily and Plavix  75 mg daily for CVA prophylaxis.  Hospital course with bouts of hypotension currently on ProAmatine  and beta-blocker held.  Patient did have 1 episode of urinary retention requiring an In-N-Out catheterization with 1 L retained 12/13 without recurrence and placed on Flomax .  Findings of mildly elevated bilirubin imaging suggestive of mild cirrhosis plan outpatient workup.   Complete NIHSS TOTAL: 0  Patient's medical record [facility] has been reviewed by the rehabilitation admission coordinator and physician.  Past Medical History  Past Medical History:  Diagnosis Date   CAD (coronary artery  disease)    a. s/p MI in 2005 (symptom of indigestion); b. 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 Yes  Family History   family history includes Colon cancer in his mother; Diabetes in his father; Heart attack in his mother; High blood pressure in his father.  Current Medications Current Medications[1]  Patients Current Diet:  Diet Order             DIET DYS 3 Room service appropriate? Yes with Assist; Fluid consistency: Thin  Diet effective now                  Precautions / Restrictions Precautions Precautions: Fall Precaution/Restrictions Comments: BP goal 140-160 before carotid revascularization Restrictions Weight Bearing Restrictions Per Provider Order: No   Has the patient had 2 or more falls or a fall with injury in the past year? Yes  Prior Activity Level Limited Community (1-2x/wk): mod I with cane and sometimes RW  Prior Functional Level Self Care: Did the patient need help bathing, dressing, using the toilet or eating? Independent  Indoor Mobility: Did the patient need assistance with walking from room to room (with or without device)? Independent  Stairs: Did the patient need assistance with internal or external stairs (with or without device)? Independent  Functional Cognition: Did the patient need help planning regular tasks such as shopping or remembering to take medications? Independent  Patient Information Are you of Hispanic, Latino/a,or Spanish origin?: A. No, not of Hispanic, Latino/a, or Spanish origin What is your race?: B. Black or African American Do you need or want an interpreter to  communicate with a doctor or health care staff?: 0. No  Patient's Response To:  Health Literacy and Transportation Is the patient able to respond to health literacy and transportation needs?: Yes Health Literacy - How often do you need to have someone help you when you read instructions, pamphlets, or other written material from your doctor or pharmacy?: Sometimes In the past 12 months, has lack of transportation kept you from medical appointments or from getting medications?: No In the past 12 months, has lack of transportation kept you from meetings, work, or from getting things needed for daily living?: No  Home Assistive Devices / Equipment Home Equipment: [employer] (2 wheels), [employer] - single point, Rollator (4 wheels), Shower seat - built in, [employer] held shower head  Prior Device Use: Indicate devices/aids used by the patient  prior to current illness, exacerbation or injury? Cane and RW  Current Functional Level Cognition  Arousal/Alertness: Awake/alert Overall Cognitive Status: Impaired/Different from baseline Orientation Level: Oriented X4 Attention: Sustained Sustained Attention: Impaired Sustained Attention Impairment: Verbal basic, Functional basic Memory: Impaired Memory Impairment: Storage deficit, Retrieval deficit Awareness: Impaired Awareness Impairment: Intellectual impairment, Emergent impairment, Anticipatory impairment Problem Solving: Impaired Problem Solving Impairment: Verbal basic, Functional basic Executive Function: Sequencing, Organizing Sequencing: Impaired Sequencing Impairment: Verbal basic, Functional basic Organizing: Impaired Organizing Impairment: Verbal basic, Functional basic Safety/Judgment: Impaired    Extremity Assessment (includes Sensation/Coordination)  Upper Extremity Assessment: Overall WFL for tasks assessed  Lower Extremity Assessment: Defer to PT evaluation RLE Deficits / Details: Hip strength 3+/5. Knee and Ankle strength  4/5. RLE Sensation: WNL RLE Coordination: decreased gross motor (Pt with difficulty performing heel-to-shin test and tapping toes for RAMs)    ADLs  Overall ADL's : Needs assistance/impaired Eating/Feeding: Independent Grooming: Set up, Sitting Upper Body Bathing: Set up, Sitting Lower Body Bathing: Moderate assistance, Sitting/lateral leans Upper Body Dressing : Set up, Sitting Lower Body Dressing: Minimal assistance, Sit to/from stand Lower Body Dressing Details (indicate cue type and reason): pt  able to manage socks, min assist to stand Toilet Transfer: Minimal assistance, Ambulation, Rolling walker (2 wheels) Toilet Transfer Details (indicate cue type and reason): Increased distance min fading to CGA to manage RLE Toileting- Clothing Manipulation and Hygiene: Minimal assistance, Sit to/from stand Functional mobility during ADLs: Minimal assistance, Rolling walker (2 wheels) General ADL Comments: Min assist to stand. Reviewed hemi dressing techniques    Mobility  Overal bed mobility: Needs Assistance Bed Mobility: Sit to Supine Supine to sit: Min assist Sit to supine: Supervision General bed mobility comments: pt up in chair on arrival and at end of session    Transfers  Overall transfer level: Needs assistance Equipment used: Rolling walker (2 wheels) Transfers: Sit to/from Stand, Bed to chair/wheelchair/BSC Sit to Stand: Min assist, Contact guard assist Bed to/from chair/wheelchair/BSC transfer type:: Step pivot Step pivot transfers: Mod assist General transfer comment: pt with good recall for hand placement with time, grossly CGA to stand from recliner chair x2 up to RW. min A to maintain balance with serial sit<>stands x7 at end of session with no RW for support    Ambulation / Gait / Stairs / Wheelchair Mobility  Ambulation/Gait Ambulation/Gait assistance: Mod assist, +2 safety/equipment (chair follow) Gait Distance (Feet): 40 Feet (+ 20') Assistive device: Rolling  walker (2 wheels) Gait Pattern/deviations: Step-to pattern, Decreased weight shift to right, Decreased stance time - right, Decreased dorsiflexion - right General Gait Details: mod A to maintain balance, pt ambualting with R knee flexed in stance and with narrow BOS, pt able to widen BOS however then externally rotating RLE, with fatigue R knee flexion in stance increasing, chair follow and x1 seated rest Gait velocity: decr Pre-gait activities: Pt engaged in standing weight shifts inside RW with minA. He c/o worsening dizziness. Instructed pt to return to sitting EOB.    Posture / Balance Dynamic Sitting Balance Sitting balance - Comments: sitting up on edge of chair Balance Overall balance assessment: Needs assistance Sitting-balance support: No upper extremity supported, Feet supported Sitting balance-Leahy Scale: Fair Sitting balance - Comments: sitting up on edge of chair Standing balance support: Bilateral upper extremity supported Standing balance-Leahy Scale: Poor Standing balance comment: Reliant on UE support    Special considerations/life events     Previous Home Environment  Living Arrangements: Spouse/significant other, Other (Comment) (raising grandchildren)  Lives With: Spouse, Family Available Help at Discharge: Family, Available 24 hours/day Type of Home: House Home Layout: Two level, Bed/bath upstairs, 1/2 bath on main level Alternate Level Stairs-Rails: Left Alternate Level Stairs-Number of Steps: 12 Home Access: Level entry Bathroom Shower/Tub: Health Visitor: Standard Bathroom Accessibility: Yes How Accessible: Accessible via walker Home Care Services: No Additional Comments: Pt lives with wife who is able to assist as needed. Pt has granddaughters who live there too who can help 5 grandchildren in the home, 12, 66 and 3 under 71 years of age  Discharge Living Setting Plans for Discharge Living Setting: Patient's home, House, Lives with  (comment) (wife and grandchildren) Type of Home at Discharge: House Discharge Home Layout: 1/2 bath on main level, Two level, Bed/bath upstairs Discharge Home Access: Level entry Discharge Bathroom Shower/Tub: Walk-in shower Discharge Bathroom Toilet: Standard Discharge Bathroom Accessibility: Yes How Accessible: Accessible via walker Does the patient have any problems obtaining your medications?: No  Social/Family/Support Systems Patient Roles: Spouse, Caregiver Contact Information: wife Anticipated Caregiver: wife and family Anticipated Caregiver's Contact Information: see contacts Ability/Limitations of Caregiver: no limitations Caregiver Availability: 24/7 Discharge Plan Discussed with Primary Caregiver: Yes Is Caregiver In Agreement with Plan?: Yes Does Caregiver/Family have Issues with Lodging/Transportation while Pt is in Rehab?: No  Goals Patient/Family Goal for Rehab: supervision PT, OT and SLP Expected length of stay: ELOS 10 to 14 days Pt/Family Agrees to Admission and willing to participate: Yes Program Orientation Provided & Reviewed with Pt/Caregiver Including Roles  & Responsibilities: Yes  Decrease burden of Care through IP rehab admission: n/a  Possible need for SNF placement upon discharge: not anticipated  Patient Condition: This patient's condition remains as documented in the consult dated 04/23/24, in which the Rehabilitation Physician determined and documented that the patient's condition is appropriate for intensive rehabilitative care in an inpatient rehabilitation facility. Will admit to inpatient rehab today.  Preadmission Screen Completed By:  Alison Heron Lot, 04/24/2024 1:35 PM ______________________________________________________________________   Discussed status with Dr. Emeline on 04/24/24 at 1406 and received approval for admission today.  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 Yes Co-Morbidities requiring supervision/potential complications: Carotid artery stenosis status post angioplasty 12-15, hypertension, diabetes, coronary artery disease, pancytopenia, liver cirrhosis, urinary retention, constipation Due to bladder management, bowel management, safety, skin/wound care, disease management, medication administration, pain management, and patient education, does the patient require 24 hr/day rehab nursing? Yes Does the patient require coordinated care of a physician, rehab nurse, PT, OT, and SLP to address physical and functional deficits in the context of the above medical diagnosis(es)? Yes Addressing deficits in the following areas: balance, endurance, locomotion, strength, transferring, bowel/bladder control, bathing, dressing, feeding, grooming, toileting, cognition, speech, language, dysphagia, and psychosocial support Can the patient actively participate in an intensive therapy program of at least 3 hrs of therapy 5 days a week? Yes The potential for patient to make measurable gains while on inpatient rehab is good Anticipated functional outcomes upon discharge from inpatient rehab: supervision PT, supervision OT, supervision SLP Estimated rehab length of stay to reach the above functional goals is: 10-14 days Anticipated discharge destination: Home 10. Overall Rehab/Functional Prognosis: good   MD Signature:  Joesph  JAYSON Likes, DO 04/24/2024      [1]  Current Facility-Administered Medications:    0.9 %  sodium chloride  infusion, 500 mL, Intravenous, Once PRN, Baglia, Corrina, PA-C   acetaminophen  (TYLENOL ) tablet 650 mg, 650 mg, Oral, Q4H PRN **OR** acetaminophen  (TYLENOL ) 160  MG/5ML solution 650 mg, 650 mg, Per Tube, Q4H PRN **OR** acetaminophen  (TYLENOL ) suppository 650 mg, 650 mg, Rectal, Q4H PRN, Baglia, Corrina, PA-C   aspirin  chewable tablet 81 mg, 81 mg, Oral, Daily, Baglia, Corrina, PA-C, 81 mg at 04/24/24 9161   atorvastatin  (LIPITOR ) tablet 80 mg, 80 mg, Oral, q1800, Baglia, Corrina, PA-C, 80 mg at 04/23/24 1712   Chlorhexidine  Gluconate Cloth 2 % PADS 6 each, 6 each, Topical, Daily, Baglia, Corrina, PA-C, 6 each at 04/20/24 1842   clopidogrel  (PLAVIX ) tablet 75 mg, 75 mg, Oral, Daily, Baglia, Corrina, PA-C, 75 mg at 04/24/24 9161   docusate sodium  (COLACE) capsule 100 mg, 100 mg, Oral, Daily, Baglia, Corrina, PA-C, 100 mg at 04/24/24 9161   feeding supplement (ENSURE PLUS HIGH PROTEIN) liquid 237 mL, 237 mL, Oral, BID BM, Baglia, Corrina, PA-C, 237 mL at 04/23/24 1600   FLUoxetine  (PROZAC ) capsule 20 mg, 20 mg, Oral, Daily, Baglia, Corrina, PA-C, 20 mg at 04/24/24 9161   insulin  aspart (novoLOG ) injection 0-5 Units, 0-5 Units, Subcutaneous, QHS, Baglia, Corrina, PA-C, 3 Units at 04/21/24 2156   insulin  aspart (novoLOG ) injection 0-9 Units, 0-9 Units, Subcutaneous, TID WC, Baglia, Corrina, PA-C, 2 Units at 04/24/24 1206   midodrine  (PROAMATINE ) tablet 10 mg, 10 mg, Oral, TID WC, Baglia, Corrina, PA-C, 10 mg at 04/24/24 1206   Oral care mouth rinse, 15 mL, Mouth Rinse, PRN, Baglia, Corrina, PA-C   sodium chloride  flush (NS) 0.9 % injection 3 mL, 3 mL, Intravenous, Q12H, Baglia, Corrina, PA-C, 3 mL at 04/24/24 0841   sodium chloride  flush (NS) 0.9 % injection 3 mL, 3 mL, Intravenous, PRN, Baglia, Corrina, PA-C   tamsulosin  (FLOMAX ) capsule 0.4 mg, 0.4 mg, Oral, QPC supper, Baglia, Corrina, PA-C, 0.4 mg at 04/23/24 1712

## 2024-04-25 DIAGNOSIS — I63239 Cerebral infarction due to unspecified occlusion or stenosis of unspecified carotid arteries: Secondary | ICD-10-CM | POA: Diagnosis not present

## 2024-04-25 DIAGNOSIS — I63512 Cerebral infarction due to unspecified occlusion or stenosis of left middle cerebral artery: Principal | ICD-10-CM | POA: Diagnosis present

## 2024-04-25 LAB — CBC WITH DIFFERENTIAL/PLATELET
Abs Immature Granulocytes: 0.1 K/uL — ABNORMAL HIGH (ref 0.00–0.07)
Basophils Absolute: 0 K/uL (ref 0.0–0.1)
Basophils Relative: 1 %
Eosinophils Absolute: 0 K/uL (ref 0.0–0.5)
Eosinophils Relative: 2 %
HCT: 25.2 % — ABNORMAL LOW (ref 39.0–52.0)
Hemoglobin: 8.7 g/dL — ABNORMAL LOW (ref 13.0–17.0)
Lymphocytes Relative: 34 %
Lymphs Abs: 0.7 K/uL (ref 0.7–4.0)
MCH: 30.9 pg (ref 26.0–34.0)
MCHC: 34.5 g/dL (ref 30.0–36.0)
MCV: 89.4 fL (ref 80.0–100.0)
Monocytes Absolute: 0.1 K/uL (ref 0.1–1.0)
Monocytes Relative: 3 %
Myelocytes: 4 %
Neutro Abs: 1.1 K/uL — ABNORMAL LOW (ref 1.7–7.7)
Neutrophils Relative %: 54 %
Platelets: 59 K/uL — ABNORMAL LOW (ref 150–400)
Promyelocytes Relative: 2 %
RBC: 2.82 MIL/uL — ABNORMAL LOW (ref 4.22–5.81)
RDW: 18.5 % — ABNORMAL HIGH (ref 11.5–15.5)
WBC: 2.1 K/uL — ABNORMAL LOW (ref 4.0–10.5)
nRBC: 2.4 % — ABNORMAL HIGH (ref 0.0–0.2)

## 2024-04-25 LAB — GLUCOSE, CAPILLARY
Glucose-Capillary: 112 mg/dL — ABNORMAL HIGH (ref 70–99)
Glucose-Capillary: 144 mg/dL — ABNORMAL HIGH (ref 70–99)
Glucose-Capillary: 210 mg/dL — ABNORMAL HIGH (ref 70–99)
Glucose-Capillary: 147 mg/dL — ABNORMAL HIGH (ref 70–99)

## 2024-04-25 LAB — COMPREHENSIVE METABOLIC PANEL WITH GFR
ALT: 21 U/L (ref 0–44)
AST: 29 U/L (ref 15–41)
Albumin: 3.2 g/dL — ABNORMAL LOW (ref 3.5–5.0)
Alkaline Phosphatase: 120 U/L (ref 38–126)
Anion gap: 9 (ref 5–15)
BUN: 21 mg/dL (ref 8–23)
CO2: 23 mmol/L (ref 22–32)
Calcium: 8.7 mg/dL — ABNORMAL LOW (ref 8.9–10.3)
Chloride: 108 mmol/L (ref 98–111)
Creatinine, Ser: 1 mg/dL (ref 0.61–1.24)
GFR, Estimated: >60 mL/min
Glucose, Bld: 102 mg/dL — ABNORMAL HIGH (ref 70–99)
Potassium: 4 mmol/L (ref 3.5–5.1)
Sodium: 139 mmol/L (ref 135–145)
Total Bilirubin: 1.2 mg/dL (ref 0.0–1.2)
Total Protein: 6.3 g/dL — ABNORMAL LOW (ref 6.5–8.1)

## 2024-04-25 LAB — TYPE AND SCREEN
ABO/RH(D): A POS
Antibody Screen: NEGATIVE
Unit division: 0
Unit division: 0

## 2024-04-25 LAB — BPAM RBC
Blood Product Expiration Date: 202601092359
Blood Product Expiration Date: 202601092359
ISSUE DATE / TIME: 202512150811
ISSUE DATE / TIME: 202512150811
Unit Type and Rh: 6200
Unit Type and Rh: 6200

## 2024-04-25 LAB — HAPTOGLOBIN: Haptoglobin: 86 mg/dL (ref 34–355)

## 2024-04-25 NOTE — Progress Notes (Signed)
 Inpatient Rehabilitation Center Individual Statement of Services  Patient Name:  Christopher Roberts  Date:  04/25/2024  Welcome to the Inpatient Rehabilitation Center.  Our goal is to provide you with an individualized program based on your diagnosis and situation, designed to meet your specific needs.  With this comprehensive rehabilitation program, you will be expected to participate in at least 3 hours of rehabilitation therapies Monday-Friday, with modified therapy programming on the weekends.  Your rehabilitation program will include the following services:  Physical Therapy (PT), Occupational Therapy (OT), Speech Therapy (ST), 24 hour per day rehabilitation nursing, Therapeutic Recreaction (TR), Neuropsychology, Care Coordinator, Rehabilitation Medicine, Nutrition Services, and Pharmacy Services  Weekly team conferences will be held on Wednesday to discuss your progress.  Your Inpatient Rehabilitation Care Coordinator will talk with you frequently to get your input and to update you on team discussions.  Team conferences with you and your family in attendance may also be held.  Expected length of stay: 11-15 days  Overall anticipated outcome: supervision with cues  Depending on your progress and recovery, your program may change. Your Inpatient Rehabilitation Care Coordinator will coordinate services and will keep you informed of any changes. Your Inpatient Rehabilitation Care Coordinator's name and contact numbers are listed  below.  The following services may also be recommended but are not provided by the Inpatient Rehabilitation Center:  Driving Evaluations Home Health Rehabiltiation Services Outpatient Rehabilitation Services    Arrangements will be made to provide these services after discharge if needed.  Arrangements include referral to agencies that provide these services.  Your insurance has been verified to be:  [facility] medicare Your primary doctor is:  Quintin Sheerer [place]  Pertinent  information will be shared with your doctor and your insurance company.  Inpatient Rehabilitation Care Coordinator:  Rhoda Clement, KEN [phone] or (CARLINE BASQUES  Information discussed with and copy given to patient by: Clement Asberry MATSU, 04/25/2024, 11:02 AM

## 2024-04-25 NOTE — Progress Notes (Signed)
 Occupational Therapy Session Note  Patient Details  Name: Christopher Roberts MRN: [id number] Date of Birth: Mar 31, 1946  Today's Date: 04/25/2024 OT Individual Time: 1300-1330 OT Individual Time Calculation (min): 30 min    Short Term Goals: Week 1:  OT Short Term Goal 1 (Week 1): patient will complete toileting / toileting transfer CGA with increased attention to the R with min verbal cueing OT Short Term Goal 2 (Week 1): patient will complete grooming standing at sink with SUP to increase dynamic balance OT Short Term Goal 3 (Week 1): patient will complete LB dressing CGA  Skilled Therapeutic Interventions/Progress Updates:  Patient agreeable to participate in OT session. Reports no pain level.   Patient participated in skilled OT session focusing on toileting, functional mobility, and functional balance. Patient received in recliner. Patient able to complete functional mobility to bathroom min to mod A. Completed toileting min A for pants pull up and balance. Patient required min A and mod verbal cues for functional mobility to sink with RW due to decreased attention on R with patient running into objects on R. Able to wash hands with cues to soap on R side. Patient then returned to wc. Patient completed functional mobility 25 ft with RW with cues for staying near walker and pacing to increase safety. Patient returned to recliner with min A all needs in reach, alarm on.     Therapy Documentation Precautions:  Precautions Precautions: Fall Recall of Precautions/Restrictions: Impaired Precaution/Restrictions Comments: RLE weakness, RUE weakness Restrictions Weight Bearing Restrictions Per Provider Order: No   Therapy/Group: Individual Therapy  D'mariea L Tudor Chandley 04/25/2024, 1:28 PM

## 2024-04-25 NOTE — Progress Notes (Signed)
 Patient ID: Christopher Roberts, male   DOB: Jul 01, 1945, 78 y.o.   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 Reviewed pancytopenia and MDS work up with infection prevention tips, Discussed medication and HH/CMM diet modifications. Continue to follow along to address educational needs to facilitate preparation for discharge. Fredericka Barnie NOVAK

## 2024-04-25 NOTE — Progress Notes (Signed)
 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 s/p MI in 2005 (symptom of indigestion); b. CABG x 3 in 3/09: L-LAD, S-OM, EF unknown   Colon polyps    Diabetes mellitus    DJD (degenerative joint disease)    HLD (hyperlipidemia)    HTN (hypertension)    MI (myocardial infarction) (HCC) 2005   manifested by indigestion   Murmur    echo 6/12:  Upper septal thickening, no LVOT gradient,, EF 65%, mild LAE     Past Surgical History:  Past Surgical History:  Procedure Laterality Date   CHOLECYSTECTOMY  2002   CORONARY ARTERY BYPASS GRAFT  2009   x3   CORONARY/GRAFT ACUTE MI REVASCULARIZATION N/A 07/25/2020   Procedure: Coronary/Graft Acute MI Revascularization;  Surgeon: Jordan, Peter M, MD;  Location: [facility] INVASIVE CV LAB;  Service: Cardiovascular;  Laterality: N/A;   LEFT HEART CATH AND CORS/GRAFTS ANGIOGRAPHY N/A 07/25/2020   Procedure: LEFT HEART CATH AND CORS/GRAFTS ANGIOGRAPHY;  Surgeon: Jordan, Peter M, MD;  Location: [facility] INVASIVE CV LAB;  Service: Cardiovascular;  Laterality: N/A;   TONSILLECTOMY     TRANSCAROTID ARTERY REVASCULARIZATION  Left 04/21/2024   Procedure: LEFT TRANSCAROTID ARTERY REVASCULARIZATION (TCAR);  Surgeon: [name], MD;  Location: [facility] OR;  Service: Vascular;  Laterality: Left;   Social History:  reports that he has quit smoking. His smoking use included cigarettes. He has a 8 pack-year smoking history. He has been exposed to tobacco smoke. He has never used smokeless tobacco. He reports that he does not drink alcohol and does not use  drugs.  Family / Support Systems Marital Status: Married Patient Roles: Spouse, Parent, Engineer, Structural, Other (Comment) (retiree) Spouse/Significant Other: [place] [phone] Children: Son in [place]   Daughter who has passed away. Other Supports: Friends, great grandchildren 5, 16 6, 5 4 Anticipated Caregiver: Wife and family Ability/Limitations of Caregiver: Wife is in good health and can assist along with older great granddaughter's Caregiver Availability: 24/7 Family Dynamics: Close with family and has been raising their great granchildren, they are busy people but can rest when all in school. He hopes to do well here and recover and become mobile before going home  Social History Preferred language: English Religion: Christian Cultural Background: NA Education: HS Health Literacy - How often do you need to have someone help you when you read instructions, pamphlets, or other written material from your doctor or pharmacy?: Sometimes Writes: Yes Employment Status: Retired Marine Scientist Issues: No issues Guardian/Conservator: None according to MD pt is capable of making his own decisions while here   Abuse/Neglect Abuse/Neglect Assessment Can Be Completed: Yes Physical Abuse: Denies Verbal Abuse: Denies Sexual Abuse: Denies Exploitation of patient/patient's resources: Denies Self-Neglect: Denies  Patient response to: Social Isolation - How often do you feel lonely or isolated from those around you?: Never  Emotional Status Pt's affect, behavior and adjustment status: Pt is motivated to recover and get back to his independent level. He and wife care for thier five great grandchildren. He as independent prior to admission and his wife is in good health Recent Psychosocial Issues: other health issues-will need  follow up for MDS and liver issues Psychiatric History: History of depression takes medications for this and finds them helpful. He would benefit from seeing  neuro-psych while here Substance Abuse History: NA  Patient / Family Perceptions, Expectations & Goals Pt/Family understanding of illness & functional limitations: Pt is able to explain his stroke and L-CEA he is aware he will need a R-CEA. He talks with the MD's involved and feels understands his plan moving forward. He hopes to do well but also to not be here long Premorbid pt/family roles/activities: husband, fther, grandfather, etc Anticipated changes in roles/activities/participation: resume Pt/family expectations/goals: Pt states:  I hope to do well and get stronger my legs are weak now.  Community [employer] Agencies: None Premorbid Home Care/DME Agencies: Other (Comment) (rw, tub seat, wc, cane) Transportation available at discharge: wife Is the patient able to respond to transportation needs?: Yes In the past 12 months, has lack of transportation kept you from medical appointments or from getting medications?: No In the past 12 months, has lack of transportation kept you from meetings, work, or from getting things needed for daily living?: No Resource referrals recommended: Neuropsychology  Discharge Planning Living Arrangements: Spouse/significant other, Other relatives Support Systems: Spouse/significant other, Children, Other relatives, Friends/neighbors Type of Residence: Private residence Insurance Resources: [employer] (specify) ([employer] Medicare) Financial Resources: Social Security, Family Support Financial Screen Referred: No Living Expenses: Own Money Management: Patient, Spouse Does the patient have any problems obtaining your medications?: No Home Management: both Patient/Family Preliminary Plans: Retjurn home with wife and great grandchildren all will be assisting, if needed. His main caregiver will be his wife at times she is transporting grands to school. So maybe an hour alone. Will await team's evaluations and work on discharge needs. Care  Coordinator Barriers to Discharge: Insurance for SNF coverage Care Coordinator Anticipated Follow Up Needs: HH/OP  Clinical Impression Pleasant gentleman who is motivated to recover and regain his independence. His wife and great grandchildren are involved and will assist if can. Will work on discharge needs. Will place on neuro-psych to be seen while here due to history of depression  Elisabella Hacker, [place] MATSU 04/25/2024, 11:00 AM    "

## 2024-04-25 NOTE — IPOC Note (Signed)
 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 [facility]) Active Problems:   Left middle cerebral artery stroke [facility])     Functional Problem List: Nursing Safety, Bowel, Bladder, Endurance, Medication Management  PT    OT    SLP    TR         Basic ADLs: OT       Advanced  ADLs: OT       Transfers: PT    OT       Locomotion: PT       Additional Impairments: OT    SLP        TR      Anticipated Outcomes Item Anticipated Outcome  Self Feeding    Swallowing      Basic self-care     Toileting      Bathroom Transfers    Bowel/Bladder  manage bowel and bladder w mod I assist  Transfers     Locomotion     Communication     Cognition     Pain  n/a  Safety/Judgment  manage safety w cues   Therapy Plan:         Team Interventions: Nursing Interventions Patient/Family Education, Medication Management, Bladder Management, Bowel Management, Disease Management/Prevention, Discharge Planning, Dysphagia/Aspiration Precaution Training  PT interventions    OT Interventions    SLP Interventions    TR Interventions    SW/CM Interventions     Barriers to Discharge MD  Medical stability  Nursing Decreased caregiver support, Home environment access/layout 2 level 1/2 ba on main, B+B up flight of steps w spouse ;Pt lives with wife who is able to assist as needed. Pt has granddaughters who live there too  PT      OT      SLP      SW       Team Discharge Planning: Destination: PT-  ,OT-   , SLP-  Projected Follow-up: PT- , OT-   , SLP-  Projected Equipment Needs: PT- , OT-  , SLP-  Equipment Details: PT- , OT-  Patient/family involved in discharge  planning: PT-  ,  OT- , SLP-   MD ELOS: 10-14d Medical Rehab Prognosis:  Good Assessment: The patient has been admitted for CIR therapies with the diagnosis of L MCA stroke . The team will be addressing functional mobility, strength, stamina, balance, safety, adaptive techniques and equipment, self-care, bowel and bladder mgt, patient and caregiver education, monitoring and management of pancytopenia on anticoagulants. Goals have been set at Sup. Anticipated discharge destination is Home.        See Team Conference Notes for weekly updates to the plan of care

## 2024-04-25 NOTE — Plan of Care (Signed)
" °  Problem: RH Expression Communication Goal: LTG Patient will verbally express basic/complex needs(SLP) Description: LTG:  Patient will verbally express basic/complex needs, wants or ideas with cues  (SLP) Flowsheets (Taken 04/25/2024 1555) LTG: Patient will verbally express basic/complex needs, wants or ideas (SLP): Supervision Goal: LTG Patient will increase word finding of common (SLP) Description: LTG:  Patient will increase word finding of common objects/daily info/abstract thoughts with cues using compensatory strategies (SLP). Flowsheets (Taken 04/25/2024 1555) LTG: Patient will increase word finding of common (SLP): Supervision   Problem: RH Problem Solving Goal: LTG Patient will demonstrate problem solving for (SLP) Description: LTG:  Patient will demonstrate problem solving for basic/complex daily situations with cues  (SLP) Flowsheets (Taken 04/25/2024 1555) LTG: Patient will demonstrate problem solving for (SLP):  Basic daily situations  Complex daily situations LTG Patient will demonstrate problem solving for: Supervision Note: Mildly complex   Problem: RH Memory Goal: LTG Patient will use memory compensatory aids to (SLP) Description: LTG:  Patient will use memory compensatory aids to recall biographical/new, daily complex information with cues (SLP) Flowsheets (Taken 04/25/2024 1555) LTG: Patient will use memory compensatory aids to (SLP): Supervision Note: Recall recent/relevant info   Problem: RH Attention Goal: LTG Patient will demonstrate this level of attention during functional activites (SLP) Description: LTG:  Patient will will demonstrate this level of attention during functional activites (SLP) Flowsheets (Taken 04/25/2024 1555) Patient will demonstrate during cognitive/linguistic activities the attention type of: Selective Patient will demonstrate this level of attention during cognitive/linguistic activities in: Controlled LTG: Patient will demonstrate this  level of attention during cognitive/linguistic activities with assistance of (SLP): Supervision Number of minutes patient will demonstrate attention during cognitive/linguistic activities: 15   "

## 2024-04-25 NOTE — Discharge Summary (Signed)
 Physician Discharge Summary  Patient ID: Christopher Roberts MRN: [id number] DOB/AGE: 05-08-1946 78 y.o.  Admit date: 04/24/2024 Discharge date: 05/14/2024  Discharge Diagnoses:  Principal Problem:   Acute ischemic cerebrovascular accident (CVA) involving internal carotid artery territory [facility]) Active Problems:   Bilateral carotid artery stenosis   Left middle cerebral artery stroke (HCC)   Expressive aphasia   Orthostatic hypotension Symptomatic left carotid artery stenosis Hypotension Diabetes mellitus Hyperlipidemia CAD/PCI/CABG/SVT Pancytopenia Mild cirrhosis of liver Urinary retention/Proteus UTI/hematuria Constipation   Discharged Condition: Stable  Significant Diagnostic Studies: DG Chest 2 View Result Date: 05/03/2024 CLINICAL DATA:  Fever. EXAM: CHEST - 2 VIEW COMPARISON:  None available. FINDINGS: Prior median sternotomy.The cardiomediastinal contours are normal. Subpleural reticulation with a slight basilar predominant distribution. Calcified granulomas in the right lung. Pulmonary vasculature is normal. No consolidation, pleural effusion, or pneumothorax. Chronic right shoulder arthropathy. No acute osseous abnormalities are seen. IMPRESSION: 1. No acute findings. 2. Subpleural reticulation with a slight basilar predominant distribution, suggesting interstitial lung disease. Electronically Signed   By: Andrea Gasman M.D.   On: 05/03/2024 16:21   DG C-Arm 1-60 Min Result Date: 04/21/2024 EXAM: FLUOROSCOPIC IMAGING TECHNIQUE: Fluoroscopy was provided by the radiology department for procedure. Radiologist was not present during examination. RADIATION DOSE INDEX: Reference Air Kerma: 38.5 mGy COMPARISON: None available. CLINICAL HISTORY: LEFT TRANSCAROTID ARTERY REVASCULARIZATION (TCAR) FINDINGS: Intraoperative fluoroscopic imaging was performed. IMPRESSION: 1. Intraoperative fluoroscopic imaging as above. Please refer to the operative report for full details. Electronically  signed by: Katheleen Faes MD 04/21/2024 03:53 PM EST RP Workstation: [id number]   VAS US  CAROTID Result Date: 04/19/2024 Carotid Arterial Duplex Study Patient Name:  Christopher Roberts  Date of Exam:   04/17/2024 Medical Rec #: [id number]       Accession #:    [id number] Date of Birth: December 01, 1945       Patient Gender: M Patient Age:   78 years Exam Location:  [facility] Procedure:      VAS US  CAROTID Referring Phys: LUCIE RHYNE --------------------------------------------------------------------------------  Indications:       CVA and Carotid artery disease. Risk Factors:      Hypertension, hyperlipidemia, Diabetes, past history of                    smoking, prior MI, coronary artery disease. Comparison Study:  No previous carotid duplex exams. CTA on 04/26/2024 showed RT                    ICA >90% & LT ICA 60-70% Performing Technologist: Jody Hill RVT, RDMS  Examination Guidelines: A complete evaluation includes B-mode imaging, spectral Doppler, color Doppler, and power Doppler as needed of all accessible portions of each vessel. Bilateral testing is considered an integral part of a complete examination. Limited examinations for reoccurring indications may be performed as noted.  Right Carotid Findings: +----------+--------+--------+--------+---------------------+------------------+           PSV cm/sEDV cm/sStenosisPlaque Description   Comments           +----------+--------+--------+--------+---------------------+------------------+ CCA Prox  85      13                                                      +----------+--------+--------+--------+---------------------+------------------+ CCA Mid  heterogenous                            +----------+--------+--------+--------+---------------------+------------------+ CCA Distal98      17              heterogenous         intimal thickening  +----------+--------+--------+--------+---------------------+------------------+ ICA Prox  314     91      60-79%  calcific, diffuse and                                                     irregular                               +----------+--------+--------+--------+---------------------+------------------+ ICA Mid   252     58                                                      +----------+--------+--------+--------+---------------------+------------------+ ICA Distal83      23                                                      +----------+--------+--------+--------+---------------------+------------------+ ECA       129     0               calcific                                +----------+--------+--------+--------+---------------------+------------------+ +----------+--------+-------+----------------+-------------------+           PSV cm/sEDV cmsDescribe        Arm Pressure (mmHG) +----------+--------+-------+----------------+-------------------+ Subclavian203            Multiphasic, WNL                    +----------+--------+-------+----------------+-------------------+ +---------+--------+--+--------+--+---------+ VertebralPSV cm/s64EDV cm/s13Antegrade +---------+--------+--+--------+--+---------+  Left Carotid Findings: +----------+--------+--------+--------+-------------------------------+--------+           PSV cm/sEDV cm/sStenosisPlaque Description             Comments +----------+--------+--------+--------+-------------------------------+--------+ CCA Prox  106     20                                                      +----------+--------+--------+--------+-------------------------------+--------+ CCA Mid                           calcific and heterogenous               +----------+--------+--------+--------+-------------------------------+--------+ CCA Distal141     31                                                       +----------+--------+--------+--------+-------------------------------+--------+  ICA Prox  191     56      40-59%  calcific, irregular and diffuse         +----------+--------+--------+--------+-------------------------------+--------+ ICA Mid   124     39                                                      +----------+--------+--------+--------+-------------------------------+--------+ ICA Distal97      29                                                      +----------+--------+--------+--------+-------------------------------+--------+ ECA       80                      calcific and heterogenous               +----------+--------+--------+--------+-------------------------------+--------+ +----------+--------+--------+----------------+-------------------+           PSV cm/sEDV cm/sDescribe        Arm Pressure (mmHG) +----------+--------+--------+----------------+-------------------+ [id number]             Multiphasic, WNL                    +----------+--------+--------+----------------+-------------------+ +---------+--------+--+--------+--+---------+ VertebralPSV cm/s51EDV cm/s17Antegrade +---------+--------+--+--------+--+---------+   Summary: Right Carotid: Velocities in the right ICA are consistent with a 60-79% stenosis                (upper end of range). Left Carotid: Velocities in the left ICA are consistent with a 40-59% stenosis               (upper end of range). Vertebrals:  Bilateral vertebral arteries demonstrate antegrade flow. Subclavians: Normal flow hemodynamics were seen in bilateral subclavian              arteries. *See table(s) above for measurements and observations.  Electronically signed by Eather Popp MD on 04/19/2024 at 11:29:45 AM.    Final    ECHOCARDIOGRAM COMPLETE Result Date: 04/16/2024    ECHOCARDIOGRAM REPORT   Patient Name:   JUSTN QUALE Date of Exam: 04/16/2024 Medical Rec #:  [id number]      Height:       69.0 in  Accession #:    [id number]     Weight:       172.4 lb Date of Birth:  25-Nov-1945      BSA:          1.940 m Patient Age:    78 years       BP:           138/67 mmHg Patient Gender: M              HR:           74 bpm. Exam Location:  Inpatient Procedure: 2D Echo, Cardiac Doppler and Color Doppler (Both Spectral and Color            Flow Doppler were utilized during procedure). Indications:    Stroke 163.9  History:        Patient has prior history of Echocardiogram examinations, most  recent 07/26/2020. CAD and Previous Myocardial Infarction,                 Stroke, Signs/Symptoms:Chest Pain and Shortness of Breath; Risk                 Factors:Dyslipidemia, Hypertension, Diabetes and Former Smoker.  Sonographer:    Merlynn Argyle Referring Phys: [phone] REDIA LOISE CLEAVER  Sonographer Comments: Image acquisition challenging due to respiratory motion. IMPRESSIONS  1. Left ventricular ejection fraction, by estimation, is 60 to 65%. The left ventricle has normal function. The left ventricle has no regional wall motion abnormalities. There is mild asymmetric left ventricular hypertrophy of the basal-septal segment. Left ventricular diastolic parameters are consistent with Grade I diastolic dysfunction (impaired relaxation).  2. Right ventricular systolic function is normal. The right ventricular size is mildly enlarged. There is normal pulmonary artery systolic pressure. The estimated right ventricular systolic pressure is 16.1 mmHg.  3. Left atrial size was moderately dilated.  4. The mitral valve is degenerative. Mild mitral valve regurgitation. No evidence of mitral stenosis. Moderate mitral annular calcification.  5. The aortic valve is tricuspid. There is mild calcification of the aortic valve. There is mild thickening of the aortic valve. Aortic valve regurgitation is trivial. Aortic valve sclerosis is present, with no evidence of aortic valve stenosis.  6. The inferior vena cava is normal in size with  greater than 50% respiratory variability, suggesting right atrial pressure of 3 mmHg. Conclusion(s)/Recommendation(s): No intracardiac source of embolism detected on this transthoracic study. Consider a transesophageal echocardiogram to exclude cardiac source of embolism if clinically indicated. FINDINGS  Left Ventricle: Left ventricular ejection fraction, by estimation, is 60 to 65%. The left ventricle has normal function. The left ventricle has no regional wall motion abnormalities. The left ventricular internal cavity size was normal in size. There is  mild asymmetric left ventricular hypertrophy of the basal-septal segment. Left ventricular diastolic parameters are consistent with Grade I diastolic dysfunction (impaired relaxation). Right Ventricle: The right ventricular size is mildly enlarged. No increase in right ventricular wall thickness. Right ventricular systolic function is normal. There is normal pulmonary artery systolic pressure. The tricuspid regurgitant velocity is 1.81  m/s, and with an assumed right atrial pressure of 3 mmHg, the estimated right ventricular systolic pressure is 16.1 mmHg. Left Atrium: Left atrial size was moderately dilated. Right Atrium: Right atrial size was normal in size. Pericardium: There is no evidence of pericardial effusion. Mitral Valve: The mitral valve is degenerative in appearance. There is mild thickening of the mitral valve leaflet(s). There is mild calcification of the mitral valve leaflet(s). Moderate mitral annular calcification. Mild mitral valve regurgitation. No evidence of mitral valve stenosis. Tricuspid Valve: The tricuspid valve is normal in structure. Tricuspid valve regurgitation is trivial. No evidence of tricuspid stenosis. Aortic Valve: The aortic valve is tricuspid. There is mild calcification of the aortic valve. There is mild thickening of the aortic valve. Aortic valve regurgitation is trivial. Aortic valve sclerosis is present, with no evidence  of aortic valve stenosis. Pulmonic Valve: The pulmonic valve was normal in structure. Pulmonic valve regurgitation is trivial. No evidence of pulmonic stenosis. Aorta: The aortic root is normal in size and structure. Venous: The inferior vena cava is normal in size with greater than 50% respiratory variability, suggesting right atrial pressure of 3 mmHg. IAS/Shunts: No atrial level shunt detected by color flow Doppler.  LEFT VENTRICLE PLAX 2D LVIDd:         4.60 cm  Diastology LVIDs:         3.40 cm   LV e' medial:    4.66 cm/s LV PW:         1.00 cm   LV E/e' medial:  19.7 LV IVS:        1.30 cm   LV e' lateral:   15.50 cm/s LVOT diam:     2.20 cm   LV E/e' lateral: 5.9 LV SV:         108 LV SV Index:   56 LVOT Area:     3.80 cm  RIGHT VENTRICLE RV Basal diam:  4.30 cm RV Mid diam:    3.50 cm RV S prime:     21.80 cm/s TAPSE (M-mode): 1.5 cm LEFT ATRIUM             Index        RIGHT ATRIUM           Index LA diam:        4.00 cm 2.06 cm/m   RA Area:     19.00 cm LA Vol (A2C):   71.7 ml 36.97 ml/m  RA Volume:   51.60 ml  26.60 ml/m LA Vol (A4C):   96.7 ml 49.85 ml/m LA Biplane Vol: 87.0 ml 44.85 ml/m  AORTIC VALVE LVOT Vmax:   127.00 cm/s LVOT Vmean:  94.800 cm/s LVOT VTI:    0.285 m  AORTA Ao Root diam: 3.70 cm Ao Asc diam:  3.70 cm MITRAL VALVE               TRICUSPID VALVE MV Area (PHT): 2.55 cm    TR Peak grad:   13.1 mmHg MV Decel Time: 297 msec    TR Vmax:        181.00 cm/s MV E velocity: 92.00 cm/s MV A velocity: 90.30 cm/s  SHUNTS MV E/A ratio:  1.02        Systemic VTI:  0.29 m                            Systemic Diam: 2.20 cm Oneil Parchment MD Electronically signed by Oneil Parchment MD Signature Date/Time: 04/16/2024/11:50:06 AM    Final    CT ANGIO HEAD NECK W WO CM Result Date: 04/16/2024 EXAM: CTA HEAD AND NECK WITHOUT AND WITH 04/16/2024 05:41:14 AM TECHNIQUE: CTA of the head and neck was performed without and with the administration of 75 mL of iohexol  (OMNIPAQUE ) 350 MG/ML injection.  Multiplanar 2D and/or 3D reformatted images are provided for review. Automated exposure control, iterative reconstruction, and/or weight based adjustment of the mA/kV was utilized to reduce the radiation dose to as low as reasonably achievable. Stenosis of the internal carotid arteries measured using NASCET criteria. COMPARISON: CT of the head dated 04/15/2024 CLINICAL HISTORY: Stroke/TIA, determine embolic source. FINDINGS: CTA NECK: AORTIC ARCH AND ARCH VESSELS: No dissection or arterial injury. No significant stenosis of the brachiocephalic or subclavian arteries. There is calcific atheromatous disease within the aortic arch. CERVICAL CAROTID ARTERIES: No dissection or arterial injury. There is moderate calcific plaque within the carotid bulbs and origins of the internal carotid arteries bilaterally. There is near occlusive stenosis of the proximal right internal carotid artery with estimated stenosis 90% or greater. There is extensive calcific plaque within the proximal left internal carotid artery with estimated stenosis approximately 60 to 70%. CERVICAL VERTEBRAL ARTERIES: No dissection, arterial injury, or significant stenosis. LUNGS AND MEDIASTINUM: Unremarkable. SOFT  TISSUES: No acute abnormality. BONES: No acute abnormality. CTA HEAD: ANTERIOR CIRCULATION: There is moderately advanced calcific atheromatous disease within the carotid siphons. There is moderate stenosis of the cavernous and supraclinoid segments, approximately 40 to 50% bilaterally. No significant stenosis of the anterior cerebral arteries. No significant stenosis of the middle cerebral arteries. No aneurysm. POSTERIOR CIRCULATION: There is moderate-to-severe stenosis of the P2 segment of the left posterior cerebral artery and there is moderate stenosis of the P2 segment of the right posterior cerebral artery. The focal stenoses are demonstrated on images 104 and 106 of series 9, respectively. No significant stenosis of the basilar artery.  There is moderately advanced calcific atheromatous disease within the vertebral arteries. No aneurysm. OTHER: There is age-related atrophy and moderately advanced cerebral white matter disease. Chronic encephalomalacia changes are noted within the frontal and parietal lobes bilaterally from remote cortical infarcts. Chronic infarcts are also noted within the cerebellar hemispheres bilaterally. No dural venous sinus thrombosis on this non-dedicated study. IMPRESSION: 1. 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 2. Extensive calcific plaque within the proximal left internal carotid artery with estimated stenosis approximately 60 to 70%. 3. Moderate-to-severe stenosis of the P2 segment of the left posterior cerebral artery and moderate stenosis of the P2 segment of the right posterior cerebral artery. 4. Moderate stenosis of the cavernous and supraclinoid segments, approximately 40 to 50% bilaterally. Electronically signed by: Evalene Coho MD 04/16/2024 06:14 AM EST RP Workstation: [id number]   MR BRAIN WO CONTRAST Result Date: 04/15/2024 EXAM: MRI BRAIN WITHOUT CONTRAST 04/15/2024 07:46:35 PM TECHNIQUE: Multiplanar multisequence MRI of the head/brain was performed without the administration of intravenous contrast. COMPARISON: CT head from earlier today. MRI head 08/30/2016. CLINICAL HISTORY: Neuro deficit, acute, stroke suspected FINDINGS: BRAIN AND VENTRICLES: Punctate acute infarcts in the high right frontal and left parasagittal frontal lobes (series 2, images 39 and 37). No intracranial hemorrhage. No mass. No midline shift. No hydrocephalus. Advanced T2 hyperintensity in the white matter, compatible with chronic avascular ischemic disease. Remote lacunar infarcts in the corona radiata and bilateral parietal cortex. Multiple remote cerebellar infarcts bilaterally. Normal flow voids. ORBITS: No acute abnormality. SINUSES AND MASTOIDS: No  acute abnormality. BONES AND SOFT TISSUES: Normal marrow signal. No acute soft tissue abnormality. IMPRESSION: 1. Punctate acute infarcts in the high right frontal and left parasagittal frontal lobes. 2. 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 Electronically signed by: Gilmore Molt MD 04/15/2024 09:43 PM EST RP Workstation: [id number]   CT HEAD WO CONTRAST Result Date: 04/15/2024 CLINICAL DATA:  Clemens out of bed, neurologic deficit EXAM: CT HEAD WITHOUT CONTRAST TECHNIQUE: Contiguous axial images were obtained from the base of the skull through the vertex without intravenous contrast. RADIATION DOSE REDUCTION: This exam was performed according to the departmental dose-optimization program which includes automated exposure control, adjustment of the mA and/or kV according to patient size and/or use of iterative reconstruction technique. COMPARISON:  08/31/2016 FINDINGS: Brain: There are chronic cortical infarcts along the bilateral frontal and parietal convexities, as well as within the bilateral cerebellar hemispheres. Chronic small vessel ischemic changes are seen throughout the periventricular and subcortical white matter. No evidence of acute infarct or hemorrhage. Dense bilateral basal ganglia calcifications are again noted. Lateral ventricles and midline structures are otherwise unremarkable. No acute extra-axial fluid collections. No mass effect. Vascular: No hyperdense vessel or unexpected calcification. Skull: Normal. Negative for fracture or focal lesion. Sinuses/Orbits: No acute finding. Other: None.  IMPRESSION: 1. No acute intracranial process. 2. Chronic ischemic changes as above. Electronically Signed   By: Ozell Daring M.D.   On: 04/15/2024 17:50    Labs:  Basic Metabolic Panel: Recent Labs  Lab 05/08/24 0608 05/09/24 0523 05/12/24 0550  NA 137 138 136  K 4.0 4.0 4.1  CL 108 108 107  CO2 20* 21* 20*  GLUCOSE 121* 136* 139*  BUN 30*  28* 29*  CREATININE 1.06 1.13 1.19  CALCIUM  8.1* 7.8* 7.8*    CBC: Recent Labs  Lab 05/08/24 0608 05/09/24 0523 05/12/24 0550  WBC 2.4*  --  3.7*  HGB 7.6* 7.5* 7.6*  HCT 22.1* 21.9* 22.5*  MCV 89.5  --  89.3  PLT 43*  --  56*    CBG: Recent Labs  Lab 05/12/24 2144 05/13/24 0635 05/13/24 1139 05/13/24 1644 05/13/24 2121  GLUCAP 118* 137* 156* 162* 103*    Brief HPI:   TALMADGE GANAS is a 78 y.o. right-handed male with history of diabetes mellitus hypertension hyperlipidemia, 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, tobacco use, pancytopenia followed by oncology services Dr.Pasam and being worked up for possible MDS.  Per chart review patient lives with wife.  Independent with a cane/rolling Roberts.  Wife and granddaughter provide assistance.  Presented 04/15/2024 after a fall when he tried to get up from bed around 10 AM.  He lost his balance fell onto the floor and laid there for approximately 1 hour before being found by family member.  CT/MRI punctate acute infarct in the high right frontal and left parasagittal frontal lobes.  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  CTA showed near occlusive stenosis of the proximal right ICA with estimated stenosis 90% or greater.  Admission chemistries unremarkable except hemoglobin 10.3 RBCs 3.38 WBC 2.1 platelets 52,000 chloride 112 urine drug screen negative hemoglobin A1c 5.6 total bilirubin 1.4-1.9, INR 1.3.  Echocardiogram with ejection fraction of 60 to 65% no wall motion abnormalities.  Follow-up vascular surgery Dr. Lonni Gaskins for symptomatic left ICA stenosis underwent transcatheter placement of left cervical carotid artery stenting angioplasty 04/21/2024.  Patient currently maintained on aspirin  81 mg daily and Plavix  75 mg daily for CVA prophylaxis.  Hospital course bouts of hypotension currently on ProAmatine  beta-blocker was held.  He did have 1 episode of urinary retention requiring  In-N-Out catheterization with 1 L retained 12/13 without recurrence and placed on Flomax .  Findings of mildly elevated total bilirubin imaging suggestive of mild cirrhosis plan outpatient workup.  Therapy evaluations completed due to patient's decreased functional mobility was admitted for a comprehensive rehab program.   Hospital Course: Christopher Roberts was admitted to rehab 04/24/2024 for inpatient therapies to consist of PT, ST and OT at least three hours five days a week. Past admission physiatrist, therapy team and rehab RN have worked together to provide customized collaborative inpatient rehab.  Pertaining to patient's left small ACA right punctate MCA/ACA infarct likely due to large vessel disease from bilateral ICA stenosis remains stable.  Patient would continue aspirin  and Plavix  for CVA prophylaxis follow-up neurology services.  Hospital course symptomatic left carotid artery stenosis status post stenting angioplasty 04/21/2024 per Dr. Gaskins.  Bouts of hypotension continued on ProAmatine  monitored with increased mobility.  History of CAD PCI CABG no chest pain or shortness of breath follow-up outpatient [name].  History of pancytopenia hematology workup Dr. Autumn as outpatient and monitoring of chemistries.  Mood stabilization with Prozac  as indicated providing emotional  support and patient attending full therapies.  Findings of mild cirrhosis of the liver on imaging suggestive of cirrhosis bilirubin mildly elevated as well as INR 1.3 follow-up outpatient.  Patient did have initial bouts of urinary retention/hematuria and placed on Flomax  however discontinued due to some orthostatic changes.  Due to his hematuria Foley tube was placed ambulatory referral obtained with urology services for 05/21/2024.  Findings of grade 100,000 Proteus UTI sensitive to Keflex  completing 7-day course.  An ambulatory referral was obtained with urology services.  Blood sugars controlled currently on SSI hemoglobin  A1c 5.6 patient on Glucophage  prior to admission resumed as needed.  Bouts of constipation resolved with laxative assistance.   Blood pressures were monitored on TID basis and remained soft and monitored  Diabetes has been monitored with ac/hs CBG checks and SSI was use prn for tighter BS control.    Rehab course: During patient's stay in rehab weekly team conferences were held to monitor patient's progress, set goals and discuss barriers to discharge. At admission, patient required moderate assist 40 feet rolling Roberts moderate assist step pivot transfers  He/She  has had improvement in activity tolerance, balance, postural control as well as ability to compensate for deficits. He/She has had improvement in functional use RUE/LUE  and RLE/LLE as well as improvement in awareness.  Working with energy conservation techniques.  Focus session is on endurance training and dynamic balance as overall safety.  Engage patient in completing dynamic standing balance beanbag toss activity to work on dynamic standing balance and reactor he balance with blocked practice of sit to stand posterior reaching incorporated into task to simulate skills required for basic ADLs.  Patient instructed to complete sit to stand between each throw and maintain standing balance.  Ambulatory transfers completed using rolling Roberts contact-guard.  He completed ambulatory transfers to the bathroom using rolling Roberts and contact-guard and completed 3/3 toileting task seated on elevated toilet seat with supervision.  Ambulatory transfers to walk-in shower completed using rolling Roberts and grab bar contact-guard.  Completed functional mobility back to wheelchair using rolling Roberts and contact-guard.  Donned overhead shirt with supervision and pants contact-guard when standing to bring pants to waist.  Donned bilateral socks with set up.  Patient chose to sit at sink to brush teeth for energy conservation completing ADLs with  supervision.  SLP follow-up by educating patient on writing repeat associate pictures.  Patient provided examples of each strategy with supervision assist.  SLP then targeted short-term memory through paragraph retention task.  Patient utilize strategies to recall important information.  Full family teaching completed plan discharge to home       Disposition:  Discharge disposition: 06-Home-Health Care Svc        Diet: Diabetic diet  Special Instructions: No driving smoking or alcohol  Follow-up outpatient for findings of mild cirrhosis of the liver identified on imaging with mildly elevated bilirubin/INR  Ambulatory referral obtained with urology services 05/21/2024  Medications at discharge. 1.  Tylenol  as needed 2.  Aspirin  81 mg p.o. daily 3.  Lipitor  80 mg p.o. daily 4.  Plavix  75 mg p.o. daily 5.  Colace 100 mg p.o. daily 6.  Prozac  20 mg p.o. daily 7.  ProAmatine  5 mg 3 times daily 8.  Trazodone  50 mg nightly as needed 9.  Glucophage  500 mg p.o. daily 10.  MiraLAX  daily as needed 11.  Antivert  25 mg 3 times daily as needed 12.  Nitroglycerin  as needed chest pain 13.  Melatonin 5 mg  p.o. nightly as needed sleep   30-35 minutes were spent completing discharge summary and discharge planning Discharge Instructions     Ambulatory referral to Neurology   Complete by: As directed    An appointment is requested in approximately: 4 weeks left MCA/ACA infarction   Ambulatory referral to Physical Medicine Rehab   Complete by: As directed    Moderate complexity follow-up 1 to 2 weeks left ACA/MCA infarction        Follow-up Information     Kirsteins, Prentice BRAVO, MD Follow up.   Specialty: Physical Medicine and Rehabilitation Why: Office to call for appointment Contact information: [address] [place] [address] [place] [address] [phone]         [name], MD Follow up.   Specialty: Vascular Surgery Why: Call for appointment Contact  information: [address] [place] [address] [phone]         [name], Quintin PARAS, MD Follow up.   Specialty: Family Medicine Why: Call for appointment Contact information: [address] [place] [address] [phone]         Jordan, Peter M, MD Follow up.   Specialty: Cardiology Why: Call for appointment Contact information: [address] [place] [address] [phone]         Autumn Millman, MD Follow up.   Specialty: Oncology Why: Call for appointment Contact information: [address] [place] [address] [phone]         Elisabeth Valli BIRCH, MD Follow up.   Specialty: Urology Why: Follow-up appointment 05/21/2024 at 3:15 PM Contact information: [address] [place] [address] [phone]                 Signed: Toribio PARAS Maureen Duesing 05/14/2024, 4:32 AM

## 2024-04-25 NOTE — Progress Notes (Addendum)
 "                                                        PROGRESS NOTE   Subjective/Complaints:  No issues overnite  ROS- neg CP, SOB, N/V/D  Objective:   No results found. Recent Labs    04/24/24 0403 04/25/24 0600  WBC 2.1* 2.1*  HGB 8.5* 8.7*  HCT 24.9* 25.2*  PLT 57* 59*   Recent Labs    04/23/24 0400 04/24/24 0403  NA 139 138  K 4.4 4.0  CL 108 107  CO2 23 24  GLUCOSE 124* 115*  BUN 22 20  CREATININE 1.12 0.97  CALCIUM  8.5* 8.4*    Intake/Output Summary (Last 24 hours) at 04/25/2024 0729 Last data filed at 04/25/2024 0551 Gross per 24 hour  Intake --  Output 600 ml  Net -600 ml        Physical Exam: Vital Signs Blood pressure (!) 106/47, pulse 73, temperature 98.7 F (37.1 C), temperature source Oral, resp. rate 16, height 5' 10 (1.778 m), weight 72.5 kg, SpO2 99%.  General: No acute distress Mood and affect are appropriate Heart: Regular rate and rhythm no rubs murmurs or extra sounds Lungs: Clear to auscultation, breathing unlabored, no rales or wheezes Abdomen: Positive bowel sounds, soft nontender to palpation, nondistended Extremities: No clubbing, cyanosis, or edema Skin: No evidence of breakdown, no evidence of rash Neurologic: Cranial nerves II through XII intact, motor strength is 5/5 in left and 4/5 right deltoid, bicep, tricep, grip, hip flexor, knee extensors, ankle dorsiflexor and plantar flexor Sensory exam normal sensation to light touch  in bilateral upper and left lower lower extremities Reduced LT RLE Oriented to person and place not time Cerebellar exam mild dysmetria Right finger nose finger Musculoskeletal: Full range of motion in all 4 extremities. No joint swelling    Assessment/Plan: 1. Functional deficits which require 3+ hours per day of interdisciplinary therapy in a comprehensive inpatient rehab setting. Physiatrist is providing close team supervision and 24 hour management of active medical problems listed  below. Physiatrist and rehab team continue to assess barriers to discharge/monitor patient progress toward functional and medical goals  Care Tool:  Bathing              Bathing assist       Upper Body Dressing/Undressing Upper body dressing        Upper body assist      Lower Body Dressing/Undressing Lower body dressing            Lower body assist       Toileting Toileting    Toileting assist       Transfers Chair/bed transfer  Transfers assist           Locomotion Ambulation   Ambulation assist              Walk 10 feet activity   Assist           Walk 50 feet activity   Assist           Walk 150 feet activity   Assist           Walk 10 feet on uneven surface  activity   Assist           Wheelchair  Assist               Wheelchair 50 feet with 2 turns activity    Assist            Wheelchair 150 feet activity     Assist          Blood pressure (!) 106/47, pulse 73, temperature 98.7 F (37.1 C), temperature source Oral, resp. rate 16, height 5' 10 (1.778 m), weight 72.5 kg, SpO2 99%.  Medical Problem List and Plan: 1. 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  Antithrombotics: -DVT/anticoagulation:  Mechanical: Antiembolism stockings, thigh (TED hose) Bilateral lower extremities             -antiplatelet therapy: Aspirin  81 mg daily and Plavix  75 mg daily 3. Pain Management: Tylenol  as needed 4. Mood/Behavior/Sleep: Prozac  20 mg daily.             -antipsychotic agents: N/A 5. Neuropsych/cognition: This patient is capable of making decisions on his own behalf. 6. Skin/Wound Care: Routine skin checks 7. Fluids/Electrolytes/Nutrition:  Routine and analysis with follow-up chemistries 8.  Symptomatic left carotid artery stenosis.  Status post stenting/angioplasty 04/21/2024 per Dr. Gretta 9.  Hypotension.  ProAmatine  10 mg 3 times daily.  Monitor with increased mobility 10.  Diabetes mellitus.  Hemoglobin A1c 5.6.  Currently on SSI.  Prior to admission patient on Glucophage  500 mg daily.  Resume as needed CBG (last 3)  Recent Labs    04/24/24 1714 04/24/24 2009 04/25/24 0549  GLUCAP 175* 119* 112*  CBGs ok will cont to monitor   11.  Hyperlipidemia.  Lipitor  12.  History of CAD/PCI/CABG/SVT.  Follow-up [name] outpatient 13.  Pancytopenia.  Heme oncology Dr.Pasam outpatient.  Follow-up CBC    Latest Ref Rng & Units 04/25/2024    6:00 AM 04/24/2024    4:03 AM 04/23/2024    9:24 AM  CBC  WBC 4.0 - 10.5 K/uL 2.1  2.1  2.5   Hemoglobin 13.0 - 17.0 g/dL 8.7  8.5  9.0   Hematocrit 39.0 - 52.0 % 25.2  24.9  26.4   Platelets 150 - 400 K/uL 59  57  53    No heparin  for DVT prophyllaxis, monitor for bleeding on DAPT transfuse if PLT<20K or Hgb <7.0 14.  Cirrhosis of liver.  Imaging suggestive of cirrhosis.  Bili mildly elevated as well as INR 1.3.  Follow-up outpatient 15.  Urinary retention.  In-N-Out catheterization 12/13 x 1.  Placed on Flomax .  Check PVR 16.  Constipation.  Colace 100 mg daily    LOS: 1 days A FACE TO FACE EVALUATION WAS PERFORMED  Prentice FORBES Compton 04/25/2024, 7:29 AM     "

## 2024-04-25 NOTE — Progress Notes (Signed)
 Inpatient Rehabilitation  Patient information reviewed and entered into eRehab system by [facility]. Karen Kays., CCC/SLP, PPS Coordinator.  Information including medical coding, functional ability and quality indicators will be reviewed and updated through discharge.

## 2024-04-25 NOTE — Evaluation (Addendum)
 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 s/p MI in 2005 (symptom of indigestion); b. CABG x 3 in 3/09: L-LAD, S-OM, EF unknown   Colon polyps    Diabetes mellitus    DJD (degenerative joint disease)    HLD (hyperlipidemia)    HTN (hypertension)    MI (myocardial infarction) (HCC) 2005   manifested by indigestion   Murmur    echo 6/12:  Upper septal thickening, no LVOT gradient,, EF 65%, mild LAE     Past Surgical History:  Past Surgical History:  Procedure Laterality Date   CHOLECYSTECTOMY  2002   CORONARY ARTERY BYPASS GRAFT  2009   x3   CORONARY/GRAFT ACUTE MI REVASCULARIZATION N/A 07/25/2020   Procedure: Coronary/Graft Acute MI Revascularization;  Surgeon: Roberts, Christopher M, MD;  Location: [facility] INVASIVE CV LAB;  Service: Cardiovascular;  Laterality: N/A;   LEFT HEART CATH AND CORS/GRAFTS ANGIOGRAPHY N/A 07/25/2020   Procedure: LEFT HEART CATH AND CORS/GRAFTS ANGIOGRAPHY;  Surgeon: Roberts, Christopher M, MD;  Location: [facility] INVASIVE CV LAB;  Service: Cardiovascular;  Laterality: N/A;   TONSILLECTOMY     TRANSCAROTID ARTERY REVASCULARIZATION  Left 04/21/2024   Procedure: LEFT TRANSCAROTID ARTERY REVASCULARIZATION (TCAR);  Surgeon: Christopher Lonni PARAS, MD;  Location: [facility] OR;  Service: Vascular;  Laterality: Left;    Assessment &  Plan Clinical Impression: Patient is a 78 y.o.  right-handed male with history significant for diabetes mellitus, hypertension, hyperlipidemia, 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 Peter Roberts, tobacco use, pancytopenia followed by oncology [name] and being worked up for possible MDS.  Per chart review patient lives with wife.  Independent with cane/rolling walker.  Wife and granddaughter can assist as needed.  Presented 04/15/2024 after a fall when he tried to get up from bed around 10 AM.  He lost his balance and fell onto the floor and laid there for approxi-1 hour before being found by family member.  Family reported some right lower extremity weakness.  CT/MRI showed punctate acute infarct in the high right frontal and left parasagittal frontal lobes.  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  CTA showed near occlusive stenosis of the proximal right internal carotid artery with estimated stenosis 90% or greater.  Extensive calcified plaque within the proximal left internal carotid artery with estimated stenosis approximately 60 to 70%.  Moderate to severe stenosis of the P2 segment of the left posterior cerebral artery and moderate stenosis of the P2 segment of the right posterior cerebral artery.  Admission chemistries unremarkable except hemoglobin 10.3 RBCs 3.38, WBC 2.1, platelets 52,000, chloride 112, CO2 21, glucose 126, urine drug screen negative, hemoglobin A1c 5.6, total bilirubin 1.4-1.9, INR 1.3.  Echocardiogram with ejection fraction of 60 to 65% no wall motion abnormalities.  Follow-up vascular surgery Dr. Lonni Christopher for symptomatic left ICA stenosis 60 to 70% underwent trans catheter placement of left cervical carotid artery stenting angioplasty  04/21/2024.  Patient currently maintained on aspirin  81 mg daily and Plavix  75 mg daily for CVA prophylaxis.  Hospital course with bouts of hypotension currently on ProAmatine  and beta-blocker held.  Patient did  have 1 episode of urinary retention requiring an In-N-Out catheterization with 1 L retained 12/13 without recurrence and placed on Flomax .  Findings of mildly elevated bilirubin imaging suggestive of mild cirrhosis plan outpatient workup.  Therapy evaluations completed due to patient's decreased functional mobility was admitted for a comprehensive rehab program.    Patient currently requires mod with basic self-care skills secondary to muscle weakness, decreased cardiorespiratoy endurance, ataxia and decreased coordination, decreased attention to right, decreased problem solving and decreased safety awareness, and decreased standing balance, decreased postural control, and decreased balance strategies.  Prior to hospitalization, patient could complete ADLS  with independent .  Patient will benefit from skilled intervention to decrease level of assist with basic self-care skills and increase independence with basic self-care skills prior to discharge home with care partner.  Anticipate patient will require 24 hour supervision and follow up home health.  OT - End of Session Activity Tolerance: Tolerates 30+ min activity with multiple rests OT Assessment Rehab Potential (ACUTE ONLY): Good OT Barriers to Discharge: Decreased caregiver support OT Barriers to Discharge Comments: patient current functional status OT Patient demonstrates impairments in the following area(s): Balance;Safety;Motor;Endurance;Sensory;Cognition OT Basic ADL's Functional Problem(s): Bathing;Dressing;Toileting OT Transfers Functional Problem(s): Toilet;Tub/Shower OT Additional Impairment(s): Fuctional Use of Upper Extremity OT Plan OT Intensity: Minimum of 1-2 x/day, 45 to 90 minutes OT Frequency: 5 out of 7 days OT Treatment/Interventions: Balance/vestibular training;Discharge planning;Pain management;Self Care/advanced ADL retraining;Therapeutic Activities;UE/LE Coordination activities;Visual/perceptual  remediation/compensation;Therapeutic Exercise;Skin care/wound managment;Patient/family education;Functional mobility training;Disease mangement/prevention;Cognitive remediation/compensation;Wheelchair propulsion/positioning;UE/LE Strength taining/ROM;Psychosocial support;DME/adaptive equipment instruction;Neuromuscular re-education;Community reintegration OT Self Feeding Anticipated Outcome(s): mod I OT Basic Self-Care Anticipated Outcome(s): SUP OT Toileting Anticipated Outcome(s): SUP OT Bathroom Transfers Anticipated Outcome(s): SUP OT Recommendation Recommendations for Other Services: None Patient destination: Home Follow Up Recommendations: 24 hour supervision/assistance;Home health OT Equipment Recommended: Rolling walker with 5 wheels;3 in 1 bedside comode   OT Evaluation Precautions/Restrictions  Precautions Precautions: Fall Recall of Precautions/Restrictions: Impaired Precaution/Restrictions Comments: RLE weakness, RUE weakness Restrictions Weight Bearing Restrictions Per Provider Order: No General Chart Reviewed: Yes Response to Previous Treatment: Not applicable Family/Caregiver Present: No Vital Signs   Pain Pain Assessment Pain Scale: 0-10 Pain Score: 0-No pain Home Living/Prior Functioning Home Living Family/patient expects to be discharged to:: Private residence Living Arrangements: Spouse/significant other, Other relatives Available Help at Discharge: Family, Available 24 hours/day Type of Home: House Home Access: Level entry Home Layout: Two level, Bed/bath upstairs, 1/2 bath on main level Alternate Level Stairs-Number of Steps: 12 Alternate Level Stairs-Rails: Can reach both, Left, Right Bathroom Shower/Tub: Walk-in shower, Other (comment) Bathroom Toilet: Standard Bathroom Accessibility: Yes Additional Comments: Pt lives with wife who is able to assist as needed. Pt has teen granddaughters who live there too who can help  Lives With: Spouse,  Family IADL History Homemaking Responsibilities: Yes Meal Prep Responsibility: No Laundry Responsibility: No Cleaning Responsibility: Secondary Bill Paying/Finance Responsibility: Secondary Shopping Responsibility: No Child Care Responsibility: No Current License: Yes Mode of Transportation: Car Education: 12th grade Prior Function Level of Independence: Independent with basic ADLs, Independent with homemaking with ambulation, Independent with gait, Independent with transfers, Requires assistive device for independence  Able to Take Stairs?: Yes Driving: Yes Vocation: Retired Administrator, Sports Baseline Vision/History: 1 Wears glasses Ability to See in Adequate Light: 0 Adequate Patient Visual Report: No change  from baseline Vision Assessment?: No apparent visual deficits;Wears glasses for driving;Wears glasses for reading Perception  Perception: Impaired Perception-Other Comments: R side neglect/decreased R attention Praxis Praxis: WFL Cognition Cognition Overall Cognitive Status: Within Functional Limits for tasks assessed Arousal/Alertness: Awake/alert Memory: Appears intact Sustained Attention: Appears intact Problem Solving: Impaired Safety/Judgment: Appears intact Brief Interview for Mental Status (BIMS) Repetition of Three Words (First Attempt): 3 Temporal Orientation: Year: Missed by more than 5 years Temporal Orientation: Month: Accurate within 5 days Temporal Orientation: Day: Correct Recall: Sock: Yes, no cue required Recall: Blue: Yes, no cue required Recall: Bed: Yes, no cue required BIMS Summary Score: 12 Sensation Sensation Light Touch: Impaired by gross assessment Coordination Gross Motor Movements are Fluid and Coordinated: No Fine Motor Movements are Fluid and Coordinated: Yes Finger Nose Finger Test: [facility] Motor  Motor Motor: Ataxia  Trunk/Postural Assessment  Cervical Assessment Cervical Assessment: Exceptions to [facility] (forward head) Thoracic  Assessment Thoracic Assessment: Within Functional Limits Lumbar Assessment Lumbar Assessment: Exceptions to [facility] (posterior pelvic tilt) Postural Control Postural Control: Within Functional Limits  Balance Balance Balance Assessed: Yes Dynamic Sitting Balance Dynamic Sitting - Level of Assistance: 5: Stand by assistance Dynamic Sitting - Balance Activities: Forward lean/weight shifting;Reaching for objects Static Standing Balance Static Standing - Level of Assistance: 5: Stand by assistance Dynamic Standing Balance Dynamic Standing - Level of Assistance: 4: Min assist Dynamic Standing - Balance Activities: Lateral lean/weight shifting;Forward lean/weight shifting Extremity/Trunk Assessment RUE Assessment RUE Assessment: Exceptions to [facility] Active Range of Motion (AROM) Comments: AROM WFL General Strength Comments: 4-/5 LUE Assessment LUE Assessment: Within Functional Limits Active Range of Motion (AROM) Comments: [facility]  Care Tool Care Tool Self Care Eating   Eating Assist Level: Set up assist    Oral Care         Bathing   Body parts bathed by patient: Right arm;Left upper leg;Left arm;Right lower leg;Chest;Left lower leg;Abdomen;Face;Front perineal area;Buttocks;Right upper leg     Assist Level: Minimal Assistance - Patient > 75%    Upper Body Dressing(including orthotics)   What is the patient wearing?: Pull over shirt   Assist Level: Supervision/Verbal cueing    Lower Body Dressing (excluding footwear)   What is the patient wearing?: Underwear/pull up;Pants Assist for lower body dressing: Minimal Assistance - Patient > 75%    Putting on/Taking off footwear     Assist for footwear: Contact Guard/Touching assist       Care Tool Toileting Toileting activity   Assist for toileting: Minimal Assistance - Patient > 75%     Care Tool Bed Mobility Roll left and right activity   Roll left and right assist level: Supervision/Verbal cueing    Sit to lying activity    Sit to lying assist level: Supervision/Verbal cueing    Lying to sitting on side of bed activity   Lying to sitting on side of bed assist level: the ability to move from lying on the back to sitting on the side of the bed with no back support.: Supervision/Verbal cueing     Care Tool Transfers Sit to stand transfer   Sit to stand assist level: Contact Guard/Touching assist    Chair/bed transfer   Chair/bed transfer assist level: Minimal Assistance - Patient > 75%     Toilet transfer   Assist Level: Minimal Assistance - Patient > 75%     Care Tool Cognition  Expression of Ideas and Wants Expression of Ideas and Wants: 4. Without difficulty (complex and basic) - expresses complex messages  without difficulty and with speech that is clear and easy to understand  Understanding Verbal and Non-Verbal Content Understanding Verbal and Non-Verbal Content: 4. Understands (complex and basic) - clear comprehension without cues or repetitions   Memory/Recall Ability Memory/Recall Ability : Current season;Location of own room;Staff names and faces;That he or she is in a hospital/hospital unit   Refer to Care Plan for Long Term Goals  SHORT TERM GOAL WEEK 1 OT Short Term Goal 1 (Week 1): patient will complete toileting / toileting transfer CGA with increased attention to the R with min verbal cueing OT Short Term Goal 2 (Week 1): patient will complete grooming standing at sink with SUP to increase dynamic balance OT Short Term Goal 3 (Week 1): patient will complete LB dressing CGA  Recommendations for other services: None    Skilled Therapeutic Intervention   Evaluation completed (see details above) with education on OT POC and goals and individual treatment initiated with focus on functional mobility/transfers, ADL re-training,  generalized strengthening and endurance, dynamic standing balance/coordination, pt/family education and discharge planning. Pt education provided on therapy schedule  and safety policy with use of chair alarm. Pt participated in goal setting. Pt participated in modified ADL task (See above for functional performance). Patient received in recliner. Able to complete LB dressing/ UB dressing/ toileting with urinal from recliner height with assist levels listed below. Patient able to complete functional mobility min A with verbal cues on walker height. Patient required increased rest breaks due to decreased activity tolerance. Returned to recliner all needs in reach.      ADL ADL Eating: Set up Where Assessed-Eating: Chair Grooming: Supervision/safety Where Assessed-Grooming: Sitting at sink Upper Body Bathing: Supervision/safety Where Assessed-Upper Body Bathing: Sitting at sink Lower Body Bathing: Minimal assistance Where Assessed-Lower Body Bathing: Sitting at sink Upper Body Dressing: Supervision/safety Where Assessed-Upper Body Dressing: Chair Lower Body Dressing: Minimal assistance Where Assessed-Lower Body Dressing: Chair Toileting: Minimal assistance Where Assessed-Toileting: Teacher, Adult Education: Minimal Dentist Method: Ambulating;Stand Wellsite Geologist: Raised toilet seat Film/video Editor: Minimal assistance;Moderate assistance Film/video Editor Method: Information Systems Manager with back Mobility  Transfers Sit to Stand: Minimal Assistance - Patient > 75% Stand to Sit: Minimal Assistance - Patient > 75%   Discharge Criteria: Patient will be discharged from OT if patient refuses treatment 3 consecutive times without medical reason, if treatment goals not met, if there is a change in medical status, if patient makes no progress towards goals or if patient is discharged from hospital.  The above assessment, treatment plan, treatment alternatives and goals were discussed and mutually agreed upon: by patient  D'mariea L Valeriano Bain 04/25/2024, 11:02 AM

## 2024-04-25 NOTE — Plan of Care (Signed)
" °  Problem: RH Grooming Goal: LTG Patient will perform grooming w/assist,cues/equip (OT) Description: LTG: Patient will perform grooming with assist, with/without cues using equipment (OT) Flowsheets (Taken 04/25/2024 1325) LTG: Pt will perform grooming with assistance level of: Independent with assistive device    Problem: RH Bathing Goal: LTG Patient will bathe all body parts with assist levels (OT) Description: LTG: Patient will bathe all body parts with assist levels (OT) Flowsheets (Taken 04/25/2024 1325) LTG: Pt will perform bathing with assistance level/cueing: Supervision/Verbal cueing   Problem: RH Dressing Goal: LTG Patient will perform upper body dressing (OT) Description: LTG Patient will perform upper body dressing with assist, with/without cues (OT). Flowsheets (Taken 04/25/2024 1325) LTG: Pt will perform upper body dressing with assistance level of: Independent with assistive device Goal: LTG Patient will perform lower body dressing w/assist (OT) Description: LTG: Patient will perform lower body dressing with assist, with/without cues in positioning using equipment (OT) Flowsheets (Taken 04/25/2024 1325) LTG: Pt will perform lower body dressing with assistance level of: Supervision/Verbal cueing   Problem: RH Toileting Goal: LTG Patient will perform toileting task (3/3 steps) with assistance level (OT) Description: LTG: Patient will perform toileting task (3/3 steps) with assistance level (OT)  Flowsheets (Taken 04/25/2024 1325) LTG: Pt will perform toileting task (3/3 steps) with assistance level: Supervision/Verbal cueing   Problem: RH Functional Use of Upper Extremity Goal: LTG Patient will use RT/LT upper extremity as a (OT) Description: LTG: Patient will use right/left upper extremity as a stabilizer/gross assist/diminished/nondominant/dominant level with assist, with/without cues during functional activity (OT) Flowsheets (Taken 04/25/2024 1325) LTG: Use of upper  extremity in functional activities: RUE as nondominant level LTG: Pt will use upper extremity in functional activity with assistance level of: Independent with assistive device   Problem: RH Toilet Transfers Goal: LTG Patient will perform toilet transfers w/assist (OT) Description: LTG: Patient will perform toilet transfers with assist, with/without cues using equipment (OT) Flowsheets (Taken 04/25/2024 1325) LTG: Pt will perform toilet transfers with assistance level of: Supervision/Verbal cueing   "

## 2024-04-25 NOTE — Plan of Care (Signed)
  Problem: Education: Goal: Knowledge of discharge needs will improve Outcome: Progressing   Problem: Clinical Measurements: Goal: Postoperative complications will be avoided or minimized Outcome: Progressing   Problem: Respiratory: Goal: Will achieve and/or maintain a regular respiratory rate, without signs or symptoms of dyspnea Outcome: Progressing   Problem: Skin Integrity: Goal: Demonstration of wound healing without infection will improve Outcome: Progressing

## 2024-04-25 NOTE — Progress Notes (Addendum)
 Inpatient Rehabilitation Admission Medication Review by a Pharmacist  A complete drug regimen review was completed for this patient to identify any potential clinically significant medication issues.  High Risk Drug Classes Is patient taking? Indication by Medication  Antipsychotic No   Anticoagulant No   Antibiotic No   Opioid No   Antiplatelet Yes Asa/plavix -cva ppx  Hypoglycemics/insulin  Yes Insulin - DM  Vasoactive Medication Yes Midodrine - hypotension Flomax - BPH  Chemotherapy No   Other Yes Lipitor - HLD Prozac - MDD     Type of Medication Issue Identified Description of Issue Recommendation(s)  Drug Interaction(s) (clinically significant)     Duplicate Therapy     Allergy     No Medication Administration End Date     Incorrect Dose     Additional Drug Therapy Needed     Significant med changes from prior encounter (inform family/care partners about these prior to discharge).    Other       Clinically significant medication issues were identified that warrant physician communication and completion of prescribed/recommended actions by midnight of the next day:  No   Time spent performing this drug regimen review (minutes):  30  Thank you. Olam Monte, PharmD  Contact: [phone] after 3 PM

## 2024-04-25 NOTE — Evaluation (Signed)
 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 s/p MI in 2005 (symptom of indigestion); b. CABG x 3 in 3/09: L-LAD, S-OM, EF unknown   Colon polyps    Diabetes mellitus    DJD (degenerative joint disease)    HLD (hyperlipidemia)    HTN (hypertension)    MI (myocardial infarction) (HCC) 2005   manifested by indigestion   Murmur    echo 6/12:  Upper septal thickening, no LVOT gradient,, EF 65%, mild LAE     Past Surgical History:  Past Surgical History:  Procedure Laterality Date   CHOLECYSTECTOMY  2002   CORONARY ARTERY BYPASS GRAFT  2009   x3   CORONARY/GRAFT ACUTE MI REVASCULARIZATION N/A 07/25/2020   Procedure: Coronary/Graft Acute MI Revascularization;  Surgeon: Jordan, Peter M, MD;  Location: [facility] INVASIVE CV LAB;  Service: Cardiovascular;  Laterality: N/A;   LEFT HEART CATH AND CORS/GRAFTS ANGIOGRAPHY N/A 07/25/2020   Procedure: LEFT HEART CATH AND CORS/GRAFTS ANGIOGRAPHY;  Surgeon: Jordan, Peter M, MD;  Location: [facility] INVASIVE CV LAB;  Service: Cardiovascular;  Laterality: N/A;   TONSILLECTOMY     TRANSCAROTID ARTERY REVASCULARIZATION  Left 04/21/2024   Procedure: LEFT TRANSCAROTID ARTERY REVASCULARIZATION (TCAR);  Surgeon: [name], MD;  Location: [facility] OR;  Service: Vascular;  Laterality: Left;    Assessment / Plan / Recommendation Clinical Impression Pt is a 78 year old left  handed male with history significant for diabetes mellitus, hypertension, hyperlipidemia, 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, tobacco use, pancytopenia followed by oncology [name] and being worked up for possible MDS. Per chart review patient lives with wife. Independent with cane/rolling walker. Wife and granddaughter can assist as needed. Presented 04/15/2024 after a fall when he tried to get up from bed around 10 AM. He lost his balance and fell onto the floor and laid there for approxi-1 hour before being found by family member. Family reported some right lower extremity weakness. CT/MRI showed punctate acute infarct in the high right frontal and left parasagittal frontal lobes. 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 Hospital course with bouts of hypotension currently on ProAmatine  and beta-blocker held. Patient did have 1 episode of urinary retention requiring an In-N-Out catheterization with 1 L retained 12/13 without recurrence and placed on Flomax . Findings of mildly elevated bilirubin imaging suggestive of mild cirrhosis plan outpatient workup. Therapy evaluations completed due to patient's decreased functional mobility was admitted for a comprehensive rehab program.    Cognitive-Linguistic:  Pt appeared to present w/ mild to moderate cognitive-linguistic deficits. He demonstrated adequate functional auditory comprehension; errors noted w/ complex directions only. Functional word finding and sentence formulation also appeared [facility] during simple structured and conversational tasks, though mild deficits noted w/ specific word finding and sentence formulation during tasks of increased complexity. He also completed the [facility] Mental Status (SLUMS) Exam, which revealed a score of 16/30 overall. This is improved compared to 13/30 on 12/11. Of note, majority  of errors noted were during [facility] tasks. Anticipate specific word finding deficits negatively  impacted this as he recalled recent events and therapies WFL. He also presented w/ mild deficits in the areas of sustained attention and complex problem solving. No concerns re motor speech production. Advanced chronic microvascular ischemic disease noted on MRI, though unsure of cognitive baseline at this time. Recommend skilled ST services targeting aforementioned deficits to maximize pt independence and reduce caregiver burden.   Bedside Swallow:  No concern re oropharyngeal swallow function at this time. Adequate mastication and no overt s/s of airway invasion noted w/ trials of Dys 3 textures and thin liquids. Missing upper dentition does negatively impact mastication overall, however, pt compensates well. He reporting eating soft foods at home and voiced no concern re his current diet. Recommend continuation of Dys 3 textures and thin liquids at this time. Dysphagia intervention not warranted.      Skilled Therapeutic Interventions          SLP facilitated a cognitive-linguistic evaluation and brief bedside swallow screen to assess pt's cognitive-communication skills and determine need for additional skilled ST services. See above for more information.    SLP Assessment  Patient will need skilled Speech Lanaguage Pathology Services during CIR admission    Recommendations  SLP Diet Recommendations: Dysphagia 3 (Mech soft);Thin Liquid Administration via: Straw Medication Administration: Whole meds with liquid Supervision: Patient able to self feed Postural Changes and/or Swallow Maneuvers: Seated upright 90 degrees;Upright 30-60 min after meal Recommendations for Other Services: Neuropsych consult Patient destination: Home Follow up Recommendations: Outpatient SLP;Home Health SLP (TBD re 24/7 supervision or not) Equipment Recommended: None recommended by SLP    SLP Frequency 3 to 5 out of 7 days   SLP Duration  SLP Intensity  SLP Treatment/Interventions 7-10 days  Minumum of 1-2  x/day, 30 to 90 minutes  Cueing hierarchy;Cognitive remediation/compensation;Speech/Language facilitation;Therapeutic Activities;Functional tasks;Internal/external aids;Patient/family education    Pain Pain Assessment Pain Scale: 0-10 Pain Score: 0-No pain  Prior Functioning Type of Home: House  Lives With: Spouse;Family (grandaughters - 2 teens, 3 great grandchildren) Available Help at Discharge: Family;Available 24 hours/day Vocation: Retired  Architectural Technologist Overall Cognitive Status: Impaired/Different from baseline Arousal/Alertness: Awake/alert Orientation Level: Oriented X4 Year: 2025 Month: December Day of Week: Correct Attention: Selective Sustained Attention: Appears intact Selective Attention: Impaired Selective Attention Impairment: Verbal complex;Functional basic Memory: Impaired Memory Impairment: Retrieval deficit;Decreased short term memory Awareness: Impaired Awareness Impairment: Emergent impairment Problem Solving: Impaired Problem Solving Impairment: Verbal complex;Functional basic;Functional complex Executive Function: Organizing;Sequencing Sequencing: Impaired Organizing: Impaired Self Correcting: Impaired Safety/Judgment: Impaired  Comprehension Auditory Comprehension Overall Auditory Comprehension: Impaired Yes/No Questions: Within Functional Limits Commands: Impaired One Step Basic Commands: 75-100% accurate Two Step Basic Commands: 75-100% accurate Complex Commands: 75-100% accurate Conversation: Complex Interfering Components: Attention EffectiveTechniques: Repetition;Stressing words Reading Comprehension Reading Status: Not tested Expression Expression Primary Mode of Expression: Verbal Verbal Expression Overall Verbal Expression: Impaired Initiation: No impairment Repetition: No impairment Naming: Impairment Responsive: 76-100% accurate Divergent: 75-100% accurate Verbal Errors: Semantic paraphasias;Phonemic  paraphasias Pragmatics: No impairment Written Expression Dominant Hand: Left Written Expression: Not tested Oral Motor Oral Motor/Sensory Function Overall Oral Motor/Sensory Function: Within functional limits Motor Speech Overall Motor Speech: Appears within functional limits for tasks assessed  Care Tool Care Tool Cognition Ability to hear (with hearing aid or hearing appliances if normally used Ability to hear (with hearing aid or hearing appliances if normally used): 0. Adequate - no difficulty in normal conservation, social interaction, listening to TV  Expression of Ideas and Wants Expression of Ideas and Wants: 3. Some difficulty - exhibits some difficulty with expressing needs and ideas (e.g, some words or finishing thoughts) or speech is not clear   Understanding Verbal and Non-Verbal Content Understanding Verbal and Non-Verbal Content: 3. Usually understands - understands most conversations, but misses some part/intent of message. Requires cues at times to understand  Memory/Recall Ability Memory/Recall Ability : Current season;That he or she is in a hospital/hospital unit   Motor Speech Assessment  WFL  Bedside Swallowing Assessment Thin Liquid Thin Liquid: Within functional limits Presentation: Straw Solid Solid: Within functional limits Presentation: Self Fed   Short Term Goals: Week 1: SLP Short Term Goal 1 (Week 1): STGs = LTGs d/t ELOS  Refer to Care Plan for Long Term Goals  Recommendations for other services: Neuropsych  Discharge Criteria: Patient will be discharged from SLP if patient refuses treatment 3 consecutive times without medical reason, if treatment goals not met, if there is a change in medical status, if patient makes no progress towards goals or if patient is discharged from hospital.  The above assessment, treatment plan, treatment alternatives and goals were discussed and mutually agreed upon: by patient  Christopher Roberts 04/25/2024, 3:34  PM

## 2024-04-25 NOTE — Evaluation (Signed)
 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 s/p MI in 2005 (symptom of indigestion); b. CABG x 3 in 3/09: L-LAD, S-OM, EF unknown   Colon polyps    Diabetes mellitus    DJD (degenerative joint disease)    HLD (hyperlipidemia)    HTN (hypertension)    MI (myocardial infarction) (HCC) 2005   manifested by indigestion   Murmur    echo 6/12:  Upper septal thickening, no LVOT gradient,, EF 65%, mild LAE     Past Surgical History:  Past Surgical History:  Procedure Laterality Date   CHOLECYSTECTOMY  2002   CORONARY ARTERY BYPASS GRAFT  2009   x3   CORONARY/GRAFT ACUTE MI REVASCULARIZATION N/A 07/25/2020   Procedure: Coronary/Graft Acute MI Revascularization;  Surgeon: Jordan, Peter M, MD;  Location: [facility] INVASIVE CV LAB;  Service: Cardiovascular;  Laterality: N/A;   LEFT HEART CATH AND CORS/GRAFTS ANGIOGRAPHY N/A 07/25/2020   Procedure: LEFT HEART CATH AND CORS/GRAFTS ANGIOGRAPHY;  Surgeon: Jordan, Peter M, MD;  Location: [facility] INVASIVE CV LAB;  Service: Cardiovascular;  Laterality: N/A;   TONSILLECTOMY     TRANSCAROTID ARTERY REVASCULARIZATION  Left 04/21/2024   Procedure: LEFT TRANSCAROTID ARTERY REVASCULARIZATION (TCAR);  Surgeon: [name], MD;  Location: [facility] OR;  Service: Vascular;  Laterality: Left;    Assessment & Plan Clinical Impression: Patient is a 78 y.o. year old male with recent admission to the hospital on ***  with ***.  Patient transferred to CIR on 04/24/2024 .   Patient currently requires {ONJ:6958369} with mobility secondary to {impairments:3041632}.  Prior to hospitalization, patient was {ONJ:6958369} with mobility and lived with Spouse, Family (grandaughters - 2 teens, 3 great grandchildren) in a House home.  Home access is  Level entry.  Patient will benefit from skilled PT intervention to {benefits:22816} for planned discharge {planned discharge:3041670}.  Anticipate patient will {follow le:6958327} at discharge.  PT - End of Session Activity Tolerance: Tolerates 30+ min activity with multiple rests PT Assessment Rehab Potential (ACUTE/IP ONLY): Good PT Barriers to Discharge: Home environment access/layout;Incontinence;Insurance for SNF coverage PT Patient demonstrates impairments in the following area(s): Balance;Endurance;Motor;Perception;Safety;Sensory PT Transfers Functional Problem(s): Bed Mobility;Bed to Chair;Car;Furniture PT Locomotion Functional Problem(s): Ambulation;Wheelchair Mobility;Stairs PT Plan PT Intensity: Minimum of 1-2 x/day ,45 to 90 minutes PT Frequency: 5 out of 7 days PT Duration Estimated Length of Stay: 12-16 days PT Treatment/Interventions: Ambulation/gait training;Community reintegration;DME/adaptive equipment instruction;Neuromuscular re-education;Psychosocial support;Stair training;UE/LE Strength taining/ROM;Balance/vestibular training;Discharge planning;Functional electrical stimulation;Therapeutic Activities;UE/LE Coordination activities;Therapeutic Exercise;Splinting/orthotics;Patient/family education;Functional mobility training;Disease management/prevention;Cognitive remediation/compensation PT Transfers Anticipated Outcome(s): supervision PT Locomotion Anticipated Outcome(s): supervision/ CGA PT Recommendation Recommendations for Other Services: Therapeutic Recreation consult Therapeutic Recreation Interventions: Kitchen group;Stress management Follow Up  Recommendations: Home health PT;Outpatient PT;24 hour supervision/assistance Patient destination: Home Equipment Recommended: To be determined   PT Evaluation Precautions/Restrictions Precautions Precautions: Fall Recall of Precautions/Restrictions: Impaired Precaution/Restrictions Comments: RLE weakness, RUE weakness Restrictions Weight Bearing Restrictions Per Provider Order: No General   Vital Signs  Pain Pain Assessment Pain Scale: 0-10  Pain Score: 0-No pain Pain Interference Pain Interference Pain Effect on Sleep: 0. Does not apply - I have not had any pain or hurting in the past 5 days Pain Interference with Therapy Activities: 0. Does not apply - I have not received rehabilitationtherapy in the past 5 days Pain Interference with Day-to-Day Activities: 1. Rarely or not at all Home Living/Prior Functioning Home Living Living Arrangements: Spouse/significant other;Other relatives Available Help at Discharge: Family;Available 24 hours/day Type of Home: House Home Access: Level entry Home Layout: Two level;Bed/bath upstairs;1/2 bath on main level Alternate Level Stairs-Number of Steps: 12 Alternate Level Stairs-Rails: Can reach both;Left;Right Bathroom Shower/Tub: Walk-in shower;Other (comment) (has built-in seat that he already uses; uses RW to get in/ out of shower) Bathroom Toilet: Standard Bathroom Accessibility: Yes Additional Comments: Pt lives with wife who is able to assist as needed. Pt has teen granddaughters who live there too who can help  Lives With: Spouse;Family (grandaughters - 2 teens, 3 great grandchildren) Prior Function Level of Independence: Independent with basic ADLs;Independent with homemaking with ambulation;Independent with gait;Independent with transfers;Requires assistive device for independence  Able to Take Stairs?: Yes Driving: Yes (not much and not at night) Vocation: Retired Vision/Perception  Vision - History Ability to See in Adequate  Light: 0 Adequate Perception Perception: Within Functional Limits Praxis Praxis: WFL  Cognition Overall Cognitive Status: Within Functional Limits for tasks assessed Arousal/Alertness: Awake/alert Orientation Level: Oriented X4 Attention: Sustained Sustained Attention: Appears intact Memory: Appears intact Awareness: Impaired Problem Solving: Impaired Executive Function: Sequencing;Self Correcting Sequencing: Impaired Self Correcting: Impaired Safety/Judgment: Impaired Sensation Sensation Light Touch: Impaired by gross assessment (RLE) Proprioception: Impaired by gross assessment (RLE) Coordination Gross Motor Movements are Fluid and Coordinated: No Fine Motor Movements are Fluid and Coordinated: No Finger Nose Finger Test: [facility] Motor  Motor Motor: Motor impersistence;Hemiplegia Motor - Skilled Clinical Observations: decreased proprioception of RLE during mobility; decreased sensation in RLE from hip to knee as compared to LLE   Trunk/Postural Assessment  Cervical Assessment Cervical Assessment: Exceptions to [facility] (forward head) Thoracic Assessment Thoracic Assessment: Exceptions to [facility] (forward flexed with rounded shoulders) Lumbar Assessment Lumbar Assessment: Exceptions to [facility] (posterior pelvic tilt) Postural Control Postural Control: Deficits on evaluation Protective Responses: delayed Postural Limitations: difficulty with upright posture; flexed with fatigue and decreased awareness  Balance Balance Balance Assessed: Yes Static Sitting Balance Static Sitting - Balance Support: Feet supported Static Sitting - Level of Assistance: 6: Modified independent (Device/Increase time) Dynamic Sitting Balance Dynamic Sitting - Balance Support: During functional activity Dynamic Sitting - Level of Assistance: 5: Stand by assistance Dynamic Sitting - Balance Activities: Forward lean/weight shifting;Reaching for objects Sitting balance - Comments: sitting up on edge of  bed Static Standing Balance Static Standing - Balance Support: Bilateral upper extremity supported;During functional activity Static Standing - Level of Assistance: Other (comment) (CGA) Dynamic Standing Balance Dynamic Standing - Balance Support: During functional activity;Bilateral upper extremity supported Dynamic Standing - Level of Assistance: 4: Min assist Dynamic Standing - Balance Activities: Lateral lean/weight shifting;Forward lean/weight shifting Extremity Assessment  RUE Assessment RUE Assessment: Exceptions to [facility] Active Range of Motion (AROM) Comments: AROM WFL General Strength Comments: 4-/5 LUE Assessment LUE Assessment: Within Functional Limits Active Range of Motion (AROM) Comments: WFL RLE Assessment RLE Assessment: Exceptions to [facility] RLE Strength Right Hip Flexion: 3-/5 Right Hip Extension: 3-/5 Right Hip ABduction: 3/5 Right Hip ADduction: 3/5 Right Knee Flexion: 3+/5 Right Knee Extension: 3+/5 Right Ankle Dorsiflexion: 4-/5 Right Ankle Plantar Flexion: 4-/5 LLE Assessment LLE Assessment:  Exceptions to [facility] LLE Strength Left Hip Flexion: 4/5 Left Hip Extension: 4/5 Left Hip ADduction: 4/5 Left Knee Flexion: 4/5 Left Knee Extension: 4/5 Left Ankle Dorsiflexion: 4+/5 Left Ankle Plantar Flexion: 4+/5  Care Tool Care Tool Bed Mobility Roll left and right activity   Roll left and right assist level: Supervision/Verbal cueing    Sit to lying activity   Sit to lying assist level: Supervision/Verbal cueing    Lying to sitting on side of bed activity   Lying to sitting on side of bed assist level: the ability to move from lying on the back to sitting on the side of the bed with no back support.: Supervision/Verbal cueing     Care Tool Transfers Sit to stand transfer   Sit to stand assist level: Minimal Assistance - Patient > 75%    Chair/bed transfer   Chair/bed transfer assist level: Minimal Assistance - Patient > 75%    Car transfer Car transfer  activity did not occur: Safety/medical concerns        Care Tool Locomotion Ambulation   Assist level: Minimal Assistance - Patient > 75% Assistive device: Walker-rolling Max distance: 10 ft  Walk 10 feet activity   Assist level: Minimal Assistance - Patient > 75% Assistive device: Walker-rolling   Walk 50 feet with 2 turns activity Walk 50 feet with 2 turns activity did not occur: Safety/medical concerns      Walk 150 feet activity Walk 150 feet activity did not occur: Safety/medical concerns      Walk 10 feet on uneven surfaces activity Walk 10 feet on uneven surfaces activity did not occur: Safety/medical concerns      Stairs Stair activity did not occur: Safety/medical concerns        Walk up/down 1 step activity Walk up/down 1 step or curb (drop down) activity did not occur: Safety/medical concerns      Walk up/down 4 steps activity Walk up/down 4 steps activity did not occur: Safety/medical concerns      Walk up/down 12 steps activity Walk up/down 12 steps activity did not occur: Safety/medical concerns      Pick up small objects from floor Pick up small object from the floor (from standing position) activity did not occur: Safety/medical concerns      Wheelchair Is the patient using a wheelchair?: Yes Type of Wheelchair: Manual   Wheelchair assist level: Dependent - Patient 0% Max wheelchair distance: 15 ft  Wheel 50 feet with 2 turns activity   Assist Level: Dependent - Patient 0%  Wheel 150 feet activity   Assist Level: Dependent - Patient 0%    Refer to Care Plan for Long Term Goals  SHORT TERM GOAL WEEK 1 PT Short Term Goal 1 (Week 1): Pt will perform bed mobility with Mod I and improved initiation/ effort. PT Short Term Goal 2 (Week 1): Pt will perform sit<>stand transfers with overall CGA to no AD. PT Short Term Goal 3 (Week 1): Pt will perform stand pivot transfers with overall CGA using LRAD. PT Short Term Goal 4 (Week 1): Pt will ambulate at  least 40 ft using LRAD with CGA. PT Short Term Goal 5 (Week 1): Pt will complete stair navigation training.  Recommendations for other services: {RECOMMENDATIONS FOR OTHER SERVICES:3049016}  Skilled Therapeutic Intervention Mobility Transfers Transfers: Sit to Stand;Stand to Sit;Stand Pivot Transfers Sit to Stand: Minimal Assistance - Patient > 75% Stand to Sit: Minimal Assistance - Patient > 75% Stand Pivot Transfers: Minimal Assistance - Patient >  75% Stand Pivot Transfer Details: Tactile cues for sequencing;Verbal cues for precautions/safety;Verbal cues for sequencing;Verbal cues for technique;Verbal cues for safe use of DME/AE Transfer (Assistive device): Standard walker Locomotion  Gait Ambulation: Yes Gait Assistance: Minimal Assistance - Patient > 75% Gait Distance (Feet): 10 Feet Assistive device: Rolling walker Gait Assistance Details: Tactile cues for sequencing;Verbal cues for sequencing;Verbal cues for precautions/safety;Verbal cues for technique;Verbal cues for safe use of DME/AE;Verbal cues for gait pattern Gait Gait: Yes Gait Pattern: Impaired Gait Pattern: Step-to pattern;Decreased step length - right;Decreased hip/knee flexion - right;Decreased dorsiflexion - right;Decreased weight shift to right;Right flexed knee in stance;Poor foot clearance - right Gait velocity: significantly decreased Stairs / Additional Locomotion Stairs: No Wheelchair Mobility Wheelchair Mobility: No   Discharge Criteria: Patient will be discharged from PT if patient refuses treatment 3 consecutive times without medical reason, if treatment goals not met, if there is a change in medical status, if patient makes no progress towards goals or if patient is discharged from hospital.  The above assessment, treatment plan, treatment alternatives and goals were discussed and mutually agreed upon: {Assessment/Treatment Plan Discussed/Agreed:3049017}  Mliss DELENA Milliner 04/25/2024, 12:58 PM

## 2024-04-26 LAB — GLUCOSE, CAPILLARY
Glucose-Capillary: 129 mg/dL — ABNORMAL HIGH (ref 70–99)
Glucose-Capillary: 199 mg/dL — ABNORMAL HIGH (ref 70–99)
Glucose-Capillary: 143 mg/dL — ABNORMAL HIGH (ref 70–99)
Glucose-Capillary: 167 mg/dL — ABNORMAL HIGH (ref 70–99)

## 2024-04-26 NOTE — Progress Notes (Signed)
 "                                                        PROGRESS NOTE   Subjective/Complaints: No new complaints this morning Watching TV Denies pain BP is soft  ROS- denies CP, SOB, N/V/D  Objective:   No results found. Recent Labs    04/24/24 0403 04/25/24 0600  WBC 2.1* 2.1*  HGB 8.5* 8.7*  HCT 24.9* 25.2*  PLT 57* 59*   Recent Labs    04/24/24 0403 04/25/24 0600  NA 138 139  K 4.0 4.0  CL 107 108  CO2 24 23  GLUCOSE 115* 102*  BUN 20 21  CREATININE 0.97 1.00  CALCIUM  8.4* 8.7*    Intake/Output Summary (Last 24 hours) at 04/26/2024 1519 Last data filed at 04/26/2024 1314 Gross per 24 hour  Intake 354 ml  Output 150 ml  Net 204 ml        Physical Exam: Vital Signs Blood pressure 108/60, pulse 79, temperature 98.7 F (37.1 C), temperature source Oral, resp. rate 16, height 5' 10 (1.778 m), weight 72.5 kg, SpO2 100%.  General: No acute distress, sitting in chair watching TV Mood and affect are appropriate Heart: Regular rate and rhythm no rubs murmurs or extra sounds Lungs: Clear to auscultation, breathing unlabored, no rales or wheezes Abdomen: Positive bowel sounds, soft nontender to palpation, nondistended Extremities: No clubbing, cyanosis, or edema Skin: No evidence of breakdown, no evidence of rash  PRIOR EXAMS: Neurologic: Cranial nerves II through XII intact, motor strength is 5/5 in left and 4/5 right deltoid, bicep, tricep, grip, hip flexor, knee extensors, ankle dorsiflexor and plantar flexor Sensory exam normal sensation to light touch  in bilateral upper and left lower lower extremities Reduced LT RLE Oriented to person and place not time Cerebellar exam mild dysmetria Right finger nose finger Musculoskeletal: Full range of motion in all 4 extremities. No joint swelling    Assessment/Plan: 1. Functional deficits which require 3+ hours per day of interdisciplinary therapy in a comprehensive inpatient rehab setting. Physiatrist is  providing close team supervision and 24 hour management of active medical problems listed below. Physiatrist and rehab team continue to assess barriers to discharge/monitor patient progress toward functional and medical goals  Care Tool:  Bathing    Body parts bathed by patient: Right arm, Left upper leg, Left arm, Right lower leg, Chest, Left lower leg, Abdomen, Face, Front perineal area, Buttocks, Right upper leg         Bathing assist Assist Level: Minimal Assistance - Patient > 75%     Upper Body Dressing/Undressing Upper body dressing   What is the patient wearing?: Pull over shirt    Upper body assist Assist Level: Supervision/Verbal cueing    Lower Body Dressing/Undressing Lower body dressing      What is the patient wearing?: Underwear/pull up, Pants     Lower body assist Assist for lower body dressing: Minimal Assistance - Patient > 75%     Toileting Toileting    Toileting assist Assist for toileting: Minimal Assistance - Patient > 75%     Transfers Chair/bed transfer  Transfers assist     Chair/bed transfer assist level: Minimal Assistance - Patient > 75%     Locomotion Ambulation   Ambulation assist  Assist level: Minimal Assistance - Patient > 75% Assistive device: Walker-rolling Max distance: 10 ft   Walk 10 feet activity   Assist     Assist level: Minimal Assistance - Patient > 75% Assistive device: Walker-rolling   Walk 50 feet activity   Assist Walk 50 feet with 2 turns activity did not occur: Safety/medical concerns         Walk 150 feet activity   Assist Walk 150 feet activity did not occur: Safety/medical concerns         Walk 10 feet on uneven surface  activity   Assist Walk 10 feet on uneven surfaces activity did not occur: Safety/medical concerns         Wheelchair     Assist Is the patient using a wheelchair?: Yes Type of Wheelchair: Manual    Wheelchair assist level: Dependent - Patient  0% Max wheelchair distance: 15 ft    Wheelchair 50 feet with 2 turns activity    Assist        Assist Level: Dependent - Patient 0%   Wheelchair 150 feet activity     Assist      Assist Level: Dependent - Patient 0%   Blood pressure 108/60, pulse 79, temperature 98.7 F (37.1 C), temperature source Oral, resp. rate 16, height 5' 10 (1.778 m), weight 72.5 kg, SpO2 100%.  Medical Problem List and Plan: 1. 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  Antithrombotics: -DVT/anticoagulation:  Mechanical: Antiembolism stockings, thigh (TED hose) Bilateral lower extremities             -antiplatelet therapy: Aspirin  81 mg daily and Plavix  75 mg daily 3. Pain Management: Tylenol  as needed 4. Mood/Behavior/Sleep: Prozac  20 mg daily.             -antipsychotic agents: N/A 5. Neuropsych/cognition: This patient is capable of making decisions on his own behalf. 6. Skin/Wound Care: Routine skin checks 7. Fluids/Electrolytes/Nutrition: Routine and analysis with follow-up chemistries  8.  Symptomatic left carotid artery stenosis.  Status post stenting/angioplasty 04/21/2024 per Dr. Gretta  9.  Hypotension: continue ProAmatine  10 mg 3 times daily.  Monitor with increased mobility  10.  Diabetes mellitus.  Hemoglobin A1c 5.6.  Currently on SSI.  Prior to admission patient on Glucophage  500 mg daily.  Resume as needed CBG (last 3)  Recent Labs    04/25/24 2054 04/26/24 0617 04/26/24 1132  GLUCAP 147* 129* 199*    11.  Hyperlipidemia: continue Lipitor   12.  History of CAD/PCI/CABG/SVT.  Follow-up [name] outpatient  13.  Pancytopenia.  Heme oncology Dr.Pasam outpatient.  Follow-up CBC    Latest Ref Rng & Units 04/25/2024    6:00 AM  04/24/2024    4:03 AM 04/23/2024    9:24 AM  CBC  WBC 4.0 - 10.5 K/uL 2.1  2.1  2.5   Hemoglobin 13.0 - 17.0 g/dL 8.7  8.5  9.0   Hematocrit 39.0 - 52.0 % 25.2  24.9  26.4   Platelets 150 - 400 K/uL 59  57  53    No heparin  for DVT prophyllaxis, monitor  for bleeding on DAPT transfuse if PLT<20K or Hgb <7.0  14.  Cirrhosis of liver.  Imaging suggestive of cirrhosis.  Bili mildly elevated as well as INR 1.3.  Follow-up outpatient  15.  Urinary retention.  In-N-Out catheterization 12/13 x 1.  Placed on Flomax . Messaged nursing to discuss her PVRs.  16.  Constipation: continue Colace 100 mg daily    LOS: 2 days A FACE TO FACE EVALUATION WAS PERFORMED  Sven SQUIBB Teagan Heidrick 04/26/2024, 3:19 PM     "

## 2024-04-26 NOTE — Progress Notes (Signed)
 Speech Language Pathology Daily Session Note  Patient Details  Name: Christopher Roberts MRN: [id number] Date of Birth: [birth year]  Today's Date: 04/26/2024 SLP Individual Time: 1100-1158 SLP Individual Time Calculation (min): 58 min  Short Term Goals: Week 1: SLP Short Term Goal 1 (Week 1): STGs = LTGs d/t ELOS  Skilled Therapeutic Interventions: Patient was seen in am to address cognitive re- training. Pt was alert and seated upright in recliner upon SLP arrival. He was agreeable for session. Pt oriented to time, place, and situation. He indep recalled participation in PT session earlier this am. Pt does endorse some changes in memory as a result of stroke. He also identified physical deficits indep. SLP introduced 'BE FAST' stroke symptoms. After a delay, pt identified 2 stroke symptoms with mod a. SLP challenged pt in medication management as this was a task he completed PTA. He identified medications taken PTA with sup A though required mod A to identify rationale. He does report that while he filled out pill box his wife reminded him to take meds PTA. Pt completed a BID pill box with 1 error demonstrating some reduced awareness of deficits. At conclusion of session, he was left upright in recliner with call button within reach and chair alarm active. SLP to continue POC.   Pain Pain Assessment Pain Scale: 0-10 Pain Score: 0-No pain  Therapy/Group: Individual Therapy  Joane GORMAN Fuss 04/26/2024, 11:08 AM

## 2024-04-26 NOTE — Progress Notes (Signed)
 Occupational Therapy Session Note  Patient Details  Name: Christopher Roberts MRN: [id number] Date of Birth: 1946/03/02  Today's Date: 04/26/2024 OT Individual Time: 0215-0315 OT Individual Time Calculation (min): 60 min    Short Term Goals: Week 1:  OT Short Term Goal 1 (Week 1): patient will complete toileting / toileting transfer CGA with increased attention to the R with min verbal cueing OT Short Term Goal 2 (Week 1): patient will complete grooming standing at sink with SUP to increase dynamic balance OT Short Term Goal 3 (Week 1): patient will complete LB dressing CGA  Skilled Therapeutic Interventions/Progress Updates:      Initial Encounter: The patient was seated in the recliner watching TV at the time of arrival.The pt indicated that the he rested okay during the night, with no pain to report at the time of treatment. The pt was in agreement with going to the gym to work on UB strengthening to improve his functional outcome.   UB Strengthening: The pt was able to complete the Nustep for BUE for 13 minutes  in duration with 2 rest break.  The pt went on to complete the squigz in standing by alternating hands and retrieving the pieces based on the color called out, the pt requred  1 rest breaks.  The pt went on to complete sit to stands 3 times with 1 rest break The pt completed the session using the scifit in standing for chasing pattern while alternating hands.  At the end of the session, the pt was transported back to his room and was able to transfer from w/c LOF to the recliner using the arm of the w/c and the recliner at [facility]  incorporating momentum.  Prior to me exiting the room,  the call light and bedside table were placed within reach with all additional needs addressed  Therapy Documentation Precautions:  Precautions Precautions: Fall Recall of Precautions/Restrictions: Impaired Precaution/Restrictions Comments: RLE weakness, RUE weakness Restrictions Weight Bearing  Restrictions Per Provider Order: No  Therapy/Group: Individual Therapy  TRAXTON KOLENDA 04/26/2024, 3:43 PM

## 2024-04-26 NOTE — Progress Notes (Signed)
 Physical Therapy Session Note  Patient Details  Name: Christopher Roberts MRN: [id number] Date of Birth: 05-23-1945  Today's Date: 04/26/2024 PT Individual Time: 0918-1017 PT Individual Time Calculation (min): 59 min   Short Term Goals: Week 1:  PT Short Term Goal 1 (Week 1): Pt will perform bed mobility with Mod I and improved initiation/ effort. PT Short Term Goal 2 (Week 1): Pt will perform sit<>stand transfers with overall CGA to no AD. PT Short Term Goal 3 (Week 1): Pt will perform stand pivot transfers with overall CGA using LRAD. PT Short Term Goal 4 (Week 1): Pt will ambulate at least 40 ft using LRAD with CGA. PT Short Term Goal 5 (Week 1): Pt will complete stair navigation training.  Skilled Therapeutic Interventions/Progress Updates:  Patient supine in bed on entrance to room. Patient alert and agreeable to PT session.   Patient with no pain complaint at start of session.  Therapeutic Activity: Bed Mobility: Pt performed supine > sit with supervision. VC/ tc required for technique and effort. Mild dizziness that resolves in seated position on EOB. Dresses while seated EOB with supervision.   Performs squat pivot transfer to w/c to pt's R side with MinA. Pt taken to ortho gym dependently via w/c to conserve energy. Performed car transfer from w/c using RW with CGA/ MinA. Uses UE to assist RLE into/ out of car.   On exit of car, pt is able to ambulate 12 feet to mat table using RW and CGA/ MinA. Demonstrates RLE flexion during stance phase that increases with fatigue.   Pt then instructed and guided in proper setup and technique for squat pivot transfer mat table <>w/c. Improves to performance from MinA to Min / CGA.   Pt taken to therapy steps in main gym. Guided in NMR to improve RLE activation/ motor control. Is able to place R foot on step with improved clearance with performance. Has strength to rise up to 6 step 1x5 with minA improving to CGA. Seated rest break, then pt  performs 1x5 again with decreasing strength and increased use of BUE. Fatigue prevents further reps.   Pt wheeled back to room and able to perform ambulatory transfer to recliner over 5 feet. Performed with MinA/ CGA and vc for safe pivot turn and descent to sit.   Patient seated upright with BLE elevated at end of session with brakes locked, no alarm set, and all needs within reach. Tray table with breakfast place in front of him. NT informed as to pt's disposition and need for alarm belt.    Therapy Documentation Precautions:  Precautions Precautions: Fall Recall of Precautions/Restrictions: Impaired Precaution/Restrictions Comments: RLE weakness, RUE weakness Restrictions Weight Bearing Restrictions Per Provider Order: No  Pain: Pain Assessment Pain Scale: 0-10 Pain Score: 0-No pain  Therapy/Group: Individual Therapy  Mliss DELENA Milliner PT, DPT, CSRS 04/26/2024, 1:33 PM

## 2024-04-26 NOTE — Progress Notes (Signed)
 Occupational Therapy Session Note  Patient Details  Name: RUFFIN LADA MRN: [id number] Date of Birth: 06-25-1945  {CHL IP REHAB OT TIME CALCULATIONS:[id number]}   Short Term Goals: Week 1:  OT Short Term Goal 1 (Week 1): patient will complete toileting / toileting transfer CGA with increased attention to the R with min verbal cueing OT Short Term Goal 2 (Week 1): patient will complete grooming standing at sink with SUP to increase dynamic balance OT Short Term Goal 3 (Week 1): patient will complete LB dressing CGA  Skilled Therapeutic Interventions/Progress Updates:    Patient agreeable to participate in OT session. Reports *** pain level.   Patient participated in skilled OT session focusing on ***. Therapist facilitated/assessed/developed/educated/integrated/elicited *** in order to improve/facilitate/promote    Therapy Documentation Precautions:  Precautions Precautions: Fall Recall of Precautions/Restrictions: Impaired Precaution/Restrictions Comments: RLE weakness, RUE weakness Restrictions Weight Bearing Restrictions Per Provider Order: No  Therapy/Group: Individual Therapy  Leita Howell, OTR/L,CBIS  Supplemental OT - [facility] and [facility] Secure Chat Preferred   04/26/2024, 11:01 PM

## 2024-04-27 DIAGNOSIS — I959 Hypotension, unspecified: Secondary | ICD-10-CM

## 2024-04-27 DIAGNOSIS — R339 Retention of urine, unspecified: Secondary | ICD-10-CM

## 2024-04-27 DIAGNOSIS — D61818 Other pancytopenia: Secondary | ICD-10-CM

## 2024-04-27 DIAGNOSIS — R739 Hyperglycemia, unspecified: Secondary | ICD-10-CM

## 2024-04-27 DIAGNOSIS — K5901 Slow transit constipation: Secondary | ICD-10-CM

## 2024-04-27 LAB — GLUCOSE, CAPILLARY
Glucose-Capillary: 125 mg/dL — ABNORMAL HIGH (ref 70–99)
Glucose-Capillary: 146 mg/dL — ABNORMAL HIGH (ref 70–99)
Glucose-Capillary: 174 mg/dL — ABNORMAL HIGH (ref 70–99)
Glucose-Capillary: 139 mg/dL — ABNORMAL HIGH (ref 70–99)

## 2024-04-27 MED ORDER — POLYETHYLENE GLYCOL 3350 17 G PO PACK
17.0000 g | PACK | Freq: Every day | ORAL | Status: DC | PRN
  Administered 2024-05-11: 17 g via ORAL

## 2024-04-27 MED ORDER — GUAIFENESIN-DM 100-10 MG/5ML PO SYRP: 10.0000 mL | ORAL_SOLUTION | Freq: Four times a day (QID) | ORAL | Status: DC | PRN

## 2024-04-27 MED ORDER — PROCHLORPERAZINE MALEATE 5 MG PO TABS: 5.0000 mg | ORAL_TABLET | Freq: Four times a day (QID) | ORAL | Status: DC | PRN

## 2024-04-27 MED ORDER — ALUM & MAG HYDROXIDE-SIMETH 200-200-20 MG/5ML PO SUSP: 30.0000 mL | ORAL | Status: DC | PRN

## 2024-04-27 MED ORDER — MELATONIN 5 MG PO TABS
5.0000 mg | ORAL_TABLET | Freq: Every evening | ORAL | Status: DC | PRN
  Administered 2024-05-04 – 2024-05-08 (×3): 5 mg via ORAL
  Filled 2024-04-27: qty 1

## 2024-04-27 NOTE — Progress Notes (Signed)
 "                                                        PROGRESS NOTE   Subjective/Complaints:  Pt doing well, slept well, denies pain, LBM yesterday in brief per pt but none documented, urinating fine. No other complaints or concerns.   ROS- denies CP, SOB, abd pain, N/V/D/C  Objective:   No results found. Recent Labs    04/25/24 0600  WBC 2.1*  HGB 8.7*  HCT 25.2*  PLT 59*   Recent Labs    04/25/24 0600  NA 139  K 4.0  CL 108  CO2 23  GLUCOSE 102*  BUN 21  CREATININE 1.00  CALCIUM  8.7*    Intake/Output Summary (Last 24 hours) at 04/27/2024 1055 Last data filed at 04/27/2024 0834 Gross per 24 hour  Intake 354 ml  Output 325 ml  Net 29 ml        Physical Exam: Vital Signs Blood pressure 102/61, pulse 68, temperature 98.8 F (37.1 C), temperature source Oral, resp. rate 17, height 5' 10 (1.778 m), weight 72.5 kg, SpO2 99%.  General: No acute distress, resting comfortably in bed. Mood and affect are appropriate Heart: Regular rate and rhythm no rubs murmurs or extra sounds Lungs: Clear to auscultation, breathing unlabored, no rales or wheezes Abdomen: Positive bowel sounds, soft nontender to palpation, nondistended Extremities: No clubbing, cyanosis, or edema Skin: No evidence of breakdown, no evidence of rash to exposed surfaces, scattered bruises, L cartoid surgical incision c/d/i  PRIOR EXAMS: Neurologic: Cranial nerves II through XII intact, motor strength is 5/5 in left and 4/5 right deltoid, bicep, tricep, grip, hip flexor, knee extensors, ankle dorsiflexor and plantar flexor Sensory exam normal sensation to light touch  in bilateral upper and left lower lower extremities Reduced LT RLE Oriented to person and place not time Cerebellar exam mild dysmetria Right finger nose finger Musculoskeletal: Full range of motion in all 4 extremities. No joint swelling    Assessment/Plan: 1. Functional deficits which require 3+ hours per day of  interdisciplinary therapy in a comprehensive inpatient rehab setting. Physiatrist is providing close team supervision and 24 hour management of active medical problems listed below. Physiatrist and rehab team continue to assess barriers to discharge/monitor patient progress toward functional and medical goals  Care Tool:  Bathing    Body parts bathed by patient: Right arm, Left upper leg, Left arm, Right lower leg, Chest, Left lower leg, Abdomen, Face, Front perineal area, Buttocks, Right upper leg         Bathing assist Assist Level: Minimal Assistance - Patient > 75%     Upper Body Dressing/Undressing Upper body dressing   What is the patient wearing?: Pull over shirt    Upper body assist Assist Level: Supervision/Verbal cueing    Lower Body Dressing/Undressing Lower body dressing      What is the patient wearing?: Underwear/pull up, Pants     Lower body assist Assist for lower body dressing: Minimal Assistance - Patient > 75%     Toileting Toileting    Toileting assist Assist for toileting: Minimal Assistance - Patient > 75%     Transfers Chair/bed transfer  Transfers assist     Chair/bed transfer assist level: Minimal Assistance - Patient > 75%     Locomotion Ambulation  Ambulation assist      Assist level: Minimal Assistance - Patient > 75% Assistive device: Walker-rolling Max distance: 10 ft   Walk 10 feet activity   Assist     Assist level: Minimal Assistance - Patient > 75% Assistive device: Walker-rolling   Walk 50 feet activity   Assist Walk 50 feet with 2 turns activity did not occur: Safety/medical concerns         Walk 150 feet activity   Assist Walk 150 feet activity did not occur: Safety/medical concerns         Walk 10 feet on uneven surface  activity   Assist Walk 10 feet on uneven surfaces activity did not occur: Safety/medical concerns         Wheelchair     Assist Is the patient using a  wheelchair?: Yes Type of Wheelchair: Manual    Wheelchair assist level: Dependent - Patient 0% Max wheelchair distance: 15 ft    Wheelchair 50 feet with 2 turns activity    Assist        Assist Level: Dependent - Patient 0%   Wheelchair 150 feet activity     Assist      Assist Level: Dependent - Patient 0%   Blood pressure 102/61, pulse 68, temperature 98.8 F (37.1 C), temperature source Oral, resp. rate 17, height 5' 10 (1.778 m), weight 72.5 kg, SpO2 99%.  Medical Problem List and Plan: 1. 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  Antithrombotics: -DVT/anticoagulation:  Mechanical: Antiembolism stockings, thigh (TED hose) Bilateral lower extremities             -antiplatelet therapy: Aspirin  81 mg daily and Plavix  75 mg daily 3. Pain Management: Tylenol  as needed 4. Mood/Behavior/Sleep: Prozac  20 mg daily.             -antipsychotic agents: N/A  -melatonin prn 5. Neuropsych/cognition: This patient is capable of making decisions on his own behalf. 6. Skin/Wound Care: Routine skin checks 7. Fluids/Electrolytes/Nutrition: Routine and analysis with follow-up chemistries  8.  Symptomatic left carotid artery stenosis.  Status post stenting/angioplasty 04/21/2024 per Dr. Gretta  9.  Hypotension: continue ProAmatine  10 mg 3 times daily.  Monitor with increased mobility  -04/27/24 BPs stable, monitor Vitals:   04/24/24 1702 04/24/24 2008 04/25/24 0551 04/25/24 1509  BP: 118/70 97/64 (!) 106/47 125/61   04/25/24 2024 04/26/24 0518 04/26/24 1340 04/26/24 1342  BP: 115/67 (!) 106/58 (!) 97/56 108/60   04/26/24 2015 04/27/24 0424  BP: 121/67 102/61    10.  Diabetes mellitus.  Hemoglobin A1c 5.6.  Currently on SSI.  Prior to admission  patient on Glucophage  500 mg daily.  Resume as needed -04/27/24 CBGs mostly fine, very infrequent >200 readings; could consider restarting metformin  this week? CBG (last 3)  Recent Labs    04/26/24 1620 04/26/24 2154 04/27/24 0636  GLUCAP 143* 167* 125*    11.  Hyperlipidemia: continue Lipitor  80mg  daily  12.  History of CAD/PCI/CABG/SVT.  Follow-up [name] outpatient  13.  Pancytopenia.  Heme oncology Dr.Pasam outpatient.  Follow-up CBC -No heparin  for DVT prophyllaxis,  monitor for bleeding on DAPT transfuse if PLT<20K or Hgb <7.0    Latest Ref Rng & Units 04/25/2024    6:00 AM 04/24/2024    4:03 AM 04/23/2024    9:24 AM  CBC  WBC 4.0 - 10.5 K/uL 2.1  2.1  2.5   Hemoglobin 13.0 - 17.0 g/dL 8.7  8.5  9.0   Hematocrit 39.0 - 52.0 % 25.2  24.9  26.4   Platelets 150 - 400 K/uL 59  57  53     14.  Cirrhosis of liver.  Imaging suggestive of cirrhosis.  Bili mildly elevated as well as INR 1.3.  Follow-up outpatient  15.  Urinary retention.  In-N-Out catheterization 12/13 x 1.  Placed on Flomax . Messaged nursing to discuss her PVRs. -04/27/24 flomax  d/c'd yesterday, no PVRs documented, will discuss with nursing-- ordered PVRs  16.  Constipation: continue Colace 100 mg daily  -04/27/24 LBM yesterday per pt, not documented; monitor.     LOS: 3 days A FACE TO FACE EVALUATION WAS PERFORMED  [address] Tyrene Nader 04/27/2024, 10:55 AM     "

## 2024-04-27 NOTE — Progress Notes (Signed)
 Physical Therapy Session Note  Patient Details  Name: Christopher Roberts MRN: [id number] Date of Birth: Sep 05, 1945  Today's Date: 04/27/2024 PT Individual Time: 0905-1000 PT Individual Time Calculation (min): 55 min   Short Term Goals: Week 1:  PT Short Term Goal 1 (Week 1): Pt will perform bed mobility with Mod I and improved initiation/ effort. PT Short Term Goal 2 (Week 1): Pt will perform sit<>stand transfers with overall CGA to no AD. PT Short Term Goal 3 (Week 1): Pt will perform stand pivot transfers with overall CGA using LRAD. PT Short Term Goal 4 (Week 1): Pt will ambulate at least 40 ft using LRAD with CGA. PT Short Term Goal 5 (Week 1): Pt will complete stair navigation training.  Skilled Therapeutic Interventions/Progress Updates:    pt received in bed and agreeable to therapy. No complaint of pain.  Pt able to assist with donning ted hose, pulling over knees. Bed mobility with bed features and supervision. Pt then dressed with increased time, donning pants, shirt, and shoes with supervision.  Pt reports no c/o dizziness with repeated standing while dressing and attempting to use urinal, but did become fatigued.   Pt transported to therapy gym for time management and energy conservation. Pt then participated in gait training, initially 4 x 32 ft. Demoed poor foot clearance and decr stance time on RLE, especially with fatigue. Required cues for RW proximity and upright posture. Pt limited by all over fatigue and shortness of breath. O2 sats found to drop as low as 92 after activity, rebounding to 95+ in several minutes with deep breathing. On last bout, pt self reports taking deep breaths, noted improved oxygen levels after. Discussed not talking during activity and focusing on breath support for safety with mobility.   Pt propelled w/c ~120 ft with supervision for cardiovascular endurance. Transported remaining distance for time. Stand pivot transfer with CGA and RW, bed mobility  with supervision. Pt remained in bed and was left with all needs in reach and alarm active.   Therapy Documentation Precautions:  Precautions Precautions: Fall Recall of Precautions/Restrictions: Impaired Precaution/Restrictions Comments: RLE weakness, RUE weakness Restrictions Weight Bearing Restrictions Per Provider Order: No General:     Therapy/Group: Individual Therapy  [name] 04/27/2024, 9:33 AM

## 2024-04-27 NOTE — Plan of Care (Signed)
" °  Problem: RH Balance Goal: LTG Patient will maintain dynamic standing balance (PT) Description: LTG:  Patient will maintain dynamic standing balance with assistance during mobility activities (PT) Flowsheets (Taken 04/25/2024 1019) LTG: Pt will maintain dynamic standing balance during mobility activities with:: Supervision/Verbal cueing   Problem: Sit to Stand Goal: LTG:  Patient will perform sit to stand with assistance level (PT) Description: LTG:  Patient will perform sit to stand with assistance level (PT) Flowsheets (Taken 04/25/2024 1019) LTG: PT will perform sit to stand in preparation for functional mobility with assistance level: Independent with assistive device   Problem: RH Bed Mobility Goal: LTG Patient will perform bed mobility with assist (PT) Description: LTG: Patient will perform bed mobility with assistance, with/without cues (PT). Flowsheets (Taken 04/25/2024 1019) LTG: Pt will perform bed mobility with assistance level of: Independent with assistive device    Problem: RH Bed to Chair Transfers Goal: LTG Patient will perform bed/chair transfers w/assist (PT) Description: LTG: Patient will perform bed to chair transfers with assistance (PT). Flowsheets (Taken 04/25/2024 1019) LTG: Pt will perform Bed to Chair Transfers with assistance level: Supervision/Verbal cueing   Problem: RH Car Transfers Goal: LTG Patient will perform car transfers with assist (PT) Description: LTG: Patient will perform car transfers with assistance (PT). Flowsheets (Taken 04/25/2024 1019) LTG: Pt will perform car transfers with assist:: Supervision/Verbal cueing   Problem: RH Furniture Transfers Goal: LTG Patient will perform furniture transfers w/assist (OT/PT) Description: LTG: Patient will perform furniture transfers  with assistance (OT/PT). Flowsheets (Taken 04/25/2024 1019) LTG: Pt will perform furniture transfers with assist:: Supervision/Verbal cueing   Problem: RH  Ambulation Goal: LTG Patient will ambulate in controlled environment (PT) Description: LTG: Patient will ambulate in a controlled environment, # of feet with assistance (PT). Flowsheets (Taken 04/25/2024 1019) LTG: Pt will ambulate in controlled environ  assist needed:: Supervision/Verbal cueing LTG: Ambulation distance in controlled environment: more than 100 ft using LRAD Goal: LTG Patient will ambulate in home environment (PT) Description: LTG: Patient will ambulate in home environment, # of feet with assistance (PT). Flowsheets (Taken 04/25/2024 1019) LTG: Pt will ambulate in home environ  assist needed:: Supervision/Verbal cueing LTG: Ambulation distance in home environment: at least 50 ft using LRAD   Problem: RH Stairs Goal: LTG Patient will ambulate up and down stairs w/assist (PT) Description: LTG: Patient will ambulate up and down # of stairs with assistance (PT) Flowsheets (Taken 04/25/2024 1019) LTG: Pt will ambulate up/down stairs assist needed:: Supervision/Verbal cueing LTG: Pt will  ambulate up and down number of stairs: at least one flight of stairs as per home environment with Bil HR   "

## 2024-04-27 NOTE — Progress Notes (Signed)
 Speech Language Pathology Daily Session Note  Patient Details  Name: Christopher Roberts MRN: [id number] Date of Birth: 1945-07-07  Today's Date: 04/27/2024 SLP Individual Time: 8761-8664 SLP Individual Time Calculation (min): 57 min  Short Term Goals: Week 1: SLP Short Term Goal 1 (Week 1): STGs = LTGs d/t ELOS  Skilled Therapeutic Interventions: Pt seen for skilled SLP session to address cognitive goals including problem solving, attention, and sequencing. Pt participated in novel card game involving sequencing from 1-10 with additional strategic rules. Pt required moderate verbal cues with initial round; however, needed fewer cues with subsequent rounds and was completing the game with supervision level assistance by the final round; demonstrating good carryover. Pt left sitting upright in w/c with breaks locked and chair alarm engaged. Call bell in reach. Continue SLP PoC.   Pain Pain Assessment Pain Scale: 0-10 Pain Score: 0-No pain  Therapy/Group: Individual Therapy  Christopher Roberts 04/27/2024, 1:55 PM

## 2024-04-28 LAB — CBC
HCT: 24.2 % — ABNORMAL LOW (ref 39.0–52.0)
Hemoglobin: 8.2 g/dL — ABNORMAL LOW (ref 13.0–17.0)
MCH: 30.3 pg (ref 26.0–34.0)
MCHC: 33.9 g/dL (ref 30.0–36.0)
MCV: 89.3 fL (ref 80.0–100.0)
Platelets: 54 K/uL — ABNORMAL LOW (ref 150–400)
RBC: 2.71 MIL/uL — ABNORMAL LOW (ref 4.22–5.81)
RDW: 18.6 % — ABNORMAL HIGH (ref 11.5–15.5)
WBC: 2.4 K/uL — ABNORMAL LOW (ref 4.0–10.5)
nRBC: 0 % (ref 0.0–0.2)

## 2024-04-28 LAB — GLUCOSE, CAPILLARY
Glucose-Capillary: 123 mg/dL — ABNORMAL HIGH (ref 70–99)
Glucose-Capillary: 135 mg/dL — ABNORMAL HIGH (ref 70–99)
Glucose-Capillary: 134 mg/dL — ABNORMAL HIGH (ref 70–99)
Glucose-Capillary: 91 mg/dL (ref 70–99)

## 2024-04-28 LAB — BASIC METABOLIC PANEL WITH GFR
Anion gap: 8 (ref 5–15)
BUN: 23 mg/dL (ref 8–23)
CO2: 21 mmol/L — ABNORMAL LOW (ref 22–32)
Calcium: 8.3 mg/dL — ABNORMAL LOW (ref 8.9–10.3)
Chloride: 106 mmol/L (ref 98–111)
Creatinine, Ser: 1.01 mg/dL (ref 0.61–1.24)
GFR, Estimated: >60 mL/min
Glucose, Bld: 121 mg/dL — ABNORMAL HIGH (ref 70–99)
Potassium: 4.1 mmol/L (ref 3.5–5.1)
Sodium: 136 mmol/L (ref 135–145)

## 2024-04-28 NOTE — Progress Notes (Signed)
 Occupational Therapy Session Note  Patient Details  Name: Christopher Roberts MRN: [id number] Date of Birth: Jun 10, 1945  Session 1 Today's Date: 04/28/2024 OT Individual Time: 1003-1058 OT Individual Time Calculation (min): 55 min   Session 2 Today's Date: 04/28/2024 OT Individual Time: 1405-1503 OT Individual Time Calculation (min): 58 min   Short Term Goals: Week 1:  OT Short Term Goal 1 (Week 1): patient will complete toileting / toileting transfer CGA with increased attention to the R with min verbal cueing OT Short Term Goal 2 (Week 1): patient will complete grooming standing at sink with SUP to increase dynamic balance OT Short Term Goal 3 (Week 1): patient will complete LB dressing CGA  Skilled Therapeutic Interventions/Progress Updates:     AM Session: Pt received resting in bed presenting to be in good spirits receptive to skilled OT session reporting 0/10 pain- OT offering intermittent rest breaks, repositioning, and therapeutic support to optimize participation in therapy session. Pt declining ADLs this session. Donned thigh high TEDs MAX A at beginning of session. He transitioned to EOB SUP using bed rail. Stand pivot EOB > wc using RW light MIN A for balance and to power up to stand. Dizziness reported in standing. Positioned Pt at sink for oral care and grooming tasks- Pt able to locate toothbrush and tooth paste on R side of sink without verbal cues. Transported Pt to therapy gym in wc total A for energy conservation. Stand pivot w/c > EOM using RW CGA +verbal cues for RW positioning and technique. Increased dizziness reported following transfer, however improved symptoms with seated rest break. Engaged Pt in completing the following exercises seated EOB to work on GMC, R UE strength, trunk control, and activity tolerance:   -Bicep curls, 3x10 reps, 3# weighted dowel  -Chest press, 3x10 reps, 3# weighted dowel  -Anterior shoulder flexion, 3x10 reps, 3# weighted dowel  -Ball passes  behind the back, 2x10 reps C/CCW, 2/2# weighted ball OT providing verbal cues and education on PLB'ing, Pt demo'ing teach back as evidence of learning. Increased time required for rest breaks between and following each exercise 2/2 endurance deficit.  Stand pivot EOM > wc using RW MIN A for balance. Transported Pt back to room total A in wc for energy conservation. Pt requesting to use restroom at end of session. He completed short distance functional mobility using RW with light MIN A provided for balance and RW management. Able to doff pants with CGA. Handed Pt off to NT to address toileting needs.   PM Session:  Pt received siting up in wc presenting to be in good spirits receptive to skilled OT session reporting 0/10 pain- OT offering intermittent rest breaks, repositioning, and therapeutic support to optimize participation in therapy session.   Pt reporting feeling less dizzy this PM vs AM session. Completed assessment for orthostatic hypotension at beginning of session d/t potential cause for dizziness symptoms.   Vitals seated: HR 70 BP 110/68(83) SPO2 98%  Vitals standing: HR 100/57(68) SPO2 97% Pt with (+) dropping in BP. Educated Pt on wearing TEDs, increasing fluid intake, and sitting up between therapy sessions for BP management.   Transported Pt to therapy gym total A in wc.   Engaged Pt in functional mobility training using RW to work on activity tolerance and to simulate ambulating household distances. He completed 3 bouts of ~30 ft with CGA +increased time d/t slowed cadence with CGA provided for balance. MIN verbal cues required for hand placement during sit<>stands and seated rest  break provided between each bout.   Pt participated in series of dynamic standing balance activities on BITs to work on standing tolerance, releasing single UE support from RW during functional reaching, and facilitate visual scanning to R. Pt able to tolerate standing for bouts of 30 sec to 2.5 min with  seated rest breaks following and maintain dynamic standing balance with SUP-CGA overall. Improved attention to R visual field with Pt able to locate stimuli during activities without verbal cues required.   Pt participated in B U/LE endurance training on NuStep to increase overall activity tolerance for ADLs. He completed 10 min on level 4 setting with rest break following.   Engaged Pt in completing functional mobility back to his room for endurance training using RW for balance. He was able to complete ~100 ft with CGA +increased time- heavy reliance on B UE support on RW noted requiring MOD verbal cues to correct. He requested to use restroom, transferring to standard toilet using RW with CGA. Provided increased time on toilet to allow for BM and handed off Pt to nursing staff to address toileting needs.    Therapy Documentation Precautions:  Precautions Precautions: Fall Recall of Precautions/Restrictions: Impaired Precaution/Restrictions Comments: RLE weakness, RUE weakness Restrictions Weight Bearing Restrictions Per Provider Order: No   Therapy/Group: Individual Therapy  Katheryn SHAUNNA [place] 04/28/2024, 7:21 AM

## 2024-04-28 NOTE — Progress Notes (Signed)
 Speech Language Pathology Daily Session Note  Patient Details  Name: SORREN VALLIER MRN: [id number] Date of Birth: 1945-07-27  Today's Date: 04/28/2024 SLP Individual Time: 1250-1335 SLP Individual Time Calculation (min): 45 min  Short Term Goals: Week 1: SLP Short Term Goal 1 (Week 1): STGs = LTGs d/t ELOS  Skilled Therapeutic Interventions: Skilled therapy session focused on cognitive goals. SLP facilitated session by prompting patient to share information re: self, family and medical hx. Patient independently recalled changes in physical ability, speech and cognition. Patient oriented to self, situation, location and time. SLP targeted problem solving skills through simple money management task. Patient counted change according to verbalized amount with 90% accuracy independently. SLP continued to challenge patient through functional mathematical word problems. Patient required minA to complete. Patient left in [facility] with alarm set and call bell in reach. Continue POC   Pain denies  Therapy/Group: Individual Therapy  Paiton Fosco M.A., CCC-SLP 04/28/2024, 7:40 AM

## 2024-04-28 NOTE — Plan of Care (Signed)
" °  Problem: Consults Goal: RH STROKE PATIENT EDUCATION Description: See Patient Education module for education specifics  Outcome: Progressing Goal: Diabetes Guidelines if Diabetic/Glucose > 140 Description: If diabetic or lab glucose is > 140 mg/dl - Initiate Diabetes/Hyperglycemia Guidelines & Document Interventions  Outcome: Progressing   Problem: RH BOWEL ELIMINATION Goal: RH STG MANAGE BOWEL WITH ASSISTANCE Description: STG Manage Bowel with mod I Assistance. Outcome: Progressing Goal: RH STG MANAGE BOWEL W/MEDICATION W/ASSISTANCE Description: STG Manage Bowel with Medication with mod I Assistance. Outcome: Progressing   Problem: RH BLADDER ELIMINATION Goal: RH STG MANAGE BLADDER WITH ASSISTANCE Description: STG Manage Bladder With mod I Assistance Outcome: Progressing Goal: RH STG MANAGE BLADDER WITH MEDICATION WITH ASSISTANCE Description: STG Manage Bladder With Medication With mod I Assistance. Outcome: Progressing   Problem: RH KNOWLEDGE DEFICIT Goal: RH STG INCREASE KNOWLEDGE OF DIABETES Description: Patient and spouse will be able to manage DM using educational resources for medications and dietary modification independently Outcome: Progressing Goal: RH STG INCREASE KNOWLEDGE OF HYPERTENSION Description: Patient and spouse will be able to manage HTN using educational resources for medications and dietary modification independently Outcome: Progressing Goal: RH STG INCREASE KNOWLEDGE OF DYSPHAGIA/FLUID INTAKE Description: Patient and spouse will be able to manage Dysphagia using educational resources for medications and dietary modification independently Outcome: Progressing Goal: RH STG INCREASE KNOWLEGDE OF HYPERLIPIDEMIA Description: Patient and spouse will be able to manage HLD using educational resources for medications and dietary modification independently Outcome: Progressing Goal: RH STG INCREASE KNOWLEDGE OF STROKE PROPHYLAXIS Description: Patient and spouse  will be able to manage secondary risks using educational resources for medications and dietary modification independently Outcome: Progressing   "

## 2024-04-28 NOTE — Progress Notes (Signed)
 "                                                        PROGRESS NOTE   Subjective/Complaints:  umented, urinating fine. No other complaints or concerns.   ROS- denies CP, SOB, abd pain, N/V/D/C  Objective:   No results found. Recent Labs    04/28/24 0528  WBC 2.4*  HGB 8.2*  HCT 24.2*  PLT 54*   Recent Labs    04/28/24 0528  NA 136  K 4.1  CL 106  CO2 21*  GLUCOSE 121*  BUN 23  CREATININE 1.01  CALCIUM  8.3*    Intake/Output Summary (Last 24 hours) at 04/28/2024 0844 Last data filed at 04/28/2024 0600 Gross per 24 hour  Intake 709 ml  Output 650 ml  Net 59 ml        Physical Exam: Vital Signs Blood pressure 107/66, pulse 67, temperature 98.3 F (36.8 C), temperature source Oral, resp. rate 16, height 5' 10 (1.778 m), weight 76.6 kg, SpO2 100%.  General: No acute distress, resting comfortably in bed. Mood and affect are appropriate Heart: Regular rate and rhythm no rubs murmurs or extra sounds Lungs: Clear to auscultation, breathing unlabored, no rales or wheezes Abdomen: Positive bowel sounds, soft nontender to palpation, nondistended Extremities: No clubbing, cyanosis, or edema Skin: No evidence of breakdown, no evidence of rash to exposed surfaces, scattered bruises, L cartoid surgical incision c/d/i  PRIOR EXAMS: Neurologic: Cranial nerves II through XII intact, motor strength is 5/5 in left and 5/5 right deltoid, bicep, tricep, grip, 4/5 hip flexor, knee extensors, ankle dorsiflexor and plantar flexor Sensory exam normal sensation to light touch  in bilateral upper and left lower lower extremities Reduced LT RLE Oriented to person and place not time Cerebellar exam mild dysmetria Right finger nose finger Musculoskeletal: Full range of motion in all 4 extremities. No joint swelling    Assessment/Plan: 1. Functional deficits which require 3+ hours per day of interdisciplinary therapy in a comprehensive inpatient rehab setting. Physiatrist is  providing close team supervision and 24 hour management of active medical problems listed below. Physiatrist and rehab team continue to assess barriers to discharge/monitor patient progress toward functional and medical goals  Care Tool:  Bathing    Body parts bathed by patient: Right arm, Left upper leg, Left arm, Right lower leg, Chest, Left lower leg, Abdomen, Face, Front perineal area, Buttocks, Right upper leg         Bathing assist Assist Level: Minimal Assistance - Patient > 75%     Upper Body Dressing/Undressing Upper body dressing   What is the patient wearing?: Pull over shirt    Upper body assist Assist Level: Supervision/Verbal cueing    Lower Body Dressing/Undressing Lower body dressing      What is the patient wearing?: Underwear/pull up, Pants     Lower body assist Assist for lower body dressing: Minimal Assistance - Patient > 75%     Toileting Toileting    Toileting assist Assist for toileting: Minimal Assistance - Patient > 75%     Transfers Chair/bed transfer  Transfers assist     Chair/bed transfer assist level: Minimal Assistance - Patient > 75%     Locomotion Ambulation   Ambulation assist      Assist level: Minimal Assistance -  Patient > 75% Assistive device: Walker-rolling Max distance: 10 ft   Walk 10 feet activity   Assist     Assist level: Minimal Assistance - Patient > 75% Assistive device: Walker-rolling   Walk 50 feet activity   Assist Walk 50 feet with 2 turns activity did not occur: Safety/medical concerns         Walk 150 feet activity   Assist Walk 150 feet activity did not occur: Safety/medical concerns         Walk 10 feet on uneven surface  activity   Assist Walk 10 feet on uneven surfaces activity did not occur: Safety/medical concerns         Wheelchair     Assist Is the patient using a wheelchair?: Yes Type of Wheelchair: Manual    Wheelchair assist level: Dependent - Patient  0% Max wheelchair distance: 15 ft    Wheelchair 50 feet with 2 turns activity    Assist        Assist Level: Dependent - Patient 0%   Wheelchair 150 feet activity     Assist      Assist Level: Dependent - Patient 0%   Blood pressure 107/66, pulse 67, temperature 98.3 F (36.8 C), temperature source Oral, resp. rate 16, height 5' 10 (1.778 m), weight 76.6 kg, SpO2 100%.  Medical Problem List and Plan: 1. 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  Antithrombotics: -DVT/anticoagulation:  Mechanical: Antiembolism stockings, thigh (TED hose) Bilateral lower extremities             -antiplatelet therapy: Aspirin  81 mg daily and Plavix  75 mg daily 3. Pain Management: Tylenol  as needed 4. Mood/Behavior/Sleep: Prozac  20 mg daily.             -antipsychotic agents: N/A  -melatonin prn 5. Neuropsych/cognition: This patient is capable of making decisions on his own behalf. 6. Skin/Wound Care: Routine skin checks 7. Fluids/Electrolytes/Nutrition: Routine and analysis with follow-up chemistries  8.  Symptomatic left carotid artery stenosis.  Status post stenting/angioplasty 04/21/2024 per Dr. Gretta  9.  Hypotension: continue ProAmatine  10 mg 3 times daily.  Monitor with increased mobility  -04/27/24 BPs stable, monitor Vitals:   04/24/24 2008 04/25/24 0551 04/25/24 1509 04/25/24 2024  BP: 97/64 (!) 106/47 125/61 115/67   04/26/24 0518 04/26/24 1340 04/26/24 1342 04/26/24 2015  BP: (!) 106/58 (!) 97/56 108/60 121/67   04/27/24 0424 04/27/24 1347 04/27/24 2001 04/28/24 0546  BP: 102/61 120/69 102/83 107/66    10.  Diabetes mellitus.  Hemoglobin A1c 5.6.  Currently on SSI.  Prior to admission patient on Glucophage   500 mg daily.  Resume as needed CBGs ok if they start climbing above 180 will restart metformin   CBG (last 3)  Recent Labs    04/27/24 1613 04/27/24 2001 04/28/24 0547  GLUCAP 174* 139* 123*    11.  Hyperlipidemia: continue Lipitor  80mg  daily  12.  History of CAD/PCI/CABG/SVT.  Follow-up [name] outpatient  13.  Pancytopenia.  Heme oncology Dr.Pasam outpatient.  Follow-up CBC -No heparin  for DVT prophyllaxis, monitor  for bleeding on DAPT transfuse if PLT<20K or Hgb <7.0    Latest Ref Rng & Units 04/28/2024    5:28 AM 04/25/2024    6:00 AM 04/24/2024    4:03 AM  CBC  WBC 4.0 - 10.5 K/uL 2.4  2.1  2.1   Hemoglobin 13.0 - 17.0 g/dL 8.2  8.7  8.5   Hematocrit 39.0 - 52.0 % 24.2  25.2  24.9   Platelets 150 - 400 K/uL 54  59  57     14.  Cirrhosis of liver.  Imaging suggestive of cirrhosis.  Bili mildly elevated as well as INR 1.3.  Follow-up outpatient  15.  Urinary retention.  In-N-Out catheterization 12/13 x 1.  Placed on Flomax . Messaged nursing to discuss her PVRs. -04/27/24 flomax  d/c'd yesterday, no PVRs documented, will discuss with nursing-- ordered PVRs  16.  Constipation: continue Colace 100 mg daily  -04/27/24 LBM yesterday per pt, not documented; monitor.     LOS: 4 days A FACE TO FACE EVALUATION WAS PERFORMED  Christopher Roberts 04/28/2024, 8:44 AM     "

## 2024-04-28 NOTE — Progress Notes (Signed)
 Physical Therapy Session Note  Patient Details  Name: Christopher Roberts MRN: [id number] Date of Birth: 26-Apr-1946  Today's Date: 04/28/2024 PT Individual Time: 1100-1158 PT Individual Time Calculation (min): 58 min   Short Term Goals: Week 1:  PT Short Term Goal 1 (Week 1): Pt will perform bed mobility with Mod I and improved initiation/ effort. PT Short Term Goal 2 (Week 1): Pt will perform sit<>stand transfers with overall CGA to no AD. PT Short Term Goal 3 (Week 1): Pt will perform stand pivot transfers with overall CGA using LRAD. PT Short Term Goal 4 (Week 1): Pt will ambulate at least 40 ft using LRAD with CGA. PT Short Term Goal 5 (Week 1): Pt will complete stair navigation training.  Skilled Therapeutic Interventions/Progress Updates:  Patient seated upright in w/c on entrance to room. Patient alert and agreeable to PT session. Has just completed OT session and trying to warm up. Pt provided with sweatshirt and requires MinA to don.   Patient with no pain complaint at start of session.   Taken dependently to main therapy gym and guided in balance testing.   TUG completed in 67 seconds (A score of >13.5 seconds indicates patient is at a high fall risk) Pt educated on interpretation of their score.  Patient demonstrates increased fall risk as noted by score of  22/56 on Berg Balance Scale.  (<36= high risk for falls, close to 100%; 37-45 significant >80%; 46-51 moderate >50%; 52-55 lower >25%)  Of note pt with significant difficulty in NBOS items, and mobility without steady handhold.   Guided in gait training using RW with overall CGA provided this day. He is able to complete 145' x1/ 9' x1 with significant improvement noted in RLE strength with improved knee flexion during stance phase. Also slight improvement in upright posture. Pt also relates small improvement in proprioception of RLE.   Patient seated upright in w/c at end of session with brakes locked, belt alarm set, and  all needs within reach.   Therapy Documentation Precautions:  Precautions Precautions: Fall Recall of Precautions/Restrictions: Impaired Precaution/Restrictions Comments: RLE weakness, RUE weakness Restrictions Weight Bearing Restrictions Per Provider Order: No  Pain:  No pain related this session.   Balance: Standardized Balance Assessment Standardized Balance Assessment: Berg Balance Test;Timed Up and Go Test Berg Balance Test Sit to Stand: Able to stand using hands after several tries Standing Unsupported: Able to stand 30 seconds unsupported Sitting with Back Unsupported but Feet Supported on Floor or Stool: Able to sit safely and securely 2 minutes Stand to Sit: Controls descent by using hands Transfers: Needs one person to assist Standing Unsupported with Eyes Closed: Able to stand 10 seconds with supervision Standing Ubsupported with Feet Together: Needs help to attain position but able to stand for 30 seconds with feet together From Standing, Reach Forward with Outstretched Arm: Can reach forward >5 cm safely (2) From Standing Position, Pick up Object from Floor: Unable to try/needs assist to keep balance From Standing Position, Turn to Look Behind Over each Shoulder: Turn sideways only but maintains balance Turn 360 Degrees: Needs assistance while turning Standing Unsupported, Alternately Place Feet on Step/Stool: Needs assistance to keep from falling or unable to try Standing Unsupported, One Foot in Front: Able to take small step independently and hold 30 seconds Standing on One Leg: Unable to try or needs assist to prevent fall Total Score: 22 Timed Up and Go Test TUG: Normal TUG Normal TUG (seconds): 67   Therapy/Group: Individual Therapy  Mliss DELENA Milliner PT, DPT, CSRS 04/28/2024, 5:20 PM

## 2024-04-29 LAB — GLUCOSE, CAPILLARY
Glucose-Capillary: 113 mg/dL — ABNORMAL HIGH (ref 70–99)
Glucose-Capillary: 135 mg/dL — ABNORMAL HIGH (ref 70–99)
Glucose-Capillary: 116 mg/dL — ABNORMAL HIGH (ref 70–99)
Glucose-Capillary: 190 mg/dL — ABNORMAL HIGH (ref 70–99)

## 2024-04-29 NOTE — Progress Notes (Addendum)
 "                                                        PROGRESS NOTE   Subjective/Complaints:  2 BMs yesterday , urinating fine. No other complaints or concerns.   ROS- denies CP, SOB, abd pain, N/V/D/C  Objective:   No results found. Recent Labs    04/28/24 0528  WBC 2.4*  HGB 8.2*  HCT 24.2*  PLT 54*   Recent Labs    04/28/24 0528  NA 136  K 4.1  CL 106  CO2 21*  GLUCOSE 121*  BUN 23  CREATININE 1.01  CALCIUM  8.3*    Intake/Output Summary (Last 24 hours) at 04/29/2024 0849 Last data filed at 04/29/2024 0600 Gross per 24 hour  Intake 237 ml  Output 400 ml  Net -163 ml        Physical Exam: Vital Signs Blood pressure 112/65, pulse 65, temperature 98.6 F (37 C), temperature source Oral, resp. rate 18, height 5' 10 (1.778 m), weight 76.6 kg, SpO2 99%.  General: No acute distress, resting comfortably in bed. Mood and affect are appropriate Heart: Regular rate and rhythm no rubs murmurs or extra sounds Lungs: Clear to auscultation, breathing unlabored, no rales or wheezes Abdomen: Positive bowel sounds, soft nontender to palpation, nondistended Extremities: No clubbing, cyanosis, or edema Skin: No evidence of breakdown, no evidence of rash to exposed surfaces, scattered bruises, L cartoid surgical incision c/d/i  PRIOR EXAMS: Neurologic: Cranial nerves II through XII intact, motor strength is 5/5 in left and 5/5 right deltoid, bicep, tricep, grip, 4/5 hip flexor, knee extensors, ankle dorsiflexor and plantar flexor Sensory exam normal sensation to light touch  in bilateral upper and left lower lower extremities Reduced LT RLE Oriented to person and place not time Cerebellar exam mild dysmetria Right finger nose finger Musculoskeletal: Full range of motion in all 4 extremities. No joint swelling    Assessment/Plan: 1. Functional deficits which require 3+ hours per day of interdisciplinary therapy in a comprehensive inpatient rehab setting. Physiatrist  is providing close team supervision and 24 hour management of active medical problems listed below. Physiatrist and rehab team continue to assess barriers to discharge/monitor patient progress toward functional and medical goals  Care Tool:  Bathing    Body parts bathed by patient: Right arm, Left upper leg, Left arm, Right lower leg, Chest, Left lower leg, Abdomen, Face, Front perineal area, Buttocks, Right upper leg         Bathing assist Assist Level: Minimal Assistance - Patient > 75%     Upper Body Dressing/Undressing Upper body dressing   What is the patient wearing?: Pull over shirt    Upper body assist Assist Level: Supervision/Verbal cueing    Lower Body Dressing/Undressing Lower body dressing      What is the patient wearing?: Underwear/pull up, Pants     Lower body assist Assist for lower body dressing: Minimal Assistance - Patient > 75%     Toileting Toileting    Toileting assist Assist for toileting: Minimal Assistance - Patient > 75%     Transfers Chair/bed transfer  Transfers assist     Chair/bed transfer assist level: Minimal Assistance - Patient > 75%     Locomotion Ambulation   Ambulation assist      Assist level:  Minimal Assistance - Patient > 75% Assistive device: Walker-rolling Max distance: 10 ft   Walk 10 feet activity   Assist     Assist level: Minimal Assistance - Patient > 75% Assistive device: Walker-rolling   Walk 50 feet activity   Assist Walk 50 feet with 2 turns activity did not occur: Safety/medical concerns         Walk 150 feet activity   Assist Walk 150 feet activity did not occur: Safety/medical concerns         Walk 10 feet on uneven surface  activity   Assist Walk 10 feet on uneven surfaces activity did not occur: Safety/medical concerns         Wheelchair     Assist Is the patient using a wheelchair?: Yes Type of Wheelchair: Manual    Wheelchair assist level: Dependent -  Patient 0% Max wheelchair distance: 15 ft    Wheelchair 50 feet with 2 turns activity    Assist        Assist Level: Dependent - Patient 0%   Wheelchair 150 feet activity     Assist      Assist Level: Dependent - Patient 0%   Blood pressure 112/65, pulse 65, temperature 98.6 F (37 C), temperature source Oral, resp. rate 18, height 5' 10 (1.778 m), weight 76.6 kg, SpO2 99%.  Medical Problem List and Plan: 1. 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  Antithrombotics: -DVT/anticoagulation:  Mechanical: Antiembolism stockings, thigh (TED hose) Bilateral lower extremities             -antiplatelet therapy: Aspirin  81 mg daily and Plavix  75 mg daily 3. Pain Management: Tylenol  as needed 4. Mood/Behavior/Sleep: Prozac  20 mg daily.             -antipsychotic agents: N/A  -melatonin prn 5. Neuropsych/cognition: This patient is capable of making decisions on his own behalf. 6. Skin/Wound Care: Routine skin checks 7. Fluids/Electrolytes/Nutrition: Routine and analysis with follow-up chemistries  8.  Symptomatic left carotid artery stenosis.  Status post stenting/angioplasty 04/21/2024 per Dr. Gretta  9.  Hypotension: continue ProAmatine  10 mg 3 times daily.  Monitor with increased mobility  -04/27/24 BPs stable, monitor Vitals:   04/25/24 2024 04/26/24 0518 04/26/24 1340 04/26/24 1342  BP: 115/67 (!) 106/58 (!) 97/56 108/60   04/26/24 2015 04/27/24 0424 04/27/24 1347 04/27/24 2001  BP: 121/67 102/61 120/69 102/83   04/28/24 0546 04/28/24 0908 04/28/24 2006 04/29/24 0600  BP: 107/66 107/60 133/68 112/65    10.  Diabetes mellitus.  Hemoglobin A1c 5.6.  Currently on SSI.  Prior to admission patient on  Glucophage  500 mg daily.  Resume as needed CBGs ok if they start climbing above 180 will restart metformin   CBG (last 3)  Recent Labs    04/28/24 1658 04/28/24 2007 04/29/24 0651  GLUCAP 134* 91 113*    11.  Hyperlipidemia: continue Lipitor  80mg  daily  12.  History of CAD/PCI/CABG/SVT.  Follow-up [name] outpatient  13.  Pancytopenia.  Heme oncology Dr.Pasam outpatient.  Follow-up CBC -No  heparin  for DVT prophyllaxis, monitor for bleeding on DAPT transfuse if PLT<20K or Hgb <7.0    Latest Ref Rng & Units 04/28/2024    5:28 AM 04/25/2024    6:00 AM 04/24/2024    4:03 AM  CBC  WBC 4.0 - 10.5 K/uL 2.4  2.1  2.1   Hemoglobin 13.0 - 17.0 g/dL 8.2  8.7  8.5   Hematocrit 39.0 - 52.0 % 24.2  25.2  24.9   Platelets 150 - 400 K/uL 54  59  57     14.  Cirrhosis of liver.  Imaging suggestive of cirrhosis.  Bili mildly elevated as well as INR 1.3.  Follow-up outpatient  15.  Urinary retention.  In-N-Out catheterization 12/13 x 1.  Placed on Flomax . Messaged nursing to discuss her PVRs. -04/27/24 flomax  d/c'd yesterday, no PVRs documented, will discuss with nursing-- ordered PVRs  16.  Constipation: continue Colace 100 mg daily  -04/27/24 LBM yesterday per pt, not documented; monitor.     LOS: 5 days A FACE TO FACE EVALUATION WAS PERFORMED  Christopher Roberts 04/29/2024, 8:49 AM     "

## 2024-04-29 NOTE — Progress Notes (Addendum)
 Occupational Therapy Session Note  Patient Details  Name: Christopher Roberts MRN: [id number] Date of Birth: [birth year]  Today's Date: 04/29/2024 OT Individual Time: 8653-8556 OT Individual Time Calculation (min): 57 min  and Today's Date: 04/29/2024 OT Missed Time: 18 Minutes Missed Time Reason: Patient fatigue   Short Term Goals: Week 1:  OT Short Term Goal 1 (Week 1): patient will complete toileting / toileting transfer CGA with increased attention to the R with min verbal cueing OT Short Term Goal 2 (Week 1): patient will complete grooming standing at sink with SUP to increase dynamic balance OT Short Term Goal 3 (Week 1): patient will complete LB dressing CGA  Skilled Therapeutic Interventions/Progress Updates:     Pt received sitting up in wc presenting to be in good spirits receptive to skilled OT session reporting 0/10 pain- OT offering intermittent rest breaks, repositioning, and therapeutic support to optimize participation in therapy session. Pt requesting to take shower this PM. He completed ambulatory transfer to bathroom using RW with CGA and completed 3/3 toileting tasks seated on elevated toilet seat with SUP +increased time. Ambulatory transfer to walk-in shower completed using RW and grab bar CGA. Pt sat for majority of shower for energy conservation and briefly stood using grab bar close SUP to wash peri-areas. Following shower, Pt dried self with SUP +increased time. He completed functional mobility back to wc using RW with CGA. Donned [place] shirt with SUP and pants CGA when standing to bring pants to waist. Donned bilateral socks with set-up A. Pt chose to sit at sink to brush teeth for energy conservation completing ADL with SUP. Increased rest breaks required throughout session 2/2 endurance deficit. Improved recall of sit<>stand technique and hand placement. Pt fatigued following ADLs, requesting to return to bed and end session early. Stand pivot wc > EOB using RW CGA. EOB >  SUPINE SUP. Pt was left resting in bed with call bell in reach, bed alarm on, and all needs met.  Missed 18 min skilled OT treatment, will attempt to make up time as Pt's status and schedule allow.   Therapy Documentation Precautions:  Precautions Precautions: Fall Recall of Precautions/Restrictions: Impaired Precaution/Restrictions Comments: RLE weakness, RUE weakness Restrictions Weight Bearing Restrictions Per Provider Order: No   Therapy/Group: Individual Therapy  Katheryn SHAUNNA Mines 04/29/2024, 7:26 AM

## 2024-04-29 NOTE — Progress Notes (Signed)
 Speech Language Pathology Daily Session Note  Patient Details  Name: Christopher Roberts MRN: [id number] Date of Birth: 1945-06-30  Today's Date: 04/29/2024 SLP Individual Time: 0900-1000 SLP Individual Time Calculation (min): 60 min  Short Term Goals: Week 1: SLP Short Term Goal 1 (Week 1): STGs = LTGs d/t ELOS  Skilled Therapeutic Interventions: Skilled therapy session focused on cognitive goals. SLP assisted in dressing and transfer to [facility] from bed using RW. SLP facilitated session by prompting patient to complete Christmas themed tasks. Patient created Christmas ornament with supervisionA to attend to activity throughout. Patient then completed Christmas budgeting task to target problem solving. Patient required supervisionA to choose appropriate items fr gifts given age/gender and supervisionA to complete simple addition to ensure budget was kept. Patient left in [facility] with alarm set and call bell in reach. Continue POC  Pain None reported to SLP  Therapy/Group: Individual Therapy  Raevon Broom M.A., CCC-SLP 04/29/2024, 7:42 AM

## 2024-04-29 NOTE — Plan of Care (Signed)
" °  Problem: Consults Goal: RH STROKE PATIENT EDUCATION Description: See Patient Education module for education specifics  Outcome: Progressing Goal: Diabetes Guidelines if Diabetic/Glucose > 140 Description: If diabetic or lab glucose is > 140 mg/dl - Initiate Diabetes/Hyperglycemia Guidelines & Document Interventions  Outcome: Progressing   Problem: RH BOWEL ELIMINATION Goal: RH STG MANAGE BOWEL WITH ASSISTANCE Description: STG Manage Bowel with mod I Assistance. Outcome: Progressing Goal: RH STG MANAGE BOWEL W/MEDICATION W/ASSISTANCE Description: STG Manage Bowel with Medication with mod I Assistance. Outcome: Progressing   Problem: RH BLADDER ELIMINATION Goal: RH STG MANAGE BLADDER WITH ASSISTANCE Description: STG Manage Bladder With mod I Assistance Outcome: Progressing Goal: RH STG MANAGE BLADDER WITH MEDICATION WITH ASSISTANCE Description: STG Manage Bladder With Medication With mod I Assistance. Outcome: Progressing   Problem: RH KNOWLEDGE DEFICIT Goal: RH STG INCREASE KNOWLEDGE OF DIABETES Description: Patient and spouse will be able to manage DM using educational resources for medications and dietary modification independently Outcome: Progressing Goal: RH STG INCREASE KNOWLEDGE OF HYPERTENSION Description: Patient and spouse will be able to manage HTN using educational resources for medications and dietary modification independently Outcome: Progressing Goal: RH STG INCREASE KNOWLEDGE OF DYSPHAGIA/FLUID INTAKE Description: Patient and spouse will be able to manage Dysphagia using educational resources for medications and dietary modification independently Outcome: Progressing Goal: RH STG INCREASE KNOWLEGDE OF HYPERLIPIDEMIA Description: Patient and spouse will be able to manage HLD using educational resources for medications and dietary modification independently Outcome: Progressing Goal: RH STG INCREASE KNOWLEDGE OF STROKE PROPHYLAXIS Description: Patient and spouse  will be able to manage secondary risks using educational resources for medications and dietary modification independently Outcome: Progressing   "

## 2024-04-30 DIAGNOSIS — I10 Essential (primary) hypertension: Secondary | ICD-10-CM

## 2024-04-30 DIAGNOSIS — K5900 Constipation, unspecified: Secondary | ICD-10-CM

## 2024-04-30 LAB — GLUCOSE, CAPILLARY
Glucose-Capillary: 116 mg/dL — ABNORMAL HIGH (ref 70–99)
Glucose-Capillary: 125 mg/dL — ABNORMAL HIGH (ref 70–99)
Glucose-Capillary: 160 mg/dL — ABNORMAL HIGH (ref 70–99)
Glucose-Capillary: 164 mg/dL — ABNORMAL HIGH (ref 70–99)

## 2024-04-30 MED ORDER — ACETAMINOPHEN 325 MG PO TABS: 650.0000 mg | ORAL_TABLET | ORAL | Status: AC | PRN

## 2024-04-30 MED ORDER — DOCUSATE SODIUM 100 MG PO CAPS: 100.0000 mg | ORAL_CAPSULE | Freq: Every day | ORAL | Status: DC

## 2024-04-30 MED ORDER — POLYETHYLENE GLYCOL 3350 17 G PO PACK: 17.0000 g | PACK | Freq: Every day | ORAL | Status: AC | PRN

## 2024-04-30 NOTE — Progress Notes (Signed)
 Physical Therapy Session Note  Patient Details  Name: Christopher Roberts MRN: [id number] Date of Birth: Oct 15, 1945  Today's Date: 04/30/2024 PT Missed Time: 60 Minutes Missed Time Reason: Patient fatigue;Patient unwilling to participate  Short Term Goals: Week 1:  PT Short Term Goal 1 (Week 1): Pt will perform bed mobility with Mod I and improved initiation/ effort. PT Short Term Goal 2 (Week 1): Pt will perform sit<>stand transfers with overall CGA to no AD. PT Short Term Goal 3 (Week 1): Pt will perform stand pivot transfers with overall CGA using LRAD. PT Short Term Goal 4 (Week 1): Pt will ambulate at least 40 ft using LRAD with CGA. PT Short Term Goal 5 (Week 1): Pt will complete stair navigation training.  Skilled Therapeutic Interventions/Progress Updates:   Pt missed of skilled therapy due to fatigue from busy therapy day and feeling cold with need to stay warm. Will re-attempt as schedule and pt availability permits.   Therapy Documentation Precautions:  Precautions Precautions: Fall Recall of Precautions/Restrictions: Impaired Precaution/Restrictions Comments: RLE weakness, RUE weakness Restrictions Weight Bearing Restrictions Per Provider Order: No General: PT Amount of Missed Time (min): 60 Minutes PT Missed Treatment Reason: Patient fatigue;Patient unwilling to participate  Pain:  No pain complaint this session.    Therapy/Group: Individual Therapy  Mliss DELENA Milliner PT, DPT, CSRS 04/30/2024, 2:37 PM

## 2024-04-30 NOTE — Progress Notes (Signed)
 "                                                        PROGRESS NOTE   Subjective/Complaints: No new complaints this AM.  Reports urinating ok. No other concerns.   LBM 12/22  ROS- denies fevers, CP, SOB, abd pain, N/V/D/C  Objective:   No results found. Recent Labs    04/28/24 0528  WBC 2.4*  HGB 8.2*  HCT 24.2*  PLT 54*   Recent Labs    04/28/24 0528  NA 136  K 4.1  CL 106  CO2 21*  GLUCOSE 121*  BUN 23  CREATININE 1.01  CALCIUM  8.3*    Intake/Output Summary (Last 24 hours) at 04/30/2024 1029 Last data filed at 04/30/2024 0700 Gross per 24 hour  Intake 236 ml  Output 856 ml  Net -620 ml        Physical Exam: Vital Signs Blood pressure 97/62, pulse 69, temperature 98.7 F (37.1 C), resp. rate 17, height 5' 10 (1.778 m), weight 76.6 kg, SpO2 99%.  General: No acute distress, sitting in WC appears comfortable Mood and affect are appropriate Heart: Regular rate and rhythm no rubs murmurs or extra sounds Lungs: Clear to auscultation, breathing unlabored, no rales or wheezes Abdomen: Positive bowel sounds, soft nontender to palpation, nondistended Extremities: No clubbing, cyanosis, or edema Skin: No evidence of breakdown, no evidence of rash to exposed surfaces, scattered bruises, L cartoid surgical incision c/d/i  PRIOR EXAMS: Neurologic: Cranial nerves II through XII intact, motor strength is 5/5 in left and 5/5 right deltoid, bicep, tricep, grip, 4/5 hip flexor, knee extensors, ankle dorsiflexor and plantar flexor Sensory exam normal sensation to light touch  in bilateral upper and left lower lower extremities Reduced LT RLE Oriented to person and place not time Cerebellar exam mild dysmetria Right finger nose finger Musculoskeletal: Full range of motion in all 4 extremities. No joint swelling    Assessment/Plan: 1. Functional deficits which require 3+ hours per day of interdisciplinary therapy in a comprehensive inpatient rehab  setting. Physiatrist is providing close team supervision and 24 hour management of active medical problems listed below. Physiatrist and rehab team continue to assess barriers to discharge/monitor patient progress toward functional and medical goals  Care Tool:  Bathing    Body parts bathed by patient: Right arm, Left upper leg, Left arm, Right lower leg, Chest, Left lower leg, Abdomen, Face, Front perineal area, Buttocks, Right upper leg         Bathing assist Assist Level: Supervision/Verbal cueing     Upper Body Dressing/Undressing Upper body dressing   What is the patient wearing?: Pull over shirt    Upper body assist Assist Level: Supervision/Verbal cueing    Lower Body Dressing/Undressing Lower body dressing      What is the patient wearing?: Underwear/pull up, Pants     Lower body assist Assist for lower body dressing: Contact Guard/Touching assist     Toileting Toileting    Toileting assist Assist for toileting: Minimal Assistance - Patient > 75%     Transfers Chair/bed transfer  Transfers assist     Chair/bed transfer assist level: Minimal Assistance - Patient > 75%     Locomotion Ambulation   Ambulation assist      Assist level: Minimal Assistance - Patient >  75% Assistive device: Walker-rolling Max distance: 10 ft   Walk 10 feet activity   Assist     Assist level: Minimal Assistance - Patient > 75% Assistive device: Walker-rolling   Walk 50 feet activity   Assist Walk 50 feet with 2 turns activity did not occur: Safety/medical concerns         Walk 150 feet activity   Assist Walk 150 feet activity did not occur: Safety/medical concerns         Walk 10 feet on uneven surface  activity   Assist Walk 10 feet on uneven surfaces activity did not occur: Safety/medical concerns         Wheelchair     Assist Is the patient using a wheelchair?: Yes Type of Wheelchair: Manual    Wheelchair assist level:  Dependent - Patient 0% Max wheelchair distance: 15 ft    Wheelchair 50 feet with 2 turns activity    Assist        Assist Level: Dependent - Patient 0%   Wheelchair 150 feet activity     Assist      Assist Level: Dependent - Patient 0%   Blood pressure 97/62, pulse 69, temperature 98.7 F (37.1 C), resp. rate 17, height 5' 10 (1.778 m), weight 76.6 kg, SpO2 99%.  Medical Problem List and Plan: 1. 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 Formulized individual treatment plan based on medical history, underlying problem and comorbidities.     2.  Antithrombotics: -DVT/anticoagulation:  Mechanical: Antiembolism stockings, thigh (TED hose) Bilateral lower extremities             -antiplatelet therapy: Aspirin  81 mg daily and Plavix  75 mg daily 3. Pain Management: Tylenol  as needed 4. Mood/Behavior/Sleep: Prozac  20 mg daily.             -antipsychotic agents: N/A  -melatonin prn 5. Neuropsych/cognition: This patient is capable of making decisions on his own behalf. 6. Skin/Wound Care: Routine skin checks 7. Fluids/Electrolytes/Nutrition: Routine and analysis with follow-up chemistries  8.  Symptomatic left carotid artery stenosis.  Status post stenting/angioplasty 04/21/2024 per Dr. Gretta  9.  Hypotension: continue ProAmatine  10 mg 3 times daily.  Monitor with increased mobility  -12/24 Bps stable, continue to monitor  Vitals:   04/26/24 1342 04/26/24 2015 04/27/24 0424 04/27/24 1347  BP: 108/60 121/67 102/61 120/69   04/27/24 2001 04/28/24 0546 04/28/24 0908 04/28/24 2006  BP: 102/83  107/66 107/60 133/68   04/29/24 0600 04/29/24 1620 04/29/24 2042 04/30/24 0556  BP: 112/65 128/69 118/76 97/62    10.  Diabetes mellitus.  Hemoglobin A1c 5.6.  Currently on SSI.  Prior to admission patient on Glucophage  500 mg daily.  Resume as needed CBGs ok if they start climbing above 180 will restart metformin   -12/24 CBGs stable, continue to monitor for now CBG (last 3)  Recent Labs    04/29/24 1622 04/29/24 2040 04/30/24 0553  GLUCAP 116* 190* 116*    11.  Hyperlipidemia: continue Lipitor  80mg  daily  12.  History of CAD/PCI/CABG/SVT.  Follow-up [name] outpatient  13.  Pancytopenia.  Heme oncology Dr.Pasam outpatient.  Follow-up CBC -No heparin  for DVT prophyllaxis, monitor for bleeding on DAPT transfuse if PLT<20K or Hgb <7.0 -12/24 recheck tomorrow    Latest Ref Rng & Units 04/28/2024    5:28 AM 04/25/2024    6:00 AM 04/24/2024    4:03 AM  CBC  WBC 4.0 - 10.5 K/uL 2.4  2.1  2.1   Hemoglobin 13.0 - 17.0 g/dL 8.2  8.7  8.5   Hematocrit 39.0 - 52.0 % 24.2  25.2  24.9   Platelets 150 - 400 K/uL 54  59  57     14.  Cirrhosis of liver.  Imaging suggestive of cirrhosis.  Bili mildly elevated as well as INR 1.3.  Follow-up outpatient  15.  Urinary retention.  In-N-Out catheterization 12/13 x 1.  Placed on Flomax . Messaged nursing to discuss her PVRs. -04/27/24 flomax  d/c'd yesterday, no PVRs documented, will discuss with nursing-- ordered PVRs -12/24 PVRs stable, consider DC  16.  Constipation: continue Colace 100 mg daily  -LBM 12/22, monitor    LOS: 6 days A FACE TO FACE EVALUATION WAS PERFORMED  Murray Collier 04/30/2024, 10:29 AM     "

## 2024-04-30 NOTE — Patient Care Conference (Signed)
 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 Murray Collier Social Worker Present: Rhoda Clement, LCSW Nurse Present: Barnie Ronde, RN PT Present: Recardo Milliner, PT OT Present: Katheryn Mines, OT SLP Present: Blaise Alderman, SLP     Current Status/Progress Goal Weekly Team Focus  Bowel/Bladder   (P) pt continent of b/b   (P) Remain continent   (P) Assist with toileting qshift and prn    Swallow/Nutrition/ Hydration               ADL's   UB ADLs set-up, LB ADLs CGA-MIN A, toileting CGA-MIN A, ambulatory transfers CGA using RW // barriers: fatigue, decreased proprioception in R LE   MOD I UB ADL, SUP LB ADL and transfers   activity tolerance, ADL retraining, dynamic standing balance, DME education, Pt education    Mobility   Bed mobility = CGA/ supervision; transfers = CGA improving from Min/ ModA on Sat; ambulation = 50-90 ft on average and up to 135 ft yesterday using RW with CGA   supervision overall  Barriers: hemibody weakness and reduced proprioception, mild slowness to process /// Work on: activity tolerance, strength, standing balance, family education    Communication   minA word finding in complex  tasks   supervision   circumlocution strategies, conversational scenarios    Safety/Cognition/ Behavioral Observations  minA   supervisionA   problem solving, memory, attention    Pain   (P) No c/o pain   (P) Remain pain free   (P) Assess qshift and prn    Skin   (P) L wrist bruising             Discharge Planning:  Home with wife and great grandchildren whom they raise-will await team's recommendations and work on plan   Team Discussion: Patient admitted post ICA CVA with history of pancytopenia.  Functional progress limited by poor proprioception of right LE and slow processing with fatigue and dizziness.  Patient on target to meet rehab goals: yes, currently needs set up for upper body care and CGA - min assist for lower body care and toileting. Transfers using a RW with CGA and ambulates up to 27' using a RW with CGA.  Needs min assist for cognition. Goals for discharge set for supervision - mod I overall.  *See Care Plan and progress notes for long and short-term goals.   Revisions to Treatment Plan:  N/a   Teaching Needs: Safety, medications, transfers, toileting, etc.   Current Barriers to Discharge: Decreased caregiver support  Possible Resolutions to Barriers: Family education HH follow up services DME: [facility]     Medical Summary Current Status: CVA, hypotension, DM2, pancytopenia, urinary retention, constipation  Barriers to Discharge: Self-care education;Medical stability;Hypotension  Barriers to Discharge Comments: CVA,  hypotension, DM2, pancytopenia, urinary retention, constipation Possible Resolutions to [employer] Focus: recheck labs, monitor PVR, monitor bowel function, monitor CBGs, monitor BP   Continued Need for Acute Rehabilitation Level of Care: The patient requires daily medical management by a physician with specialized training in physical medicine and rehabilitation for the following reasons: Direction of a multidisciplinary physical  rehabilitation program to maximize functional independence : Yes Medical management of patient stability for increased activity during participation in an intensive rehabilitation regime.: Yes Analysis of laboratory values and/or radiology reports with any subsequent need for medication adjustment and/or medical intervention. : Yes   I attest that I was present, lead the team conference, and concur with the assessment and plan of the team.   Fredericka Sober B 04/30/2024, 1:00 PM

## 2024-04-30 NOTE — Progress Notes (Signed)
 Physical Therapy Session Note  Patient Details  Name: Christopher Roberts MRN: [id number] Date of Birth: 12-Nov-1945  Today's Date: 04/30/2024 PT Individual Time: 9040-8954 PT Individual Time Calculation (min): 46 min   Short Term Goals: Week 1:  PT Short Term Goal 1 (Week 1): Pt will perform bed mobility with Mod I and improved initiation/ effort. PT Short Term Goal 2 (Week 1): Pt will perform sit<>stand transfers with overall CGA to no AD. PT Short Term Goal 3 (Week 1): Pt will perform stand pivot transfers with overall CGA using LRAD. PT Short Term Goal 4 (Week 1): Pt will ambulate at least 40 ft using LRAD with CGA. PT Short Term Goal 5 (Week 1): Pt will complete stair navigation training.  Skilled Therapeutic Interventions/Progress Updates:      Pt seated in WC upon arrival. Pt agreeable to therapy. Pt denies any pain.   Pt stood with RW and CGA, verbal cues provided for initiation.    Pt performed standing marching x10 B with use of RW and CGA, verbal cues provided for increased B LE clearance. Pt required seated rest break for fatigue.   Pt participated in sing along activity during 1 on 1 session with PT Session focused on dynamic standing balance and  ambulation while maintaining sustained attention in crowded environment. PT attempted to challenge pt with divided attention task - singing and standing marching simultaneously as well as seated therex while singing. . Pt demonstrates and endorses inability to complete both at the same time. Pt very appreciative of the socialization and peer support.   Pt ambulated x~100 feet with RW and CGA, verbal cues provided for upright posture.   Pt seated in [facility] with all needs within reach and chair alarm on.    Therapy Documentation Precautions:  Precautions Precautions: Fall Recall of Precautions/Restrictions: Impaired Precaution/Restrictions Comments: RLE weakness, RUE weakness Restrictions Weight Bearing Restrictions Per Provider  Order: No  Therapy/Group: Individual Therapy  [facility] Doreene Orris, [place], DPT  04/30/2024, 9:58 AM

## 2024-04-30 NOTE — Progress Notes (Incomplete)
 Physical Therapy Session Note  Patient Details  Name: Christopher Roberts MRN: [id number] Date of Birth: [birth year]  {CHL IP REHAB PT TIME CALCULATION:[id number]}  Short Term Goals: Week 1:  PT Short Term Goal 1 (Week 1): Pt will perform bed mobility with Mod I and improved initiation/ effort. PT Short Term Goal 2 (Week 1): Pt will perform sit<>stand transfers with overall CGA to no AD. PT Short Term Goal 3 (Week 1): Pt will perform stand pivot transfers with overall CGA using LRAD. PT Short Term Goal 4 (Week 1): Pt will ambulate at least 40 ft using LRAD with CGA. PT Short Term Goal 5 (Week 1): Pt will complete stair navigation training.  Skilled Therapeutic Interventions/Progress Updates:      Therapy Documentation Precautions:  Precautions Precautions: Fall Recall of Precautions/Restrictions: Impaired Precaution/Restrictions Comments: RLE weakness, RUE weakness Restrictions Weight Bearing Restrictions Per Provider Order: No   Therapy/Group: Individual Therapy  [facility] Doreene Orris, [place], DPT  04/30/2024, 7:51 AM

## 2024-04-30 NOTE — Progress Notes (Signed)
 Speech Language Pathology Daily Session Note  Patient Details  Name: [name] MRN: [id number] Date of Birth: 05-05-1946  Today's Date: 04/30/2024 SLP Individual Time: 0800-0843 SLP Individual Time Calculation (min): 43 min  Short Term Goals: Week 1: SLP Short Term Goal 1 (Week 1): STGs = LTGs d/t ELOS  Skilled Therapeutic Interventions: Skilled therapy session focused on cognitive goals. SLP facilitated session by educating patient on WRAP memory strategies (write, repeat, associate, picture). Patient provided examples of each strategy with supervisionA. SLP then targeted STM through paragraph retention task. Patient utilized strategies to recall important information after a 10 minute delay with 85% accuracy. Patient left in [facility] with alarm set and call bell in reach. Continue POC.  Pain Denies   Therapy/Group: Individual Therapy  Reymond Maynez M.A., CCC-SLP 04/30/2024, 7:35 AM

## 2024-04-30 NOTE — Progress Notes (Signed)
 Physical Therapy Session Note  Patient Details  Name: Christopher Roberts MRN: [id number] Date of Birth: [birth year]  Today's Date: 04/29/2024 PT Individual Time: 1031-1130 PT Individual Time Calculation (min): 59 min   Short Term Goals: Week 1:  PT Short Term Goal 1 (Week 1): Pt will perform bed mobility with Mod I and improved initiation/ effort. PT Short Term Goal 2 (Week 1): Pt will perform sit<>stand transfers with overall CGA to no AD. PT Short Term Goal 3 (Week 1): Pt will perform stand pivot transfers with overall CGA using LRAD. PT Short Term Goal 4 (Week 1): Pt will ambulate at least 40 ft using LRAD with CGA. PT Short Term Goal 5 (Week 1): Pt will complete stair navigation training.  Skilled Therapeutic Interventions/Progress Updates:  Patient seated upright in w/c on entrance to room. Patient alert and agreeable to PT session.   Patient with no pain complaint at start of session.  Therapeutic Activity: Transfers: Pt performed sit<>stand and stand pivot transfers throughout session with CGA initially and improving to supervision by end of session with rare intermittent instance of CGA especially with reaching back for w/c to control descent to sit. VC provided.    Neuromuscular Re-ed: NMR facilitated during session with focus on standing balance. Pt guided in beanbag toss to target board to challenge dynamic balance. Required vc for coordniation of dynamic step and full underarm swing with LUE. With practice, improves in performance, balance, and target.   During seated rest, pt able to solve 10 word puzzles in order to determine objects in day room to locate. Requires some time to reason and problem solve with slight delay in processing but able to complete all with extra clue for one object.   NMR performed for improvements in motor control and coordination, balance, sequencing, judgement, and self confidence/ efficacy in performing all aspects of mobility at highest level of  independence.    Gait Training:  Pt ambulates using RW and CGA to help guide pt toward 7/10 pictures at eye level about day room and to provide steadying during turns. Pt is able to find 4/7  on own and requires vc to find other 3. Last 3 objects found by pt with minimal cueing while seated in w/c. Pt is able to take slow turns with vc for turning walker around him rather than attempting to drive the walker in front of him. Improved upright posture and steadiness especially in turns but still requires CGA for safety.   Ended gait training d/t increasing low back pain with time in stance. Cues to reach back for w/c armrest and can control descent to sit with SBA.   Patient seated upright in w/c at end of session with brakes locked, belt alarm set, and all needs within reach.   Therapy Documentation Precautions:  Precautions Precautions: Fall Recall of Precautions/Restrictions: Impaired Precaution/Restrictions Comments: RLE weakness, RUE weakness Restrictions Weight Bearing Restrictions Per Provider Order: No  Pain:  Back pain related with time in upright stance/ ambulation and addressed with seated rest.    Therapy/Group: Individual Therapy  Mliss DELENA Milliner PT, DPT, CSRS 04/29/2024, 12:13 AM

## 2024-04-30 NOTE — Progress Notes (Signed)
 Patient ID: Christopher Roberts, male   DOB: [birth year], 78 y.o.   MRN: [id number] Met with pt to give team conference with goals of supervision and target discharge date of 1/1. Will call wife to see if can come in for education prior to going home. Discussed equipment needs and home health follow up. He is in agreement with both, he has a rolling walker at home. Awaiting wife's return call

## 2024-04-30 NOTE — Progress Notes (Signed)
 Occupational Therapy Session Note  Patient Details  Name: Christopher Roberts MRN: [id number] Date of Birth: 1945-12-20  Today's Date: 04/30/2024 OT Individual Time: [phone] OT Individual Time Calculation (min): 62 min    Short Term Goals: Week 1:  OT Short Term Goal 1 (Week 1): patient will complete toileting / toileting transfer CGA with increased attention to the R with min verbal cueing OT Short Term Goal 2 (Week 1): patient will complete grooming standing at sink with SUP to increase dynamic balance OT Short Term Goal 3 (Week 1): patient will complete LB dressing CGA  Skilled Therapeutic Interventions/Progress Updates:     Pt received sitting up in wc, dressed and ready for the day politely declining need for ADLs this session. Pt presenting to be in good spirits receptive to skilled OT session reporting 0/10 pain- OT offering intermittent rest breaks, repositioning, and therapeutic support to optimize participation in therapy session. Focused this session on endurance training and dynamic balance to increase overall safety and independence in ADLs. Some dizziness reported with prolonged standing, however improved with seated rest breaks.   Discussed home set-up and DME needs with OT recommending BSC and shower seat. Pt reporting he owns shower seat and is receptive to using BSC at d/c.   Transported Pt to therapy gym in wc for time management and energy conservation.   Engaged Pt in completing dynamic standing balance bean bag toss activity to work on dynamic standing balance and reactionary balance with blocked practice of sit<>stands and posterior reaching incorporated into task to simulate skills required for BADLs. Pt instructed to complete sit<>stand between each throw and maintain standing balance while reaching posteriorly to retrieve bean bag from mat table and throw it at target. Pt able to complete 2x10 reps with CGA, increased time for seated rest breaks required between each  throw 2/2 dizziness and decreased endurance.    Pt participated in seated beach ball volley activity using 2# weighted dowel sitting EOM to work on VMC, B UE reciprocal arm movements, activity tolerance, UE strength, and trunk control. Pt able to tolerate completing 1.5 minutes x2 trials with short rest breaks provided between trials and verbal cues for techniques.    Engaged Pt in completing B U/LE endurance training on NuStep to improve endurance and coordination for ADL and transfers. Pt able to complete total of 6 min on level 3 setting with rest break provided halfway and following.   Ambulatory transfers completed using RW with CGA during session with min verbal cues for hand placement during sit<>stands. Pt was left resting in wc in therapy gym under supervision of therapy staff with all needs met.    Therapy Documentation Precautions:  Precautions Precautions: Fall Recall of Precautions/Restrictions: Impaired Precaution/Restrictions Comments: RLE weakness, RUE weakness Restrictions Weight Bearing Restrictions Per Provider Order: No   Therapy/Group: Individual Therapy  Christopher Roberts 04/30/2024, 8:34 AM

## 2024-05-01 DIAGNOSIS — E119 Type 2 diabetes mellitus without complications: Secondary | ICD-10-CM

## 2024-05-01 DIAGNOSIS — Z794 Long term (current) use of insulin: Secondary | ICD-10-CM

## 2024-05-01 LAB — BASIC METABOLIC PANEL WITH GFR
Anion gap: 9 (ref 5–15)
BUN: 24 mg/dL — ABNORMAL HIGH (ref 8–23)
CO2: 20 mmol/L — ABNORMAL LOW (ref 22–32)
Calcium: 8.5 mg/dL — ABNORMAL LOW (ref 8.9–10.3)
Chloride: 106 mmol/L (ref 98–111)
Creatinine, Ser: 1.14 mg/dL (ref 0.61–1.24)
GFR, Estimated: >60 mL/min
Glucose, Bld: 107 mg/dL — ABNORMAL HIGH (ref 70–99)
Potassium: 4.3 mmol/L (ref 3.5–5.1)
Sodium: 135 mmol/L (ref 135–145)

## 2024-05-01 LAB — GLUCOSE, CAPILLARY
Glucose-Capillary: 111 mg/dL — ABNORMAL HIGH (ref 70–99)
Glucose-Capillary: 166 mg/dL — ABNORMAL HIGH (ref 70–99)
Glucose-Capillary: 150 mg/dL — ABNORMAL HIGH (ref 70–99)
Glucose-Capillary: 179 mg/dL — ABNORMAL HIGH (ref 70–99)

## 2024-05-01 LAB — CBC
HCT: 23.7 % — ABNORMAL LOW (ref 39.0–52.0)
Hemoglobin: 8.2 g/dL — ABNORMAL LOW (ref 13.0–17.0)
MCH: 31.1 pg (ref 26.0–34.0)
MCHC: 34.6 g/dL (ref 30.0–36.0)
MCV: 89.8 fL (ref 80.0–100.0)
Platelets: 51 K/uL — ABNORMAL LOW (ref 150–400)
RBC: 2.64 MIL/uL — ABNORMAL LOW (ref 4.22–5.81)
RDW: 18.8 % — ABNORMAL HIGH (ref 11.5–15.5)
WBC: 2.4 K/uL — ABNORMAL LOW (ref 4.0–10.5)
nRBC: 1.3 % — ABNORMAL HIGH (ref 0.0–0.2)

## 2024-05-01 NOTE — Progress Notes (Signed)
 "                                                        PROGRESS NOTE   Subjective/Complaints: Sleep was a little hit or miss, but he doesn't want to do anything different. Overall feels ok  ROS: Patient denies fever, rash, sore throat, blurred vision, dizziness, nausea, vomiting, diarrhea, cough, shortness of breath or chest pain, joint or back/neck pain, headache, or mood change.   Objective:   No results found. Recent Labs    05/01/24 0552  WBC 2.4*  HGB 8.2*  HCT 23.7*  PLT 51*   Recent Labs    05/01/24 0552  NA 135  K 4.3  CL 106  CO2 20*  GLUCOSE 107*  BUN 24*  CREATININE 1.14  CALCIUM  8.5*    Intake/Output Summary (Last 24 hours) at 05/01/2024 0741 Last data filed at 05/01/2024 0453 Gross per 24 hour  Intake 118 ml  Output 795 ml  Net -677 ml        Physical Exam: Vital Signs Blood pressure 106/66, pulse 70, temperature 98.9 F (37.2 C), temperature source Oral, resp. rate 18, height 5' 10 (1.778 m), weight 76.6 kg, SpO2 100%.  Constitutional: No distress . Vital signs reviewed. HEENT: NCAT, EOMI, oral membranes moist Neck: supple Cardiovascular: RRR without murmur. No JVD    Respiratory/Chest: CTA Bilaterally without wheezes or rales. Normal effort    GI/Abdomen: BS +, non-tender,sl distended Ext: no clubbing, cyanosis, or edema Psych: pleasant and cooperative  Skin: No evidence of breakdown, no evidence of rash to exposed surfaces, scattered bruises, L cartoid surgical incision remains c/d/i Neurologic: Cranial nerves II through XII intact, motor strength is 5/5 in left and 5/5 right deltoid, bicep, tricep, grip, 4/5 hip flexor, knee extensors, ankle dorsiflexor and plantar flexor Sensory exam normal sensation to light touch  in bilateral upper and left lower lower extremities Reduced LT RLE Oriented to person and place, month, day Cerebellar exam mild dysmetria Right finger nose finger  Musculoskeletal: Full range of motion in all 4  extremities. No joint swelling    Assessment/Plan: 1. Functional deficits which require 3+ hours per day of interdisciplinary therapy in a comprehensive inpatient rehab setting. Physiatrist is providing close team supervision and 24 hour management of active medical problems listed below. Physiatrist and rehab team continue to assess barriers to discharge/monitor patient progress toward functional and medical goals  Care Tool:  Bathing    Body parts bathed by patient: Right arm, Left upper leg, Left arm, Right lower leg, Chest, Left lower leg, Abdomen, Face, Front perineal area, Buttocks, Right upper leg         Bathing assist Assist Level: Supervision/Verbal cueing     Upper Body Dressing/Undressing Upper body dressing   What is the patient wearing?: Pull over shirt    Upper body assist Assist Level: Supervision/Verbal cueing    Lower Body Dressing/Undressing Lower body dressing      What is the patient wearing?: Underwear/pull up, Pants     Lower body assist Assist for lower body dressing: Contact Guard/Touching assist     Toileting Toileting    Toileting assist Assist for toileting: Minimal Assistance - Patient > 75%     Transfers Chair/bed transfer  Transfers assist     Chair/bed transfer assist level: Minimal  Assistance - Patient > 75%     Locomotion Ambulation   Ambulation assist      Assist level: Minimal Assistance - Patient > 75% Assistive device: Walker-rolling Max distance: 10 ft   Walk 10 feet activity   Assist     Assist level: Minimal Assistance - Patient > 75% Assistive device: Walker-rolling   Walk 50 feet activity   Assist Walk 50 feet with 2 turns activity did not occur: Safety/medical concerns         Walk 150 feet activity   Assist Walk 150 feet activity did not occur: Safety/medical concerns         Walk 10 feet on uneven surface  activity   Assist Walk 10 feet on uneven surfaces activity did not occur:  Safety/medical concerns         Wheelchair     Assist Is the patient using a wheelchair?: Yes Type of Wheelchair: Manual    Wheelchair assist level: Dependent - Patient 0% Max wheelchair distance: 15 ft    Wheelchair 50 feet with 2 turns activity    Assist        Assist Level: Dependent - Patient 0%   Wheelchair 150 feet activity     Assist      Assist Level: Dependent - Patient 0%   Blood pressure 106/66, pulse 70, temperature 98.9 F (37.2 C), temperature source Oral, resp. rate 18, height 5' 10 (1.778 m), weight 76.6 kg, SpO2 100%.  Medical Problem List and Plan: 1. 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  Antithrombotics: -DVT/anticoagulation:  Mechanical: Antiembolism stockings, thigh (TED hose) Bilateral lower extremities             -antiplatelet therapy: Aspirin  81 mg daily and Plavix  75 mg daily 3. Pain Management: Tylenol  as needed 4. Mood/Behavior/Sleep: Prozac  20 mg daily.             -antipsychotic agents: N/A  -melatonin prn 5. Neuropsych/cognition: This patient is capable of making decisions on his own behalf. 6. Skin/Wound Care: Routine skin checks 7. Fluids/Electrolytes/Nutrition: Routine and analysis with follow-up chemistries  8.  Symptomatic left carotid artery stenosis.  Status post stenting/angioplasty 04/21/2024 per Dr. Gretta  9.  Hypotension: continue ProAmatine  10 mg 3 times daily.  Monitor with increased mobility  -12/25 Bps remain stable, continue to monitor  Vitals:   04/27/24 1347 04/27/24 2001 04/28/24 0546 04/28/24 0908  BP: 120/69 102/83 107/66 107/60   04/28/24 2006 04/29/24 0600 04/29/24 1620 04/29/24 2042  BP: 133/68  112/65 128/69 118/76   04/30/24 0556 04/30/24 1305 04/30/24 2044 05/01/24 0453  BP: 97/62 (!) 113/59 121/64 106/66    10.  Diabetes mellitus.  Hemoglobin A1c 5.6.  Currently on SSI.  Prior to admission patient on Glucophage  500 mg daily.  Resume as needed CBGs ok if they start climbing above 180 will restart metformin   -12/25 CBGs under reasonable control. No changes CBG (last 3)  Recent Labs    04/30/24 1636 04/30/24 2041 05/01/24 0603  GLUCAP 160* 164*  111*    11.  Hyperlipidemia: continue Lipitor  80mg  daily  12.  History of CAD/PCI/CABG/SVT.  Follow-up [name] outpatient  13.  Pancytopenia.  Heme oncology Dr.Pasam outpatient.  Follow-up CBC -No heparin  for DVT prophyllaxis, monitor for bleeding on DAPT transfuse if PLT<20K or Hgb <7.0 -12/25 labs holding today. No action required  -continue to monitor serially    Latest Ref Rng & Units 05/01/2024    5:52 AM 04/28/2024    5:28 AM 04/25/2024    6:00 AM  CBC  WBC 4.0 - 10.5 K/uL 2.4  2.4  2.1   Hemoglobin 13.0 - 17.0 g/dL 8.2  8.2  8.7   Hematocrit 39.0 - 52.0 % 23.7  24.2  25.2   Platelets 150 - 400 K/uL 51  54  59     14.  Cirrhosis of liver.  Imaging suggestive of cirrhosis.  Bili mildly elevated as well as INR 1.3.  Follow-up outpatient  15.  Urinary retention.  In-N-Out catheterization 12/13 x 1.  Placed on Flomax . Messaged nursing to discuss her PVRs. -04/27/24 flomax  d/c'd yesterday, no PVRs documented, will discuss with nursing-- ordered PVRs -12/25 PVRs variable. Had to be cathed once yesterday for pvr of 350.   -continue PVR's for now  16.  Constipation: continue Colace 100 mg daily  -LBM 12/22, monitor    LOS: 7 days A FACE TO FACE EVALUATION WAS PERFORMED  Arthea ONEIDA Gunther 05/01/2024, 7:41 AM     "

## 2024-05-02 ENCOUNTER — Ambulatory Visit (HOSPITAL_COMMUNITY)

## 2024-05-02 LAB — GLUCOSE, CAPILLARY
Glucose-Capillary: 119 mg/dL — ABNORMAL HIGH (ref 70–99)
Glucose-Capillary: 120 mg/dL — ABNORMAL HIGH (ref 70–99)
Glucose-Capillary: 191 mg/dL — ABNORMAL HIGH (ref 70–99)
Glucose-Capillary: 112 mg/dL — ABNORMAL HIGH (ref 70–99)

## 2024-05-02 NOTE — Progress Notes (Signed)
 Occupational Therapy Weekly Progress Note  Patient Details  Name: Christopher Roberts MRN: [id number] Date of Birth: 1945/10/01  Beginning of progress report period: April 25, 2024 End of progress report period: May 02, 2024  Today's Date: 05/02/2024 OT Individual Time: 8951-8867 OT Individual Time Calculation (min): 44 min    Patient has met 3 of 3 short term goals.  Patient demonstrated improvement in ADLS, functional mobility, use of body/ body awareness. Patient is limited by functional activity tolerance, however is making functional improvements. Patient is consistently performing at a CGA level for standing ADLs, SUP for seated ADLs. Patient has not yet completed caregiver training  Patient continues to demonstrate the following deficits: muscle weakness, decreased coordination, and decreased standing balance and therefore will continue to benefit from skilled OT intervention to enhance overall performance with BADL and Reduce care partner burden.  Patient progressing toward long term goals..  Continue plan of care.  OT Short Term Goals Week 2:  OT Short Term Goal 1 (Week 2): STG =LTG d.t ELOS  Skilled Therapeutic Interventions/Progress Updates:  Patient agreeable to participate in OT session. Reports 0/10 pain level.   Patient participated in skilled OT session focusing on dynamic balance, toileting. Patient received in chair. Patient requested to complete  toileting with urgency. Completed functional mobility to bathroom. Patient had episode of bowel and bladder both continent. Patient able to complete toileitng 3/3 with CGA to min A, with min A for increased thoroughness. Patient required increased time for toileting due to increased fatigue, and increased bowel. Patient then completed hand hygiene standing/ sitting at sink following functional mobility to sink CGA. Patient had increased fatigue and labored breathing during session. OT checked patients saturation, at 100 % on  Room air. Patient completed standing dynamic balance CGA for standing marches with increased RLE coordination with breathing rate increasing again. Patient reports this is abnormal for him. Nursing notified. Patient returned to chair, alarm on all needs in reach     Therapy Documentation Precautions:  Precautions Precautions: Fall Recall of Precautions/Restrictions: Impaired Precaution/Restrictions Comments: RLE weakness, RUE weakness Restrictions Weight Bearing Restrictions Per Provider Order: No  Therapy/Group: Individual Therapy  Christopher Roberts 05/02/2024, 10:51 AM

## 2024-05-02 NOTE — Progress Notes (Addendum)
 "                                                        PROGRESS NOTE   Subjective/Complaints:  Labs reviewed , no pain c/os RLE getting stronger, discussed d/c date   ROS: Patient denies fever, rash, sore throat, blurred vision, dizziness, nausea, vomiting, diarrhea, cough, shortness of breath or chest pain, joint or back/neck pain, headache, or mood change.   Objective:   No results found. Recent Labs    05/01/24 0552  WBC 2.4*  HGB 8.2*  HCT 23.7*  PLT 51*   Recent Labs    05/01/24 0552  NA 135  K 4.3  CL 106  CO2 20*  GLUCOSE 107*  BUN 24*  CREATININE 1.14  CALCIUM  8.5*    Intake/Output Summary (Last 24 hours) at 05/02/2024 9092 Last data filed at 05/02/2024 0804 Gross per 24 hour  Intake 474 ml  Output 710 ml  Net -236 ml        Physical Exam: Vital Signs Blood pressure (!) 100/52, pulse 74, temperature 98.2 F (36.8 C), resp. rate 18, height 5' 10 (1.778 m), weight 76.6 kg, SpO2 100%.   General: No acute distress Mood and affect are appropriate Heart: Regular rate and rhythm no rubs murmurs or extra sounds Lungs: Clear to auscultation, breathing unlabored, no rales or wheezes Abdomen: Positive bowel sounds, soft nontender to palpation, nondistended Extremities: No clubbing, cyanosis, or edema   Skin: No evidence of breakdown, no evidence of rash to exposed surfaces, scattered bruises, L cartoid surgical incision remains c/d/i Neurologic: Cranial nerves II through XII intact, motor strength is 5/5 in left UE/LE and 5/5 right deltoid, bicep, tricep, grip, 4/5 hip flexor, knee extensors, ankle dorsiflexor and plantar flexor Sensory exam normal sensation to light touch  in bilateral upper and left lower lower extremities Reduced LT RLE Oriented to person and place, month, day Cerebellar exam mild dysmetria Right finger nose finger  Musculoskeletal: Full range of motion in all 4 extremities. No joint swelling    Assessment/Plan: 1. Functional  deficits which require 3+ hours per day of interdisciplinary therapy in a comprehensive inpatient rehab setting. Physiatrist is providing close team supervision and 24 hour management of active medical problems listed below. Physiatrist and rehab team continue to assess barriers to discharge/monitor patient progress toward functional and medical goals  Care Tool:  Bathing    Body parts bathed by patient: Right arm, Left upper leg, Left arm, Right lower leg, Chest, Left lower leg, Abdomen, Face, Front perineal area, Buttocks, Right upper leg         Bathing assist Assist Level: Supervision/Verbal cueing     Upper Body Dressing/Undressing Upper body dressing   What is the patient wearing?: Pull over shirt    Upper body assist Assist Level: Supervision/Verbal cueing    Lower Body Dressing/Undressing Lower body dressing      What is the patient wearing?: Underwear/pull up, Pants     Lower body assist Assist for lower body dressing: Contact Guard/Touching assist     Toileting Toileting    Toileting assist Assist for toileting: Minimal Assistance - Patient > 75%     Transfers Chair/bed transfer  Transfers assist     Chair/bed transfer assist level: Contact Guard/Touching assist     Locomotion Ambulation  Ambulation assist      Assist level: Contact Guard/Touching assist Assistive device: Walker-rolling Max distance: 100 feet   Walk 10 feet activity   Assist     Assist level: Contact Guard/Touching assist Assistive device: Walker-rolling   Walk 50 feet activity   Assist Walk 50 feet with 2 turns activity did not occur: Safety/medical concerns  Assist level: Contact Guard/Touching assist Assistive device: Walker-rolling    Walk 150 feet activity   Assist Walk 150 feet activity did not occur: Safety/medical concerns (fatigue/SOB)         Walk 10 feet on uneven surface  activity   Assist Walk 10 feet on uneven surfaces activity did not  occur: Safety/medical concerns         Wheelchair     Assist Is the patient using a wheelchair?: Yes Type of Wheelchair: Manual    Wheelchair assist level: Dependent - Patient 0% Max wheelchair distance: 15 ft    Wheelchair 50 feet with 2 turns activity    Assist        Assist Level: Dependent - Patient 0%   Wheelchair 150 feet activity     Assist      Assist Level: Dependent - Patient 0%   Blood pressure (!) 100/52, pulse 74, temperature 98.2 F (36.8 C), resp. rate 18, height 5' 10 (1.778 m), weight 76.6 kg, SpO2 100%.  Medical Problem List and Plan: 1. 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  Antithrombotics: -DVT/anticoagulation:  Mechanical: Antiembolism stockings, thigh (TED hose) Bilateral lower extremities             -antiplatelet therapy: Aspirin  81 mg daily and Plavix  75 mg daily 3. Pain Management: Tylenol  as needed 4. Mood/Behavior/Sleep: Prozac  20 mg daily.             -antipsychotic agents: N/A  -melatonin prn 5. Neuropsych/cognition: This patient is capable of making decisions on his own behalf. 6. Skin/Wound Care: Routine skin checks 7. Fluids/Electrolytes/Nutrition: Routine and analysis with follow-up chemistries  8.  Symptomatic left carotid artery stenosis.  Status post stenting/angioplasty 04/21/2024 per Dr. Gretta  9.  Hypotension: continue ProAmatine  10 mg 3 times daily.  Monitor with increased mobility  -12/25 Bps remain stable, continue to monitor  Vitals:   04/28/24 0908 04/28/24 2006 04/29/24 0600 04/29/24 1620  BP: 107/60 133/68 112/65 128/69   04/29/24 2042 04/30/24 0556 04/30/24 1305 04/30/24 2044  BP: 118/76 97/62 (!)  113/59 121/64   05/01/24 0453 05/01/24 1308 05/01/24 1924 05/02/24 0453  BP: 106/66 (!) 113/59 128/82 (!) 100/52    10.  Diabetes mellitus.  Hemoglobin A1c 5.6.  Currently on SSI.  Prior to admission patient on Glucophage  500 mg daily.  Resume as needed CBGs ok if they start climbing above 180 will restart metformin   -12/26 CBGs under reasonable control. No changes CBG (last 3)  Recent Labs    05/01/24 1558 05/01/24 2036 05/02/24 0602  GLUCAP 150* 179* 119*    11.  Hyperlipidemia: continue Lipitor  80mg  daily  12.  History of CAD/PCI/CABG/SVT.  Follow-up [name] outpatient  13.  Pancytopenia.  [facility]eme oncology Dr.Pasam outpatient.  Follow-up CBC -No heparin  for DVT prophyllaxis, monitor for bleeding on DAPT transfuse if PLT<20K or Hgb <7.0 -12/25 labs holding today. No action required  -continue to monitor serially    Latest Ref Rng & Units 05/01/2024    5:52 AM 04/28/2024    5:28 AM 04/25/2024    6:00 AM  CBC  WBC 4.0 - 10.5 K/uL 2.4  2.4  2.1   Hemoglobin 13.0 - 17.0 g/dL 8.2  8.2  8.7   Hematocrit 39.0 - 52.0 % 23.7  24.2  25.2   Platelets 150 - 400 K/uL 51  54  59     14.  Cirrhosis of liver.  Imaging suggestive of cirrhosis.  Bili mildly elevated as well as INR 1.3.  Follow-up outpatient  15.  Urinary retention.  In-N-Out catheterization 12/13 x 1.  Placed on Flomax . Messaged nursing to discuss her PVRs. -04/27/24 flomax  d/c'd yesterday, no PVRs documented, will discuss with nursing-- ordered PVRs -12/25 PVRs variable. Had to be cathed once yesterday for pvr of 350.   -continue PVR's for now  16.  Constipation: continue Colace 100 mg daily   12/25 type 4 medium BM   LOS: 8 days A FACE TO FACE EVALUATION WAS PERFORMED  Christopher Roberts 05/02/2024, 9:07 AM     "

## 2024-05-02 NOTE — Progress Notes (Signed)
 Physical Therapy Weekly Progress Note  Patient Details  Name: Christopher Roberts MRN: [id number] Date of Birth: 1945/09/22  Beginning of progress report period: April 25, 2024 End of progress report period: May 02, 2024  Today's Date: 05/02/2024 PT Individual Time: 1220-1315 PT Individual Time Calculation (min): 55 min   Patient has met 5 of 5 short term goals.  Pt demonstrating improved functional balance, RLE strength, and R LE motor coordination.  Patient continues to demonstrate the following deficits muscle weakness, decreased motor planning, decreased safety awareness, and decreased standing balance and hemiplegia and therefore will continue to benefit from skilled PT intervention to increase functional independence with mobility.  Patient progressing toward long term goals..  Continue plan of care.  PT Short Term Goals Week 1:  PT Short Term Goal 1 (Week 1): Pt will perform bed mobility with Mod I and improved initiation/ effort. PT Short Term Goal 1 - Progress (Week 1): Met PT Short Term Goal 2 (Week 1): Pt will perform sit<>stand transfers with overall CGA to no AD. PT Short Term Goal 2 - Progress (Week 1): Met PT Short Term Goal 3 (Week 1): Pt will perform stand pivot transfers with overall CGA using LRAD. PT Short Term Goal 3 - Progress (Week 1): Met PT Short Term Goal 4 (Week 1): Pt will ambulate at least 40 ft using LRAD with CGA. PT Short Term Goal 4 - Progress (Week 1): Met PT Short Term Goal 5 (Week 1): Pt will complete stair navigation training. PT Short Term Goal 5 - Progress (Week 1): Met Week 2:  PT Short Term Goal 1 (Week 2): STG = LTG 2/2 LOS  Skilled Therapeutic Interventions/Progress Updates:  Ambulation/gait training;Community reintegration;DME/adaptive equipment instruction;Neuromuscular re-education;Psychosocial support;Stair training;UE/LE Strength taining/ROM;Balance/vestibular training;Discharge planning;Functional electrical stimulation;Therapeutic  Activities;UE/LE Coordination activities;Therapeutic Exercise;Splinting/orthotics;Patient/family education;Functional mobility training;Disease management/prevention;Cognitive remediation/compensation   Therapy Documentation Precautions:  Precautions Precautions: Fall Recall of Precautions/Restrictions: Impaired Precaution/Restrictions Comments: RLE weakness, RUE weakness Restrictions Weight Bearing Restrictions Per Provider Order: No General:   Vital Signs: Therapy Vitals Pulse Rate: 80 Resp: 17 BP: 108/64 Patient Position (if appropriate): Sitting Oxygen Therapy SpO2: 100 % O2 Device: Room Air   Session Note: Chart reviewed and pt agreeable to therapy. Pt received seated in WC with 0/10 c/o pain. Session focused on functional transfers, balance, ambulation, and activity tolerance to promote safe home mobility and access. Pt initiated session with review of STG as stated above. Pt then completed amb of 27ft using S + RW. Pt also practiced high level amb activities of backward walking, side stepping, and high knee stepping for rounds of 30-46ft using CGA + RW. Pt then completed amb to bed and sit>supine using close S + RW and modI for bed mobility. At end of session, pt was left semi-reclined in bed with alarm engaged, nurse call bell and all needs in reach.      Therapy/Group: Individual Therapy  Amoni Scallan G Willeen Novak 05/02/2024, 1:19 PM

## 2024-05-02 NOTE — Progress Notes (Signed)
 Physical Therapy Session Note  Patient Details  Name: Christopher Roberts MRN: [id number] Date of Birth: [birth year]  Today's Date: 05/02/2024 PT Individual Time: 0800-0843 PT Individual Time Calculation (min): 43 min   Short Term Goals: Week 1:  PT Short Term Goal 1 (Week 1): Pt will perform bed mobility with Mod I and improved initiation/ effort. PT Short Term Goal 2 (Week 1): Pt will perform sit<>stand transfers with overall CGA to no AD. PT Short Term Goal 3 (Week 1): Pt will perform stand pivot transfers with overall CGA using LRAD. PT Short Term Goal 4 (Week 1): Pt will ambulate at least 40 ft using LRAD with CGA. PT Short Term Goal 5 (Week 1): Pt will complete stair navigation training.  Skilled Therapeutic Interventions/Progress Updates:      Pt supine in bed upon arrival. Pt agreeable to therapy. Pt denies any pain.   Supine to sit with HOB elevated and supervision.   Stand pivot transfer bed to [facility] with RW and CGA, verbal cues provided for UE/LE positioning prior to sitting.   Pt required prolonged seated rest break 2/2 SOB/fatigue - pt endorses feeling winded. RA SpO2 100%. Education and demonstation provided regarding pursed lip breathing, pt returned demonstration but required cues for implementation with exertion.   Pt navigated 4 6 inch steps with B handrail and CGA with step to gait, ascending with L LE, descending with L LE. Pt required seated rest break at top of stairs 2/2 fatigue SOB.   Pt requesting to use bathroom. Pt performed short distance ambulatory transfer to [facility] over toilet and WC with CGA. Pt donned/doffed pants with CGA. Pt unable to use bathroom. Pt required seated rest break for fatigue management after donning pants.   Pt performed standing marching 2x10 B with use of RW and CGA.  Pt denies dizziness/lightheadedness throughout session. Pt required prolonged seated rest breaks throughout session 2/2 SOB/fatigue. Verbal cues provided for pursed lip breathing.    Pt seated in [facility] with all needs within reach and chair alarm on.        Therapy Documentation Precautions:  Precautions Precautions: Fall Recall of Precautions/Restrictions: Impaired Precaution/Restrictions Comments: RLE weakness, RUE weakness Restrictions Weight Bearing Restrictions Per Provider Order: No   Therapy/Group: Individual Therapy  [facility] Arnold, [place], DPT  05/02/2024, 8:00 AM

## 2024-05-02 NOTE — Progress Notes (Signed)
 Speech Language Pathology Daily Session Note  Patient Details  Name: TRAVIOUS VANOVER MRN: [id number] Date of Birth: 10-10-1945  Today's Date: 05/02/2024 SLP Individual Time: 1400-1457 SLP Individual Time Calculation (min): 57 min  Short Term Goals: Week 1: SLP Short Term Goal 1 (Week 1): STGs = LTGs d/t ELOS  Skilled Therapeutic Interventions: SLP conducted skilled therapy session targeting cognitive goals. Patient benefited from max assist to recall WRAP memory strategies and SLP provided patient with written handout to promote ongoing carryover of information. Patient provided definitions and examples of each strategy with min assist. SLP then facilitated mildly complex problem solving task where patient utilized symbol system and scanning to solve crossword puzzle task. Patient benefited from min assist for task organization but only supervision assist for accuracy. Patient was left in room with call bell in reach and alarm set. SLP will continue to target goals per plan of care.        Pain Pain Assessment Pain Scale: 0-10 Pain Score: 0-No pain  Therapy/Group: Individual Therapy  Quentina Fronek, M.A., CCC-SLP  Timothee Gali A Axel Frisk 05/02/2024, 2:59 PM

## 2024-05-03 ENCOUNTER — Inpatient Hospital Stay (HOSPITAL_COMMUNITY)

## 2024-05-03 DIAGNOSIS — R509 Fever, unspecified: Secondary | ICD-10-CM

## 2024-05-03 LAB — URINALYSIS, W/ REFLEX TO CULTURE (INFECTION SUSPECTED)
Bilirubin Urine: NEGATIVE
Glucose, UA: NEGATIVE mg/dL
Hgb urine dipstick: NEGATIVE
Ketones, ur: NEGATIVE mg/dL
Nitrite: NEGATIVE
Protein, ur: 100 mg/dL — AB
Specific Gravity, Urine: 1.014 (ref 1.005–1.030)
pH: 9 — ABNORMAL HIGH (ref 5.0–8.0)

## 2024-05-03 LAB — CBC WITH DIFFERENTIAL/PLATELET
Basophils Absolute: 0.1 K/uL (ref 0.0–0.1)
Basophils Relative: 3 %
Eosinophils Absolute: 0 K/uL (ref 0.0–0.5)
Eosinophils Relative: 1 %
HCT: 26.9 % — ABNORMAL LOW (ref 39.0–52.0)
Hemoglobin: 9.2 g/dL — ABNORMAL LOW (ref 13.0–17.0)
Lymphocytes Relative: 40 %
Lymphs Abs: 1.2 K/uL (ref 0.7–4.0)
MCH: 30.8 pg (ref 26.0–34.0)
MCHC: 34.2 g/dL (ref 30.0–36.0)
MCV: 90 fL (ref 80.0–100.0)
Monocytes Absolute: 0.1 K/uL (ref 0.1–1.0)
Monocytes Relative: 2 %
Neutro Abs: 1.7 K/uL (ref 1.7–7.7)
Neutrophils Relative %: 54 %
Platelets: 58 K/uL — ABNORMAL LOW (ref 150–400)
RBC: 2.99 MIL/uL — ABNORMAL LOW (ref 4.22–5.81)
RDW: 18.9 % — ABNORMAL HIGH (ref 11.5–15.5)
WBC: 3.1 K/uL — ABNORMAL LOW (ref 4.0–10.5)
nRBC: 1.9 % — ABNORMAL HIGH (ref 0.0–0.2)

## 2024-05-03 LAB — COMPREHENSIVE METABOLIC PANEL WITH GFR
ALT: 50 U/L — ABNORMAL HIGH (ref 0–44)
AST: 51 U/L — ABNORMAL HIGH (ref 15–41)
Albumin: 3.4 g/dL — ABNORMAL LOW (ref 3.5–5.0)
Alkaline Phosphatase: 149 U/L — ABNORMAL HIGH (ref 38–126)
Anion gap: 10 (ref 5–15)
BUN: 37 mg/dL — ABNORMAL HIGH (ref 8–23)
CO2: 22 mmol/L (ref 22–32)
Calcium: 9.1 mg/dL (ref 8.9–10.3)
Chloride: 106 mmol/L (ref 98–111)
Creatinine, Ser: 1.2 mg/dL (ref 0.61–1.24)
GFR, Estimated: >60 mL/min
Glucose, Bld: 125 mg/dL — ABNORMAL HIGH (ref 70–99)
Potassium: 3.9 mmol/L (ref 3.5–5.1)
Sodium: 138 mmol/L (ref 135–145)
Total Bilirubin: 1.3 mg/dL — ABNORMAL HIGH (ref 0.0–1.2)
Total Protein: 7.4 g/dL (ref 6.5–8.1)

## 2024-05-03 LAB — GLUCOSE, CAPILLARY
Glucose-Capillary: 128 mg/dL — ABNORMAL HIGH (ref 70–99)
Glucose-Capillary: 172 mg/dL — ABNORMAL HIGH (ref 70–99)
Glucose-Capillary: 132 mg/dL — ABNORMAL HIGH (ref 70–99)
Glucose-Capillary: 111 mg/dL — ABNORMAL HIGH (ref 70–99)

## 2024-05-03 NOTE — Progress Notes (Signed)
 Speech Language Pathology Weekly Progress and Session Note  Patient Details  Name: Christopher Roberts MRN: [id number] Date of Birth: October 29, 1945  Beginning of progress report period: April 25, 2024 End of progress report period: May 03, 2024  Today's Date: 05/03/2024 SLP Individual Time: 0900-1000 SLP Individual Time Calculation (min): 60 min  Short Term Goals: Week 1: SLP Short Term Goal 1 (Week 1): STGs = LTGs d/t ELOS    New Short Term Goals: Week 2: SLP Short Term Goal 1 (Week 2): STGs = LTGs d/t ELOS  Weekly Progress Updates: Pt demonstrates steady progress towards ST goals. He presents w/ improving problem solving, functional recall/use of compensatory memory strategies, and expressive/receptive language. Pt/family education ongoing and he would benefit from continued ST to target cognitive-linguistic deficits, maximize pt independence, and reduce caregiver burden. No STGs set d/t original LOS, but he remains on track to meet LTGs at this time.    Intensity: Minumum of 1-2 x/day, 30 to 90 minutes Frequency: 3 to 5 out of 7 days Duration/Length of Stay: 1/1 Treatment/Interventions: Cueing hierarchy;Cognitive remediation/compensation;Speech/Language facilitation;Therapeutic Activities;Functional tasks;Internal/external aids;Patient/family education   Daily Session  Skilled Therapeutic Interventions:  Pt greeted at bedside for tx targeting communication and cognition. He was up in his WC upon SLP arrival, very agreeable to tx tasks. He benefited from minA to utilize written memory aid provided in prev tx session to recall WRAP memory strategies. He then completed a mildly specific word finding task, add to category (concrete and abstract), w/ supervisionA. He also completed a word finding task filling in missing letters w/ supervisionA. He was able to sustain attention throughout tx tasks for ~20 mins w/ only s cues. At the end of tx tasks, he was assisted back to bed and  required only s cues for problem solving and sequencing. He was left w/ the bed alarm set and call light within reach. Recommend cont ST per POC.      Pain  None reported  Therapy/Group: Individual Therapy  [name] 05/03/2024, 9:16 AM

## 2024-05-03 NOTE — Progress Notes (Signed)
 Physical Therapy Session Note  Patient Details  Name: Christopher Roberts MRN: [id number] Date of Birth: [birth year]  Today's Date: 05/03/2024 PT Individual Time: 0655-0805  PT Individual Time Calculation (min): 70 min  Short Term Goals: Week 2:  PT Short Term Goal 1 (Week 2): STG = LTG 2/2 LOS  Skilled Therapeutic Interventions/Progress Updates:  Chart reviewed and pt agreeable to therapy. Pt received semi-reclined in bed with 0/10 c/o pain. Session focused on bed mobility, functional transfers, balance, ambulation, stair navigation, and activity tolerance to promote safe home mobility and access. Pt initiated session with transfer to EOB using distant S. Pt then completed amb to WC using S + RW. Pt taken to therapy gym for time management. In gym, pt completed navigation of 4 steps with close S + B rails, but required rest break after 4 steps, indicating endurance deficit for home set up. Pt then completed 12 step up/down with LLE ascending to practice safe step pattern, and then another 12 step up/downs with RLE ascending for NMR. Pt then requesting toileting. Pt taken to room and pt amb to/from toilet using S + RW and set up A for pericare. Pt then amb 54ft + 28ft using S + RW and slow gait speed. Pt then completed balance exercises of neutral stance, narrow stance, and neutral stance + eyes closed using CGA. At end of session, pt was left seated in [facility] with alarm engaged, nurse call bell and all needs in reach.     Therapy Documentation Precautions:  Precautions Precautions: Fall Recall of Precautions/Restrictions: Impaired Precaution/Restrictions Comments: RLE weakness, RUE weakness Restrictions Weight Bearing Restrictions Per Provider Order: No General:      Therapy/Group: Individual Therapy   Warrick KANDICE Raspberry 05/03/2024, 8:07 AM

## 2024-05-03 NOTE — Progress Notes (Signed)
 Occupational Therapy Session Note  Patient Details  Name: Christopher Roberts MRN: [id number] Date of Birth: 06-25-1945  Today's Date: 05/03/2024 OT Individual Time: 8467-8362 OT Individual Time Calculation (min): 65 min    Short Term Goals: Week 2:  OT Short Term Goal 1 (Week 2): STG =LTG d.t ELOS  Skilled Therapeutic Interventions/Progress Updates:  Patient agreeable to participate in OT session. Reports no pain level.   Patient participated in skilled OT session focusing on toileting, UB/LB bathing, dressing, functional mobility, and dynamic balance. Patient received in bed. Patient reports feeling wet, completed functional mobility to bathroom. Patient completed toileting and UB/ LB bathing on toilet CGA and SU for UB. Patient then able to complete functional mobility to sink, however required seat for grooming/ hygiene activities. Patient then completed functional mobility 75 ft with RW into hall way with CGA and 1 verbal cue for body awareness and spatial awareness to R side. Patient then completed dynamic standing balance activity utilizing RW for marches to increase balance with toileitng, and LB ADLS. Patient completed with CGA. OT graded activity up, removing RW, allowing patient to complete with single hand support. Patient able to complete 3x marches before requiring seat. On attempt 2 patient able to increase lift from both LE, maintain balance with CGA to min with one hand support. Following patient reported increased fatigue, request to end session. All needs in reach alarm on.    Therapy Documentation Precautions:  Precautions Precautions: Fall Recall of Precautions/Restrictions: Impaired Precaution/Restrictions Comments: RLE weakness, RUE weakness Restrictions Weight Bearing Restrictions Per Provider Order: No  Therapy/Group: Individual Therapy  D'mariea L Wally Behan 05/03/2024, 7:55 AM

## 2024-05-03 NOTE — Progress Notes (Signed)
 "                                                        PROGRESS NOTE   Subjective/Complaints:  Pt doing well, slept well, denies pain, LBM this morning, urinating fine. No other complaints or concerns, however overnight had a temp of 100.7 and not mentioned to provider last night or this morning. Room is very warm, pt says he feels comfortable.   ROS: as per HPI. Denies CP, SOB, abd pain, N/V/D/C, or any other complaints at this time.    Objective:   No results found. Recent Labs    05/01/24 0552  WBC 2.4*  HGB 8.2*  HCT 23.7*  PLT 51*   Recent Labs    05/01/24 0552  NA 135  K 4.3  CL 106  CO2 20*  GLUCOSE 107*  BUN 24*  CREATININE 1.14  CALCIUM  8.5*    Intake/Output Summary (Last 24 hours) at 05/03/2024 0817 Last data filed at 05/03/2024 0541 Gross per 24 hour  Intake --  Output 1000 ml  Net -1000 ml        Physical Exam: Vital Signs Blood pressure 101/60, pulse 86, temperature 97.8 F (36.6 C), temperature source Oral, resp. rate 18, height 5' 10 (1.778 m), weight 76.6 kg, SpO2 95%.   General: No acute distress, sitting up in w/c, well appearing. Room very warm Mood and affect are appropriate, a little flat Heart: Regular rate and rhythm no rubs murmurs or extra sounds Lungs: Clear to auscultation, breathing unlabored, no rales or wheezes Abdomen: Positive bowel sounds, soft nontender to palpation, nondistended Extremities: No clubbing, cyanosis, or edema Skin: No evidence of breakdown, no evidence of rash to exposed surfaces, scattered bruises, L cartoid surgical incision remains c/d/I  PRIOR EXAMS: Neurologic: Cranial nerves II through XII intact, motor strength is 5/5 in left UE/LE and 5/5 right deltoid, bicep, tricep, grip, 4/5 hip flexor, knee extensors, ankle dorsiflexor and plantar flexor Sensory exam normal sensation to light touch  in bilateral upper and left lower lower extremities Reduced LT RLE Oriented to person and place, month,  day Cerebellar exam mild dysmetria Right finger nose finger  Musculoskeletal: Full range of motion in all 4 extremities. No joint swelling    Assessment/Plan: 1. Functional deficits which require 3+ hours per day of interdisciplinary therapy in a comprehensive inpatient rehab setting. Physiatrist is providing close team supervision and 24 hour management of active medical problems listed below. Physiatrist and rehab team continue to assess barriers to discharge/monitor patient progress toward functional and medical goals  Care Tool:  Bathing    Body parts bathed by patient: Right arm, Left upper leg, Left arm, Right lower leg, Chest, Left lower leg, Abdomen, Face, Front perineal area, Buttocks, Right upper leg         Bathing assist Assist Level: Supervision/Verbal cueing     Upper Body Dressing/Undressing Upper body dressing   What is the patient wearing?: Pull over shirt    Upper body assist Assist Level: Supervision/Verbal cueing    Lower Body Dressing/Undressing Lower body dressing      What is the patient wearing?: Underwear/pull up, Pants     Lower body assist Assist for lower body dressing: Contact Guard/Touching assist     Toileting Toileting    Toileting assist Assist for  toileting: Minimal Assistance - Patient > 75%     Transfers Chair/bed transfer  Transfers assist     Chair/bed transfer assist level: Contact Guard/Touching assist     Locomotion Ambulation   Ambulation assist      Assist level: Contact Guard/Touching assist Assistive device: Walker-rolling Max distance: 100 feet   Walk 10 feet activity   Assist     Assist level: Contact Guard/Touching assist Assistive device: Walker-rolling   Walk 50 feet activity   Assist Walk 50 feet with 2 turns activity did not occur: Safety/medical concerns  Assist level: Contact Guard/Touching assist Assistive device: Walker-rolling    Walk 150 feet activity   Assist Walk 150 feet  activity did not occur: Safety/medical concerns (fatigue/SOB)         Walk 10 feet on uneven surface  activity   Assist Walk 10 feet on uneven surfaces activity did not occur: Safety/medical concerns         Wheelchair     Assist Is the patient using a wheelchair?: Yes Type of Wheelchair: Manual    Wheelchair assist level: Dependent - Patient 0% Max wheelchair distance: 15 ft    Wheelchair 50 feet with 2 turns activity    Assist        Assist Level: Dependent - Patient 0%   Wheelchair 150 feet activity     Assist      Assist Level: Dependent - Patient 0%   Blood pressure 101/60, pulse 86, temperature 97.8 F (36.6 C), temperature source Oral, resp. rate 18, height 5' 10 (1.778 m), weight 76.6 kg, SpO2 95%.  Medical Problem List and Plan: 1. 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  Antithrombotics: -DVT/anticoagulation:  Mechanical: Antiembolism stockings, thigh (TED hose) Bilateral lower extremities             -antiplatelet therapy: Aspirin  81 mg daily and Plavix  75 mg daily  3. Pain Management: Tylenol  as needed  4. Mood/Behavior/Sleep: Prozac  20 mg daily.             -antipsychotic agents: N/A  -melatonin prn  5. Neuropsych/cognition: This patient is capable of making decisions on his own behalf.  6. Skin/Wound Care: Routine skin checks  7. Fluids/Electrolytes/Nutrition: Routine and analysis with follow-up chemistries  8.  Symptomatic left carotid artery stenosis.  Status post stenting/angioplasty 04/21/2024 per Dr. Gretta  9.  Hypotension: continue ProAmatine  10 mg 3 times daily.  Monitor with increased mobility  -12/25-27 BPs remain  stable, continue to monitor  Vitals:   04/29/24 2042 04/30/24 0556 04/30/24 1305 04/30/24 2044  BP: 118/76 97/62 (!) 113/59 121/64   05/01/24 0453 05/01/24 1308 05/01/24 1924 05/02/24 0453  BP: 106/66 (!) 113/59 128/82 (!) 100/52   05/02/24 1139 05/02/24 1346 05/02/24 2002 05/03/24 0541  BP: 108/64 131/70 129/85 101/60    10.  Diabetes mellitus.  Hemoglobin A1c 5.6.  Currently on SSI.  Prior to admission patient on Glucophage  500 mg daily.  Resume as needed CBGs ok if they start climbing above 180 will  restart metformin   -12/27 CBGs under reasonable control. Only once 191, mostly lower than 150s; No changes for now.   CBG (last 3)  Recent Labs    05/02/24 1641 05/02/24 2004 05/03/24 0534  GLUCAP 191* 112* 128*    11.  Hyperlipidemia: continue Lipitor  80mg  daily  12.  History of CAD/PCI/CABG/SVT.  Follow-up [name] outpatient  13.  Pancytopenia.  Heme oncology Dr.Pasam outpatient.  Follow-up CBC -No heparin  for DVT prophyllaxis, monitor for bleeding on DAPT transfuse if PLT<20K or Hgb <7.0 -12/25 labs holding today. No action required  -continue to monitor serially -05/03/24 rechecking labs d/t fever as below    Latest Ref Rng & Units 05/01/2024    5:52 AM 04/28/2024    5:28 AM 04/25/2024    6:00 AM  CBC  WBC 4.0 - 10.5 K/uL 2.4  2.4  2.1   Hemoglobin 13.0 - 17.0 g/dL 8.2  8.2  8.7   Hematocrit 39.0 - 52.0 % 23.7  24.2  25.2   Platelets 150 - 400 K/uL 51  54  59     14.  Cirrhosis of liver.  Imaging suggestive of cirrhosis.  Bili mildly elevated as well as INR 1.3.  Follow-up outpatient  15.  Urinary retention.  In-N-Out catheterization 12/13 x 1.  Placed on Flomax . Messaged nursing to discuss her PVRs. -04/27/24 flomax  d/c'd yesterday, no PVRs documented, will discuss with nursing-- ordered PVRs -12/25 PVRs variable. Had to be cathed once yesterday for pvr of 350.   -continue PVR's for now -05/03/24 only a few PVRs documented, all low, no caths; spiked fever  last night, checking labs/urine/cxr as below.  16.  Constipation: continue Colace 100 mg daily -12/27 type 5 medium BM   17. Fever 12/26: -05/03/24 pt with 100.7 temp last night, not reported to provider; room is very warm   -AM temp normal   -for precaution, will get CBC, CMP, U/A, CXR   -hold off on empiric abx just yet-- let w/up guide tx.    I spent >18mins performing patient care related activities, including face to face time, documentation time, chart review/ordering of labs, discussion of fever/labs with nursing staff, and overall coordination of care.   LOS: 9 days A FACE TO FACE EVALUATION WAS PERFORMED  [address] 05/03/2024, 8:17 AM     "

## 2024-05-03 NOTE — Plan of Care (Signed)
" °  Problem: Consults Goal: RH STROKE PATIENT EDUCATION Description: See Patient Education module for education specifics  Outcome: Progressing Goal: Diabetes Guidelines if Diabetic/Glucose > 140 Description: If diabetic or lab glucose is > 140 mg/dl - Initiate Diabetes/Hyperglycemia Guidelines & Document Interventions  Outcome: Progressing   Problem: RH BOWEL ELIMINATION Goal: RH STG MANAGE BOWEL WITH ASSISTANCE Description: STG Manage Bowel with mod I Assistance. Outcome: Progressing Goal: RH STG MANAGE BOWEL W/MEDICATION W/ASSISTANCE Description: STG Manage Bowel with Medication with mod I Assistance. Outcome: Progressing   Problem: RH BLADDER ELIMINATION Goal: RH STG MANAGE BLADDER WITH ASSISTANCE Description: STG Manage Bladder With mod I Assistance Outcome: Progressing Goal: RH STG MANAGE BLADDER WITH MEDICATION WITH ASSISTANCE Description: STG Manage Bladder With Medication With mod I Assistance. Outcome: Progressing   Problem: RH KNOWLEDGE DEFICIT Goal: RH STG INCREASE KNOWLEDGE OF DIABETES Description: Patient and spouse will be able to manage DM using educational resources for medications and dietary modification independently Outcome: Progressing Goal: RH STG INCREASE KNOWLEDGE OF HYPERTENSION Description: Patient and spouse will be able to manage HTN using educational resources for medications and dietary modification independently Outcome: Progressing Goal: RH STG INCREASE KNOWLEDGE OF DYSPHAGIA/FLUID INTAKE Description: Patient and spouse will be able to manage Dysphagia using educational resources for medications and dietary modification independently Outcome: Progressing Goal: RH STG INCREASE KNOWLEGDE OF HYPERLIPIDEMIA Description: Patient and spouse will be able to manage HLD using educational resources for medications and dietary modification independently Outcome: Progressing Goal: RH STG INCREASE KNOWLEDGE OF STROKE PROPHYLAXIS Description: Patient and spouse  will be able to manage secondary risks using educational resources for medications and dietary modification independently Outcome: Progressing   "

## 2024-05-04 DIAGNOSIS — N390 Urinary tract infection, site not specified: Secondary | ICD-10-CM

## 2024-05-04 LAB — GLUCOSE, CAPILLARY
Glucose-Capillary: 115 mg/dL — ABNORMAL HIGH (ref 70–99)
Glucose-Capillary: 253 mg/dL — ABNORMAL HIGH (ref 70–99)
Glucose-Capillary: 141 mg/dL — ABNORMAL HIGH (ref 70–99)
Glucose-Capillary: 153 mg/dL — ABNORMAL HIGH (ref 70–99)

## 2024-05-04 MED ORDER — CEPHALEXIN 250 MG PO CAPS
500.0000 mg | ORAL_CAPSULE | Freq: Three times a day (TID) | ORAL | Status: AC
  Administered 2024-05-04 – 2024-05-11 (×21): 500 mg via ORAL
  Filled 2024-05-04 (×2): qty 2

## 2024-05-04 NOTE — Progress Notes (Signed)
 "                                                        PROGRESS NOTE   Subjective/Complaints:  Pt doing ok but feels a bit lightheaded today, not completely himself. No further fevers but urine looked cloudy/iffy, when asked, pt does endorse some urgency that he didn't previously have. Nursing also having to change his bedding recently d/t incontinence which isn't his usual. Suspect UTI, pt agrees, will start abx and hopefully this will help how he feels.  Slept ok, denies pain, LBM this morning. No other complaints or concerns.   ROS: as per HPI. Denies CP, SOB, abd pain, N/V/D/C, or any other complaints at this time.    Objective:   DG Chest 2 View Result Date: 05/03/2024 CLINICAL DATA:  Fever. EXAM: CHEST - 2 VIEW COMPARISON:  None available. FINDINGS: Prior median sternotomy.The cardiomediastinal contours are normal. Subpleural reticulation with a slight basilar predominant distribution. Calcified granulomas in the right lung. Pulmonary vasculature is normal. No consolidation, pleural effusion, or pneumothorax. Chronic right shoulder arthropathy. No acute osseous abnormalities are seen. IMPRESSION: 1. No acute findings. 2. Subpleural reticulation with a slight basilar predominant distribution, suggesting interstitial lung disease. Electronically Signed   By: Andrea Gasman M.D.   On: 05/03/2024 16:21   Recent Labs    05/03/24 1431  WBC 3.1*  HGB 9.2*  HCT 26.9*  PLT 58*   Recent Labs    05/03/24 1431  NA 138  K 3.9  CL 106  CO2 22  GLUCOSE 125*  BUN 37*  CREATININE 1.20  CALCIUM  9.1    Intake/Output Summary (Last 24 hours) at 05/04/2024 1121 Last data filed at 05/04/2024 0827 Gross per 24 hour  Intake 960 ml  Output 742 ml  Net 218 ml        Physical Exam: Vital Signs Blood pressure 118/68, pulse 79, temperature 98.1 F (36.7 C), resp. rate 18, height 5' 10 (1.778 m), weight 76.6 kg, SpO2 98%.   General: No acute distress, sitting up in w/c, looks a  little tired.  Mood and affect are appropriate, a little flat Heart: Regular rate and rhythm no rubs murmurs or extra sounds Lungs: Clear to auscultation, breathing unlabored, no rales or wheezes Abdomen: Positive bowel sounds, soft nontender to palpation, nondistended Extremities: No clubbing, cyanosis, or edema Skin: No evidence of breakdown, no evidence of rash to exposed surfaces, scattered bruises, L cartoid surgical incision remains c/d/I  PRIOR EXAMS: Neurologic: Cranial nerves II through XII intact, motor strength is 5/5 in left UE/LE and 5/5 right deltoid, bicep, tricep, grip, 4/5 hip flexor, knee extensors, ankle dorsiflexor and plantar flexor Sensory exam normal sensation to light touch  in bilateral upper and left lower lower extremities Reduced LT RLE Oriented to person and place, month, day Cerebellar exam mild dysmetria Right finger nose finger  Musculoskeletal: Full range of motion in all 4 extremities. No joint swelling    Assessment/Plan: 1. Functional deficits which require 3+ hours per day of interdisciplinary therapy in a comprehensive inpatient rehab setting. Physiatrist is providing close team supervision and 24 hour management of active medical problems listed below. Physiatrist and rehab team continue to assess barriers to discharge/monitor patient progress toward functional and medical goals  Care Tool:  Bathing    Body  parts bathed by patient: Right arm, Left upper leg, Left arm, Right lower leg, Chest, Left lower leg, Abdomen, Face, Front perineal area, Buttocks, Right upper leg         Bathing assist Assist Level: Supervision/Verbal cueing     Upper Body Dressing/Undressing Upper body dressing   What is the patient wearing?: Pull over shirt    Upper body assist Assist Level: Supervision/Verbal cueing    Lower Body Dressing/Undressing Lower body dressing      What is the patient wearing?: Underwear/pull up, Pants     Lower body assist  Assist for lower body dressing: Contact Guard/Touching assist     Toileting Toileting    Toileting assist Assist for toileting: Minimal Assistance - Patient > 75%     Transfers Chair/bed transfer  Transfers assist     Chair/bed transfer assist level: Contact Guard/Touching assist     Locomotion Ambulation   Ambulation assist      Assist level: Contact Guard/Touching assist Assistive device: Walker-rolling Max distance: 100 feet   Walk 10 feet activity   Assist     Assist level: Contact Guard/Touching assist Assistive device: Walker-rolling   Walk 50 feet activity   Assist Walk 50 feet with 2 turns activity did not occur: Safety/medical concerns  Assist level: Contact Guard/Touching assist Assistive device: Walker-rolling    Walk 150 feet activity   Assist Walk 150 feet activity did not occur: Safety/medical concerns (fatigue/SOB)         Walk 10 feet on uneven surface  activity   Assist Walk 10 feet on uneven surfaces activity did not occur: Safety/medical concerns         Wheelchair     Assist Is the patient using a wheelchair?: Yes Type of Wheelchair: Manual    Wheelchair assist level: Dependent - Patient 0% Max wheelchair distance: 15 ft    Wheelchair 50 feet with 2 turns activity    Assist        Assist Level: Dependent - Patient 0%   Wheelchair 150 feet activity     Assist      Assist Level: Dependent - Patient 0%   Blood pressure 118/68, pulse 79, temperature 98.1 F (36.7 C), resp. rate 18, height 5' 10 (1.778 m), weight 76.6 kg, SpO2 98%.  Medical Problem List and Plan: 1. 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  Antithrombotics: -DVT/anticoagulation:  Mechanical: Antiembolism stockings, thigh (TED hose) Bilateral lower extremities             -antiplatelet therapy: Aspirin  81 mg daily and Plavix  75 mg daily  3. Pain Management: Tylenol  as needed  4. Mood/Behavior/Sleep: Prozac  20 mg daily.             -antipsychotic agents: N/A  -melatonin prn  5. Neuropsych/cognition: This patient is capable of making decisions on his own behalf.  6. Skin/Wound Care: Routine skin checks  7. Fluids/Electrolytes/Nutrition: Routine and analysis with follow-up chemistries  8.  Symptomatic left carotid artery stenosis.  Status post stenting/angioplasty 04/21/2024  per Dr. Gretta  9.  Hypotension: continue ProAmatine  10 mg 3 times daily.  Monitor with increased mobility  -12/25-28 BPs remain stable, continue to monitor  Vitals:   04/30/24 2044 05/01/24 0453 05/01/24 1308 05/01/24 1924  BP: 121/64 106/66 (!) 113/59 128/82   05/02/24 0453 05/02/24 1139 05/02/24 1346 05/02/24 2002  BP: (!) 100/52 108/64 131/70 129/85   05/03/24 0541 05/03/24 1504 05/03/24 1959 05/04/24 0610  BP: 101/60 132/71 117/69 118/68    10.  Diabetes mellitus.  Hemoglobin A1c 5.6.  Currently on SSI.  Prior to admission patient on Glucophage  500 mg daily.  Resume as needed CBGs ok if they start climbing above 180 will restart metformin   -12/27 CBGs under reasonable control. Only once 191, mostly lower than 150s; No changes for now.-- much better 12/28  CBG (last 3)  Recent Labs    05/03/24 1612 05/03/24 2001 05/04/24 0612  GLUCAP 132* 111* 115*    11.  Hyperlipidemia: continue Lipitor  80mg  daily  12.  History of CAD/PCI/CABG/SVT.  Follow-up [name] outpatient  13.  Pancytopenia.  Heme oncology Dr.Pasam outpatient.  Follow-up CBC -No heparin  for DVT prophyllaxis, monitor for bleeding on DAPT transfuse if PLT<20K or Hgb <7.0 -12/25 labs holding today. No action required  -continue to monitor serially -05/04/24  labs from yesterday stable/slightly better.    Latest Ref Rng & Units 05/03/2024    2:31 PM 05/01/2024    5:52 AM 04/28/2024    5:28 AM  CBC  WBC 4.0 - 10.5 K/uL 3.1  2.4  2.4   Hemoglobin 13.0 - 17.0 g/dL 9.2  8.2  8.2   Hematocrit 39.0 - 52.0 % 26.9  23.7  24.2   Platelets 150 - 400 K/uL 58  51  54     14.  Cirrhosis of liver.  Imaging suggestive of cirrhosis.  Bili mildly elevated as well as INR 1.3.  Follow-up outpatient  15.  Urinary retention.  In-N-Out catheterization 12/13 x 1.  Placed on Flomax . Messaged nursing to discuss her PVRs. -04/27/24 flomax  d/c'd yesterday, no PVRs documented, will discuss with nursing-- ordered PVRs -12/25 PVRs variable. Had to be cathed once yesterday for pvr of 350.   -continue PVR's for now -05/03/24 only a few PVRs documented, all low, no caths; spiked fever last night, checking labs/urine/cxr as below. -05/04/24 labs yesterday stable/ok, but U/A looking iffy with cloudy urine, trace leuks, 21-50 WBC though no bacteria-- however pt not feeling well today and having incontinence, highly suspect UTI-- STARTING Keflex  500mg  TID x7d, UCx pending.   16.  Constipation: continue Colace 100 mg daily -05/04/24 LBM this AM.   17. Fever 12/26: -05/03/24 pt with 100.7 temp last night, not reported to provider; room is very warm-- getting CBC, CMP, CXR, U/A, hold off on empiric abx just yet-- let w/up guide tx.  -05/04/24 no further fevers, CBC stable, CMP with mild transaminitis, CXR neg, U/A iffy as mentioned above, suspect UTI, start Keflex  as above. UCx pending.     LOS: 10 days A FACE TO FACE EVALUATION WAS PERFORMED  [address] 05/04/2024, 11:21 AM     "

## 2024-05-04 NOTE — Plan of Care (Signed)
" °  Problem: Consults Goal: RH STROKE PATIENT EDUCATION Description: See Patient Education module for education specifics  Outcome: Progressing Goal: Diabetes Guidelines if Diabetic/Glucose > 140 Description: If diabetic or lab glucose is > 140 mg/dl - Initiate Diabetes/Hyperglycemia Guidelines & Document Interventions  Outcome: Progressing   Problem: RH BOWEL ELIMINATION Goal: RH STG MANAGE BOWEL WITH ASSISTANCE Description: STG Manage Bowel with mod I Assistance. Outcome: Progressing Goal: RH STG MANAGE BOWEL W/MEDICATION W/ASSISTANCE Description: STG Manage Bowel with Medication with mod I Assistance. Outcome: Progressing   Problem: RH BLADDER ELIMINATION Goal: RH STG MANAGE BLADDER WITH ASSISTANCE Description: STG Manage Bladder With mod I Assistance Outcome: Progressing Goal: RH STG MANAGE BLADDER WITH MEDICATION WITH ASSISTANCE Description: STG Manage Bladder With Medication With mod I Assistance. Outcome: Progressing   Problem: RH KNOWLEDGE DEFICIT Goal: RH STG INCREASE KNOWLEDGE OF DIABETES Description: Patient and spouse will be able to manage DM using educational resources for medications and dietary modification independently Outcome: Progressing Goal: RH STG INCREASE KNOWLEDGE OF HYPERTENSION Description: Patient and spouse will be able to manage HTN using educational resources for medications and dietary modification independently Outcome: Progressing Goal: RH STG INCREASE KNOWLEDGE OF DYSPHAGIA/FLUID INTAKE Description: Patient and spouse will be able to manage Dysphagia using educational resources for medications and dietary modification independently Outcome: Progressing Goal: RH STG INCREASE KNOWLEGDE OF HYPERLIPIDEMIA Description: Patient and spouse will be able to manage HLD using educational resources for medications and dietary modification independently Outcome: Progressing Goal: RH STG INCREASE KNOWLEDGE OF STROKE PROPHYLAXIS Description: Patient and spouse  will be able to manage secondary risks using educational resources for medications and dietary modification independently Outcome: Progressing   "

## 2024-05-04 NOTE — Plan of Care (Signed)
" °  Problem: RH BOWEL ELIMINATION Goal: RH STG MANAGE BOWEL WITH ASSISTANCE Description: STG Manage Bowel with mod I Assistance. Outcome: Progressing Goal: RH STG MANAGE BOWEL W/MEDICATION W/ASSISTANCE Description: STG Manage Bowel with Medication with mod I Assistance. Outcome: Progressing   Problem: RH BLADDER ELIMINATION Goal: RH STG MANAGE BLADDER WITH ASSISTANCE Description: STG Manage Bladder With mod I Assistance Outcome: Progressing Goal: RH STG MANAGE BLADDER WITH MEDICATION WITH ASSISTANCE Description: STG Manage Bladder With Medication With mod I Assistance. Outcome: Progressing   Problem: RH KNOWLEDGE DEFICIT Goal: RH STG INCREASE KNOWLEDGE OF DYSPHAGIA/FLUID INTAKE Description: Patient and spouse will be able to manage Dysphagia using educational resources for medications and dietary modification independently Outcome: Progressing   "

## 2024-05-04 NOTE — Progress Notes (Signed)
 Physical Therapy Session Note  Patient Details  Name: Christopher Roberts MRN: [id number] Date of Birth: 12/06/1945  Today's Date: 05/04/2024 PT Individual Time: 0900-0943 PT Individual Time Calculation (min): 43 min   Short Term Goals: Week 2:  PT Short Term Goal 1 (Week 2): STG = LTG 2/2 LOS  Skilled Therapeutic Interventions/Progress Updates:      Pt supine in bed upon arrival. Pt agreeable to therapy. Pt endorses not feeling great this morning 2/2 dizziness when getting up this morning to go to the bathroom.   Pt incontinent of bladder. Changed brief with total A   Orthostatic Vitals assessed: Notified PA of pt vitals and symptoms during AM rounds.   Supine: BP 112/75 HR 72 - asymptomatic Seated EOB: BP 100/54 HR 83 - pt endorsing feeling lightheaded initially however subsided with prolonged sitting EOB Donned ted hose Pt donned paper shirt with max A, pants with mod A for R LE. Pt required seated rest break 2/2 SOB/fatigue Reasessed while seated EOB: 103/58 HR 82 Standing: pt endorses ligthheadedness with standing, pt able to tolerate standing long enough to donn his pants with total A but unable to stand long enough for BP reading. BP 110/63 HR 85 immediately after sitting. Pt endorses symptoms subsided with seated rest break.   Pt performed stand pivot transfer with RW and CGA - pt denies any symptoms.   Pt seated in [facility] with all needs within reach and PA/nurse in room at end of session.         Therapy Documentation Precautions:  Precautions Precautions: Fall Recall of Precautions/Restrictions: Impaired Precaution/Restrictions Comments: RLE weakness, RUE weakness Restrictions Weight Bearing Restrictions Per Provider Order: No  Therapy/Group: Individual Therapy  [facility] Doreene Orris, Carpenter, DPT  05/04/2024, 7:37 AM

## 2024-05-05 LAB — BASIC METABOLIC PANEL WITH GFR
Anion gap: 11 (ref 5–15)
BUN: 46 mg/dL — ABNORMAL HIGH (ref 8–23)
CO2: 20 mmol/L — ABNORMAL LOW (ref 22–32)
Calcium: 8.7 mg/dL — ABNORMAL LOW (ref 8.9–10.3)
Chloride: 106 mmol/L (ref 98–111)
Creatinine, Ser: 1.27 mg/dL — ABNORMAL HIGH (ref 0.61–1.24)
GFR, Estimated: 57 mL/min — ABNORMAL LOW
Glucose, Bld: 141 mg/dL — ABNORMAL HIGH (ref 70–99)
Potassium: 4 mmol/L (ref 3.5–5.1)
Sodium: 137 mmol/L (ref 135–145)

## 2024-05-05 LAB — CBC
HCT: 26 % — ABNORMAL LOW (ref 39.0–52.0)
Hemoglobin: 8.9 g/dL — ABNORMAL LOW (ref 13.0–17.0)
MCH: 30.9 pg (ref 26.0–34.0)
MCHC: 34.2 g/dL (ref 30.0–36.0)
MCV: 90.3 fL (ref 80.0–100.0)
Platelets: 61 K/uL — ABNORMAL LOW (ref 150–400)
RBC: 2.88 MIL/uL — ABNORMAL LOW (ref 4.22–5.81)
RDW: 19.1 % — ABNORMAL HIGH (ref 11.5–15.5)
WBC: 3.1 K/uL — ABNORMAL LOW (ref 4.0–10.5)
nRBC: 0 % (ref 0.0–0.2)

## 2024-05-05 LAB — GLUCOSE, CAPILLARY
Glucose-Capillary: 142 mg/dL — ABNORMAL HIGH (ref 70–99)
Glucose-Capillary: 170 mg/dL — ABNORMAL HIGH (ref 70–99)
Glucose-Capillary: 158 mg/dL — ABNORMAL HIGH (ref 70–99)
Glucose-Capillary: 217 mg/dL — ABNORMAL HIGH (ref 70–99)

## 2024-05-05 LAB — URINE CULTURE: Culture: >=100000 — AB

## 2024-05-05 MED ORDER — SODIUM CHLORIDE 0.45 % IV SOLN: INTRAVENOUS | Status: DC

## 2024-05-05 NOTE — Progress Notes (Signed)
 Patient ID: Christopher Roberts, male   DOB: [birth year], 78 y.o.   MRN: [id number]  This SW covering for primary SW, Becky Dupree.   22am- SW spoke with pt wife to follow-up about family edu. Reports she was getting over the flu and the reason she was unable to be at the hospital. Barbar edu tomorrow 1pm-4pm. SW informed on HHA in place.   Graeme Jude, MSW, LCSW Office: [phone] Cell: [phone] Fax: [phone]

## 2024-05-05 NOTE — Progress Notes (Signed)
 Occupational Therapy Session Note  Patient Details  Name: Christopher Roberts MRN: [id number] Date of Birth: [birth year]  Today's Date: 05/05/2024 OT Individual Time: 8952-8841 OT Individual Time Calculation (min): 71 min    Short Term Goals: Week 2:  OT Short Term Goal 1 (Week 2): STG =LTG d.t ELOS  Skilled Therapeutic Interventions/Progress Updates:     Pt received sitting up in wc dressed and ready for the day with all ADL needs met. Thigh high TEDs and ACE wraps donned prior to OT arrival. Pt receptive to skilled OT session reporting 0/10 pain- OT offering intermittent rest breaks, repositioning, and therapeutic support to optimize participation in therapy session. Pt limited by dizziness and urinary urgency this session 2/2 UTI. Encouraged fluids throughout session.   Transported to therapy gym total A in w/c for energy conservation. Pt urgently requesting to use restroom. Utilized hallway bathroom and Pt completed stand pivot wc <> elevated toilet seat with CGA and completed 3/3 toileting tasks with CGA, however no void. Later during session, Pt requesting to use restroom again completing transfers and toileting at same levels as previous- small void in toilet.   Limited standing during session 2/2 to dizziness and increased feeling of need to void during each instance of standing. Vitals assess to determine onset of orthostatic hypotension, however Pt unable to tolerate upright standing position for duration of vital assessment:   Vitals seated: HR 86 BP 133/64(84) SPO2 100%  Vitals immediately flowing stand HR 89 BP 117/64*81)SPO2 100%  Pt completed the following exercises in seated position for endurance training and to improve B U/LE strength for ADLs:  -Chest press, 4 # weighted dowel, 2x10 reps  -Bicep curs, 4 # weighted dowel, 2x10 reps  -Anterior shoulder flexion, 4 # weighted dowel, 2x10 reps  -Alternating toe taps, 2x10 reps R/L  -Alternating heel raises, 2x10 reps  R/L  -Alternating marches, 3# ankle weights, 2x10 reps R/L  -Alternating knee flexion, 3# ankle weights, 2x10 reps R/L Pt required increased time for rest breaks between each exercise 2/2 endurance deficit.   Transported pt back to room in wc. Pt was left resting in wc with call bell in reach, seatbelt alarm on, and all needs met.    Therapy Documentation Precautions:  Precautions Precautions: Fall Recall of Precautions/Restrictions: Impaired Precaution/Restrictions Comments: RLE weakness, RUE weakness Restrictions Weight Bearing Restrictions Per Provider Order: No   Therapy/Group: Individual Therapy  Christopher Roberts 05/05/2024, 11:35 AM

## 2024-05-05 NOTE — Progress Notes (Addendum)
 "                                                        PROGRESS NOTE   Subjective/Complaints:  Labs reviewed feels like he has to urinate but nothing comes out, PVRs reviewed ok (all below )  No sweats or chills, discussed UA Cx results  ROS: as per HPI. Denies CP, SOB, abd pain, N/V/D/C, or any other complaints at this time.    Objective:   DG Chest 2 View Result Date: 05/03/2024 CLINICAL DATA:  Fever. EXAM: CHEST - 2 VIEW COMPARISON:  None available. FINDINGS: Prior median sternotomy.The cardiomediastinal contours are normal. Subpleural reticulation with a slight basilar predominant distribution. Calcified granulomas in the right lung. Pulmonary vasculature is normal. No consolidation, pleural effusion, or pneumothorax. Chronic right shoulder arthropathy. No acute osseous abnormalities are seen. IMPRESSION: 1. No acute findings. 2. Subpleural reticulation with a slight basilar predominant distribution, suggesting interstitial lung disease. Electronically Signed   By: Andrea Gasman M.D.   On: 05/03/2024 16:21   Recent Labs    05/03/24 1431 05/05/24 0522  WBC 3.1* 3.1*  HGB 9.2* 8.9*  HCT 26.9* 26.0*  PLT 58* 61*   Recent Labs    05/03/24 1431 05/05/24 0522  NA 138 137  K 3.9 4.0  CL 106 106  CO2 22 20*  GLUCOSE 125* 141*  BUN 37* 46*  CREATININE 1.20 1.27*  CALCIUM  9.1 8.7*    Intake/Output Summary (Last 24 hours) at 05/05/2024 9178 Last data filed at 05/04/2024 1939 Gross per 24 hour  Intake 960 ml  Output 526 ml  Net 434 ml        Physical Exam: Vital Signs Blood pressure 115/60, pulse 78, temperature 98 F (36.7 C), resp. rate 16, height 5' 10 (1.778 m), weight 76.6 kg, SpO2 96%.   General: No acute distress, sitting up in w/c, looks a little tired.  Mood and affect are appropriate, a little flat Heart: Regular rate and rhythm no rubs murmurs or extra sounds Lungs: Clear to auscultation, breathing unlabored, no rales or wheezes Abdomen:  Positive bowel sounds, soft nontender to palpation, nondistended Extremities: No clubbing, cyanosis, or edema Skin: No evidence of breakdown, no evidence of rash to exposed surfaces, scattered bruises, L cartoid surgical incision remains c/d/I  PRIOR EXAMS: Neurologic: Cranial nerves II through XII intact, motor strength is 5/5 in left UE/LE and 5/5 right deltoid, bicep, tricep, grip, 4/5 hip flexor, knee extensors, ankle dorsiflexor and plantar flexor Sensory exam normal sensation to light touch  in bilateral upper and left lower lower extremities Reduced LT RLE Oriented to person and place, month, day Cerebellar exam mild dysmetria Right finger nose finger  Musculoskeletal: Full range of motion in all 4 extremities. No joint swelling    Assessment/Plan: 1. Functional deficits which require 3+ hours per day of interdisciplinary therapy in a comprehensive inpatient rehab setting. Physiatrist is providing close team supervision and 24 hour management of active medical problems listed below. Physiatrist and rehab team continue to assess barriers to discharge/monitor patient progress toward functional and medical goals  Care Tool:  Bathing    Body parts bathed by patient: Right arm, Left upper leg, Left arm, Right lower leg, Chest, Left lower leg, Abdomen, Face, Front perineal area, Buttocks, Right upper leg  Bathing assist Assist Level: Supervision/Verbal cueing     Upper Body Dressing/Undressing Upper body dressing   What is the patient wearing?: Pull over shirt    Upper body assist Assist Level: Supervision/Verbal cueing    Lower Body Dressing/Undressing Lower body dressing      What is the patient wearing?: Underwear/pull up, Pants     Lower body assist Assist for lower body dressing: Contact Guard/Touching assist     Toileting Toileting    Toileting assist Assist for toileting: Minimal Assistance - Patient > 75%     Transfers Chair/bed  transfer  Transfers assist     Chair/bed transfer assist level: Contact Guard/Touching assist     Locomotion Ambulation   Ambulation assist      Assist level: Contact Guard/Touching assist Assistive device: Walker-rolling Max distance: 100 feet   Walk 10 feet activity   Assist     Assist level: Contact Guard/Touching assist Assistive device: Walker-rolling   Walk 50 feet activity   Assist Walk 50 feet with 2 turns activity did not occur: Safety/medical concerns  Assist level: Contact Guard/Touching assist Assistive device: Walker-rolling    Walk 150 feet activity   Assist Walk 150 feet activity did not occur: Safety/medical concerns (fatigue/SOB)         Walk 10 feet on uneven surface  activity   Assist Walk 10 feet on uneven surfaces activity did not occur: Safety/medical concerns         Wheelchair     Assist Is the patient using a wheelchair?: Yes Type of Wheelchair: Manual    Wheelchair assist level: Dependent - Patient 0% Max wheelchair distance: 15 ft    Wheelchair 50 feet with 2 turns activity    Assist        Assist Level: Dependent - Patient 0%   Wheelchair 150 feet activity     Assist      Assist Level: Dependent - Patient 0%   Blood pressure 115/60, pulse 78, temperature 98 F (36.7 C), resp. rate 16, height 5' 10 (1.778 m), weight 76.6 kg, SpO2 96%.  Medical Problem List and Plan: 1. 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  Antithrombotics: -DVT/anticoagulation:  Mechanical: Antiembolism stockings, thigh (TED hose) Bilateral lower extremities             -antiplatelet therapy: Aspirin  81 mg daily and  Plavix  75 mg daily  3. Pain Management: Tylenol  as needed  4. Mood/Behavior/Sleep: Prozac  20 mg daily.             -antipsychotic agents: N/A  -melatonin prn  5. Neuropsych/cognition: This patient is capable of making decisions on his own behalf.  6. Skin/Wound Care: Routine skin checks  7. Fluids/Electrolytes/Nutrition: Routine and analysis with follow-up chemistries  8.  Symptomatic left carotid artery stenosis.  Status post stenting/angioplasty 04/21/2024 per Dr. Gretta  9.  Hypotension: continue ProAmatine  10 mg 3 times daily.  Monitor with increased mobility  -12/25-28 BPs remain stable, continue to monitor  Vitals:   05/02/24 0453 05/02/24 1139  05/02/24 1346 05/02/24 2002  BP: (!) 100/52 108/64 131/70 129/85   05/03/24 0541 05/03/24 1504 05/03/24 1959 05/04/24 0610  BP: 101/60 132/71 117/69 118/68   05/04/24 1156 05/04/24 1950 05/04/24 2130 05/05/24 0318  BP: 129/77 (!) 161/73 128/65 115/60    10.  Diabetes mellitus.  Hemoglobin A1c 5.6.  Currently on SSI.  Prior to admission patient on Glucophage  500 mg daily.  Resume as needed CBGs ok if they start climbing above 180 will restart metformin   -12/29 controlled   CBG (last 3)  Recent Labs    05/04/24 1656 05/04/24 2047 05/05/24 0537  GLUCAP 141* 153* 142*    11.  Hyperlipidemia: continue Lipitor  80mg  daily  12.  History of CAD/PCI/CABG/SVT.  Follow-up [name] outpatient  13.  Pancytopenia.  Heme oncology Dr.Pasam outpatient.  Follow-up CBC -No heparin  for DVT prophyllaxis, monitor for bleeding on DAPT transfuse if PLT<20K or Hgb <7.0 -12/25 labs holding today. No action required  -continue to monitor serially -05/04/24 labs from yesterday stable/slightly better.    Latest Ref Rng & Units 05/05/2024    5:22 AM 05/03/2024    2:31 PM 05/01/2024    5:52 AM  CBC  WBC 4.0 - 10.5 K/uL 3.1  3.1  2.4   Hemoglobin 13.0 - 17.0 g/dL 8.9  9.2  8.2   Hematocrit 39.0 - 52.0 % 26.0  26.9  23.7   Platelets 150 -  400 K/uL 61  58  51     14.  Cirrhosis of liver.  Imaging suggestive of cirrhosis.  Bili mildly elevated as well as INR 1.3.  Follow-up outpatient  15.  Urinary retention.  In-N-Out catheterization 12/13 x 1.  Placed on Flomax . Messaged nursing to discuss her PVRs. -04/27/24 flomax  d/c'd yesterday, no PVRs documented, will discuss with nursing-- ordered PVRs -12/25 PVRs variable. Had to be cathed once yesterday for pvr of 350.   -continue PVR's for now -05/03/24 only a few PVRs documented, all low, no caths; spiked fever last night, checking labs/urine/cxr as below. -05/04/24 labs yesterday stable/ok, but U/A looking iffy with cloudy urine, trace leuks, 21-50 WBC though no bacteria-- however pt not feeling well today and having incontinence, highly suspect UTI-- STARTING Keflex  500mg  TID x7d, UCx pending.  UCx prelim Proteus mirabilis , await sens  16.  Constipation: continue Colace 100 mg daily -05/04/24 LBM this AM.   17. Fever 12/26: -05/03/24 pt with 100.7 temp last night, not reported to provider; room is very warm-- getting CBC, CMP, CXR, U/A, hold off on empiric abx just yet-- let w/up guide tx.  -05/04/24 no further fevers, CBC stable, CMP with mild transaminitis, CXR neg, U/A iffy as mentioned above, suspect UTI, start Keflex  as above. UCx shows + proteus  afebrile since 12/26  18.  Pre renal azotemia - IVF at bedtime recheck BMP prior to discharge   LOS: 11 days A FACE TO FACE EVALUATION WAS PERFORMED  Christopher Roberts 05/05/2024, 8:21 AM     "

## 2024-05-05 NOTE — Progress Notes (Signed)
 Speech Language Pathology Daily Session Note  Patient Details  Name: ERHARD SENSKE MRN: [id number] Date of Birth: 1945-07-02  Today's Date: 05/05/2024 SLP Individual Time: 0800-0857 SLP Individual Time Calculation (min): 57 min  Short Term Goals: Week 2: SLP Short Term Goal 1 (Week 2): STGs = LTGs d/t ELOS  Skilled Therapeutic Interventions: SLP conducted skilled therapy session targeting cognitive goals. Upon SLP entry, patient in bed attempting to reach urinal, requesting assistance. SLP repositioned patient into optimal position and provided patient with urinal. Patient attempted to void but was unsuccessful. Patient then requesting to consume breakfast, requiring min assist for functional problem solving and safety awareness. Provided repositioning into upright with patient benefiting from min cues for sequencing. During meal, patient min assist for awareness and supervision for sustained attention in modified minimally distracting environment. SLP facilitated pattern replication task where patient benefited from min cues to sustain attention and min cues for error awareness. During task, patient was distracted by urge to void due to bladder infection, attempted to void again but continues to be unsuccessful. Alerted nursing. Throughout session during functional conversation with staff and MD, patient required min assist for awareness and working memory. Patient was left in room with call bell in reach and alarm set. SLP will continue to target goals per plan of care.        Pain  None endorsed  Therapy/Group: Individual Therapy  Lylla Eifler, M.A., CCC-SLP  Julie-Ann Vanmaanen A Kelechi Orgeron 05/05/2024, 9:09 AM

## 2024-05-05 NOTE — Progress Notes (Signed)
 Physical Therapy Session Note  Patient Details  Name: Christopher Roberts MRN: [id number] Date of Birth: 09/23/1945  Today's Date: 05/05/2024 PT Individual Time: 0903-0959 PT Individual Time Calculation (min): 56 min   Short Term Goals: Week 2:  PT Short Term Goal 1 (Week 2): STG = LTG 2/2 LOS  Skilled Therapeutic Interventions/Progress Updates:      Pt supine in bed upon arrival. Pt agreeable to therapy. Pt denies any pain.   Pt endorsing increased frequency of urination with difficulty controlling it., Pt incontinent of bowel and bladder with bed mobility. Pt performed rolling B with supervision while PT performed pericare with total A. Changed brief with total A. Pt attempted to sit EOB and further incontient of bowel and bladder. Notified nurse. Pt performed stand pivot transfer bed to [facility] with RW and CGA with increased time 2/2 dizziness. Vitals assessed while seated on BSC BP 116/89 HR 92   Pt donned paper shirt while seated on BSC with min A. Paper pants with mod A.   Pt stood with RW and CGA while performing pericare and managemnert of pants and brief with total A.   Assessed orthostatics: pt wearing knee high ted hose. Notified MD - MD ordering abdominal binder. PT donned B LE ace wrap on top of ted hose  Seated BP 112/60 HR 94  Standing BP 80/45 HR 92 - denies dizziness/lightheadedness but endorses fatigue  Reassessed in sitting: BP 95/60 HR 92   Pt required prolonged seated rest breaks throughout session, and pt only able to tolerate short periods of standing <1 min prior to needing seated rest break 2/2 fatigue/SOB/dizziness.   Pt agreeable to sit up in [facility] between PT and OT session. Pt seated in [facility] with all needs within reach and chair alarm on.      Therapy Documentation Precautions:  Precautions Precautions: Fall Recall of Precautions/Restrictions: Impaired Precaution/Restrictions Comments: RLE weakness, RUE weakness Restrictions Weight Bearing Restrictions Per  Provider Order: No   Therapy/Group: Individual Therapy  [facility] Doreene Orris, , DPT  05/05/2024, 7:48 AM

## 2024-05-06 ENCOUNTER — Other Ambulatory Visit (HOSPITAL_COMMUNITY): Payer: Self-pay

## 2024-05-06 ENCOUNTER — Other Ambulatory Visit: Payer: Self-pay

## 2024-05-06 DIAGNOSIS — R4701 Aphasia: Secondary | ICD-10-CM

## 2024-05-06 DIAGNOSIS — I6522 Occlusion and stenosis of left carotid artery: Secondary | ICD-10-CM

## 2024-05-06 LAB — GLUCOSE, CAPILLARY
Glucose-Capillary: 140 mg/dL — ABNORMAL HIGH (ref 70–99)
Glucose-Capillary: 182 mg/dL — ABNORMAL HIGH (ref 70–99)
Glucose-Capillary: 155 mg/dL — ABNORMAL HIGH (ref 70–99)
Glucose-Capillary: 227 mg/dL — ABNORMAL HIGH (ref 70–99)

## 2024-05-06 MED ORDER — TAMSULOSIN HCL 0.4 MG PO CAPS
0.4000 mg | ORAL_CAPSULE | Freq: Every day | ORAL | Status: DC
  Administered 2024-05-06 – 2024-05-08 (×3): 0.4 mg via ORAL
  Filled 2024-05-06 (×3): qty 1

## 2024-05-06 MED ORDER — TRAZODONE HCL 50 MG PO TABS
50.0000 mg | ORAL_TABLET | Freq: Every evening | ORAL | 0 refills | Status: AC | PRN
  Filled 2024-05-06: qty 30, 30d supply, fill #0

## 2024-05-06 MED ORDER — CEPHALEXIN 500 MG PO CAPS
500.0000 mg | ORAL_CAPSULE | Freq: Three times a day (TID) | ORAL | 0 refills | Status: DC
  Filled 2024-05-06: qty 9, 3d supply, fill #0

## 2024-05-06 MED ORDER — MIDODRINE HCL 10 MG PO TABS
10.0000 mg | ORAL_TABLET | Freq: Three times a day (TID) | ORAL | 0 refills | Status: DC
  Filled 2024-05-06: qty 90, 30d supply, fill #0

## 2024-05-06 MED ORDER — FLUOXETINE HCL 20 MG PO CAPS
20.0000 mg | ORAL_CAPSULE | Freq: Every day | ORAL | 0 refills | Status: AC
  Filled 2024-05-06: qty 30, 30d supply, fill #0

## 2024-05-06 MED ORDER — NITROGLYCERIN 0.4 MG SL SUBL
0.4000 mg | SUBLINGUAL_TABLET | SUBLINGUAL | 0 refills | Status: AC | PRN
  Filled 2024-05-06: qty 25, 5d supply, fill #0

## 2024-05-06 MED ORDER — METFORMIN HCL ER 500 MG PO TB24
500.0000 mg | ORAL_TABLET | Freq: Every day | ORAL | 0 refills | Status: AC
  Filled 2024-05-06: qty 30, 30d supply, fill #0

## 2024-05-06 MED ORDER — CLOPIDOGREL BISULFATE 75 MG PO TABS
75.0000 mg | ORAL_TABLET | Freq: Every day | ORAL | 0 refills | Status: AC
  Filled 2024-05-06: qty 30, 30d supply, fill #0

## 2024-05-06 MED ORDER — MECLIZINE HCL 25 MG PO TABS
25.0000 mg | ORAL_TABLET | Freq: Three times a day (TID) | ORAL | 0 refills | Status: AC | PRN
  Filled 2024-05-06: qty 30, 10d supply, fill #0

## 2024-05-06 MED ORDER — TAMSULOSIN HCL 0.4 MG PO CAPS
0.4000 mg | ORAL_CAPSULE | Freq: Every day | ORAL | 0 refills | Status: DC
  Filled 2024-05-06: qty 30, 30d supply, fill #0

## 2024-05-06 MED ORDER — ATORVASTATIN CALCIUM 80 MG PO TABS
80.0000 mg | ORAL_TABLET | Freq: Every day | ORAL | 0 refills | Status: AC
  Filled 2024-05-06: qty 30, 30d supply, fill #0

## 2024-05-06 NOTE — Progress Notes (Signed)
 "                                                        PROGRESS NOTE   Subjective/Complaints:  Started to retain urine as of 12/29, off flomax  since 12/20 due to hypotension  Drinking better and received IVF last noc   ROS: as per HPI. Denies CP, SOB, abd pain, N/V/D/C, or any other complaints at this time.    Objective:   No results found.  Recent Labs    05/03/24 1431 05/05/24 0522  WBC 3.1* 3.1*  HGB 9.2* 8.9*  HCT 26.9* 26.0*  PLT 58* 61*   Recent Labs    05/03/24 1431 05/05/24 0522  NA 138 137  K 3.9 4.0  CL 106 106  CO2 22 20*  GLUCOSE 125* 141*  BUN 37* 46*  CREATININE 1.20 1.27*  CALCIUM  9.1 8.7*    Intake/Output Summary (Last 24 hours) at 05/06/2024 0721 Last data filed at 05/06/2024 0600 Gross per 24 hour  Intake 360 ml  Output 1120 ml  Net -760 ml        Physical Exam: Vital Signs Blood pressure 131/69, pulse 72, temperature 98 F (36.7 C), resp. rate 18, height 5' 10 (1.778 m), weight 76.6 kg, SpO2 98%.   General: No acute distress, sitting up in w/c, looks a little tired.  Mood and affect are appropriate, a little flat Heart: Regular rate and rhythm no rubs murmurs or extra sounds Lungs: Clear to auscultation, breathing unlabored, no rales or wheezes Abdomen: Positive bowel sounds, soft nontender to palpation, nondistended Extremities: No clubbing, cyanosis, or edema Skin: No evidence of breakdown, no evidence of rash to exposed surfaces, scattered bruises, L cartoid surgical incision remains c/d/I  PRIOR EXAMS: Neurologic: Cranial nerves II through XII intact, motor strength is 5/5 in left UE/LE and 5/5 right deltoid, bicep, tricep, grip, 4/5 hip flexor, knee extensors, ankle dorsiflexor and plantar flexor Sensory exam normal sensation to light touch  in bilateral upper and left lower lower extremities Reduced LT RLE Oriented to person and place, month, day Cerebellar exam mild dysmetria Right finger nose finger  Musculoskeletal:  Full range of motion in all 4 extremities. No joint swelling    Assessment/Plan: 1. Functional deficits which require 3+ hours per day of interdisciplinary therapy in a comprehensive inpatient rehab setting. Physiatrist is providing close team supervision and 24 hour management of active medical problems listed below. Physiatrist and rehab team continue to assess barriers to discharge/monitor patient progress toward functional and medical goals  Care Tool:  Bathing    Body parts bathed by patient: Right arm, Left upper leg, Left arm, Right lower leg, Chest, Left lower leg, Abdomen, Face, Front perineal area, Buttocks, Right upper leg         Bathing assist Assist Level: Supervision/Verbal cueing     Upper Body Dressing/Undressing Upper body dressing   What is the patient wearing?: Pull over shirt    Upper body assist Assist Level: Supervision/Verbal cueing    Lower Body Dressing/Undressing Lower body dressing      What is the patient wearing?: Underwear/pull up, Pants     Lower body assist Assist for lower body dressing: Contact Guard/Touching assist     Toileting Toileting    Toileting assist Assist for toileting: Minimal Assistance - Patient >  75%     Transfers Chair/bed transfer  Transfers assist     Chair/bed transfer assist level: Contact Guard/Touching assist     Locomotion Ambulation   Ambulation assist      Assist level: Contact Guard/Touching assist Assistive device: Walker-rolling Max distance: 100 feet   Walk 10 feet activity   Assist     Assist level: Contact Guard/Touching assist Assistive device: Walker-rolling   Walk 50 feet activity   Assist Walk 50 feet with 2 turns activity did not occur: Safety/medical concerns  Assist level: Contact Guard/Touching assist Assistive device: Walker-rolling    Walk 150 feet activity   Assist Walk 150 feet activity did not occur: Safety/medical concerns (fatigue/SOB)         Walk  10 feet on uneven surface  activity   Assist Walk 10 feet on uneven surfaces activity did not occur: Safety/medical concerns         Wheelchair     Assist Is the patient using a wheelchair?: Yes Type of Wheelchair: Manual    Wheelchair assist level: Dependent - Patient 0% Max wheelchair distance: 15 ft    Wheelchair 50 feet with 2 turns activity    Assist        Assist Level: Dependent - Patient 0%   Wheelchair 150 feet activity     Assist      Assist Level: Dependent - Patient 0%   Blood pressure 131/69, pulse 72, temperature 98 F (36.7 C), resp. rate 18, height 5' 10 (1.778 m), weight 76.6 kg, SpO2 98%.  Medical Problem List and Plan: 1. 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  Antithrombotics: -DVT/anticoagulation:  Mechanical: Antiembolism stockings, thigh (TED hose) Bilateral lower extremities             -antiplatelet therapy: Aspirin  81 mg daily and Plavix  75 mg daily  3. Pain Management: Tylenol  as needed  4. Mood/Behavior/Sleep: Prozac  20 mg daily.             -antipsychotic agents: N/A  -melatonin prn  5. Neuropsych/cognition: This patient is capable of making decisions on his own behalf.  6. Skin/Wound Care: Routine skin checks  7. Fluids/Electrolytes/Nutrition: Routine and analysis with follow-up chemistries  8.  Symptomatic left carotid artery stenosis.  Status post stenting/angioplasty 04/21/2024 per Dr. Gretta  9.  Hypotension: continue ProAmatine  10 mg 3 times daily.  Monitor with increased mobility  -12/25-28 BPs remain stable, continue to monitor  Vitals:   05/02/24 2002  05/03/24 0541 05/03/24 1504 05/03/24 1959  BP: 129/85 101/60 132/71 117/69   05/04/24 0610 05/04/24 1156 05/04/24 1950 05/04/24 2130  BP: 118/68 129/77 (!) 161/73 128/65   05/05/24 0318 05/05/24 1525 05/05/24 2125 05/06/24 0622  BP: 115/60 (!) 152/73 120/87 131/69    10.  Diabetes mellitus.  Hemoglobin A1c 5.6.  Currently on SSI.  Prior to admission patient on Glucophage  500 mg daily.  Resume as needed CBGs ok if they start climbing above 180 will  restart metformin   -12/29 controlled   CBG (last 3)  Recent Labs    05/05/24 1644 05/05/24 2126 05/06/24 0624  GLUCAP 158* 217* 140*    11.  Hyperlipidemia: continue Lipitor  80mg  daily  12.  History of CAD/PCI/CABG/SVT.  Follow-up [name] outpatient  13.  Pancytopenia.  Heme oncology Dr.Pasam outpatient.  Follow-up CBC -No heparin  for DVT prophyllaxis, monitor for bleeding on DAPT transfuse if PLT<20K or Hgb <7.0 -12/25 labs holding today. No action required  -continue to monitor serially -05/04/24 labs from yesterday stable/slightly better.    Latest Ref Rng & Units 05/05/2024    5:22 AM 05/03/2024    2:31 PM 05/01/2024    5:52 AM  CBC  WBC 4.0 - 10.5 K/uL 3.1  3.1  2.4   Hemoglobin 13.0 - 17.0 g/dL 8.9  9.2  8.2   Hematocrit 39.0 - 52.0 % 26.0  26.9  23.7   Platelets 150 - 400 K/uL 61  58  51     14.  Cirrhosis of liver.  Imaging suggestive of cirrhosis.  Bili mildly elevated as well as INR 1.3.  Follow-up outpatient  15.  Urinary retention.  In-N-Out catheterization 12/13 x 1.  Placed on Flomax . Messaged nursing to discuss her PVRs. -04/27/24 flomax  d/c'd yesterday, no PVRs documented, will discuss with nursing-- ordered PVRs -12/25 PVRs variable. Had to be cathed once yesterday for pvr of 350.   -continue PVR's for now -05/03/24 only a few PVRs documented, all low, no caths; spiked fever last night, checking labs/urine/cxr as below. -05/04/24 labs yesterday stable/ok, but U/A looking iffy with cloudy urine, trace  leuks, 21-50 WBC though no bacteria-- however pt not feeling well today and having incontinence, highly suspect UTI-- STARTING Keflex  500mg  TID x7d, UCx  Proteus mirabilis , >100K S to Keflex   Restart flomax  as PVRs are now up , no anticholinergic meds identified   16.  Constipation: continue Colace 100 mg daily -05/04/24 LBM this AM.   17. Fever 12/26: -05/03/24 pt with 100.7 temp last night, not reported to provider; room is very warm-- getting CBC, CMP, CXR, U/A, hold off on empiric abx just yet-- let w/up guide tx.  -05/04/24 no further fevers, CBC stable, CMP with mild transaminitis, CXR neg, U/A iffy as mentioned above, suspect UTI, start Keflex  as above. UCx shows + proteus  afebrile since 12/26  18.  Pre renal azotemia - IVF at bedtime recheck BMP in am   LOS: 12 days A FACE TO FACE EVALUATION WAS PERFORMED  Prentice FORBES Compton 05/06/2024, 7:21 AM     "

## 2024-05-06 NOTE — Progress Notes (Signed)
 Occupational Therapy Session Note  Patient Details  Name: VOLNEY REIERSON MRN: [id number] Date of Birth: [birth year]  Today's Date: 05/06/2024 OT Individual Time: [phone] OT Individual Time Calculation (min): 75 min    Short Term Goals: Week 2:  OT Short Term Goal 1 (Week 2): STG =LTG d.t ELOS  Skilled Therapeutic Interventions/Progress Updates:  Skilled OT session completed to address discharge planning, and functional transfers. Pt received supine in bed with wife present and RN present, agreeable to participate in therapy. Pt reports no pain.   OT provided education on donning/doffing TED hose and purpose for BP mgmt. OT also instructed pt and wife on reading BP and how to recognize drops in systolic and diastolic pressure. Pt and wife verbalized understanding. Wife returned demo of donning/doffing TED hose with supervision. OT also provided education on doffing at night. OT provided education to pt and wife on [facility] assembly, set-up in home environment (over toilet, at bedside, or in shower), and transfer assistance required when completing toileting. Wife verbalized and displayed understanding with hands on A during transfer. Pt requesting to void. Supine>EOB CGA with bed rails. Pt reporting dizziness, seated EOB until symptoms subside, BP monitored 141/76. Pt reporting symptoms manageable. EOB>toilet with RW CGA, OT managing IV line and wife providing A at gait belt. Pt completed 2/3 steps of toileting hygiene with Min A to manage incontinence brief. Toilet>WC CGA with RW. Pt unable to tolerate sitting in chair d/t dizziness returning, WC>EOB stand pivot with supervision to manage IV line. Pt supine in bed with bed alarm on and all needs within reach.   Therapy Documentation Precautions:  Precautions Precautions: Fall Recall of Precautions/Restrictions: Impaired Precaution/Restrictions Comments: RLE weakness, RUE weakness Restrictions Weight Bearing Restrictions Per Provider Order:  No  Therapy/Group: Individual Therapy  Tristan Proto Woods-Chance, MS, OTR/L 05/06/2024, 8:01 AM

## 2024-05-06 NOTE — Consult Note (Signed)
 Neuropsychological Consultation Comprehensive Inpatient Rehab   Patient:   Christopher Roberts   DOB:   03/29/1946  MR Number:  [id number]  Location:  [facility] [address] [place] [address] Dept: [phone] Loc: [phone]           Date of Service:   05/06/2024  Start Time:   1 PM End Time:   2 PM  Provider/Observer:  Norleen Asa, Psy.D.       Clinical Neuropsychologist       Billing Code/Service: [phone]  Reason for Service:    Christopher Roberts is a 78 year old right-handed male. Assessment Date: 05/06/2024. Referred for neuropsychological assessment in the context of inpatient rehabilitation following an acute stroke. Assessment is to establish a cognitive baseline, provide psychoeducation, and guide further management.    Presenting Concerns:  Reports right leg weakness, described as a lost feeling. Wife reports previous word slurring which has improved. Patient reports difficulty urinating with feelings of urinary urgency but inability to void, requiring catheterization. Wife reports significant frustration and confusion regarding scheduling of family education. Expressed concerns about the plan for a future procedure on the contralateral carotid artery and discharge plan, particularly given recent illness (flu, pneumonia) in the family home.    Relevant Clinical History:  - Neurological: Presented 04/15/2024 after a fall. 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 CTA showed near occlusive stenosis (>=90%) of the proximal right ICA and extensive calcified plaque with ~60-70% stenosis of the proximal left ICA. Moderate to severe stenosis of the P2 segment of the left PCA and moderate stenosis of the P2 segment of the right PCA. Underwent transcatheter placement  of left cervical carotid artery stent angioplasty on 04/21/2024.  - Psychiatric: No history provided.   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 Tobacco use. Current UTI. One episode of urinary retention (1L) on 04/19/2024, placed on Flomax . Hospital course complicated by hypotension. Mildly elevated bilirubin with imaging suggestive of mild cirrhosis, plan for outpatient workup. Medications: Aspirin  81 mg daily, Plavix  75 mg daily. Currently on ProAmatine , beta-blocker held.    Functional Capacity:  Pre-morbidly independent with cane/rolling walker. Wife and granddaughter available for assistance. Admitted to rehab due to decreased functional mobility post-stroke.    Mental State Examination:  - Appearance and Behaviour: Appeared to be in some discomfort/pain, which he attributed to urinary retention. No other significant behavioural observations.  - Speech: Rate, volume, and spontaneity appeared within normal limits. Wife reported prior word-slurring that has since improved. Patient denied current speech changes. Mild dysarthria or aphasia noted during interview.  - Mood and Affect: Patient's wife expressed frustration and concern. Patient appeared concerned about his physical symptoms but mood was otherwise stable.  - Thought Process/Content: Thought process was coherent and goal-directed. Content focused on current medical issues (urinary retention, leg weakness) and confusion about care planning.  - Cognition: Oriented. Attention appeared grossly intact. Able to recall recent events (e.g., catheterization this morning).  - Insight and Judgement: Appears to have adequate insight into right leg weakness. Insight into the nature and extent of cognitive changes is still developing. Showed appropriate concern regarding his medical status.    Clinical  Impressions:  Christopher Roberts is a 78 year old male  in the early post-stroke recovery period, presenting for initial neuropsychological consultation. Primary stroke-related sequelae include right lower extremity weakness and a history of resolving dysarthria, consistent with a left frontal ischemic event. Wife primarily demonstrated significant distress related to miscommunication regarding care planning and concern about medical complications (UTI) and discharge timing. Engagement was good. Recovery trajectory appears positive to date, with resolving speech deficits. Full neuropsychological battery will be required to comprehensively assess cognitive domains, particularly executive functions, attention, and processing speed, given the bilateral and diffuse nature of cerebrovascular disease.    Recommendations and Next Steps:  - Provide psychoeducation to patient and wife regarding stroke recovery, the role of rehabilitation, and expected prognosis.  - Liaised with PA Joie) regarding patient's acute urinary distress and need for intervention.  - Will follow up regarding scheduling for family education.  - Re-conferencing scheduled for tomorrow to reassess progress and confirm discharge plan.        Electronically Signed   _______________________ Norleen Asa, Psy.D. Clinical Neuropsychologist

## 2024-05-06 NOTE — Progress Notes (Signed)
 Inpatient Rehabilitation Discharge Medication Review by a Pharmacist  A complete drug regimen review was completed for this patient to identify any potential clinically significant medication issues.  High Risk Drug Classes Is patient taking? Indication by Medication  Antipsychotic No   Anticoagulant No   Antibiotic Yes Keflex  x 7 days: UTI (through January 5th)  Opioid No   Antiplatelet Yes Aspirin  81/Plavix : CVA ppx  Hypoglycemics/insulin  Yes Metformin : DM  Vasoactive Medication Yes Midodrine : hypotension Flomax : urinary retention  Chemotherapy No   Other Yes Tylenol : pain Lipitor : HLD Nitroglycerin : chest pain Fluoxetine : mood Trazodone: sleep Meclizine : vertigo      Type of Medication Issue Identified Description of Issue Recommendation(s)  Drug Interaction(s) (clinically significant)     Duplicate Therapy     Allergy     No Medication Administration End Date     Incorrect Dose     Additional Drug Therapy Needed     Significant med changes from prior encounter (inform family/care partners about these prior to discharge).  Restart or discontinue as appropriate. Communicate medication changes with patient/family at discharge  Other       Clinically significant medication issues were identified that warrant physician communication and completion of prescribed/recommended actions by midnight of the next day:  No   Time spent performing this drug regimen review (minutes): 30   Thank you for allowing pharmacy to be a part of this patients care.   Warda Mcqueary C Aneyah Lortz, PharmD 05/06/2024 9:15 AM    **Pharmacist phone directory can be found on [email or web address] listed under [facility]**

## 2024-05-06 NOTE — Progress Notes (Signed)
 Physical Therapy Session Note  Patient Details  Name: Christopher Roberts MRN: [id number] Date of Birth: 1945/08/15  Today's Date: 05/06/2024 PT Individual Time: 1050-1152 PT Individual Time Calculation (min): 62 min   Short Term Goals: Week 2:  PT Short Term Goal 1 (Week 2): STG = LTG 2/2 LOS  Skilled Therapeutic Interventions/Progress Updates: Patient sitting in WC on entrance to room. Patient alert and agreeable to PT session.   Patient initially reported no pain, and then later presented with increase in pain in penis region (pt initially stated he thought he needed to have BM after standing in RW in main gym to initiate ambulatory intervention - supervision to stand). Pt transported back to room and transferred to toilet from [facility] in RW with CGA for safety and pt had blood present in brief. Nsg notified and found blood to have come from penis. Pt stated this is the first time this has happened during inpatient stay. Attending physicians notified. Pt cued to perform self care of posterior peri-area but no BM noted to occur (increase in blood in toilet present). New brief donned and PTA confirmed with attending PA that it was okay to continue with PT. Pt transported back to main gym in [facility] and performed stair navigation with BHR's and cue to lead with L LE with step to pattern. Pt CGA for safety and also cued to increase upright standing posture. Pt required retro-step back down steps (6 steps) due to reports of increase in lightheadedness. Pt cued to elevate B LE's onto 2nd step with pt stating decrease in symptom shortly after. Pt attempted ambulatory intervention to increase endurance. Pt proceeded to take few steps in RW with CGA for safety and PTA towing WC. Pt required return to room in [facility] due to urgent need to void bladder. Pt standing over toilet in RW with inability to void bladder and more blood in brief from penis present. PTA donned new brief with pt standing in RW with CGA. PTA notified  attending PTA and nsg on pt's last presentation at end of session.  PTA discussed with pt about recommending to rehabilitation team to extend pt's d/c date due to pt's poor endurance and increase in lightheadedness/orthostatic which prevent pt from tolerating functional mobility interventions to safely d/c home. Pt understood and agreed.  Patient sitting in WC at end of session with brakes locked, nsg present, belt alarm set, and all needs within reach.      Therapy Documentation Precautions:  Precautions Precautions: Fall Recall of Precautions/Restrictions: Impaired Precaution/Restrictions Comments: RLE weakness, RUE weakness Restrictions Weight Bearing Restrictions Per Provider Order: No  Therapy/Group: Individual Therapy  Mohd. Derflinger PTA 05/06/2024, 12:35 PM

## 2024-05-06 NOTE — Progress Notes (Signed)
 Speech Language Pathology Daily Session Note  Patient Details  Name: Christopher Roberts MRN: [id number] Date of Birth: Jan 10, 1946  Today's Date: 05/06/2024 SLP Individual Time: 0900-0957 SLP Individual Time Calculation (min): 57 min  Short Term Goals: Week 2: SLP Short Term Goal 1 (Week 2): STGs = LTGs d/t ELOS  Skilled Therapeutic Interventions: Skilled therapy session focused on cognitive goals. Upon entrance, patient asleep though easily alerted via tactile and verbal stim. Patient agreeable to tx. SLP targeted cognitive goals through problem solving task. Patient was prompted to utilize hospital signs to navigate to various predetermined locations including the gift shop, cafeteria and main entrance. Patient required minA to complete. Upon return to the room, patient recalled 3/3 locations visited independently. Patient left in [facility] with alarm set and call bell in reach. Continue POC  Pain None reported   Therapy/Group: Individual Therapy  Montae Stager M.A., CCC-SLP 05/06/2024, 7:33 AM

## 2024-05-07 ENCOUNTER — Other Ambulatory Visit (HOSPITAL_COMMUNITY): Payer: Self-pay

## 2024-05-07 LAB — GLUCOSE, CAPILLARY
Glucose-Capillary: 179 mg/dL — ABNORMAL HIGH (ref 70–99)
Glucose-Capillary: 240 mg/dL — ABNORMAL HIGH (ref 70–99)
Glucose-Capillary: 179 mg/dL — ABNORMAL HIGH (ref 70–99)
Glucose-Capillary: 175 mg/dL — ABNORMAL HIGH (ref 70–99)

## 2024-05-07 LAB — TROPONIN T, HIGH SENSITIVITY
Troponin T High Sensitivity: 32 ng/L — ABNORMAL HIGH (ref 0–19)
Troponin T High Sensitivity: 47 ng/L — ABNORMAL HIGH (ref 0–19)
Troponin T High Sensitivity: 64 ng/L — ABNORMAL HIGH (ref 0–19)

## 2024-05-07 MED ORDER — CHLORHEXIDINE GLUCONATE CLOTH 2 % EX PADS
6.0000 | MEDICATED_PAD | Freq: Every day | CUTANEOUS | Status: DC
  Administered 2024-05-07: 6 via TOPICAL

## 2024-05-07 MED ORDER — CHLORHEXIDINE GLUCONATE CLOTH 2 % EX PADS
6.0000 | MEDICATED_PAD | Freq: Two times a day (BID) | CUTANEOUS | Status: DC
  Administered 2024-05-08 – 2024-05-14 (×13): 6 via TOPICAL

## 2024-05-07 MED ORDER — CARVEDILOL 6.25 MG PO TABS
6.2500 mg | ORAL_TABLET | Freq: Two times a day (BID) | ORAL | Status: DC
  Administered 2024-05-07: 6.25 mg via ORAL
  Filled 2024-05-07 (×2): qty 1

## 2024-05-07 NOTE — Progress Notes (Signed)
 Speech Language Pathology Daily Session Note  Patient Details  Name: Christopher Roberts MRN: [id number] Date of Birth: 1946/04/02  Today's Date: 05/07/2024 SLP Individual Time: 0830-0930 SLP Individual Time Calculation (min): 60 min  Short Term Goals: Week 2: SLP Short Term Goal 1 (Week 2): STGs = LTGs d/t ELOS  Skilled Therapeutic Interventions: SLP conducted skilled therapy session targeting cognitive goals. Patient asleep upon SLP arrival but easily rousable and agreeable to all therapy tasks. Patient requests to eat breakfast, thus benefited from setup assist for items, then selectively attended to meal in mildly distracting environment for 15 minutes with supervision.  SLP then facilitated mildly complex pattern replication, sequencing, and problem solving task where patient accurately organized colored pegs given +processing time and mod assist. Patient was left in room with call bell in reach and alarm set. SLP will continue to target goals per plan of care.        Pain Pain Assessment Pain Scale: 0-10 Pain Score: 0-No painNone   Therapy/Group: Individual Therapy  Krystyn Picking, M.A., CCC-SLP  Jacier Gladu A Inette Doubrava 05/07/2024, 10:12 AM

## 2024-05-07 NOTE — Progress Notes (Signed)
 Occupational Therapy Session Note  Patient Details  Name: Christopher Roberts MRN: [id number] Date of Birth: [birth year]  Today's Date: 05/07/2024 OT Individual Time: 1005-1045 OT Individual Time Calculation (min): 40 min    Short Term Goals: Week 1:  OT Short Term Goal 1 (Week 1): patient will complete toileting / toileting transfer CGA with increased attention to the R with min verbal cueing OT Short Term Goal 1 - Progress (Week 1): Met OT Short Term Goal 2 (Week 1): patient will complete grooming standing at sink with SUP to increase dynamic balance OT Short Term Goal 2 - Progress (Week 1): Met OT Short Term Goal 3 (Week 1): patient will complete LB dressing CGA OT Short Term Goal 3 - Progress (Week 1): Met Week 2:  OT Short Term Goal 1 (Week 2): STG =LTG d.t ELOS  Skilled Therapeutic Interventions/Progress Updates:    1:1 Pt will not be d/c tomorrow as originally planned due to medical issues. Pt received in the bed and reports feeling tired and off today. Thigh high TEDS donned in bed instead of knee. Pt came to EOB mod I. Pt able to stand with cues for proper hand placement and ambulated ot BR with labored breathing and reports he is so tired. BP taken in sitting. See below- Brief is soiled with blood. PT reports wanting to go to the bathroom but unable to go-and still being cathed.     05/07/24 1015  Vital Signs  Pulse Rate 95  BP (!) 93/59  BP Location Right Arm  BP Method Automatic  Patient Position (if appropriate) Sitting   After cleaning up and donning new brief. Stood with supervision and BP :  05/07/24 1025  Vital Signs  BP (!) 79/47  BP Location Right Arm  BP Method Automatic  Patient Position (if appropriate) Standing (with TEDS)   Pt reports pain around penis when washing periarea. RN made aware. Pt transferred with CGA back into w/c. Sat at sink and brushed teeth before stand pivoting to get back into bed with supervision. Pt returned to laying down with call  bell.    Therapy Documentation Precautions:  Precautions Precautions: Fall Recall of Precautions/Restrictions: Impaired Precaution/Restrictions Comments: RLE weakness, RUE weakness Restrictions Weight Bearing Restrictions Per Provider Order: No General:   Vital Signs: taken back in bed after session  Therapy Vitals Pulse Rate: 95 BP: 104/60 Patient Position (if appropriate): Lying Pain: Pain Assessment Pain Scale: 0-10 Pain Score: 0-No pain    Therapy/Group: Individual Therapy  Claudene Nest [facility] 05/07/2024, 11:26 AM

## 2024-05-07 NOTE — Progress Notes (Signed)
 Patient c/o of left chest pain to therapist. RN assessed patient, patient able to deep breath which lessen pain;reported 5/10 pain all vitals WDL. Provider notified, no new orders currently.

## 2024-05-07 NOTE — Progress Notes (Signed)
 Orthopedic Tech Progress Note Patient Details:  Christopher Roberts Mar 30, 1946 [id number]  Patient ID: Christopher Roberts, male   DOB: Jun 15, 1945, 78 y.o.   MRN: [id number] Delivered abdominal binder. Ordered by nurse. Adine MARLA Blush 05/07/2024, 10:56 AM

## 2024-05-07 NOTE — Progress Notes (Signed)
 Patient ID: Christopher Roberts, male   DOB: [birth year], 78 y.o.   MRN: [id number]  SW updated [facility] on change in d/c date. .  SW met with pt and pt wife in room to inform on change in d/c date from 1/1 to 1/6 due to medical reasons. Both in agreement. Understands primary SW will follow-up next week if there are any changes.   Graeme Jude, MSW, LCSW Office: [phone] Cell: [phone] Fax: [phone]

## 2024-05-07 NOTE — Progress Notes (Signed)
 Physical Therapy Session Note  Patient Details  Name: Christopher Roberts MRN: [id number] Date of Birth: Jan 25, 1946  Today's Date: 05/07/2024 PT Individual Time: 1117-1200 PT Individual Time Calculation (min): 43 min   Today's Date: 05/07/2024 PT Individual Time: 8554-8472 PT Individual Time Calculation (min): 42 min   Short Term Goals: Week 2:  PT Short Term Goal 1 (Week 2): STG = LTG 2/2 LOS  Skilled Therapeutic Interventions/Progress Updates:     Treatment 1: Pt supine in bed upon arrival. Pt denies pain but endorses significant fatigue. Pt initially refused PT due to fatigue but with PT encouragement pt agreeable to bed-level exercises. Session emphasized functional LE ROM strengthening and endurance for carryover into transfers, ambulation, and stair negotiation. Pt performed 3x10 of each of the following B LE strengthening exercises: ankle pumps, hip/knee flexion, and long axis hip abduction. Prior to last exercise, pt reported L-sided chest pain - nursing notified and came to room. PT assessed vitals - BP 122/71, HR 97, SpO2 96%. Nursing notified PA who came to speak with wife during session. Pt agreeable to proceed with session to complete last exercise. With encouragement from wife, pt sat to EOB with min A and ++ time. PT donned abdominal binder as it was delivered to room. Pt then returned to supine with min A, bed alarm set, and all needs in reach at end of session.  Treatment 2: Pt supine in bed with cardiologist upon arrival. Pt denies pain but endorses significant fatigue. Pt initially refused PT due to fatigue but with PT encouragement pt agreeable to EOB exercises. Session emphasized functional LE ROM strengthening and endurance for carryover into transfers, ambulation, and stair negotiation. Pt required min A to bring trunk to upright position for supine to sitting EOB. Denies dizziness. Pt balanced EOB with and without UE support while participating in seated B LE strengthening  exercises. Pt performed 3x10 of hip flexion (marching) and LAQ. Rest breaks required between sets and occasionally between reps due to fatigue/SOB. Pt instructed in and performed pursed-lip breathing technique which helped reduce SOB. PT assessed pt's vitals in seated position: BP 109/61, HR 91%, SpO2 97%. PT able to encourage pt to stand to RW. Pt denies dizziness upon standing and quickly reported urge to void. Pt amb to bathroom and sat to [facility] over toilet using RW with CGA. Pt reported intermittent return of L-sided chest pain while seated on [facility] - notified nursing. NT took over toileting at end of session.  Therapy Documentation Precautions:  Precautions Precautions: Fall Recall of Precautions/Restrictions: Impaired Precaution/Restrictions Comments: RLE weakness, RUE weakness Restrictions Weight Bearing Restrictions Per Provider Order: No  Therapy/Group: Individual Therapy  [name], PT, DPT 05/07/2024, 11:21 AM

## 2024-05-07 NOTE — Patient Care Conference (Signed)
 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 Prentice Compton Social Worker Present: Graeme Jude, LCSW Nurse Present: Barnie Ronde, RN PT Present: Sherlean Perks, PT OT Present: Recardo Maxwell, OT SLP Present: Joane Fuss, SLP     Current Status/Progress Goal Weekly Team Focus  Bowel/Bladder   Pt is continent of B&B   Pt will remain continent of B&B   Follow bladder program providing pt assistance with elimination needs Q 4-6 hrs to maintain continent status    Swallow/Nutrition/ Hydration               ADL's   CGA toilet transfers, min A toileting and LB self care,  limited by dizziness   MOD I UB ADL, SUP LB ADL and transfers   ADL training, standing balance and tolerance,  education    Mobility   bed mobility: supervision, sit to stand/stand pivot transfer RW and S/CGA, gait x100 feet with RW + S/CGA, pt limited to 4 steps at a time 2/2 endurance deficits pt limited by UTI, urinary/bowel incontinence, orthostatic hypotension with dizziness   supervision overall  D/C scheduled for 1/1 however pt would benefit from extended LOS barriers: endrance, hemibody weakness, 12 steps to  access bedroom/bathroom - pt currently requriing seated rest break at top of stairs 2/2 endurance.    Communication   supervision-minA   supervision   circumlocution strategies, conversational scenarios, abstract word finding    Safety/Cognition/ Behavioral Observations  supervision-minA   supervisionA   problem solving, memory, attention    Pain   Pt denies pain or discomfort   Pt will remain pain free. Any c/o of pain wil be less than 3 on the pain scale   Assess pt for pain Q shift, medicates for pain as needed to ensure pain is less than 3 on the pain scale    Skin   Pt skin is intact wit h no alterations in skin integrity   Pt skin will remain intact with no alterations in skin integrity  Pt will be turned and repositioned Q 2hrs for comfort, positioning and skin integrity      Discharge Planning:  Attempting to get wife in to go through therapies with husband, they are raising five great granddaughter's. Have ordered 3 in 1 and home health arranged via [facility]. Fam edu on 12/29 with pt wife 14m-4pm.   Team Discussion: Patient admitted post ICA CVA with history of pancytopenia. Functional progress limited by poor proprioception of right LE and slow processing with fatigue and dizziness and orthostasis with chronic thrombocytopenia. Recent issues with urinary retention, incontinence and hematuria off Flomax ; oral abx. added per MD for treatment of UTI.  Patient on target to meet rehab goals: yes, currently needs  CGA - min assist overall for ADLs and toileting.  Needs CGA for stand pivot transfers and 4 steps.  Needs CGA for ambulation up to 100' using a RW.  Needs supervision - min assist for communication; abstract word finding; working on memory and attention with problem solving.   *See Care Plan and progress notes for long and short-term goals.   Revisions to Treatment Plan:  IVF   Teaching Needs: Safety, medications, transfers, toileting, etc.   Current Barriers  to Discharge: Decreased caregiver support  Possible Resolutions to Barriers: Family education 12/20 and 12/31 HH follow up services DME: [facility]     Medical Summary Current Status: Worsening of urinary retention off Flomax , Flomax  was held due to low BPs, has been volume depleted requiring IV fluids  Barriers to Discharge: Medical stability   Possible Resolutions to [employer] Focus: Resume Flomax , monitor hematuria and hemoglobin in light of thrombocytopenia, monitor blood pressures and continue IV hydration, will need to prolong inpatient stay   Continued Need for Acute Rehabilitation Level of Care: The patient requires daily medical management by a physician with specialized training in physical medicine and rehabilitation for the following reasons: Direction of a multidisciplinary physical rehabilitation program to maximize functional independence : Yes Medical management of patient stability for increased activity during participation in an intensive rehabilitation regime.: Yes Analysis of laboratory values and/or radiology reports with any subsequent need for medication adjustment and/or medical intervention. : Yes   I attest that I was present, lead the team conference, and concur with the assessment and plan of the team.   Fredericka Sober B 05/07/2024, 1:43 PM

## 2024-05-07 NOTE — Plan of Care (Signed)
" °  Problem: Consults Goal: RH STROKE PATIENT EDUCATION Description: See Patient Education module for education specifics  Outcome: Progressing Goal: Diabetes Guidelines if Diabetic/Glucose > 140 Description: If diabetic or lab glucose is > 140 mg/dl - Initiate Diabetes/Hyperglycemia Guidelines & Document Interventions  Outcome: Progressing   Problem: RH BOWEL ELIMINATION Goal: RH STG MANAGE BOWEL WITH ASSISTANCE Description: STG Manage Bowel with mod I Assistance. Outcome: Progressing Goal: RH STG MANAGE BOWEL W/MEDICATION W/ASSISTANCE Description: STG Manage Bowel with Medication with mod I Assistance. Outcome: Progressing   Problem: RH BLADDER ELIMINATION Goal: RH STG MANAGE BLADDER WITH ASSISTANCE Description: STG Manage Bladder With mod I Assistance Outcome: Progressing Goal: RH STG MANAGE BLADDER WITH MEDICATION WITH ASSISTANCE Description: STG Manage Bladder With Medication With mod I Assistance. Outcome: Progressing   Problem: RH KNOWLEDGE DEFICIT Goal: RH STG INCREASE KNOWLEDGE OF DIABETES Description: Patient and spouse will be able to manage DM using educational resources for medications and dietary modification independently Outcome: Progressing Goal: RH STG INCREASE KNOWLEDGE OF HYPERTENSION Description: Patient and spouse will be able to manage HTN using educational resources for medications and dietary modification independently Outcome: Progressing Goal: RH STG INCREASE KNOWLEDGE OF DYSPHAGIA/FLUID INTAKE Description: Patient and spouse will be able to manage Dysphagia using educational resources for medications and dietary modification independently Outcome: Progressing Goal: RH STG INCREASE KNOWLEGDE OF HYPERLIPIDEMIA Description: Patient and spouse will be able to manage HLD using educational resources for medications and dietary modification independently Outcome: Progressing Goal: RH STG INCREASE KNOWLEDGE OF STROKE PROPHYLAXIS Description: Patient and spouse  will be able to manage secondary risks using educational resources for medications and dietary modification independently Outcome: Progressing   "

## 2024-05-07 NOTE — Consult Note (Signed)
 "  Cardiology Consultation   Patient ID: Christopher Roberts MRN: [id number]; DOB: 1945/09/05  Admit date: 04/24/2024 Date of Consult: 05/07/2024  PCP:  [name], [name], MD   [facility] Providers Cardiologist:  None        Patient Profile: Christopher Roberts is a 78 y.o. male with a hx of CAD s/p CABG and prior MI who is being seen 05/07/2024 for the evaluation of chest pain at the request of Dr Carilyn.  History of Present Illness: Christopher Roberts has known CAD s/p CABG in 2009 with LIMA to the LAD and SVG to PDA. In 2022 he presented with acute inferior STEMI with thrombotic occlusion of the SVG to PDA. Unsuccessful PCI and SVG remained occluded. Distal LCx also occluded. Patent LIMA to LAD. LV function ok.   More recently admitted with bilateral CVA. Echo unremarkable. Found to have severe bilateral carotid disease. Seen by Vascular surgery and underwent TCAR for left cervical carotid disease on 04/21/24. Plan for TCAR on right side at later date. He has pancytopenia with plans to work up for MDS. He has HTN, HLD, DM and history of tobacco abuse. Prior to hospitalization he was on amlodipine  and Coreg . These were held due to recent CVA and avoidance of hypotension. Was also placed on midodrine . Was transferred to inpatient Rehab on 12/18. Since on  Rehab BP normal. Today patient states he sat up and developed localized pain in the left upper chest. No radiation. No SOB. Lasted about 30 minutes then resolved. Had not had any chest pain prior to this. Feels fine now except very tired.    Past Medical History:  Diagnosis Date   CAD (coronary artery disease)    a. s/p MI in 2005 (symptom of indigestion); b. CABG x 3 in 3/09: L-LAD, S-OM, EF unknown   Colon polyps    Diabetes mellitus    DJD (degenerative joint disease)    HLD (hyperlipidemia)    HTN (hypertension)    MI (myocardial infarction) (HCC) 2005   manifested by indigestion   Murmur    echo 6/12:  Upper septal thickening,  no LVOT gradient,, EF 65%, mild LAE      Past Surgical History:  Procedure Laterality Date   CHOLECYSTECTOMY  2002   CORONARY ARTERY BYPASS GRAFT  2009   x3   CORONARY/GRAFT ACUTE MI REVASCULARIZATION N/A 07/25/2020   Procedure: Coronary/Graft Acute MI Revascularization;  Surgeon: Daphanie Oquendo M, MD;  Location: [facility] INVASIVE CV LAB;  Service: Cardiovascular;  Laterality: N/A;   LEFT HEART CATH AND CORS/GRAFTS ANGIOGRAPHY N/A 07/25/2020   Procedure: LEFT HEART CATH AND CORS/GRAFTS ANGIOGRAPHY;  Surgeon: Farryn Linares M, MD;  Location[facility] INVASIVE CV LAB;  Service: Cardiovascular;  Laterality: N/A;   TONSILLECTOMY     TRANSCAROTID ARTERY REVASCULARIZATION  Left 04/21/2024   Procedure: LEFT TRANSCAROTID ARTERY REVASCULARIZATION (TCAR);  Surgeon: [name], MD;  Location: [facility] OR;  Service: Vascular;  Laterality: Left;       Scheduled Meds:  aspirin   81 mg Oral Daily   atorvastatin   80 mg Oral q1800   cephALEXin   500 mg Oral Q8H   Chlorhexidine  Gluconate Cloth  6 each Topical Daily   clopidogrel   75 mg Oral Daily   docusate sodium   100 mg Oral Daily   feeding supplement  237 mL Oral BID BM   FLUoxetine   20 mg Oral Daily   insulin  aspart  0-9 Units Subcutaneous TID WC   midodrine   10 mg  Oral TID WC   tamsulosin   0.4 mg Oral QPC supper   Continuous Infusions:  sodium chloride  75 mL/hr at 05/07/24 1230   PRN Meds: acetaminophen  **OR** acetaminophen  (TYLENOL ) oral liquid 160 mg/5 mL **OR** acetaminophen , alum & mag hydroxide-simeth, guaiFENesin -dextromethorphan, melatonin, mouth rinse, polyethylene glycol, prochlorperazine   Allergies:   Allergies[1]  Social History:   Social History   Socioeconomic History   Marital status: Legally Separated    Spouse name: Paulette   Number of children: 2   Years of education: 12   Highest education level: Not on file  Occupational History   Occupation: Retired  Tobacco Use   Smoking status: Former    Current packs/day: 0.20     Average packs/day: 0.2 packs/day for 40.0 years (8.0 ttl pk-yrs)    Types: Cigarettes    Passive exposure: Current   Smokeless tobacco: Never   Tobacco comments:    quit march 2010 after 20 yrs  Vaping Use   Vaping status: Never Used  Substance and Sexual Activity   Alcohol use: No   Drug use: No   Sexual activity: Not on file  Other Topics Concern   Not on file  Social History Narrative   Lives with wife Paulette    Caffeine use: Coffee- 2 mugs per day   Left handed   Social Drivers of Health   Tobacco Use: Medium Risk (04/24/2024)   Patient History    Smoking Tobacco Use: Former    Smokeless Tobacco Use: Never    Passive Exposure: Current  Physicist, Medical Strain: Low Risk (03/12/2024)   Overall Financial Resource Strain (CARDIA)    Difficulty of Paying Living Expenses: Not hard at all  Food Insecurity: No Food Insecurity (04/16/2024)   Epic    Worried About Programme Researcher, Broadcasting/film/video in the Last Year: Never true    Ran Out of Food in the Last Year: Never true  Recent Concern: Food Insecurity - Food Insecurity Present (03/17/2024)   Epic    Worried About Programme Researcher, Broadcasting/film/video in the Last Year: Sometimes true    [employer] of Food in the Last Year: Never true  Transportation Needs: No Transportation Needs (04/16/2024)   Epic    Lack of Transportation (Medical): No    Lack of Transportation (Non-Medical): No  Physical Activity: Inactive (03/12/2024)   Exercise Vital Sign    Days of Exercise per Week: 0 days    Minutes of Exercise per Session: 0 min  Stress: No Stress Concern Present (03/12/2024)   [employer] of Occupational Health - Occupational Stress Questionnaire    Feeling of Stress: Not at all  Social Connections: Moderately Isolated (04/16/2024)   Social Connection and Isolation Panel    Frequency of Communication with Friends and Family: More than three times a week    Frequency of Social Gatherings with Friends and Family: Once a week    Attends Religious  Services: Never    Database Administrator or Organizations: No    Attends Banker Meetings: Never    Marital Status: Married  Catering Manager Violence: Not At Risk (04/16/2024)   Epic    Fear of Current or Ex-Partner: No    Emotionally Abused: No    Physically Abused: No    Sexually Abused: No  Depression (PHQ2-9): High Risk (03/17/2024)   Depression (PHQ2-9)    PHQ-2 Score: 20  Alcohol Screen: Low Risk (03/12/2024)   Alcohol Screen    Last Alcohol Screening Score (AUDIT): 0  Housing: Low Risk (04/16/2024)   Epic    Unable to Pay for Housing in the Last Year: No    Number of Times Moved in the Last Year: 0    Homeless in the Last Year: No  Utilities: Not At Risk (04/16/2024)   Epic    Threatened with loss of utilities: No  Recent Concern: Utilities - At Risk (03/17/2024)   Epic    Threatened with loss of utilities: Yes  Health Literacy: Adequate Health Literacy (03/12/2024)   B1300 Health Literacy    Frequency of need for help with medical instructions: Never    Family History:    Family History  Problem Relation Age of Onset   Colon cancer Mother    Heart attack Mother    High blood pressure Father    Diabetes Father      ROS:  Please see the history of present illness.   All other ROS reviewed and negative.     Physical Exam/Data: Vitals:   05/07/24 1025 05/07/24 1054 05/07/24 1139 05/07/24 1345  BP: (!) 79/47 104/60 122/71 121/70  Pulse:   95 94  Resp:   20 16  Temp:    98.4 F (36.9 C)  TempSrc:    Oral  SpO2:   96% 94%  Weight:      Height:        Intake/Output Summary (Last 24 hours) at 05/07/2024 1509 Last data filed at 05/07/2024 0600 Gross per 24 hour  Intake 240 ml  Output 1100 ml  Net -860 ml      04/28/2024    5:46 AM 04/25/2024    2:05 AM 04/24/2024    5:02 PM  Last 3 Weights  Weight (lbs) 168 lb 14 oz 159 lb 13.3 oz 169 lb 15.6 oz  Weight (kg) 76.6 kg 72.5 kg 77.1 kg     Body mass index is 24.23 kg/m.  General:   Well nourished, well developed, in no acute distress HEENT: normal. Missing teeth Neck: no JVD Vascular: No carotid bruits; Distal pulses 2+ bilaterally Cardiac:  normal S1, S2; RRR; no murmur  Lungs:  clear to auscultation bilaterally, no wheezing, rhonchi or rales  Abd: soft, nontender, no hepatomegaly  Ext: no edema Musculoskeletal:  No deformities, BUE and BLE strength normal and equal Skin: warm and dry  Neuro:  CNs 2-12 intact, no focal abnormalities noted Psych:  Normal affect   EKG:  The EKG was personally reviewed and demonstrates:  NSR with PVCs. Nonspecific ST abnormality.  Telemetry:  Telemetry was personally reviewed and demonstrates:  N/A  Relevant CV Studies: Echo 04/16/24: IMPRESSIONS     1. Left ventricular ejection fraction, by estimation, is 60 to 65%. The  left ventricle has normal function. The left ventricle has no regional  wall motion abnormalities. There is mild asymmetric left ventricular  hypertrophy of the basal-septal segment.  Left ventricular diastolic parameters are consistent with Grade I  diastolic dysfunction (impaired relaxation).   2. Right ventricular systolic function is normal. The right ventricular  size is mildly enlarged. There is normal pulmonary artery systolic  pressure. The estimated right ventricular systolic pressure is 16.1 mmHg.   3. Left atrial size was moderately dilated.   4. The mitral valve is degenerative. Mild mitral valve regurgitation. No  evidence of mitral stenosis. Moderate mitral annular calcification.   5. The aortic valve is tricuspid. There is mild calcification of the  aortic valve. There is mild thickening of the aortic valve. Aortic valve  regurgitation is trivial. Aortic valve sclerosis is present, with no  evidence of aortic valve stenosis.   6. The inferior vena cava is normal in size with greater than 50%  respiratory variability, suggesting right atrial pressure of 3 mmHg.    Conclusion(s)/Recommendation(s): No intracardiac source of embolism  detected on this transthoracic study. Consider a transesophageal  echocardiogram to exclude cardiac source of embolism if clinically  indicated.    Laboratory Data: High Sensitivity Troponin:  No results for input(s): TROPONINIHS in the last 720 hours.  Recent Labs  Lab 05/07/24 1216  TRNPT 32*      Chemistry Recent Labs  Lab 05/01/24 0552 05/03/24 1431 05/05/24 0522  NA 135 138 137  K 4.3 3.9 4.0  CL 106 106 106  CO2 20* 22 20*  GLUCOSE 107* 125* 141*  BUN 24* 37* 46*  CREATININE 1.14 1.20 1.27*  CALCIUM  8.5* 9.1 8.7*  GFRNONAA >60 >60 57*  ANIONGAP 9 10 11     Recent Labs  Lab 05/03/24 1431  PROT 7.4  ALBUMIN  3.4*  AST 51*  ALT 50*  ALKPHOS 149*  BILITOT 1.3*   Lipids No results for input(s): CHOL, TRIG, HDL, LABVLDL, LDLCALC, CHOLHDL in the last 168 hours.  Hematology Recent Labs  Lab 05/01/24 0552 05/03/24 1431 05/05/24 0522  WBC 2.4* 3.1* 3.1*  RBC 2.64* 2.99* 2.88*  HGB 8.2* 9.2* 8.9*  HCT 23.7* 26.9* 26.0*  MCV 89.8 90.0 90.3  MCH 31.1 30.8 30.9  MCHC 34.6 34.2 34.2  RDW 18.8* 18.9* 19.1*  PLT 51* 58* 61*   Thyroid  No results for input(s): TSH, FREET4 in the last 168 hours.  BNPNo results for input(s): BNP, PROBNP in the last 168 hours.  DDimer No results for input(s): DDIMER in the last 168 hours.  Radiology/Studies:  No results found.   Assessment and Plan: CAD s/p remote CABG. S/p inferior STEMI in 2022 with occlusion of SVG to RCA. Chronic occlusion of distal LCx. Has LIMA to LAD. Today had chest pain lasting 30 minutes. Had previously been doing well from a CV standpoint. ECG with PVCs but otherwise stable. Normal ED by recent Echo. Troponin 32 at noon today. Will repeat troponin now and Ecg in am. Would like to resume some antianginal therapy. BP has been stable so will discontinue midodrine  and start Coreg  6.25 mg bid. Can titrate antianginal  therapy as BP allows.  S/p bilateral CVA. On DAPT. S/p left TCAR. Plan for right TCAR in future. Pancytopenia. 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  HTN HLD.  DM    Risk Assessment/Risk Scores:              For questions or updates, please contact  [facility] Please consult [email or web address] for contact info under      Signed, Kamaljit Hizer, MD  05/07/2024 3:09 PM     [1] No Known Allergies  "

## 2024-05-07 NOTE — Progress Notes (Signed)
 Physical Therapy Note  Patient Details  Name: Christopher Roberts MRN: [id number] Date of Birth: December 10, 1945 Today's Date: 05/07/2024    Physical Therapist participated in the interdisciplinary team conference, providing clinical information regarding the patient's current status, treatment goals, and weekly focus, including any barriers that need to be addressed. Please see the Inpatient Rehabilitation Team Conference and Plan of Care Update for further details.    Christopher Roberts 05/07/2024, 10:30 AM

## 2024-05-07 NOTE — Progress Notes (Signed)
 EKG unremarkable with some PVCs.  Troponin mildly elevated at 32.  Chest pain has subsided.  Will ask cardiology to drop in for any further recommendations

## 2024-05-07 NOTE — Progress Notes (Signed)
 "                                                        PROGRESS NOTE   Subjective/Complaints:  No issues overnite required cath x 2 but one cath volume only   ROS: as per HPI. Denies CP, SOB, abd pain, N/V/D/C, or any other complaints at this time.    Objective:   No results found.  Recent Labs    05/05/24 0522  WBC 3.1*  HGB 8.9*  HCT 26.0*  PLT 61*   Recent Labs    05/05/24 0522  NA 137  K 4.0  CL 106  CO2 20*  GLUCOSE 141*  BUN 46*  CREATININE 1.27*  CALCIUM  8.7*    Intake/Output Summary (Last 24 hours) at 05/07/2024 0749 Last data filed at 05/07/2024 0600 Gross per 24 hour  Intake 540 ml  Output 1100 ml  Net -560 ml        Physical Exam: Vital Signs Blood pressure 114/69, pulse 87, temperature 98.7 F (37.1 C), resp. rate 17, height 5' 10 (1.778 m), weight 76.6 kg, SpO2 100%.   General: No acute distress, sitting up in w/c, looks a little tired.  Mood and affect are appropriate, a little flat Heart: Regular rate and rhythm no rubs murmurs or extra sounds Lungs: Clear to auscultation, breathing unlabored, no rales or wheezes Abdomen: Positive bowel sounds, soft nontender to palpation, nondistended Extremities: No clubbing, cyanosis, or edema Skin: No evidence of breakdown, no evidence of rash to exposed surfaces, scattered bruises, L cartoid surgical incision remains c/d/I  PRIOR EXAMS: Neurologic: Cranial nerves II through XII intact, motor strength is 5/5 in left UE/LE and 5/5 right deltoid, bicep, tricep, grip, 4/5 hip flexor, knee extensors, ankle dorsiflexor and plantar flexor Sensory exam normal sensation to light touch  in bilateral upper and left lower lower extremities Reduced LT RLE Oriented to person and place, month, day Cerebellar exam mild dysmetria Right finger nose finger  Musculoskeletal: Full range of motion in all 4 extremities. No joint swelling    Assessment/Plan: 1. Functional deficits which require 3+ hours per  day of interdisciplinary therapy in a comprehensive inpatient rehab setting. Physiatrist is providing close team supervision and 24 hour management of active medical problems listed below. Physiatrist and rehab team continue to assess barriers to discharge/monitor patient progress toward functional and medical goals  Care Tool:  Bathing    Body parts bathed by patient: Right arm, Left upper leg, Left arm, Right lower leg, Chest, Left lower leg, Abdomen, Face, Front perineal area, Buttocks, Right upper leg         Bathing assist Assist Level: Supervision/Verbal cueing     Upper Body Dressing/Undressing Upper body dressing   What is the patient wearing?: Pull over shirt    Upper body assist Assist Level: Supervision/Verbal cueing    Lower Body Dressing/Undressing Lower body dressing      What is the patient wearing?: Underwear/pull up, Pants     Lower body assist Assist for lower body dressing: Contact Guard/Touching assist     Toileting Toileting    Toileting assist Assist for toileting: Minimal Assistance - Patient > 75%     Transfers Chair/bed transfer  Transfers assist     Chair/bed transfer assist level: Contact Guard/Touching assist  Locomotion Ambulation   Ambulation assist      Assist level: Contact Guard/Touching assist Assistive device: Walker-rolling Max distance: 100 feet   Walk 10 feet activity   Assist     Assist level: Contact Guard/Touching assist Assistive device: Walker-rolling   Walk 50 feet activity   Assist Walk 50 feet with 2 turns activity did not occur: Safety/medical concerns  Assist level: Contact Guard/Touching assist Assistive device: Walker-rolling    Walk 150 feet activity   Assist Walk 150 feet activity did not occur: Safety/medical concerns (fatigue/SOB)         Walk 10 feet on uneven surface  activity   Assist Walk 10 feet on uneven surfaces activity did not occur: Safety/medical concerns          Wheelchair     Assist Is the patient using a wheelchair?: Yes Type of Wheelchair: Manual    Wheelchair assist level: Dependent - Patient 0% Max wheelchair distance: 15 ft    Wheelchair 50 feet with 2 turns activity    Assist        Assist Level: Dependent - Patient 0%   Wheelchair 150 feet activity     Assist      Assist Level: Dependent - Patient 0%   Blood pressure 114/69, pulse 87, temperature 98.7 F (37.1 C), resp. rate 17, height 5' 10 (1.778 m), weight 76.6 kg, SpO2 100%.  Medical Problem List and Plan: 1. 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 Formulized individual treatment plan based on medical history, underlying problem and comorbidities.    2.  Antithrombotics: -DVT/anticoagulation:  Mechanical: Antiembolism stockings, thigh (TED hose) Bilateral lower extremities             -antiplatelet therapy: Aspirin  81 mg daily and Plavix  75 mg daily  3. Pain Management: Tylenol  as needed  4. Mood/Behavior/Sleep: Prozac  20 mg daily.             -antipsychotic agents: N/A  -melatonin prn  5. Neuropsych/cognition: This patient is capable of making decisions on his own behalf.  6. Skin/Wound Care: Routine skin checks  7. Fluids/Electrolytes/Nutrition: Routine and analysis with follow-up chemistries  8.  Symptomatic left carotid artery stenosis.  Status post stenting/angioplasty 04/21/2024 per Dr. Gretta  9.  Hypotension: continue ProAmatine  10 mg 3 times daily.   Monitor with increased mobility  -12/25-28 BPs remain stable, continue to monitor  Vitals:   05/03/24 1959 05/04/24 0610 05/04/24 1156 05/04/24 1950  BP: 117/69 118/68 129/77 (!) 161/73   05/04/24 2130 05/05/24 0318 05/05/24 1525 05/05/24 2125  BP: 128/65 115/60 (!) 152/73 120/87   05/06/24 0622 05/06/24 1653 05/06/24 2024 05/07/24 0601  BP: 131/69 (!) 145/79 117/65 114/69    10.  Diabetes mellitus.  Hemoglobin A1c 5.6.  Currently on SSI.  Prior to admission patient on Glucophage  500 mg daily.  Resume as needed CBGs ok  if they start climbing above 180 will restart metformin   -12/29 controlled   CBG (last 3)  Recent Labs    05/06/24 1649 05/06/24 2024 05/07/24 0616  GLUCAP 155* 227* 179*    11.  Hyperlipidemia: continue Lipitor  80mg  daily  12.  History of CAD/PCI/CABG/SVT.  Follow-up [name] outpatient  13.  Pancytopenia.  Heme oncology Dr.Pasam outpatient.  Follow-up CBC -No heparin  for DVT prophyllaxis, monitor for bleeding on DAPT transfuse if PLT<20K or Hgb <7.0 -12/25 labs holding today. No action required  -continue to monitor serially -05/04/24 labs from yesterday stable/slightly better.    Latest Ref Rng & Units 05/05/2024    5:22 AM 05/03/2024    2:31 PM 05/01/2024    5:52 AM  CBC  WBC 4.0 - 10.5 K/uL 3.1  3.1  2.4   Hemoglobin 13.0 - 17.0 g/dL 8.9  9.2  8.2   Hematocrit 39.0 - 52.0 % 26.0  26.9  23.7   Platelets 150 - 400 K/uL 61  58  51     14.  Cirrhosis of liver.  Imaging suggestive of cirrhosis.  Bili mildly elevated as well as INR 1.3.  Follow-up outpatient  15.  Urinary retention.  In-N-Out catheterization 12/13 x 1.  Placed on Flomax . Messaged nursing to discuss her PVRs. -04/27/24 flomax  d/c'd yesterday, no PVRs documented, will discuss with nursing-- ordered PVRs -12/25 PVRs variable. Had to be cathed once yesterday for pvr of 350.   -continue PVR's for now -05/03/24 only a few PVRs documented, all low, no caths; spiked fever last night,  checking labs/urine/cxr as below. -05/04/24 labs yesterday stable/ok, but U/A looking iffy with cloudy urine, trace leuks, 21-50 WBC though no bacteria-- however pt not feeling well today and having incontinence, highly suspect UTI-- STARTING Keflex  500mg  TID x7d, UCx  Proteus mirabilis , >100K S to Keflex   Restart flomax  as PVRs are now up , no anticholinergic meds identified - monitor BPs on Flomax  which was held due to low BPs   16.  Constipation: continue Colace 100 mg daily -05/04/24 LBM this AM.   17. Fever 12/26: -05/03/24 pt with 100.7 temp last night, not reported to provider; room is very warm-- getting CBC, CMP, CXR, U/A, hold off on empiric abx just yet-- let w/up guide tx.  -05/04/24 no further fevers, CBC stable, CMP with mild transaminitis, CXR neg, U/A iffy as mentioned above, suspect UTI, start Keflex  as above. UCx shows + proteus  afebrile since 12/26  18.  Pre renal azotemia - IVF at bedtime recheck BMP in am   LOS: 13 days A FACE TO FACE EVALUATION WAS PERFORMED  Christopher Roberts 05/07/2024, 7:49 AM     "

## 2024-05-08 LAB — BASIC METABOLIC PANEL WITH GFR
Anion gap: 9 (ref 5–15)
BUN: 30 mg/dL — ABNORMAL HIGH (ref 8–23)
CO2: 20 mmol/L — ABNORMAL LOW (ref 22–32)
Calcium: 8.1 mg/dL — ABNORMAL LOW (ref 8.9–10.3)
Chloride: 108 mmol/L (ref 98–111)
Creatinine, Ser: 1.06 mg/dL (ref 0.61–1.24)
GFR, Estimated: >60 mL/min
Glucose, Bld: 121 mg/dL — ABNORMAL HIGH (ref 70–99)
Potassium: 4 mmol/L (ref 3.5–5.1)
Sodium: 137 mmol/L (ref 135–145)

## 2024-05-08 LAB — CBC
HCT: 22.1 % — ABNORMAL LOW (ref 39.0–52.0)
Hemoglobin: 7.6 g/dL — ABNORMAL LOW (ref 13.0–17.0)
MCH: 30.8 pg (ref 26.0–34.0)
MCHC: 34.4 g/dL (ref 30.0–36.0)
MCV: 89.5 fL (ref 80.0–100.0)
Platelets: 43 K/uL — ABNORMAL LOW (ref 150–400)
RBC: 2.47 MIL/uL — ABNORMAL LOW (ref 4.22–5.81)
RDW: 18.6 % — ABNORMAL HIGH (ref 11.5–15.5)
WBC: 2.4 K/uL — ABNORMAL LOW (ref 4.0–10.5)
nRBC: 0.8 % — ABNORMAL HIGH (ref 0.0–0.2)

## 2024-05-08 LAB — GLUCOSE, CAPILLARY
Glucose-Capillary: 121 mg/dL — ABNORMAL HIGH (ref 70–99)
Glucose-Capillary: 165 mg/dL — ABNORMAL HIGH (ref 70–99)
Glucose-Capillary: 253 mg/dL — ABNORMAL HIGH (ref 70–99)
Glucose-Capillary: 154 mg/dL — ABNORMAL HIGH (ref 70–99)

## 2024-05-08 MED ORDER — SODIUM CHLORIDE 0.9 % IV SOLN: INTRAVENOUS | Status: AC

## 2024-05-08 MED ORDER — CARVEDILOL 6.25 MG PO TABS
3.1250 mg | ORAL_TABLET | Freq: Two times a day (BID) | ORAL | Status: DC
  Administered 2024-05-08 – 2024-05-09 (×2): 3.125 mg via ORAL
  Filled 2024-05-08 (×2): qty 1

## 2024-05-08 NOTE — Progress Notes (Signed)
 "                                                        PROGRESS NOTE   Subjective/Complaints:  Now with foley cath  No further CP, appreciate cardiology assist   ROS: as per HPI. Denies CP, SOB, abd pain, N/V/D/C, or any other complaints at this time.    Objective:   No results found.  Recent Labs    05/08/24 0608  WBC 2.4*  HGB 7.6*  HCT 22.1*  PLT 43*   Recent Labs    05/08/24 0608  NA 137  K 4.0  CL 108  CO2 20*  GLUCOSE 121*  BUN 30*  CREATININE 1.06  CALCIUM  8.1*    Intake/Output Summary (Last 24 hours) at 05/08/2024 0818 Last data filed at 05/08/2024 0738 Gross per 24 hour  Intake 3494.38 ml  Output 875 ml  Net 2619.38 ml        Physical Exam: Vital Signs Blood pressure (!) 100/54, pulse 72, temperature 98.8 F (37.1 C), temperature source Oral, resp. rate 18, height 5' 10 (1.778 m), weight 76.6 kg, SpO2 100%.   General: No acute distress, sitting up in w/c, looks a little tired.  Mood and affect are appropriate, a little flat Heart: Regular rate and rhythm no rubs murmurs or extra sounds Lungs: Clear to auscultation, breathing unlabored, no rales or wheezes Abdomen: Positive bowel sounds, soft nontender to palpation, nondistended Extremities: No clubbing, cyanosis, or edema Skin: No evidence of breakdown, no evidence of rash to exposed surfaces, scattered bruises, L cartoid surgical incision remains c/d/I  PRIOR EXAMS: Neurologic: Cranial nerves II through XII intact, motor strength is 5/5 in left UE/LE and 5/5 right deltoid, bicep, tricep, grip, 4/5 hip flexor, knee extensors, ankle dorsiflexor and plantar flexor Sensory exam normal sensation to light touch  in bilateral upper and left lower lower extremities Reduced LT RLE Oriented to person and place, month, day Cerebellar exam mild dysmetria Right finger nose finger  Musculoskeletal: Full range of motion in all 4 extremities. No joint swelling    Assessment/Plan: 1. Functional deficits  which require 3+ hours per day of interdisciplinary therapy in a comprehensive inpatient rehab setting. Physiatrist is providing close team supervision and 24 hour management of active medical problems listed below. Physiatrist and rehab team continue to assess barriers to discharge/monitor patient progress toward functional and medical goals  Care Tool:  Bathing    Body parts bathed by patient: Right arm, Left upper leg, Left arm, Right lower leg, Chest, Left lower leg, Abdomen, Face, Front perineal area, Buttocks, Right upper leg         Bathing assist Assist Level: Supervision/Verbal cueing     Upper Body Dressing/Undressing Upper body dressing   What is the patient wearing?: Pull over shirt    Upper body assist Assist Level: Supervision/Verbal cueing    Lower Body Dressing/Undressing Lower body dressing      What is the patient wearing?: Underwear/pull up, Pants     Lower body assist Assist for lower body dressing: Contact Guard/Touching assist     Toileting Toileting    Toileting assist Assist for toileting: Maximal Assistance - Patient 25 - 49%     Transfers Chair/bed transfer  Transfers assist     Chair/bed transfer assist level: Contact Guard/Touching assist  Locomotion Ambulation   Ambulation assist      Assist level: Contact Guard/Touching assist Assistive device: Walker-rolling Max distance: 100 feet   Walk 10 feet activity   Assist     Assist level: Contact Guard/Touching assist Assistive device: Walker-rolling   Walk 50 feet activity   Assist Walk 50 feet with 2 turns activity did not occur: Safety/medical concerns  Assist level: Contact Guard/Touching assist Assistive device: Walker-rolling    Walk 150 feet activity   Assist Walk 150 feet activity did not occur: Safety/medical concerns (fatigue/SOB)         Walk 10 feet on uneven surface  activity   Assist Walk 10 feet on uneven surfaces activity did not occur:  Safety/medical concerns         Wheelchair     Assist Is the patient using a wheelchair?: Yes Type of Wheelchair: Manual    Wheelchair assist level: Dependent - Patient 0% Max wheelchair distance: 15 ft    Wheelchair 50 feet with 2 turns activity    Assist        Assist Level: Dependent - Patient 0%   Wheelchair 150 feet activity     Assist      Assist Level: Dependent - Patient 0%   Blood pressure (!) 100/54, pulse 72, temperature 98.8 F (37.1 C), temperature source Oral, resp. rate 18, height 5' 10 (1.778 m), weight 76.6 kg, SpO2 100%.  Medical Problem List and Plan: 1. 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 Formulized individual treatment plan based on medical history, underlying problem and comorbidities.    2.  Antithrombotics: -DVT/anticoagulation:  Mechanical: Antiembolism stockings, thigh (TED hose) Bilateral lower extremities             -antiplatelet therapy: Aspirin  81 mg daily and Plavix  75 mg daily  3. Pain Management: Tylenol  as needed  4. Mood/Behavior/Sleep: Prozac  20 mg daily.             -antipsychotic agents: N/A  -melatonin prn  5. Neuropsych/cognition: This patient is capable of making decisions on his own behalf.  6. Skin/Wound Care: Routine skin checks  7. Fluids/Electrolytes/Nutrition: Routine and analysis with follow-up chemistries  8.  Symptomatic left carotid artery stenosis.  Status post stenting/angioplasty 04/21/2024 per Dr.  Gretta  9.  Hypotension: continue ProAmatine  10 mg 3 times daily.  Monitor with increased mobility  Stopped by Cardiology due to CP  Vitals:   05/06/24 0622 05/06/24 1653 05/06/24 2024 05/07/24 0601  BP: 131/69 (!) 145/79 117/65 114/69   05/07/24 1015 05/07/24 1025 05/07/24 1054 05/07/24 1139  BP: (!) 93/59 (!) 79/47 104/60 122/71   05/07/24 1345 05/07/24 2032 05/08/24 0458 05/08/24 0622  BP: 121/70 107/61 (!) 101/55 (!) 100/54    10.  Diabetes mellitus.  Hemoglobin A1c 5.6.  Currently on SSI.  Prior to admission patient on Glucophage  500 mg daily.  Resume as needed CBGs ok if they start climbing above 180 will restart metformin   -12/29 controlled   CBG (last 3)  Recent Labs    05/07/24 1657 05/07/24 2034 05/08/24 0555  GLUCAP 179* 175* 121*    11.  Hyperlipidemia: continue Lipitor  80mg  daily  12.  History of CAD/PCI/CABG/SVT.  Follow-up [name] outpatient  13.  Pancytopenia.  Heme oncology Dr.Pasam outpatient.  Follow-up CBC -No heparin  for DVT prophyllaxis, monitor for bleeding on DAPT transfuse if PLT<20K or Hgb <7.0     Latest Ref Rng & Units 05/08/2024    6:08 AM 05/05/2024    5:22 AM 05/03/2024    2:31 PM  CBC  WBC 4.0 - 10.5 K/uL 2.4  3.1  3.1   Hemoglobin 13.0 - 17.0 g/dL 7.6  8.9  9.2   Hematocrit 39.0 - 52.0 % 22.1  26.0  26.9   Platelets 150 - 400 K/uL 43  61  58   Hgb lower today , will recheck in am , foley bag light pink tinge, no clots, some of the drop in Hgb is dilutional due to rehydration with IVF   14.  Cirrhosis of liver.  Imaging suggestive of cirrhosis.  Bili mildly elevated as well as INR 1.3.  Follow-up outpatient  15.  Urinary retention.  In-N-Out catheterization 12/13 x 1.  Placed on Flomax . Messaged nursing to discuss her PVRs. -04/27/24 flomax  d/c'd yesterday, no PVRs documented, will discuss with nursing-- ordered PVRs -12/25 PVRs variable. Had to be cathed once yesterday for pvr of 350.   -continue PVR's for now -05/03/24 only a  few PVRs documented, all low, no caths; spiked fever last night, checking labs/urine/cxr as below. -05/04/24 labs yesterday stable/ok, but U/A looking iffy with cloudy urine, trace leuks, 21-50 WBC though no bacteria-- however pt not feeling well today and having incontinence, highly suspect UTI-- STARTING Keflex  500mg  TID x7d, UCx  Proteus mirabilis , >100K S to Keflex   Restart flomax  as PVRs are now up , no anticholinergic meds identified - monitor BPs on Flomax  which was held due to low BPs   16.  Constipation: continue Colace 100 mg daily -05/04/24 LBM this AM.   17. Fever due to UTI resolved  18.  Pre renal azotemia - IVF at bedtime but change to .9NS recheck BMP in am   19.  Angina- no evidence of MI, now on Coreg  and off midodrine , concerns about hypotension monitor closely   LOS: 14 days A FACE TO FACE EVALUATION WAS PERFORMED  Christopher Roberts 05/08/2024, 8:18 AM     "

## 2024-05-08 NOTE — Progress Notes (Signed)
 Occupational Therapy Session Note  Patient Details  Name: Christopher Roberts MRN: [id number] Date of Birth: 1945/11/19  Today's Date: 05/08/2024 OT Individual Time: 0732-0827 OT Individual Time Calculation (min): 55 min  and Today's Date: 05/08/2024 OT Missed Time: 20 Minutes Missed Time Reason: Patient fatigue   Short Term Goals: Week 2:  OT Short Term Goal 1 (Week 2): STG =LTG d.t ELOS  Skilled Therapeutic Interventions/Progress Updates:     Pt received lightly sleeping in bed, waking upon OT arrival. Pt presenting to be in good spirits receptive to skilled OT session reporting 0/10 pain- OT offering intermittent rest breaks, repositioning, and therapeutic support to optimize participation in therapy session. Pt noted to have thigh high TEDs and abdominal donned prior to OT arrival, Pt reporting he slept in them last night. Provided education on purpose of thigh high TEDs and abdominal binder for BP management and correct wear schedule as Pt should not be sleeping in these with Pt receptive to education and verbalizing understanding. Completed BP assessment at beginning of session to determine prevalence of orthostatic hypotension with TEDs and abd binder donned:   -Vitals Supine: HR 76 BP 106/59(74) SPO2 96%  -Vitals EOB: HR 78 BP 95/56(69) SPO2 98%  -Vitals standing: HR 83 BP 67/38(48) SPO2 99%  -Vitals following 2 min seated: HR 80 BP 96/55(69) SPO2 96% Pt reporting no dizziness when supine, some sitting EOB, and significant dizziness when standing with increased labored breathing noted and difficulty tolerating standing position for duration of BP assessment. Pt also able to complete bed mobility with SUP with session, however required increased time and MAX effort to complete. Engaged Pt in completing ADLs seated EOB to work on tolerance of upright position. Completed UB bath/dressing with SUP. MOD A required for LB bathe/dressing for catheter management, washing peri-areas, and for clothing  management in standing. Provided education on orthostatic hypotension management and encouraged fluids throughout session. Pt fatigued following ADLs, requesting to return to bed and finish session early. Doffed abd binder and TEDs at end of session. Pt was left resting in bed with call bell in reach, bed alarm on, and all needs met.  Missed 20 min skilled OT treatment 2/2 fatigue and low BP. Will attempt to make up time as Pt's status and schedule allow.   Therapy Documentation Precautions:  Precautions Precautions: Fall Recall of Precautions/Restrictions: Impaired Precaution/Restrictions Comments: RLE weakness, RUE weakness Restrictions Weight Bearing Restrictions Per Provider Order: No   Therapy/Group: Individual Therapy  Katheryn SHAUNNA [place] 05/08/2024, 7:14 AM

## 2024-05-08 NOTE — Plan of Care (Signed)
" °  Problem: RH Memory Goal: LTG Patient will use memory compensatory aids to (SLP) Description: LTG:  Patient will use memory compensatory aids to recall biographical/new, daily complex information with cues (SLP) Flowsheets (Taken 05/08/2024 1041) LTG: Patient will use memory compensatory aids to (SLP): Minimal Assistance - Patient > 75%   "

## 2024-05-08 NOTE — Progress Notes (Signed)
"  °  Progress Note  Patient Name: Christopher Roberts Date of Encounter: 05/08/2024 [facility] Cardiologist: Aila Terra MD  Interval Summary   Denies any recurrent chest pain. No SOB  Vital Signs Vitals:   05/07/24 2113 05/07/24 2315 05/08/24 0458 05/08/24 0622  BP:   (!) 101/55 (!) 100/54  Pulse: 79  74 72  Resp:   18 18  Temp:  99.8 F (37.7 C) 98.8 F (37.1 C)   TempSrc:  Oral Oral   SpO2: 99%  95% 100%  Weight:      Height:        Intake/Output Summary (Last 24 hours) at 05/08/2024 0737 Last data filed at 05/08/2024 0318 Gross per 24 hour  Intake 3374.38 ml  Output 875 ml  Net 2499.38 ml      04/28/2024    5:46 AM 04/25/2024    2:05 AM 04/24/2024    5:02 PM  Last 3 Weights  Weight (lbs) 168 lb 14 oz 159 lb 13.3 oz 169 lb 15.6 oz  Weight (kg) 76.6 kg 72.5 kg 77.1 kg      Telemetry/ECG  NSR - Personally Reviewed Ecg today with NSR. No acute ST-T changes.   Physical Exam  GEN: No acute distress.   Neck: No JVD Cardiac: RRR, no murmurs, rubs, or gallops.  Respiratory: Clear to auscultation bilaterally. GI: Soft, nontender, non-distended  MS: No edema  Assessment & Plan  CAD s/p remote CABG. S/p inferior STEMI in 2022 with occlusion of SVG to RCA. Chronic occlusion of distal LCx. Has LIMA to LAD. Yesterday had chest pain lasting 30 minutes. Had previously been doing well from a CV standpoint. ECG with PVCs but otherwise stable. Normal ED by recent Echo. Troponin 32> 47>64. Minimal elevation with flat trend. Would like to resume some antianginal therapy. Midodrine  discontinued. Given soft BP will resume Coreg  at low dose 3.125 mg bid. Can titrate antianginal therapy as BP allows. I don't think further ischemic work up needed at this time unless recurrent chest pain. Will need close cardiac follow up as outpatient to adjust medications.  S/p bilateral CVA. On DAPT. S/p left TCAR. Plan for right TCAR in future. Pancytopenia. 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  HTN HLD.  DM       For questions or updates, please contact [facility] Please consult [email or web address] for contact info under         Signed, Bethenny Losee, MD   "

## 2024-05-08 NOTE — Progress Notes (Signed)
 Speech Language Pathology Daily Session Note  Patient Details  Name: Christopher Roberts MRN: [id number] Date of Birth: Sep 26, 1945  Today's Date: 05/08/2024 SLP Individual Time: 1000-1058 SLP Individual Time Calculation (min): 58 min  Short Term Goals: Week 2: SLP Short Term Goal 1 (Week 2): STGs = LTGs d/t ELOS  Skilled Therapeutic Interventions:   Pt greeted at bedside for tx targeting cognition and communication. He was asleep upon SLP arrival, but woke easily w/ verbal greeting.  He completed a mildly specific responsive naming task w/ supervisionA for word finding. Adequate information processing time and sustained attention noted throughout. During task of slightly increased complexity, he benefited from minA to ID 2 meanings or uses for mildly specific items in the home environment. MinA required d/t word finding deficits > problem solving deficits. Did review WRAP memory strategies previously provided via written aid and SLP facilitated additional education re functional ways to utilize them. After ~5 mins, he required maxA to spontaneously recall these. He continues to demonstrate improved recall of functional information/events vs specific education and tx tasks. Final tx task targeted specific word finding and he benefited from minA to fill in missing letters and complete words. At the end of tx tasks, he was left in his bed w/ the alarm set and call light within reach. STM LTG updated given extended LOS and progress thus far. Recommend cont ST per updated POC.   Pain  No pain reported   Therapy/Group: Individual Therapy  [name] 05/08/2024, 10:19 AM

## 2024-05-08 NOTE — Progress Notes (Signed)
 Physical Therapy Session Note  Patient Details  Name: Christopher Roberts MRN: [id number] Date of Birth: November 01, 1945  Today's Date: 05/08/2024 PT Individual Time: 1415-1526 PT Individual Time Calculation (min): 71 min   Short Term Goals: Week 2:  PT Short Term Goal 1 (Week 2): STG = LTG 2/2 LOS  Skilled Therapeutic Interventions/Progress Updates:      Pt resting in bed to start - wakens to voice and in agreement to therapy treatment. He has no c/o pain but does report ongoing dizziness while standing up. Donned compression socks, abdominal binder, and ace wrapped his lower extremities for BP support. Pt aware of extended DC date to next week (1/6).  Supine to sitting EOB with assist for trunk support. Able to scoot himself to EOB without assist with extra effort. Sit<>stand with minA and stand pivot transfer to w/c with minA with cues for approach and controlled descent.   W/c propulsion ~25' with supervision with cues for equal push bilaterally to keep straight path. Pt having difficulty navigating in room with tight spaces and doorways. Pt dependently transported remaining distance for time management and energy conservation to the day room gym.   Pt participated in seated level of therapeutic activity in group setting with individualized treatment. Focused on gross motor control, generalized strengthening, and cardiovascular endurance training. Rest breaks as needed for recovery.   Pt finished session on seated Nustep with BUE/BLE using SPM program (set to 60). Completed for 10 minutes total without a rest break. Load resistance set to auto adjust, ranging from L2-L4. 466 steps total achieved.   Pt returned to his room where he was assisted back to bed with modA stand step transfer without AD or UE support. Supervision for returning to supine. Removed all compression garments and patient left with needs met, alarm on, call bell in reach.   Therapy Documentation Precautions:   Precautions Precautions: Fall Recall of Precautions/Restrictions: Impaired Precaution/Restrictions Comments: RLE weakness, RUE weakness Restrictions Weight Bearing Restrictions Per Provider Order: No General:     Therapy/Group: Individual Therapy  Sherlean SHAUNNA Perks 05/08/2024, 2:19 PM

## 2024-05-09 LAB — BASIC METABOLIC PANEL WITH GFR
Anion gap: 9 (ref 5–15)
BUN: 28 mg/dL — ABNORMAL HIGH (ref 8–23)
CO2: 21 mmol/L — ABNORMAL LOW (ref 22–32)
Calcium: 7.8 mg/dL — ABNORMAL LOW (ref 8.9–10.3)
Chloride: 108 mmol/L (ref 98–111)
Creatinine, Ser: 1.13 mg/dL (ref 0.61–1.24)
GFR, Estimated: >60 mL/min
Glucose, Bld: 136 mg/dL — ABNORMAL HIGH (ref 70–99)
Potassium: 4 mmol/L (ref 3.5–5.1)
Sodium: 138 mmol/L (ref 135–145)

## 2024-05-09 LAB — HEMOGLOBIN AND HEMATOCRIT, BLOOD
HCT: 21.9 % — ABNORMAL LOW (ref 39.0–52.0)
Hemoglobin: 7.5 g/dL — ABNORMAL LOW (ref 13.0–17.0)

## 2024-05-09 LAB — GLUCOSE, CAPILLARY
Glucose-Capillary: 128 mg/dL — ABNORMAL HIGH (ref 70–99)
Glucose-Capillary: 198 mg/dL — ABNORMAL HIGH (ref 70–99)
Glucose-Capillary: 283 mg/dL — ABNORMAL HIGH (ref 70–99)
Glucose-Capillary: 137 mg/dL — ABNORMAL HIGH (ref 70–99)

## 2024-05-09 MED ORDER — MIDODRINE HCL 5 MG PO TABS
2.5000 mg | ORAL_TABLET | Freq: Three times a day (TID) | ORAL | Status: DC
  Administered 2024-05-09 – 2024-05-10 (×2): 2.5 mg via ORAL
  Filled 2024-05-09 (×2): qty 1

## 2024-05-09 NOTE — Progress Notes (Addendum)
 Occupational Therapy Session Note  Patient Details  Name: JAMARI MOTEN MRN: [id number] Date of Birth: 31-May-1945  Today's Date: 05/09/2024 OT Individual Time: 1015-1100 OT Individual Time Calculation (min): 45 min    Short Term Goals: Week 1:  OT Short Term Goal 1 (Week 1): patient will complete toileting / toileting transfer CGA with increased attention to the R with min verbal cueing OT Short Term Goal 1 - Progress (Week 1): Met OT Short Term Goal 2 (Week 1): patient will complete grooming standing at sink with SUP to increase dynamic balance OT Short Term Goal 2 - Progress (Week 1): Met OT Short Term Goal 3 (Week 1): patient will complete LB dressing CGA OT Short Term Goal 3 - Progress (Week 1): Met Week 2:  OT Short Term Goal 1 (Week 2): STG =LTG d.t ELOS OT Short Term Goal 1 - Progress (Week 2): Progressing toward goal  Skilled Therapeutic Interventions/Progress Updates:    1:1 Pt received in the bed with binder and TEDS donned. Donned bilateral ACE wraps up to the thighs (2 on each LE). BP taken and was 104/59 (73) in bed with HOB elevated. Pt came to EOB, off left side of bed, and was dizzy. BP 85/53 (78). Pt sat for 2 min and BP taken again and then was 103/61. Then tried to stand to take a BP but unable to stand to get BP but when sat BP was 76/54. Pt endorses heavy breathing and dizziness that is conducive of activity. Got a TIS chair to promote out of bed activity in a tolerable position. Pt min A transfer stand pivot into chair.   Pt handed off to next therapist.   Therapy Documentation Precautions:  Precautions Precautions: Fall Recall of Precautions/Restrictions: Impaired Precaution/Restrictions Comments: RLE weakness, RUE weakness Restrictions Weight Bearing Restrictions Per Provider Order: No  Pain:  No c/o pain   Therapy/Group: Individual Therapy  Claudene Nest [facility] 05/09/2024, 3:24 PM

## 2024-05-09 NOTE — Progress Notes (Addendum)
 "  Progress Note  Patient Name: Christopher Roberts Date of Encounter: 05/09/2024 [facility] Cardiologist: None   Interval Summary   Patient sitting up in gym and working with OT.  Patient denies any chest pain, shortness of breath, abdominal distention, lower extremity edema.  Patient gets lightheaded when standing.  Occupational therapist reported that patient is currently unable to stand because of how lightheaded he gets.   Vital Signs Vitals:   05/09/24 0901 05/09/24 0909 05/09/24 0911 05/09/24 0943  BP: (!) 92/49 (!) 79/58 (!) 67/38 (!) 113/55  Pulse: 67 67 77 75  Resp: 18 20 20 18   Temp:      TempSrc:      SpO2: 98% 97% 97% 95%  Weight:      Height:        Intake/Output Summary (Last 24 hours) at 05/09/2024 1044 Last data filed at 05/09/2024 0900 Gross per 24 hour  Intake 375 ml  Output 950 ml  Net -575 ml      05/09/2024    2:05 AM 04/28/2024    5:46 AM 04/25/2024    2:05 AM  Last 3 Weights  Weight (lbs) 166 lb 10.7 oz 168 lb 14 oz 159 lb 13.3 oz  Weight (kg) 75.6 kg 76.6 kg 72.5 kg      Patient is currently in rehab and not on telemetry  Physical Exam  GEN: No acute distress.   Neck: No JVD Cardiac: RRR, no murmurs, rubs, or gallops.  Respiratory: Clear to auscultation bilaterally. GI: Soft, nontender, non-distended  MS: No edema  Assessment & Plan   CAD s/p remote history of CABG with LIMA to LAD, and inferior STEMI in 2022 Hyperlipidemia High-sensitivity troponin 32 > 47 > 64.  No further ischemic evaluation necessary at this time. Coreg  was restarted but was later stopped because of soft blood pressures and orthostatic hypotension.   Orthostatic hypotension Orthostatic vitals yesterday morning were 99/58 lying, 92/49 sitting, 79/58 and 67/38 standing.  These orthostatic vital signs were measured while patient was wearing compression socks, abdominal binder, and Ace wrap's. Recommend considering starting midodrine  2.5mg  3 times daily to see if this  helps the patient's orthostatic hypotension.   Carotid artery disease Underwent TCAR to left carotid on 04/21/2024.  Planning to get a TCAR to the right side at a later date  CVA Patient was hospitalized for stroke.   Otherwise management per primary    For questions or updates, please contact [facility] Please consult [email or web address] for contact info under   Signed, Morse Clause, PA-C    History and all data above reviewed.  I personally took the history today, performed the physical exam and formulated the substantive portion of the assessment and plan.  I reviewed all relevant tests and studies. Patient examined.  I agree with the findings as above.  The patient has no acute complaints.  He reports that he was very dizzy on standing with PT/OT today.  He denies chest pain for the last 48 hours.   The patient exam reveals COR:RRR  ,  Lungs:   ,  Abd: Positive bowel sounds, no rebound no guarding , Ext No edema  .  All available labs, radiology testing, previous records reviewed. Agree with documented assessment and plan.   Orthostatic hypotension:   Given the fact that the BP is low we will stop the Coreg  and restart midodrine .  Thankfully he is not having further chest pain.   No plan for ischemia work  up this admission.    Lynwood Cyd Hostler  12:45 PM  05/09/2024 "

## 2024-05-09 NOTE — Progress Notes (Signed)
 "                                                        PROGRESS NOTE   Subjective/Complaints:  Now with foley cath , urine is now clear , discussed voiding trial but pt would like to hold off for now  No further CP, appreciate cardiology assist   ROS: as per HPI. Denies CP, SOB, abd pain, N/V/D/C, or any other complaints at this time.    Objective:   No results found.  Recent Labs    05/08/24 0608 05/09/24 0523  WBC 2.4*  --   HGB 7.6* 7.5*  HCT 22.1* 21.9*  PLT 43*  --    Recent Labs    05/08/24 0608 05/09/24 0523  NA 137 138  K 4.0 4.0  CL 108 108  CO2 20* 21*  GLUCOSE 121* 136*  BUN 30* 28*  CREATININE 1.06 1.13  CALCIUM  8.1* 7.8*    Intake/Output Summary (Last 24 hours) at 05/09/2024 0742 Last data filed at 05/09/2024 9388 Gross per 24 hour  Intake 250 ml  Output 850 ml  Net -600 ml        Physical Exam: Vital Signs Blood pressure (!) 109/54, pulse 74, temperature 98.6 F (37 C), resp. rate 18, height 5' 10 (1.778 m), weight 75.6 kg, SpO2 96%.   General: No acute distress, sitting up in w/c, looks a little tired.  Mood and affect are appropriate, a little flat Heart: Regular rate and rhythm no rubs murmurs or extra sounds Lungs: Clear to auscultation, breathing unlabored, no rales or wheezes Abdomen: Positive bowel sounds, soft nontender to palpation, nondistended Extremities: No clubbing, cyanosis, or edema Skin: No evidence of breakdown, no evidence of rash to exposed surfaces, scattered bruises, L cartoid surgical incision remains c/d/I  PRIOR EXAMS: Neurologic: Cranial nerves II through XII intact, motor strength is 5/5 in left UE/LE and 5/5 right deltoid, bicep, tricep, grip, 4/5 hip flexor, knee extensors, ankle dorsiflexor and plantar flexor Sensory exam normal sensation to light touch  in bilateral upper and left lower lower extremities Reduced LT RLE Oriented to person and place, month, day Cerebellar exam mild dysmetria Right finger nose  finger  Musculoskeletal: Full range of motion in all 4 extremities. No joint swelling    Assessment/Plan: 1. Functional deficits which require 3+ hours per day of interdisciplinary therapy in a comprehensive inpatient rehab setting. Physiatrist is providing close team supervision and 24 hour management of active medical problems listed below. Physiatrist and rehab team continue to assess barriers to discharge/monitor patient progress toward functional and medical goals  Care Tool:  Bathing    Body parts bathed by patient: Right arm, Left upper leg, Left arm, Right lower leg, Chest, Left lower leg, Abdomen, Face, Front perineal area, Buttocks, Right upper leg         Bathing assist Assist Level: Supervision/Verbal cueing     Upper Body Dressing/Undressing Upper body dressing   What is the patient wearing?: Pull over shirt    Upper body assist Assist Level: Supervision/Verbal cueing    Lower Body Dressing/Undressing Lower body dressing      What is the patient wearing?: Underwear/pull up, Pants     Lower body assist Assist for lower body dressing: Contact Guard/Touching assist     Toileting Toileting  Toileting assist Assist for toileting: Maximal Assistance - Patient 25 - 49%     Transfers Chair/bed transfer  Transfers assist     Chair/bed transfer assist level: Contact Guard/Touching assist     Locomotion Ambulation   Ambulation assist      Assist level: Contact Guard/Touching assist Assistive device: Walker-rolling Max distance: 100 feet   Walk 10 feet activity   Assist     Assist level: Contact Guard/Touching assist Assistive device: Walker-rolling   Walk 50 feet activity   Assist Walk 50 feet with 2 turns activity did not occur: Safety/medical concerns  Assist level: Contact Guard/Touching assist Assistive device: Walker-rolling    Walk 150 feet activity   Assist Walk 150 feet activity did not occur: Safety/medical concerns  (fatigue/SOB)         Walk 10 feet on uneven surface  activity   Assist Walk 10 feet on uneven surfaces activity did not occur: Safety/medical concerns         Wheelchair     Assist Is the patient using a wheelchair?: Yes Type of Wheelchair: Manual    Wheelchair assist level: Dependent - Patient 0% Max wheelchair distance: 15 ft    Wheelchair 50 feet with 2 turns activity    Assist        Assist Level: Dependent - Patient 0%   Wheelchair 150 feet activity     Assist      Assist Level: Dependent - Patient 0%   Blood pressure (!) 109/54, pulse 74, temperature 98.6 F (37 C), resp. rate 18, height 5' 10 (1.778 m), weight 75.6 kg, SpO2 96%.  Medical Problem List and Plan: 1. 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  Antithrombotics: -DVT/anticoagulation:  Mechanical: Antiembolism stockings, thigh (TED hose) Bilateral lower extremities             -antiplatelet therapy: Aspirin  81 mg daily and Plavix  75 mg daily  3. Pain Management: Tylenol  as needed  4. Mood/Behavior/Sleep: Prozac  20 mg daily.             -antipsychotic agents: N/A  -melatonin prn  5. Neuropsych/cognition: This patient is capable of making decisions on his own behalf.  6. Skin/Wound Care: Routine skin checks  7. Fluids/Electrolytes/Nutrition: Routine and analysis with follow-up chemistries  8.  Symptomatic left carotid artery stenosis.  Status post stenting/angioplasty 04/21/2024 per Dr. Gretta  9.  Hypotension: off ProAmatine  10 mg 3 times daily.  Monitor with increased mobility  Stopped by Cardiology due to CP  Vitals:    05/07/24 1025 05/07/24 1054 05/07/24 1139 05/07/24 1345  BP: (!) 79/47 104/60 122/71 121/70   05/07/24 2032 05/08/24 0458 05/08/24 0622 05/08/24 1341  BP: 107/61 (!) 101/55 (!) 100/54 112/69   05/08/24 1830 05/08/24 2001 05/09/24 0556 05/09/24 0557  BP: 115/67 136/66 (!) 109/54 (!) 109/54    10.  Diabetes mellitus.  Hemoglobin A1c 5.6.  Currently on SSI.  Prior to admission patient on Glucophage  500  mg daily.  Resume as needed CBGs ok if they start climbing above 180 will restart metformin   -12/29 controlled   CBG (last 3)  Recent Labs    05/08/24 1737 05/08/24 2047 05/09/24 0620  GLUCAP 253* 154* 128*    11.  Hyperlipidemia: continue Lipitor  80mg  daily  12.  History of CAD/PCI/CABG/SVT.  Follow-up [name] outpatient  13.  Pancytopenia.  Heme oncology Dr.Pasam outpatient.  Follow-up CBC -No heparin  for DVT prophyllaxis, monitor for bleeding on DAPT transfuse if PLT<20K or Hgb <7.0     Latest Ref Rng & Units 05/09/2024    5:23 AM 05/08/2024    6:08 AM 05/05/2024    5:22 AM  CBC  WBC 4.0 - 10.5 K/uL  2.4  3.1   Hemoglobin 13.0 - 17.0 g/dL 7.5  7.6  8.9   Hematocrit 39.0 - 52.0 % 21.9  22.1  26.0   Platelets 150 - 400 K/uL  43  61   Hgb stable  foley bag light pink tinge, no clots, some of the drop in Hgb is dilutional due to rehydration with IVF   14.  Cirrhosis of liver.  Imaging suggestive of cirrhosis.  Bili mildly elevated as well as INR 1.3.  Follow-up outpatient  15.  Urinary retention.  In-N-Out catheterization 12/13 x 1.  Placed on Flomax . Messaged nursing to discuss her PVRs. -04/27/24 flomax  d/c'd yesterday, no PVRs documented, will discuss with nursing-- ordered PVRs -12/25 PVRs variable. Had to be cathed once yesterday for pvr of 350.   -continue PVR's for now -05/03/24 only a few PVRs documented, all low, no caths; spiked fever last night, checking labs/urine/cxr as below. -05/04/24 labs yesterday stable/ok, but U/A looking iffy with cloudy urine,  trace leuks, 21-50 WBC though no bacteria-- however pt not feeling well today and having incontinence, highly suspect UTI-- STARTING Keflex  500mg  TID x7d, UCx  Proteus mirabilis , >100K S to Keflex   Restart flomax  as PVRs are now up , no anticholinergic meds identified - monitor BPs on Flomax  which was held due to low BPs   16.  Constipation: continue Colace 100 mg daily -05/04/24 LBM this AM.   17. Fever due to UTI resolved  18.  Pre renal azotemia - IVF at bedtime but change to .9NS recheck BMPstable, BUN still up cont IVF for now     Latest Ref Rng & Units 05/09/2024    5:23 AM 05/08/2024    6:08 AM 05/05/2024    5:22 AM  BMP  Glucose 70 - 99 mg/dL 863  878  858   BUN 8 - 23 mg/dL 28  30  46   Creatinine 0.61 - 1.24 mg/dL 8.86  8.93  8.72   Sodium 135 - 145 mmol/L 138  137  137   Potassium 3.5 - 5.1 mmol/L 4.0  4.0  4.0   Chloride 98 - 111 mmol/L 108  108  106   CO2 22 - 32 mmol/L 21  20  20    Calcium  8.9 - 10.3 mg/dL 7.8  8.1  8.7      19.  Angina- no evidence of MI, now on Coreg  and off midodrine , concerns about hypotension monitor closely   LOS: 15 days A FACE TO FACE EVALUATION WAS PERFORMED  Prentice FORBES Compton 05/09/2024, 7:42 AM     "

## 2024-05-09 NOTE — Progress Notes (Signed)
 Occupational Therapy Weekly Progress Note  Patient Details  Name: Christopher Roberts MRN: [id number] Date of Birth: Jun 24, 1945  Beginning of progress report period: May 02, 2024 End of progress report period: May 10, 2023  Today's Date: 05/09/2024 OT Individual Time: 1100-1159 OT Individual Time Calculation (min): 59 min    Patient has met 0 of 1 short term goals as STGs were not written based on Pt's ELOS. Pt LOS extended d/t medical changes with increased orthostatic hypotension and increased levels of fatigue. Pt is demonstrating challenges tolerating upright seated and standing position requiring use of thigh high TEDs, ACE wraps on B LEs, and abdominal binder. He can complete UB ADLs seated with SUP and LB ADLs MIN A overall.   Patient continues to demonstrate the following deficits: muscle weakness, decreased cardiorespiratoy endurance, central origin, and decreased sitting balance, decreased standing balance, decreased postural control, and decreased balance strategies and therefore will continue to benefit from skilled OT intervention to enhance overall performance with BADL and Reduce care partner burden.  Patient progressing toward long term goals..  Continue plan of care.  OT Short Term Goals Week 2:  OT Short Term Goal 1 (Week 2): STG =LTG d.t ELOS OT Short Term Goal 1 - Progress (Week 2): Progressing toward goal Week 3:  OT Short Term Goal 1 (Week 3): STG=LTG d/t ELOS  Skilled Therapeutic Interventions/Progress Updates:     Pt handed off to this OT in therapy gym with Pt dressed for the day with abdominal binder, thigh high TEDS, and ACE wraps donned. Pt continuing to present with orthostatic hypotension and is symptomatic, significantly limiting his ability to tolerate upright seated and standing position. Pt presenting to be in good spirits receptive to skilled OT session reporting 0/10 pain- OT offering intermittent rest breaks, repositioning, and therapeutic support to  optimize participation in therapy session. Focused session on BP management, upright tolerance, and endurance training.   Pt participated in the following seated exercises to increase overall endurance and strength for ADLs and promote increased BP:   -Bicep curls, 3x10 reps, 3# weighted dowel  -Chest press,  3x10 reps, 3# weighted dowel  -Anterior shoulder flexion,  3x10 reps, 3# weighted dowel  -Alternating forwards punches,  3x10 reps, 2# wrist weights  -Seated alternating marches, 3x10 reps, 2# ankle weights  -Seated knee flexion/extension, 3x10 reps, 2# ankle weights  -Heel raises, 3x10 reps  Vitals following exercise 1st set: HR 76 BP 104/63(75) SPO2 100% Vitals following exercise 2nd set: HR 79 BP 101/59(74) SPO2 100%  Pt completed B UE endurance training on NuStep arm bike with Pt able to complete 3 bouts of 3 min (forwards then backwards) with rest break following.   Transported Pt back to room in wc. Pt was left resting in TISWC with call bell in reach, seatbelt alarm on, and all needs met.    Therapy Documentation Precautions:  Precautions Precautions: Fall Recall of Precautions/Restrictions: Impaired Precaution/Restrictions Comments: RLE weakness, RUE weakness Restrictions Weight Bearing Restrictions Per Provider Order: No   Therapy/Group: Individual Therapy  Katheryn SHAUNNA [place] 05/09/2024, 7:33 AM

## 2024-05-09 NOTE — Progress Notes (Addendum)
 Speech Language Pathology Daily Session Note  Patient Details  Name: Christopher Roberts MRN: [id number] Date of Birth: 1946/05/02  Today's Date: 05/09/2024 SLP Individual Time: 9262-9183 SLP Individual Time Calculation (min): 39 min  Short Term Goals: Week 2: SLP Short Term Goal 1 (Week 2): STGs = LTGs d/t ELOS  Skilled Therapeutic Interventions:   Pt greeted at bedside for tx targeting cognition. SLP facilitated orientation review and he was oriented to date w/ only supervisionA. SLP facilitated conversational task targeting functional recall and he was able to recall events of the last 24 hours w/ minA for accuracy. He also completed a written decoding task targeting sustained attention and functional problem solving w/ only supervisionA assist overall. He was able to sustain attention independently but required supervision for attention to detail and problem solving. At the end of tx tasks, he was left in bed w/ the alarm set and call light within reach. Recommend cont ST per POC.    Pain  No pain reported  Therapy/Group: Individual Therapy  Christopher Roberts 05/09/2024, 7:45 AM

## 2024-05-09 NOTE — Plan of Care (Signed)
" °  Problem: Consults Goal: RH STROKE PATIENT EDUCATION Description: See Patient Education module for education specifics  Outcome: Progressing Goal: Diabetes Guidelines if Diabetic/Glucose > 140 Description: If diabetic or lab glucose is > 140 mg/dl - Initiate Diabetes/Hyperglycemia Guidelines & Document Interventions  Outcome: Progressing   Problem: RH BOWEL ELIMINATION Goal: RH STG MANAGE BOWEL WITH ASSISTANCE Description: STG Manage Bowel with mod I Assistance. Outcome: Progressing Goal: RH STG MANAGE BOWEL W/MEDICATION W/ASSISTANCE Description: STG Manage Bowel with Medication with mod I Assistance. Outcome: Progressing   Problem: RH BLADDER ELIMINATION Goal: RH STG MANAGE BLADDER WITH ASSISTANCE Description: STG Manage Bladder With mod I Assistance Outcome: Progressing Goal: RH STG MANAGE BLADDER WITH MEDICATION WITH ASSISTANCE Description: STG Manage Bladder With Medication With mod I Assistance. Outcome: Progressing   Problem: RH KNOWLEDGE DEFICIT Goal: RH STG INCREASE KNOWLEDGE OF DIABETES Description: Patient and spouse will be able to manage DM using educational resources for medications and dietary modification independently Outcome: Progressing Goal: RH STG INCREASE KNOWLEDGE OF HYPERTENSION Description: Patient and spouse will be able to manage HTN using educational resources for medications and dietary modification independently Outcome: Progressing Goal: RH STG INCREASE KNOWLEDGE OF DYSPHAGIA/FLUID INTAKE Description: Patient and spouse will be able to manage Dysphagia using educational resources for medications and dietary modification independently Outcome: Progressing Goal: RH STG INCREASE KNOWLEGDE OF HYPERLIPIDEMIA Description: Patient and spouse will be able to manage HLD using educational resources for medications and dietary modification independently Outcome: Progressing Goal: RH STG INCREASE KNOWLEDGE OF STROKE PROPHYLAXIS Description: Patient and spouse  will be able to manage secondary risks using educational resources for medications and dietary modification independently Outcome: Progressing   "

## 2024-05-09 NOTE — Progress Notes (Signed)
 Physical Therapy Session Note  Patient Details  Name: Christopher Roberts MRN: [id number] Date of Birth: 1946-03-27  Today's Date: 05/09/2024 PT Individual Time: 1419-1520 PT Individual Time Calculation (min): 61 min   Short Term Goals: Week 1:  PT Short Term Goal 1 (Week 1): Pt will perform bed mobility with Mod I and improved initiation/ effort. PT Short Term Goal 1 - Progress (Week 1): Met PT Short Term Goal 2 (Week 1): Pt will perform sit<>stand transfers with overall CGA to no AD. PT Short Term Goal 2 - Progress (Week 1): Met PT Short Term Goal 3 (Week 1): Pt will perform stand pivot transfers with overall CGA using LRAD. PT Short Term Goal 3 - Progress (Week 1): Met PT Short Term Goal 4 (Week 1): Pt will ambulate at least 40 ft using LRAD with CGA. PT Short Term Goal 4 - Progress (Week 1): Met PT Short Term Goal 5 (Week 1): Pt will complete stair navigation training. PT Short Term Goal 5 - Progress (Week 1): Met Week 2:  PT Short Term Goal 1 (Week 2): STG = LTG 2/2 LOS   Skilled Therapeutic Interventions/Progress Updates:  Patient seated upright in TIS w/c on entrance to room. Patient alert and agreeable to PT session.   Patient with no pain complaint at start of session.  Pt's abdominal binder has moved upward to chest and compressing on ribcage d/t improper fit. Msg sent to ortho tech team for larger binder. Current binder doffed. BLE with TEDs and short stretch wraps already applied.   Blood pressures: - seated in room with no abd binder and sweatshirt donned: 145/76, pulse 77 - seated in TIS w/c in day room just prior to ambulation with no abdominal binder: 119/ 69 (82), pulse 85 - just sitting after first short ambulation bout, no abd binder: 105/ 68 (79), pulse 90 - just sitting after second ambulation bout, no abd binder: 97/ 56 (69), pulse 90    Therapeutic Activity: Transfers: Pt performed sit<>stand and stand pivot transfers throughout session with overall supervision  but one instance of CGA d/t weakness/ fatigue. Provided vc/ tc for foot positioning and anterior weight shift.  Gait Training:  Pt ambulated 6 ft using RW with CGA and close w/c follow from wife. Requested to sit d/t fatigue and weakness. After rest, pt ambulated again to 15 ft distance using RW with CGA and close w/c follow. Throughout, pt demo'd decreased step height/ length, decreased pace and flexed posture. Provided vc/ tc for posture.  Patient assisted to bed using bedrail and stand pivot with CGA at end of session with brakes locked, bed alarm set, and all needs within reach. RN present for administering of meds.    Therapy Documentation Precautions:  Precautions Precautions: Fall Recall of Precautions/Restrictions: Impaired Precaution/Restrictions Comments: RLE weakness, RUE weakness Restrictions Weight Bearing Restrictions Per Provider Order: No  Vital Signs: Pulse Rate: 83 Resp: 18 BP: 127/66 Patient Position (if appropriate): Lying about an hour after session and no compression donned.   Pain:  No pain related this session. Pt only complains of weakness/ fatigue.   Therapy/Group: Individual Therapy  Mliss DELENA Milliner PT, DPT, CSRS 05/09/2024, 6:12 PM

## 2024-05-10 ENCOUNTER — Other Ambulatory Visit (HOSPITAL_COMMUNITY): Payer: Self-pay

## 2024-05-10 DIAGNOSIS — I2510 Atherosclerotic heart disease of native coronary artery without angina pectoris: Secondary | ICD-10-CM

## 2024-05-10 DIAGNOSIS — E7800 Pure hypercholesterolemia, unspecified: Secondary | ICD-10-CM

## 2024-05-10 DIAGNOSIS — I6523 Occlusion and stenosis of bilateral carotid arteries: Secondary | ICD-10-CM

## 2024-05-10 DIAGNOSIS — I63512 Cerebral infarction due to unspecified occlusion or stenosis of left middle cerebral artery: Secondary | ICD-10-CM

## 2024-05-10 DIAGNOSIS — I2583 Coronary atherosclerosis due to lipid rich plaque: Secondary | ICD-10-CM

## 2024-05-10 DIAGNOSIS — I951 Orthostatic hypotension: Secondary | ICD-10-CM

## 2024-05-10 LAB — GLUCOSE, CAPILLARY
Glucose-Capillary: 133 mg/dL — ABNORMAL HIGH (ref 70–99)
Glucose-Capillary: 204 mg/dL — ABNORMAL HIGH (ref 70–99)
Glucose-Capillary: 178 mg/dL — ABNORMAL HIGH (ref 70–99)
Glucose-Capillary: 152 mg/dL — ABNORMAL HIGH (ref 70–99)

## 2024-05-10 MED ORDER — MIDODRINE HCL 5 MG PO TABS
5.0000 mg | ORAL_TABLET | Freq: Three times a day (TID) | ORAL | Status: DC
  Administered 2024-05-10 – 2024-05-14 (×12): 5 mg via ORAL
  Filled 2024-05-10 (×12): qty 1

## 2024-05-10 NOTE — Progress Notes (Signed)
 Physical Therapy Session Note  Patient Details  Name: Christopher Roberts MRN: [id number] Date of Birth: 1945-07-01  Today's Date: 05/10/2024 PT Individual Time: 0912-1017 PT Individual Time Calculation (min): 65 min   Short Term Goals: Week 1:  PT Short Term Goal 1 (Week 1): Pt will perform bed mobility with Mod I and improved initiation/ effort. PT Short Term Goal 1 - Progress (Week 1): Met PT Short Term Goal 2 (Week 1): Pt will perform sit<>stand transfers with overall CGA to no AD. PT Short Term Goal 2 - Progress (Week 1): Met PT Short Term Goal 3 (Week 1): Pt will perform stand pivot transfers with overall CGA using LRAD. PT Short Term Goal 3 - Progress (Week 1): Met PT Short Term Goal 4 (Week 1): Pt will ambulate at least 40 ft using LRAD with CGA. PT Short Term Goal 4 - Progress (Week 1): Met PT Short Term Goal 5 (Week 1): Pt will complete stair navigation training. PT Short Term Goal 5 - Progress (Week 1): Met Week 2:  PT Short Term Goal 1 (Week 2): STG = LTG 2/2 LOS  Skilled Therapeutic Interventions/Progress Updates:  Patient supine in bed and asleep with room dark on entrance to room. Patient  easily roused with turning on of overhead light and then alert and agreeable to PT session with time to fully wake.   Patient with no pain complaint at start of session.  New abdominal binder had been delivered to room and is correct size. Pt requests to keep current clothes on so thigh high TED hose donned in supine, then pt requires MinA and vc for technique to push up to seated position from sidelying. Cued to scoot forward to EOB and pt demos lightheadedness. With brief time improves. Abdominal binder donned in sitting. Rises to stand with SBA/ CGA to RW. Endorses lightheadedness and requests to sit. Dynamap obtained and seated BP listed below.  Pt guided in ambulation to TIS w/c located 6 feet away. Is able to reach w/c with MinA/ CGA and minimal lightheadedness using RW. Blood pressure  taken immediately upon sitting and listed below - of note, systolic BP drops 20 points.   After seated rest and improvement in lightheadedness, pt's w/c taken into hallway and he is agreeable to attempt ambulation over double distance. Pt able to rise to stand with SBA to RW and ambulates 12 feet with slow pace and increase of respiratory rate. He is able to reach distance, then requests to sit. BP again taken immediately upon sitting. Respiratory rate noted as 42 breaths/ min. With seated rest, resp rate decreases quickly. No lightheadedness indicated by pt.   Cardiologist enters room to discuss pt's current BP issues and medications. Discussed current BP measurements.   Patient seated upright in TIS w/c in full upright position at end of session with brakes locked, belt alarm set, and all needs within reach.  Therapy Documentation Precautions:  Precautions Precautions: Fall Recall of Precautions/Restrictions: Impaired Precaution/Restrictions Comments: RLE weakness, RUE weakness Restrictions Weight Bearing Restrictions Per Provider Order: No  Vital Signs: Therapy Vitals Patient Position (if appropriate): Orthostatic Vitals  ORTHOSTATIC VITAL SIGNS (abdominal binder and thigh high TED hose donned):  BP taken in SITTING on EOB after : 96/73 (80), pulse 86 BP taken in SITTING immediately following ambulation 6 feet: 76/53 (62), pulse 85 BP taken in SITTING immediately following ambulation 12 feet: 90/60 (71), pulse 87 (resp rate = 42bpm)  Pain: Pain Assessment Pain Scale: 0-10 Pain Score: 0-No pain  Therapy/Group: Individual Therapy  Mliss DELENA Milliner PT, DPT, CSRS 05/10/2024, 11:48 AM

## 2024-05-10 NOTE — Progress Notes (Signed)
"  °  Progress Note  Patient Name: Christopher Roberts Date of Encounter: 05/10/2024 [facility] Cardiologist: None   Interval Summary   Up in chair working with PT.  Currently has abdominal binder and thigh high compression hose on.   PT did orthostatics: Sitting 96/35mmHg Standing 76/74mmHg Walking in hall 90/56mmHg Denies any CP Was SOB with walking in hall  Vital Signs Vitals:   05/09/24 0943 05/09/24 1803 05/09/24 1940 05/10/24 0549  BP: (!) 113/55 127/66 123/69 (!) 104/57  Pulse: 75 83 83 78  Resp: 18 18 19 18   Temp:  99.4 F (37.4 C) 99.7 F (37.6 C) 98.9 F (37.2 C)  TempSrc:   Oral Oral  SpO2: 95% 95% 98% 97%  Weight:      Height:        Intake/Output Summary (Last 24 hours) at 05/10/2024 1003 Last data filed at 05/10/2024 0900 Gross per 24 hour  Intake 596 ml  Output 900 ml  Net -304 ml      05/09/2024    2:05 AM 04/28/2024    5:46 AM 04/25/2024    2:05 AM  Last 3 Weights  Weight (lbs) 166 lb 10.7 oz 168 lb 14 oz 159 lb 13.3 oz  Weight (kg) 75.6 kg 76.6 kg 72.5 kg      Patient is currently in rehab and not on telemetry  Physical Exam  GEN: Well nourished, well developed in no acute distress HEENT: Normal NECK: No JVD; No carotid bruits LYMPHATICS: No lymphadenopathy CARDIAC:RRR, no murmurs, rubs, gallops RESPIRATORY:  Clear to auscultation without rales, wheezing or rhonchi  ABDOMEN: Soft, non-tender, non-distended MUSCULOSKELETAL:  No edema; No deformity  SKIN: Warm and dry NEUROLOGIC:  Alert and oriented x 3 PSYCHIATRIC:  Normal affect   Assessment & Plan   CAD s/p remote history of CABG with LIMA to LAD, and inferior STEMI in 2022 Hyperlipidemia -High-sensitivity troponin 32 > 47 > 64.  Not c/w ACS -No further ischemic evaluation necessary at this time. -continue ASA 81mg  daily, Atorvastatin  80mg  daily, Plavix  75mg  daily -Coreg  was restarted but was later stopped because of soft blood pressures and orthostatic hypotension.  Orthostatic  hypotension Orthostatic vitals yesterday morning were 99/58 lying, 92/49 sitting, 79/58 and 67/38 standing.  These orthostatic vital signs were measured while patient was wearing compression socks, abdominal binder, and Ace wrap's. - started on Midodrine  2.5mg  TID - repeat orthostatic BPs today with abdominal binder and thigh high TED hose shows persistent orthostatic drops in SPB to -will increase Midodrine  to 5mg  TID (times to administer 7AM, 12 Noon  and 4PM  Carotid artery disease -Underwent TCAR to left carotid on 04/21/2024.   -Planning to get a TCAR to the right side at a later date  CVA Patient was hospitalized for stroke.   Otherwise management per primary    For questions or updates, please contact [facility] Please consult [email or web address] for contact info under   Signed, Wilbert Bihari, MD  10:03 AM  05/10/2024 "

## 2024-05-10 NOTE — Progress Notes (Addendum)
 "                                                        PROGRESS NOTE   Subjective/Complaints:  Pt doing alright, still orthostatic with PT but doing ok. Slept well, denies pain, LBM yesterday per pt though not documented. Has foley now, no issues with it, wants to leave it in for now.  Cardiology following.   ROS: as per HPI. Denies CP, SOB, abd pain, N/V/D/C, or any other complaints at this time.    Objective:   No results found.  Recent Labs    05/08/24 0608 05/09/24 0523  WBC 2.4*  --   HGB 7.6* 7.5*  HCT 22.1* 21.9*  PLT 43*  --    Recent Labs    05/08/24 0608 05/09/24 0523  NA 137 138  K 4.0 4.0  CL 108 108  CO2 20* 21*  GLUCOSE 121* 136*  BUN 30* 28*  CREATININE 1.06 1.13  CALCIUM  8.1* 7.8*    Intake/Output Summary (Last 24 hours) at 05/10/2024 1109 Last data filed at 05/10/2024 0900 Gross per 24 hour  Intake 596 ml  Output 900 ml  Net -304 ml        Physical Exam: Vital Signs Blood pressure (!) 104/57, pulse 78, temperature 98.9 F (37.2 C), temperature source Oral, resp. rate 18, height 5' 10 (1.778 m), weight 75.6 kg, SpO2 97%.   General: No acute distress, sitting up in w/c with PT.  Mood and affect are appropriate, a little flat Heart: Regular rate and rhythm no rubs murmurs or extra sounds Lungs: Clear to auscultation, breathing unlabored, no rales or wheezes Abdomen: Positive bowel sounds, soft nontender to palpation, nondistended Extremities: No clubbing, cyanosis, or edema Skin: No evidence of breakdown, no evidence of rash to exposed surfaces, scattered bruises, L cartoid surgical incision remains c/d/I  PRIOR EXAMS: Neurologic: Cranial nerves II through XII intact, motor strength is 5/5 in left UE/LE and 5/5 right deltoid, bicep, tricep, grip, 4/5 hip flexor, knee extensors, ankle dorsiflexor and plantar flexor Sensory exam normal sensation to light touch  in bilateral upper and left lower lower extremities Reduced LT RLE Oriented to  person and place, month, day Cerebellar exam mild dysmetria Right finger nose finger  Musculoskeletal: Full range of motion in all 4 extremities. No joint swelling    Assessment/Plan: 1. Functional deficits which require 3+ hours per day of interdisciplinary therapy in a comprehensive inpatient rehab setting. Physiatrist is providing close team supervision and 24 hour management of active medical problems listed below. Physiatrist and rehab team continue to assess barriers to discharge/monitor patient progress toward functional and medical goals  Care Tool:  Bathing    Body parts bathed by patient: Right arm, Left upper leg, Left arm, Right lower leg, Chest, Left lower leg, Abdomen, Face, Front perineal area, Buttocks, Right upper leg         Bathing assist Assist Level: Supervision/Verbal cueing     Upper Body Dressing/Undressing Upper body dressing   What is the patient wearing?: Pull over shirt    Upper body assist Assist Level: Supervision/Verbal cueing    Lower Body Dressing/Undressing Lower body dressing      What is the patient wearing?: Underwear/pull up, Pants     Lower body assist Assist for lower body dressing:  Contact Guard/Touching assist     Toileting Toileting    Toileting assist Assist for toileting: Maximal Assistance - Patient 25 - 49%     Transfers Chair/bed transfer  Transfers assist     Chair/bed transfer assist level: Contact Guard/Touching assist     Locomotion Ambulation   Ambulation assist      Assist level: Contact Guard/Touching assist Assistive device: Walker-rolling Max distance: 100 feet   Walk 10 feet activity   Assist     Assist level: Contact Guard/Touching assist Assistive device: Walker-rolling   Walk 50 feet activity   Assist Walk 50 feet with 2 turns activity did not occur: Safety/medical concerns  Assist level: Contact Guard/Touching assist Assistive device: Walker-rolling    Walk 150 feet  activity   Assist Walk 150 feet activity did not occur: Safety/medical concerns (fatigue/SOB)         Walk 10 feet on uneven surface  activity   Assist Walk 10 feet on uneven surfaces activity did not occur: Safety/medical concerns         Wheelchair     Assist Is the patient using a wheelchair?: Yes Type of Wheelchair: Manual    Wheelchair assist level: Dependent - Patient 0% Max wheelchair distance: 15 ft    Wheelchair 50 feet with 2 turns activity    Assist        Assist Level: Dependent - Patient 0%   Wheelchair 150 feet activity     Assist      Assist Level: Dependent - Patient 0%   Blood pressure (!) 104/57, pulse 78, temperature 98.9 F (37.2 C), temperature source Oral, resp. rate 18, height 5' 10 (1.778 m), weight 75.6 kg, SpO2 97%.  Medical Problem List and Plan: 1. 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  Antithrombotics: -DVT/anticoagulation:  Mechanical: Antiembolism stockings, thigh (TED hose) Bilateral lower extremities             -antiplatelet therapy: Aspirin  81 mg daily and Plavix  75 mg daily  3. Pain Management: Tylenol  as needed  4. Mood/Behavior/Sleep: Prozac  20 mg daily.             -antipsychotic agents: N/A  -melatonin prn  5. Neuropsych/cognition: This patient is capable of making decisions on his own behalf.  6. Skin/Wound Care: Routine skin checks  7. Fluids/Electrolytes/Nutrition: Routine and analysis with follow-up chemistries  8.  Symptomatic left carotid artery stenosis.  Status post stenting/angioplasty 04/21/2024 per Dr. Gretta  9.  Hypotension: off  ProAmatine  10 mg 3 times daily.  Monitor with increased mobility -05/10/24 BPs soft and orthostatic, midodrine  restarted at 2.5mg  TID-- increased today to 5mg  TID-- appreciate their assistance.  Vitals:   05/08/24 1830 05/08/24 2001 05/09/24 0556 05/09/24 0557  BP: 115/67 136/66 (!) 109/54 (!) 109/54   05/09/24 0859 05/09/24 0901 05/09/24 0909 05/09/24 0911  BP: (!) 99/58 (!) 92/49 (!) 79/58 (!) 67/38   05/09/24 0943 05/09/24 1803 05/09/24 1940 05/10/24 0549  BP: (!) 113/55 127/66 123/69 (!) 104/57    10.  Diabetes mellitus.  Hemoglobin A1c 5.6.  Currently on SSI.  Prior to admission patient on Glucophage  500 mg daily.  Resume as needed CBGs ok if they start climbing above 180 will restart metformin   -05/10/24 having some early evening elevations >180 but mostly 130-170s, hold off on Metformin  for now but might consider restarting.   CBG (last 3)  Recent Labs    05/09/24 1641 05/09/24 2129 05/10/24 0611  GLUCAP 283* 137* 133*    11.  Hyperlipidemia: continue Lipitor  80mg  daily  12.  History of CAD/PCI/CABG/SVT.  Follow-up [name] outpatient  13.  Pancytopenia.  Heme oncology Dr.Pasam outpatient.  Follow-up CBC -No heparin  for DVT prophyllaxis, monitor for bleeding on DAPT transfuse if PLT<20K or Hgb <7.0 Hgb stable  foley bag light pink tinge, no clots, some of the drop in Hgb is dilutional due to rehydration with IVF     Latest Ref Rng & Units 05/09/2024    5:23 AM 05/08/2024    6:08 AM 05/05/2024    5:22 AM  CBC  WBC 4.0 - 10.5 K/uL  2.4  3.1   Hemoglobin 13.0 - 17.0 g/dL 7.5  7.6  8.9   Hematocrit 39.0 - 52.0 % 21.9  22.1  26.0   Platelets 150 - 400 K/uL  43  61     14.  Cirrhosis of liver.  Imaging suggestive of cirrhosis.  Bili mildly elevated as well as INR 1.3.  Follow-up outpatient  15.  Urinary retention.  In-N-Out catheterization 12/13 x 1.  Placed on Flomax . Messaged nursing to discuss her PVRs. -04/27/24 flomax  d/c'd yesterday, no PVRs documented, will  discuss with nursing-- ordered PVRs -12/25 PVRs variable. Had to be cathed once yesterday for pvr of 350.   -continue PVR's for now -05/03/24 only a few PVRs documented, all low, no caths; spiked fever last night, checking labs/urine/cxr as below. -05/04/24 labs yesterday stable/ok, but U/A looking iffy with cloudy urine, trace leuks, 21-50 WBC though no bacteria-- however pt not feeling well today and having incontinence, highly suspect UTI-- STARTING Keflex  500mg  TID x7d, UCx  Proteus mirabilis , >100K S to Keflex   Restart flomax  as PVRs are now up , no anticholinergic meds identified - monitor BPs on Flomax  which was held due to low BPs -05/10/24 foley in place, doesn't want to try TOV-- flomax  stopped 05/09/24  16.  Constipation: continue Colace 100 mg daily -05/10/24 LBM yesterday per pt but not documented  17. Fever 05/02/24 due to UTI resolved  18.  Pre renal azotemia - IVF at bedtime but change to .9NS recheck BMPstable, BUN still up cont IVF for now-- stopped 05/09/24    Latest Ref Rng & Units 05/09/2024    5:23 AM 05/08/2024    6:08 AM 05/05/2024    5:22 AM  BMP  Glucose 70 - 99 mg/dL 863  878  858   BUN 8 - 23 mg/dL 28  30  46   Creatinine 0.61 - 1.24 mg/dL 8.86  8.93  8.72   Sodium 135 - 145 mmol/L 138  137  137   Potassium 3.5 - 5.1 mmol/L 4.0  4.0  4.0   Chloride 98 - 111 mmol/L 108  108  106   CO2 22 - 32 mmol/L 21  20  20    Calcium  8.9 - 10.3 mg/dL 7.8  8.1  8.7      19.  Angina- no evidence of MI, now on Coreg  and off midodrine , concerns about hypotension monitor closely--  see #9  LOS: 16 days A FACE TO FACE EVALUATION WAS PERFORMED  [address] 05/10/2024, 11:09 AM     "

## 2024-05-10 NOTE — Plan of Care (Signed)
" °  Problem: Consults Goal: RH STROKE PATIENT EDUCATION Description: See Patient Education module for education specifics  Outcome: Progressing Goal: Diabetes Guidelines if Diabetic/Glucose > 140 Description: If diabetic or lab glucose is > 140 mg/dl - Initiate Diabetes/Hyperglycemia Guidelines & Document Interventions  Outcome: Progressing   Problem: RH BOWEL ELIMINATION Goal: RH STG MANAGE BOWEL WITH ASSISTANCE Description: STG Manage Bowel with mod I Assistance. Outcome: Progressing Goal: RH STG MANAGE BOWEL W/MEDICATION W/ASSISTANCE Description: STG Manage Bowel with Medication with mod I Assistance. Outcome: Progressing   Problem: RH BLADDER ELIMINATION Goal: RH STG MANAGE BLADDER WITH ASSISTANCE Description: STG Manage Bladder With mod I Assistance Outcome: Progressing Goal: RH STG MANAGE BLADDER WITH MEDICATION WITH ASSISTANCE Description: STG Manage Bladder With Medication With mod I Assistance. Outcome: Progressing   Problem: RH KNOWLEDGE DEFICIT Goal: RH STG INCREASE KNOWLEDGE OF DIABETES Description: Patient and spouse will be able to manage DM using educational resources for medications and dietary modification independently Outcome: Progressing Goal: RH STG INCREASE KNOWLEDGE OF HYPERTENSION Description: Patient and spouse will be able to manage HTN using educational resources for medications and dietary modification independently Outcome: Progressing Goal: RH STG INCREASE KNOWLEDGE OF DYSPHAGIA/FLUID INTAKE Description: Patient and spouse will be able to manage Dysphagia using educational resources for medications and dietary modification independently Outcome: Progressing Goal: RH STG INCREASE KNOWLEGDE OF HYPERLIPIDEMIA Description: Patient and spouse will be able to manage HLD using educational resources for medications and dietary modification independently Outcome: Progressing Goal: RH STG INCREASE KNOWLEDGE OF STROKE PROPHYLAXIS Description: Patient and spouse  will be able to manage secondary risks using educational resources for medications and dietary modification independently Outcome: Progressing   "

## 2024-05-11 ENCOUNTER — Other Ambulatory Visit (HOSPITAL_COMMUNITY): Payer: Self-pay

## 2024-05-11 LAB — CORTISOL-AM, BLOOD: Cortisol - AM: 12.8 ug/dL (ref 6.7–22.6)

## 2024-05-11 LAB — GLUCOSE, CAPILLARY
Glucose-Capillary: 106 mg/dL — ABNORMAL HIGH (ref 70–99)
Glucose-Capillary: 142 mg/dL — ABNORMAL HIGH (ref 70–99)
Glucose-Capillary: 132 mg/dL — ABNORMAL HIGH (ref 70–99)
Glucose-Capillary: 191 mg/dL — ABNORMAL HIGH (ref 70–99)

## 2024-05-11 NOTE — Progress Notes (Signed)
 "                                                        PROGRESS NOTE   Subjective/Complaints:  Pt doing well. Slept well, denies pain, LBM yesterday per pt though not documented. No issues with foley. No other complaints or concerns.    ROS: as per HPI. Denies CP, SOB, abd pain, N/V/D/C, or any other complaints at this time.    Objective:   No results found.  Recent Labs    05/09/24 0523  HGB 7.5*  HCT 21.9*   Recent Labs    05/09/24 0523  NA 138  K 4.0  CL 108  CO2 21*  GLUCOSE 136*  BUN 28*  CREATININE 1.13  CALCIUM  7.8*    Intake/Output Summary (Last 24 hours) at 05/11/2024 1102 Last data filed at 05/11/2024 0719 Gross per 24 hour  Intake 397 ml  Output 1050 ml  Net -653 ml        Physical Exam: Vital Signs Blood pressure (!) 119/54, pulse 78, temperature 98.9 F (37.2 C), temperature source Oral, resp. rate 18, height 5' 10 (1.778 m), weight 75.6 kg, SpO2 98%.   General: No acute distress, resting comfortably in bed..  Mood and affect are appropriate, a little flat Heart: Regular rate and rhythm no rubs murmurs or extra sounds Lungs: Clear to auscultation, breathing unlabored, no rales or wheezes Abdomen: Positive bowel sounds, soft nontender to palpation, nondistended Extremities: No clubbing, cyanosis, or edema Skin: No evidence of breakdown, no evidence of rash to exposed surfaces, scattered bruises, L cartoid surgical incision remains c/d/I  PRIOR EXAMS: Neurologic: Cranial nerves II through XII intact, motor strength is 5/5 in left UE/LE and 5/5 right deltoid, bicep, tricep, grip, 4/5 hip flexor, knee extensors, ankle dorsiflexor and plantar flexor Sensory exam normal sensation to light touch  in bilateral upper and left lower lower extremities Reduced LT RLE Oriented to person and place, month, day Cerebellar exam mild dysmetria Right finger nose finger  Musculoskeletal: Full range of motion in all 4 extremities. No joint swelling     Assessment/Plan: 1. Functional deficits which require 3+ hours per day of interdisciplinary therapy in a comprehensive inpatient rehab setting. Physiatrist is providing close team supervision and 24 hour management of active medical problems listed below. Physiatrist and rehab team continue to assess barriers to discharge/monitor patient progress toward functional and medical goals  Care Tool:  Bathing    Body parts bathed by patient: Right arm, Left upper leg, Left arm, Right lower leg, Chest, Left lower leg, Abdomen, Face, Front perineal area, Buttocks, Right upper leg         Bathing assist Assist Level: Supervision/Verbal cueing     Upper Body Dressing/Undressing Upper body dressing   What is the patient wearing?: Pull over shirt    Upper body assist Assist Level: Supervision/Verbal cueing    Lower Body Dressing/Undressing Lower body dressing      What is the patient wearing?: Underwear/pull up, Pants     Lower body assist Assist for lower body dressing: Contact Guard/Touching assist     Toileting Toileting    Toileting assist Assist for toileting: Maximal Assistance - Patient 25 - 49%     Transfers Chair/bed transfer  Transfers assist     Chair/bed transfer assist level:  Contact Guard/Touching assist     Locomotion Ambulation   Ambulation assist      Assist level: Contact Guard/Touching assist Assistive device: Walker-rolling Max distance: 100 feet   Walk 10 feet activity   Assist     Assist level: Contact Guard/Touching assist Assistive device: Walker-rolling   Walk 50 feet activity   Assist Walk 50 feet with 2 turns activity did not occur: Safety/medical concerns  Assist level: Contact Guard/Touching assist Assistive device: Walker-rolling    Walk 150 feet activity   Assist Walk 150 feet activity did not occur: Safety/medical concerns (fatigue/SOB)         Walk 10 feet on uneven surface  activity   Assist Walk 10  feet on uneven surfaces activity did not occur: Safety/medical concerns         Wheelchair     Assist Is the patient using a wheelchair?: Yes Type of Wheelchair: Manual    Wheelchair assist level: Dependent - Patient 0% Max wheelchair distance: 15 ft    Wheelchair 50 feet with 2 turns activity    Assist        Assist Level: Dependent - Patient 0%   Wheelchair 150 feet activity     Assist      Assist Level: Dependent - Patient 0%   Blood pressure (!) 119/54, pulse 78, temperature 98.9 F (37.2 C), temperature source Oral, resp. rate 18, height 5' 10 (1.778 m), weight 75.6 kg, SpO2 98%.  Medical Problem List and Plan: 1. 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  Antithrombotics: -DVT/anticoagulation:  Mechanical: Antiembolism stockings, thigh (TED hose) Bilateral lower extremities             -antiplatelet therapy: Aspirin  81 mg daily and Plavix  75 mg daily  3. Pain Management: Tylenol  as needed  4. Mood/Behavior/Sleep: Prozac  20 mg daily.             -antipsychotic agents: N/A  -melatonin prn  5. Neuropsych/cognition: This patient is capable of making decisions on his own behalf.  6. Skin/Wound Care: Routine skin checks  7. Fluids/Electrolytes/Nutrition: Routine and analysis with follow-up chemistries  8.  Symptomatic left carotid artery stenosis.  Status post stenting/angioplasty 04/21/2024 per Dr. Gretta  9.  Hypotension: off ProAmatine  10 mg 3 times daily.  Monitor with increased mobility -05/10/24 BPs soft and orthostatic, midodrine  restarted at 2.5mg  TID-- increased today to 5mg  TID--  appreciate their assistance.  -05/11/24 BPs better today, no orthostatics done yet, cardiology following, mentions doing 8am cortisol level as well. F/up on these Vitals:   05/09/24 0557 05/09/24 0859 05/09/24 0901 05/09/24 0909  BP: (!) 109/54 (!) 99/58 (!) 92/49 (!) 79/58   05/09/24 0911 05/09/24 0943 05/09/24 1803 05/09/24 1940  BP: (!) 67/38 (!) 113/55 127/66 123/69   05/10/24 0549 05/10/24 1348 05/10/24 2111 05/11/24 0440  BP: (!) 104/57 122/76 (!) 112/55 (!) 119/54    10.  Diabetes mellitus.  Hemoglobin A1c 5.6.  Currently on SSI.  Prior to admission patient on Glucophage  500 mg daily.  Resume as needed CBGs ok if they start climbing above 180 will restart metformin   -1/3-4/26 having some afternoon/early evening elevations >180 but mostly 130-170s, hold off on Metformin  for now but might consider restarting.   CBG (last 3)  Recent Labs    05/10/24 1609 05/10/24 2114 05/11/24 0607  GLUCAP 178* 152* 106*    11.  Hyperlipidemia: continue Lipitor  80mg  daily  12.  History of CAD/PCI/CABG/SVT.  Follow-up [name] outpatient  13.  Pancytopenia.  Heme oncology Dr.Pasam outpatient.  Follow-up CBC -No heparin  for DVT prophyllaxis, monitor for bleeding on DAPT transfuse if PLT<20K or Hgb <7.0 Hgb stable  foley bag light pink tinge, no clots, some of the drop in Hgb is dilutional due to rehydration with IVF     Latest Ref Rng & Units 05/09/2024    5:23 AM 05/08/2024    6:08 AM 05/05/2024    5:22 AM  CBC  WBC 4.0 - 10.5 K/uL  2.4  3.1   Hemoglobin 13.0 - 17.0 g/dL 7.5  7.6  8.9   Hematocrit 39.0 - 52.0 % 21.9  22.1  26.0   Platelets 150 - 400 K/uL  43  61     14.  Cirrhosis of liver.  Imaging suggestive of cirrhosis.  Bili mildly elevated as well as INR 1.3.  Follow-up outpatient  15.  Urinary retention.  In-N-Out catheterization 12/13 x 1.  Placed on Flomax . Messaged nursing to discuss her PVRs. -04/27/24 flomax  d/c'd yesterday, no PVRs documented, will discuss with nursing--  ordered PVRs -12/25 PVRs variable. Had to be cathed once yesterday for pvr of 350.   -continue PVR's for now -05/03/24 only a few PVRs documented, all low, no caths; spiked fever last night, checking labs/urine/cxr as below. -05/04/24 labs yesterday stable/ok, but U/A looking iffy with cloudy urine, trace leuks, 21-50 WBC though no bacteria-- however pt not feeling well today and having incontinence, highly suspect UTI-- STARTING Keflex  500mg  TID x7d, UCx  Proteus mirabilis , >100K S to Keflex   Restart flomax  as PVRs are now up , no anticholinergic meds identified - monitor BPs on Flomax  which was held due to low BPs -05/10/24 foley in place, doesn't want to try TOV-- flomax  stopped 05/09/24  16.  Constipation: continue Colace 100 mg daily -05/11/24 LBM yesterday per pt but not documented  17. Fever 05/02/24 due to UTI resolved  18.  Pre renal azotemia - IVF at bedtime but change to .9NS recheck BMPstable, BUN still up cont IVF for now-- stopped 05/09/24    Latest Ref Rng & Units 05/09/2024    5:23 AM 05/08/2024    6:08 AM 05/05/2024    5:22 AM  BMP  Glucose 70 - 99 mg/dL 863  878  858   BUN 8 - 23 mg/dL 28  30  46   Creatinine 0.61 - 1.24 mg/dL 8.86  8.93  8.72   Sodium 135 - 145 mmol/L 138  137  137   Potassium 3.5 - 5.1 mmol/L 4.0  4.0  4.0   Chloride 98 - 111 mmol/L 108  108  106   CO2 22 - 32 mmol/L 21  20  20    Calcium  8.9 - 10.3 mg/dL 7.8  8.1  8.7      19.  Angina- no evidence of MI, now on Coreg  and off midodrine  but then coreg  stopped and midodrine  restarted d/t concerns about hypotension monitor closely-- see #9. Cardiology following.     LOS: 17 days A FACE  TO FACE EVALUATION WAS PERFORMED  [address] Amandalee Lacap 05/11/2024, 11:02 AM     "

## 2024-05-11 NOTE — Progress Notes (Signed)
 Occupational Therapy Session Note  Patient Details  Name: Christopher Roberts MRN: [id number] Date of Birth: Sep 11, 1945  Today's Date: 05/11/2024 OT Individual Time: 1102-1200 OT Individual Time Calculation (min): 58 min    Short Term Goals: Week 3:  OT Short Term Goal 1 (Week 3): STG=LTG d/t ELOS  Skilled Therapeutic Interventions/Progress Updates:     Pt received resting in bed with thigh high TEDs donned prior to OT arrival. Pt presenting to be tired, however in good spirits receptive to skilled OT session reporting 0/10 pain- OT offering intermittent rest breaks, repositioning, and therapeutic support to optimize participation in therapy session. Pt continues to be limited by dizziness, SOB upon exertion, and orthostatic hypotension. Donned abdominal binder OH and maintained throughout session. Assessed vitals throughout session to monitor prevalence of orthostatic hypotension.   Pt transitioned to EOB using bed rail with SUP +increased time and MAX effort. Pt noted to be SOB following requiring increased time for rest break and OT guiding Pt through PLB'ing. Donned shoes EOB with set-up A.  Pt attempted to stand and transfer to wc, however upon standing and initiating first step Pt became SOB, reporting dizziness and returned to seated position EOB.  Vitals: HR 79 BP 109/65(79) SPO2 99% Symptoms resolved following rest breast break.   Engaged Pt in completing short bouts of functional mobility using RW to address endurance deficit, to simulate ambulating household distances, and to work on tolerance of upright position. Pt required close SUP-CGA overall during mobility with w/c follow provided for safety d/t urgent need to sit when dizzy. Pt with the following vitals following each bout of 10-47ft of functional mobility:  Vitals following 1st bout: HR 82 BP 107/63(77) SPO2 96% Vitals following 2nd bout: HR 85 BP 111/63(78) SPO2 95% Vitals following 3rd bout: HR 86 BP 104/60(74) SPO2  95%  Engaged pt in completing U/LB endurance training on NuStep to work on coordination and activity tolerance. Pt completed 4 min x2 trials on level 2 resistance at SMP 30-35 with vitals assessed following:  Vitals following 1st trial: HR 82 BP 106/50(66) SPO2 99% Vitals following 2nd trial: HR 78 BP 103/57(72) SPO2 96%  Pt very fatigued at end of session. Transported back to room total A in wc. Pt was left resting in wc with call bell in reach, seatbelt alarm on, and all needs met.    Therapy Documentation Precautions:  Precautions Precautions: Fall Recall of Precautions/Restrictions: Impaired Precaution/Restrictions Comments: RLE weakness, RUE weakness Restrictions Weight Bearing Restrictions Per Provider Order: No  Therapy/Group: Individual Therapy  Katheryn SHAUNNA [place] 05/11/2024, 7:35 AM

## 2024-05-11 NOTE — Plan of Care (Signed)
  Problem: Consults Goal: RH STROKE PATIENT EDUCATION Description: See Patient Education module for education specifics  Outcome: Progressing   

## 2024-05-11 NOTE — Progress Notes (Signed)
"  °  Progress Note  Patient Name: Christopher Roberts Date of Encounter: 05/11/2024 [facility] Cardiologist: None   Interval Summary   Denies any dizziness or syncope  Vital Signs Vitals:   05/10/24 0549 05/10/24 1348 05/10/24 2111 05/11/24 0440  BP: (!) 104/57 122/76 (!) 112/55 (!) 119/54  Pulse: 78 80 86 78  Resp: 18 18 17 18   Temp: 98.9 F (37.2 C) 98.9 F (37.2 C) 98.8 F (37.1 C) 98.9 F (37.2 C)  TempSrc: Oral  Oral Oral  SpO2: 97% 96% 100% 98%  Weight:      Height:        Intake/Output Summary (Last 24 hours) at 05/11/2024 0853 Last data filed at 05/11/2024 0719 Gross per 24 hour  Intake 515 ml  Output 1050 ml  Net -535 ml      05/09/2024    2:05 AM 04/28/2024    5:46 AM 04/25/2024    2:05 AM  Last 3 Weights  Weight (lbs) 166 lb 10.7 oz 168 lb 14 oz 159 lb 13.3 oz  Weight (kg) 75.6 kg 76.6 kg 72.5 kg      Patient is currently in rehab and not on telemetry  Physical Exam  GEN: Well nourished, well developed in no acute distress HEENT: Normal NECK: No JVD; No carotid bruits LYMPHATICS: No lymphadenopathy CARDIAC:RRR, no murmurs, rubs, gallops RESPIRATORY:  Clear to auscultation without rales, wheezing or rhonchi  ABDOMEN: Soft, non-tender, non-distended MUSCULOSKELETAL:  No edema; No deformity  SKIN: Warm and dry NEUROLOGIC:  Alert and oriented x 3 PSYCHIATRIC:  Normal affect  Assessment & Plan   CAD s/p remote history of CABG with LIMA to LAD, and inferior STEMI in 2022 Hyperlipidemia -High-sensitivity troponin 32 > 47 > 64.  Not c/w ACS -No further ischemic evaluation necessary at this time. - Continue aspirin  81 mg daily  - Continue Plavix  75 mg  - Continue atorvastatin  80 mg daily - Coreg  was restarted but was later stopped because of soft blood pressures and orthostatic hypotension.  Orthostatic hypotension Orthostatic vitals 1/64morning were 99/58 lying, 92/49 sitting, 79/58 and 67/38 standing.  These orthostatic vital signs were measured  while patient was wearing compression socks, abdominal binder, and Ace wrap's. - started on Midodrine  2.5mg  TID - repeat orthostatic BPs 1/3 with abdominal binder and thigh high TED hose shows persistent orthostatic drops in SPB to - Midodrine  increased yesterday to 5 mg 3 times daily (7 AM 12 noon and 4 PM)  - Orthostatic blood pressures by PT pending this morning - check 8 am Cortisol level  Carotid artery disease -Underwent TCAR to left carotid on 04/21/2024.   -Planning to get a TCAR to the right side at a later date  CVA Patient was hospitalized for stroke.   Otherwise management per primary    For questions or updates, please contact [facility] Please consult [email or web address] for contact info under   Signed, Wilbert Bihari, MD  8:53 AM  05/11/2024 "

## 2024-05-12 ENCOUNTER — Inpatient Hospital Stay: Admitting: Oncology

## 2024-05-12 ENCOUNTER — Inpatient Hospital Stay

## 2024-05-12 ENCOUNTER — Other Ambulatory Visit (HOSPITAL_COMMUNITY): Payer: Self-pay

## 2024-05-12 LAB — CBC
HCT: 22.5 % — ABNORMAL LOW (ref 39.0–52.0)
Hemoglobin: 7.6 g/dL — ABNORMAL LOW (ref 13.0–17.0)
MCH: 30.2 pg (ref 26.0–34.0)
MCHC: 33.8 g/dL (ref 30.0–36.0)
MCV: 89.3 fL (ref 80.0–100.0)
Platelets: 56 K/uL — ABNORMAL LOW (ref 150–400)
RBC: 2.52 MIL/uL — ABNORMAL LOW (ref 4.22–5.81)
RDW: 18.8 % — ABNORMAL HIGH (ref 11.5–15.5)
WBC: 3.7 K/uL — ABNORMAL LOW (ref 4.0–10.5)
nRBC: 1.1 % — ABNORMAL HIGH (ref 0.0–0.2)

## 2024-05-12 LAB — BASIC METABOLIC PANEL WITH GFR
Anion gap: 10 (ref 5–15)
BUN: 29 mg/dL — ABNORMAL HIGH (ref 8–23)
CO2: 20 mmol/L — ABNORMAL LOW (ref 22–32)
Calcium: 7.8 mg/dL — ABNORMAL LOW (ref 8.9–10.3)
Chloride: 107 mmol/L (ref 98–111)
Creatinine, Ser: 1.19 mg/dL (ref 0.61–1.24)
GFR, Estimated: >60 mL/min
Glucose, Bld: 139 mg/dL — ABNORMAL HIGH (ref 70–99)
Potassium: 4.1 mmol/L (ref 3.5–5.1)
Sodium: 136 mmol/L (ref 135–145)

## 2024-05-12 LAB — GLUCOSE, CAPILLARY
Glucose-Capillary: 139 mg/dL — ABNORMAL HIGH (ref 70–99)
Glucose-Capillary: 178 mg/dL — ABNORMAL HIGH (ref 70–99)
Glucose-Capillary: 118 mg/dL — ABNORMAL HIGH (ref 70–99)

## 2024-05-12 MED ORDER — MIDODRINE HCL 5 MG PO TABS
5.0000 mg | ORAL_TABLET | Freq: Three times a day (TID) | ORAL | 0 refills | Status: DC
  Filled 2024-05-12: qty 90, 30d supply, fill #0

## 2024-05-12 MED ORDER — MELATONIN 5 MG PO TABS
5.0000 mg | ORAL_TABLET | Freq: Every evening | ORAL | 0 refills | Status: AC | PRN
  Filled 2024-05-12: qty 30, 30d supply, fill #0

## 2024-05-12 NOTE — Progress Notes (Signed)
 Inpatient Rehabilitation Discharge Medication Review by a Pharmacist  A complete drug regimen review was completed for this patient to identify any potential clinically significant medication issues.  High Risk Drug Classes Is patient taking? Indication by Medication  Antipsychotic No   Anticoagulant No   Antibiotic No   Opioid No   Antiplatelet Yes Aspirin  81/Plavix : CVA ppx  Hypoglycemics/insulin  Yes Metformin : DM  Vasoactive Medication Yes Midodrine : hypotension   Chemotherapy No   Other Yes Tylenol : pain Lipitor : HLD Docusate: bowel regimen Miralax : bowel regimen Nitroglycerin : chest pain Fluoxetine : mood Trazodone : sleep Meclizine : vertigo Melatonin: sleep     Type of Medication Issue Identified Description of Issue Recommendation(s)  Drug Interaction(s) (clinically significant)     Duplicate Therapy     Allergy     No Medication Administration End Date     Incorrect Dose     Additional Drug Therapy Needed     Significant med changes from prior encounter (inform family/care partners about these prior to discharge). Amlodipine  and Carvedilol  were discontinued due to hypotension.  Added Midodrine  for hypotension. Restart or discontinue as appropriate. Communicate medication changes with patient/family at discharge  Other       Clinically significant medication issues were identified that warrant physician communication and completion of prescribed/recommended actions by midnight of the next day:  No   Time spent performing this drug regimen review (minutes): 30  [employer], Pharm.D., BCPS Clinical Pharmacist Clinical phone for 05/12/2024 from 7:30-3:00 is [phone].  **Pharmacist phone directory can be found on [email or web address] listed under [facility].  05/12/2024 10:34 AM

## 2024-05-12 NOTE — Progress Notes (Signed)
 Speech Language Pathology Daily Session Note  Patient Details  Name: Christopher Roberts MRN: [id number] Date of Birth: 04/01/1946  Today's Date: 05/12/2024 SLP Individual Time: 0812-0857 SLP Individual Time Calculation (min): 45 min  Short Term Goals: Week 2: SLP Short Term Goal 1 (Week 2): STGs = LTGs d/t ELOS  Skilled Therapeutic Interventions: Initial 12 minutes of session missed due to patient toileting with nursing. Skilled therapy session focused on communication and cognitive goals. SLP facilitated session by prompting completion of Cognistat to re-assess cognitive linguistic skills. Patient scored WFL on orientation, attention and expressive/receptive language. Patient with mild deficits in delayed recall and problem solving. Patients speech intelligibility remains at 100% at the conversational level. After re-evaluation was complete, SLP provided minA for patient to complete compare/contrast problem solving activity. Patient left in bed with alarm set and call bell in reach. Continue POC.     Pain  5/10 leg pain - repositioned   Therapy/Group: Individual Therapy  Rhilynn Preyer M.A., CCC-SLP 05/12/2024, 8:54 AM

## 2024-05-12 NOTE — Progress Notes (Addendum)
 "                                                        PROGRESS NOTE   Subjective/Complaints:  Working with therapy this morning.  Reports had some mild dizziness when getting up this morning-overall improving from prior.  No additional complaints or concerns.  ROS: as per HPI. Denies CP, SOB, abd pain, N/V/D/C, or any other complaints at this time.   + Occasional dizziness  Objective:   No results found.  Recent Labs    05/12/24 0550  WBC 3.7*  HGB 7.6*  HCT 22.5*  PLT 56*   Recent Labs    05/12/24 0550  NA 136  K 4.1  CL 107  CO2 20*  GLUCOSE 139*  BUN 29*  CREATININE 1.19  CALCIUM  7.8*    Intake/Output Summary (Last 24 hours) at 05/12/2024 0924 Last data filed at 05/12/2024 0805 Gross per 24 hour  Intake 472 ml  Output 925 ml  Net -453 ml        Physical Exam: Vital Signs Blood pressure (!) 118/58, pulse 82, temperature (!) 97.4 F (36.3 C), resp. rate 17, height 5' 10 (1.778 m), weight 75.6 kg, SpO2 94%.   General: No acute distress, working with therapy in his room Mood and affect are appropriate, a little flat Heart: Regular rate and rhythm no rubs murmurs or extra sounds Lungs: Clear to auscultation, breathing unlabored, no rales or wheezes Abdomen: Positive bowel sounds, soft nontender to palpation, nondistended Extremities: No clubbing, cyanosis, or edema Skin: No evidence of breakdown, no evidence of rash to exposed surfaces, scattered bruises, L cartoid surgical incision remains c/d/I Neuro: Alert and awake, cranial nerves II through XII grossly intact, moving all 4 extremities in bed  PRIOR EXAMS: Neurologic: Cranial nerves II through XII intact, motor strength is 5/5 in left UE/LE and 5/5 right deltoid, bicep, tricep, grip, 4/5 hip flexor, knee extensors, ankle dorsiflexor and plantar flexor Sensory exam normal sensation to light touch  in bilateral upper and left lower lower extremities Reduced LT RLE Oriented to person and place, month,  day Cerebellar exam mild dysmetria Right finger nose finger  Musculoskeletal: Full range of motion in all 4 extremities. No joint swelling    Assessment/Plan: 1. Functional deficits which require 3+ hours per day of interdisciplinary therapy in a comprehensive inpatient rehab setting. Physiatrist is providing close team supervision and 24 hour management of active medical problems listed below. Physiatrist and rehab team continue to assess barriers to discharge/monitor patient progress toward functional and medical goals  Care Tool:  Bathing    Body parts bathed by patient: Right arm, Left upper leg, Left arm, Right lower leg, Chest, Left lower leg, Abdomen, Face, Front perineal area, Buttocks, Right upper leg         Bathing assist Assist Level: Supervision/Verbal cueing     Upper Body Dressing/Undressing Upper body dressing   What is the patient wearing?: Pull over shirt    Upper body assist Assist Level: Supervision/Verbal cueing    Lower Body Dressing/Undressing Lower body dressing      What is the patient wearing?: Underwear/pull up, Pants     Lower body assist Assist for lower body dressing: Contact Guard/Touching assist     Toileting Toileting    Toileting assist Assist for toileting:  Maximal Assistance - Patient 25 - 49%     Transfers Chair/bed transfer  Transfers assist     Chair/bed transfer assist level: Contact Guard/Touching assist     Locomotion Ambulation   Ambulation assist      Assist level: Contact Guard/Touching assist Assistive device: Walker-rolling Max distance: 100 feet   Walk 10 feet activity   Assist     Assist level: Contact Guard/Touching assist Assistive device: Walker-rolling   Walk 50 feet activity   Assist Walk 50 feet with 2 turns activity did not occur: Safety/medical concerns  Assist level: Contact Guard/Touching assist Assistive device: Walker-rolling    Walk 150 feet activity   Assist Walk 150  feet activity did not occur: Safety/medical concerns (fatigue/SOB)         Walk 10 feet on uneven surface  activity   Assist Walk 10 feet on uneven surfaces activity did not occur: Safety/medical concerns         Wheelchair     Assist Is the patient using a wheelchair?: Yes Type of Wheelchair: Manual    Wheelchair assist level: Dependent - Patient 0% Max wheelchair distance: 15 ft    Wheelchair 50 feet with 2 turns activity    Assist        Assist Level: Dependent - Patient 0%   Wheelchair 150 feet activity     Assist      Assist Level: Dependent - Patient 0%   Blood pressure (!) 118/58, pulse 82, temperature (!) 97.4 F (36.3 C), resp. rate 17, height 5' 10 (1.778 m), weight 75.6 kg, SpO2 94%.  Medical Problem List and Plan: 1. 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  Antithrombotics: -DVT/anticoagulation:  Mechanical: Antiembolism stockings, thigh (TED hose) Bilateral lower extremities             -antiplatelet therapy: Aspirin  81 mg daily and Plavix  75 mg daily  3. Pain Management: Tylenol  as needed  4. Mood/Behavior/Sleep: Prozac  20 mg daily.             -antipsychotic agents: N/A  -melatonin prn  5. Neuropsych/cognition: This patient is capable of making decisions on his own behalf.  6. Skin/Wound Care: Routine skin checks  7. Fluids/Electrolytes/Nutrition: Routine and analysis with follow-up chemistries  8.  Symptomatic left carotid artery stenosis.  Status post stenting/angioplasty 04/21/2024 per Dr. Gretta  9.   Hypotension: off ProAmatine  10 mg 3 times daily.  Monitor with increased mobility -05/10/24 BPs soft and orthostatic, midodrine  restarted at 2.5mg  TID-- increased today to 5mg  TID-- appreciate their assistance.  -05/11/24 BPs better today, no orthostatics done yet, cardiology following, mentions doing 8am cortisol level as well. F/up on these -1/5 BP appears improved overall, continue midodrine , cardiology following appreciate assistance.  A.m. cortisol 12.8 Vitals:   05/09/24 0909 05/09/24 0911 05/09/24 0943 05/09/24 1803  BP: (!) 79/58 (!) 67/38 (!) 113/55 127/66   05/09/24 1940 05/10/24 0549 05/10/24 1348 05/10/24 2111  BP: 123/69 (!) 104/57 122/76 ROLLEN)  112/55   05/11/24 0440 05/11/24 1429 05/11/24 1952 05/12/24 0514  BP: (!) 119/54 128/67 121/69 (!) 118/58    10.  Diabetes mellitus.  Hemoglobin A1c 5.6.  Currently on SSI.  Prior to admission patient on Glucophage  500 mg daily.  Resume as needed CBGs ok if they start climbing above 180 will restart metformin   -1/3-4/26 having some afternoon/early evening elevations >180 but mostly 130-170s, hold off on Metformin  for now but might consider restarting.   CBG (last 3)  Recent Labs    05/11/24 1614 05/11/24 2108 05/12/24 0558  GLUCAP 132* 191* 139*    11.  Hyperlipidemia: continue Lipitor  80mg  daily  12.  History of CAD/PCI/CABG/SVT.  Follow-up [name] outpatient  13.  Pancytopenia.  Heme oncology Dr.Pasam outpatient.  Follow-up CBC -No heparin  for DVT prophyllaxis, monitor for bleeding on DAPT transfuse if PLT<20K or Hgb <7.0 Hgb stable  foley bag light pink tinge, no clots, some of the drop in Hgb is dilutional due to rehydration with IVF  -1/5 appears overall stable from prior continue to monitor    Latest Ref Rng & Units 05/12/2024    5:50 AM 05/09/2024    5:23 AM 05/08/2024    6:08 AM  CBC  WBC 4.0 - 10.5 K/uL 3.7   2.4   Hemoglobin 13.0 - 17.0 g/dL 7.6  7.5  7.6   Hematocrit 39.0 - 52.0 % 22.5  21.9  22.1   Platelets 150  - 400 K/uL 56   43     14.  Cirrhosis of liver.  Imaging suggestive of cirrhosis.  Bili mildly elevated as well as INR 1.3.  Follow-up outpatient  15.  Urinary retention.  In-N-Out catheterization 12/13 x 1.  Placed on Flomax . Messaged nursing to discuss her PVRs. -04/27/24 flomax  d/c'd yesterday, no PVRs documented, will discuss with nursing-- ordered PVRs -12/25 PVRs variable. Had to be cathed once yesterday for pvr of 350.   -continue PVR's for now -05/03/24 only a few PVRs documented, all low, no caths; spiked fever last night, checking labs/urine/cxr as below. -05/04/24 labs yesterday stable/ok, but U/A looking iffy with cloudy urine, trace leuks, 21-50 WBC though no bacteria-- however pt not feeling well today and having incontinence, highly suspect UTI-- STARTING Keflex  500mg  TID x7d, UCx  Proteus mirabilis , >100K S to Keflex   Restart flomax  as PVRs are now up , no anticholinergic meds identified - monitor BPs on Flomax  which was held due to low BPs -05/10/24 foley in place, doesn't want to try TOV-- flomax  stopped 05/09/24  16.  Constipation: continue Colace 100 mg daily -05/11/24 LBM yesterday per pt but not documented -1/5 LBM today continue current regimen  17. Fever 05/02/24 due to UTI resolved  18.  Pre renal azotemia - IVF at bedtime but change to .9NS recheck BMPstable, BUN still up cont IVF for now-- stopped 05/09/24 1/5 BUN stable at 29, creatinine slightly higher at 1.19.  Encourage oral fluids    Latest Ref Rng & Units 05/12/2024    5:50 AM 05/09/2024    5:23 AM 05/08/2024    6:08 AM  BMP  Glucose 70 - 99 mg/dL 860  863  878   BUN 8 - 23 mg/dL 29  28  30    Creatinine 0.61 - 1.24 mg/dL 8.80  8.86  8.93   Sodium 135 - 145 mmol/L 136  138  137   Potassium 3.5 - 5.1 mmol/L 4.1  4.0  4.0   Chloride 98 - 111  mmol/L 107  108  108   CO2 22 - 32 mmol/L 20  21  20    Calcium  8.9 - 10.3 mg/dL 7.8  7.8  8.1      19.  Angina- no evidence of MI, now on Coreg  and off midodrine  but then  coreg  stopped and midodrine  restarted d/t concerns about hypotension monitor closely-- see #9. Cardiology following.     LOS: 18 days A FACE TO FACE EVALUATION WAS PERFORMED  Murray Collier 05/12/2024, 9:24 AM     "

## 2024-05-12 NOTE — Progress Notes (Signed)
 Speech Language Pathology Discharge Summary  Patient Details  Name: Christopher Roberts MRN: [id number] Date of Birth: [birth year]  Date of Discharge from SLP service:May 12, 2024   Patient has met 4 of 5 long term goals.  Patient to discharge at overall Supervision;Min level.  Reasons goals not met: cont to require minA for complex problem solving   Clinical Impression/Discharge Summary:  Pt has made good gains and has met 4 of 5 LTG's this admission due to improved cognition and word finding. Pt is currently an overall supervision-minA for cognitive tasks despite continuing to require minA for mildly complex problem solving. Patient requires supervision cues for utilization of compensatory strategies during word finding and expression of wants/needs. Pt/family education complete and pt will discharge home with supervision from friends/family/etc. Pt would benefit from f/u ST services to maximize cognition and communication in order to maximize functional independence.   Care Partner:  Caregiver Able to Provide Assistance: Yes  Type of Caregiver Assistance: Cognitive  Recommendation:  Outpatient SLP;Home Health SLP  Rationale for SLP Follow Up: Maximize cognitive function and independence   Equipment: n/a   Reasons for discharge: Discharged from hospital   Patient/Family Agrees with Progress Made and Goals Achieved: Yes    Sovereign Ramiro M.A., CCC-SLP 05/12/2024, 8:31 AM

## 2024-05-12 NOTE — Progress Notes (Signed)
 Occupational Therapy Session Note  Patient Details  Name: Christopher Roberts MRN: [id number] Date of Birth: 29-Jun-1945  Today's Date: 05/12/2024 OT Individual Time: 8697-8576 OT Individual Time Calculation (min): 81 min    Short Term Goals: Week 3:  OT Short Term Goal 1 (Week 3): STG=LTG d/t ELOS  Skilled Therapeutic Interventions/Progress Updates:     Pt received sitting up in w/c presenting to be in good spirits receptive to skilled OT session reporting 0/10 pain- OT offering intermittent rest breaks, repositioning, and therapeutic support to optimize participation in therapy session. Spent time at beginning of session completing reassessment of Pt's functional status. Pt presenting to be very tired today and demonstrating difficulty tolerating even light activity today. Pt's wife present for remainder of session- focused on d/c planning and family education. Provided updates on Pt's current functional status with increased need for assistance completing ADLs, transfers, and sit<>stands (close SUP-MIN A) d/t decreased activity tolerance, dizziness, and orthostatic hypotension. Educated on orthostatic hypotension symptoms, management, and wear schedule for TEDs/abdominal binder. Discussed home set-up and expressed concerns with Pt safely going up the stairs to reach his bedroom and full bathroom since Pt has declined since family training. Pt and Pt's wife initially not concerned or aware of why the stair should be a challenge, however following demonstration Pt's wife more aware and demonstrate improved insight into Pt's deficits. Pt was able to complete 5 stairs up/down using bilateral handrails and shower seat method with seated rest breaks provided half-way up the stairs and at the top of the stairs with MIN A and MAX verbal cues for technique. Educated on options for home set-up to increase safety with option of having hospital bed downstairs or moving another bed downstairs and completing sponge bath  vs getting in/out of shower with Pt and Pt's wife receptive to plan. Pt and Pt's wife in agreement that Pt would benefit from additional day of therapy for family education, to allow time to set-up home for d/c, and to obtain the necessary DME. Educated on w/c management, gait belt use, need for 24/7 SUP, and need to use RW in the home at all time vs using rollator d/t balance deficits. Informed medical team that Pt and Pt's family are in agreement to extend d/t by one day. Pt was left resting in wc with call bell in reach, seatbelt alarm on, and all needs met.    Therapy Documentation Precautions:  Precautions Precautions: Fall Recall of Precautions/Restrictions: Impaired Precaution/Restrictions Comments: RLE weakness, RUE weakness Restrictions Weight Bearing Restrictions Per Provider Order: No   Therapy/Group: Individual Therapy  Katheryn SHAUNNA [place]ines 05/12/2024, 12:57 PM

## 2024-05-12 NOTE — Progress Notes (Signed)
"  °  Progress Note  Patient Name: Christopher Roberts Date of Encounter: 05/12/2024 [facility] Cardiologist: None   Interval Summary   No acute event overnight. BP stable, endorses mild dizziness when he changes position from sitting to standing.  Vital Signs Vitals:   05/11/24 0440 05/11/24 1429 05/11/24 1952 05/12/24 0514  BP: (!) 119/54 128/67 121/69 (!) 118/58  Pulse: 78 80 87 82  Resp: 18 16 17 17   Temp: 98.9 F (37.2 C) 98.3 F (36.8 C) 98.5 F (36.9 C) (!) 97.4 F (36.3 C)  TempSrc: Oral     SpO2: 98% 98% 97% 94%  Weight:      Height:        Intake/Output Summary (Last 24 hours) at 05/12/2024 1113 Last data filed at 05/12/2024 0805 Gross per 24 hour  Intake 472 ml  Output 925 ml  Net -453 ml      05/09/2024    2:05 AM 04/28/2024    5:46 AM 04/25/2024    2:05 AM  Last 3 Weights  Weight (lbs) 166 lb 10.7 oz 168 lb 14 oz 159 lb 13.3 oz  Weight (kg) 75.6 kg 76.6 kg 72.5 kg      Patient is currently in rehab and not on telemetry  Physical Exam  GEN: Well nourished, well developed in no acute distress HEENT: Normal NECK: No JVD; No carotid bruits LYMPHATICS: No lymphadenopathy CARDIAC:RRR, no murmurs, rubs, gallops RESPIRATORY:  Clear to auscultation without rales, wheezing or rhonchi  ABDOMEN: Soft, non-tender, non-distended MUSCULOSKELETAL:  No edema; No deformity  SKIN: Warm and dry NEUROLOGIC:  Alert and oriented x 3 PSYCHIATRIC:  Normal affect  Assessment & Plan   CAD s/p remote history of CABG with LIMA to LAD, and inferior STEMI in 2022 Hyperlipidemia -High-sensitivity troponin 32 > 47 > 64.  Not c/w ACS -No further ischemic evaluation necessary at this time. - Continue aspirin  81 mg daily  - Continue Plavix  75 mg  - Continue atorvastatin  80 mg daily - Coreg  was restarted but was later stopped because of soft blood pressures and orthostatic hypotension.  Orthostatic hypotension Improving on Midodrine  5 mg TID dosing. Morning cortisol level  normal. - continue treatment as above.  Carotid artery disease -Underwent TCAR to left carotid on 04/21/2024.   -Planning to get a TCAR to the right side at a later date  CVA Patient was hospitalized for stroke.  Cardiology will sign off at this time.   For questions or updates, please contact [facility] Please consult [email or web address] for contact info under   Signed, Missy Sandhoff, MD  11:13 AM  05/12/2024 "

## 2024-05-12 NOTE — Progress Notes (Signed)
 Physical Therapy Session Note  Patient Details  Name: Christopher Roberts MRN: [id number] Date of Birth: 08-Feb-1946  Today's Date: 05/12/2024 PT Individual Time: 0904-1014 PT Individual Time Calculation (min): 70 min   Short Term Goals: Week 2:  PT Short Term Goal 1 (Week 2): STG = LTG 2/2 LOS Week 3:  PT Short Term Goal 1 (Week 2): STG = LTG 2/2 ELOS  Skilled Therapeutic Interventions/Progress Updates:  Patient supine in bed on entrance to room. Patient alert and agreeable to PT session.   Patient with no pain complaint at start of session.  Appreciated cardiologist note but with attn to old orthostatic vital signs from last Friday. No mention of orthostatic vitals entered from therapists entered since adjustments to BP medication since then.   Therapeutic Activity: Bed Mobility: Pt performed supine > sit with bed flat and pt requires increased effort and CGA with vc/ tc to push up from sidelying and reach seated position on EOB. Extreme increase in lightheadedness/ dizziness with transition requiring extensive time for slight improvement. Requires several attempts and Min/ modA to complete forward scoot in order to place feet on floor. Donned TED hose and abd in sitting with MaxA. Assisted with dressing and pt requiring MinA for UB dressing and MaxA for LB dressing d/t symptoms and need to thread catheter bag thru boxers and pants.  Transfers: Pt performed sit<>stand and stand pivot transfers throughout session with MinA and noted that pt requires BLE knee extension into seated surface for assist to attain standing balance, especially with lightheaded symptoms. Provided vc/ tc for increasing forward lean with coordinated exhale of breath to assist with reduction in symptoms.   After symptoms subside while seated on EOB with size Medium thigh high TED hose and abdominal binder donned, BP taken and noted at 120/ 86 (99), pulse 86.  Pt transported to main therapy gym dependently via w/c in order to  assess stair navigation.   Stair Training:  Pt guided in stair training with physical demonstration and verbal instructions provided prior to performance. Pt is able to ascend four 6 steps using BHR with CGA/ MinA to ascend leading with LLE. Increasing fatigue with increasing respiratory rate during ascent. Good step-to technique requiring minimal vc. Seated therapeutic rest break at top of steps with pt demonstrating 45 deep breaths/ min upon sitting that improves to within normal ranges in 1-2 min of seated rest. Pt's BP taken upon sitting at 127/75 (90), pulse 89.   With decrease in pt's symptoms, he is then able to descend four 6 steps with CGA leading with RLE. Still fatigues with descent but not as quickly or severely with ascent. VC provided at initiation of each direction for leading LE. Pt's BP taken upon sitting at 117/70 (84), pulse 91.    Patient returned to room via w/c at end of session and remains seated upright with brakes locked, belt alarm set, and all needs within reach.  Concern for close d/c date tomorrow d/t pt's recent decline in function and wife's ability to provide physical assist. Msg sent to team re: concerns.    Therapy Documentation Precautions:  Precautions Precautions: Fall Recall of Precautions/Restrictions: Impaired Precaution/Restrictions Comments: RLE weakness, RUE weakness Restrictions Weight Bearing Restrictions Per Provider Order: No  Pain:  No pain related during session. Pt does demo signs of fatigue and relates lightheadedness with transition changes.   Therapy/Group: Individual Therapy  Mliss DELENA Milliner PT, DPT, CSRS 05/12/2024, 12:40 PM

## 2024-05-12 NOTE — Plan of Care (Signed)
 Urinary retention on Flomax ; required placement of a foley for discharge. Christopher Roberts

## 2024-05-12 NOTE — Plan of Care (Signed)
" °  Problem: RH Expression Communication Goal: LTG Patient will verbally express basic/complex needs(SLP) Description: LTG:  Patient will verbally express basic/complex needs, wants or ideas with cues  (SLP) Outcome: Completed/Met Goal: LTG Patient will increase word finding of common (SLP) Description: LTG:  Patient will increase word finding of common objects/daily info/abstract thoughts with cues using compensatory strategies (SLP). Outcome: Completed/Met   Problem: RH Memory Goal: LTG Patient will use memory compensatory aids to (SLP) Description: LTG:  Patient will use memory compensatory aids to recall biographical/new, daily complex information with cues (SLP) Outcome: Completed/Met   Problem: RH Attention Goal: LTG Patient will demonstrate this level of attention during functional activites (SLP) Description: LTG:  Patient will will demonstrate this level of attention during functional activites (SLP) Outcome: Completed/Met   Problem: RH Problem Solving Goal: LTG Patient will demonstrate problem solving for (SLP) Description: LTG:  Patient will demonstrate problem solving for basic/complex daily situations with cues  (SLP) Outcome: Not Met (cont to require mina)   "

## 2024-05-12 NOTE — Progress Notes (Addendum)
 Patient ID: Christopher Roberts, male   DOB: Sep 29, 1945, 79 y.o.   MRN: [id number]  Team feels pt has not reached goals and is still not able to go up 18 stairs to the bedroom at home. Wife is here and has observed him going up three but that is all he can do. Discussed a rental hospital bed for a short time until he is able too. Both feel this would be beneficial and will place order to [employer] for this. Wife aware they will reach out to her and set up delivery . Both would like an extra day to get this in place and to move furniture. Pt feels he may do better at home just being there and will eat better. He feels he is exhausted from his medical issues and can only do so much with the energy he has. Both pt and wife are aware an MD needs to make the decision regarding discharge date, covering MD and regular MD has been securely messaged this information. Will need to await decision.   2;59 Pm MD has agreed with discharge on Wednesday 1/7. Will let pt and wife know this. Both feel much better regarding extra day

## 2024-05-13 ENCOUNTER — Other Ambulatory Visit (HOSPITAL_COMMUNITY): Payer: Self-pay

## 2024-05-13 DIAGNOSIS — R072 Precordial pain: Secondary | ICD-10-CM

## 2024-05-13 LAB — GLUCOSE, CAPILLARY
Glucose-Capillary: 137 mg/dL — ABNORMAL HIGH (ref 70–99)
Glucose-Capillary: 156 mg/dL — ABNORMAL HIGH (ref 70–99)
Glucose-Capillary: 162 mg/dL — ABNORMAL HIGH (ref 70–99)
Glucose-Capillary: 103 mg/dL — ABNORMAL HIGH (ref 70–99)

## 2024-05-13 NOTE — Progress Notes (Signed)
 Physical Therapy Session Note  Patient Details  Name: Christopher Roberts MRN: [id number] Date of Birth: 1945/06/11  Today's Date: 05/13/2024 PT Individual Time: 0802-0856 PT Individual Time Calculation (min): 54 min   Short Term Goals: Week 2:  PT Short Term Goal 1 (Week 2): STG = LTG 2/2 LOS  Skilled Therapeutic Interventions/Progress Updates:     Pt supine in bed upon arrival. Pt denies pain, endorses fatigue, and agreeable to therapy. Session emphasized functional strengthening, endurance/activity tolerance, and orthostatic HTN symptom management with bed mobility and transfers. PT donned medium thigh high TEDs and socks dependent. Pt performed supine to sitting EOB transfer with min A and ++ time. No lightheadedness/dizziness upon sitting. PT donned abdominal binder dependent in seated position and made adjustments in standing. Dizziness upon standing, pt requested to sit. BP in sitting 111/61, HR 86. Min A to stand to RW. Attempted to assess BP in standing however, pt requested to sit due to fatigue, BP 98/63, HR 129. After seated rest break, re-assessed for more accurate assessment. Sitting BP 133/58, HR 88 and standing BP 84/55, HR 85, with encouragement pt able to remain standing for full BP read. Pt stood with slightly less assist when cued to push up from bed using B UE vs single. Continues to report significant global fatigue. Pt performed stand pivot transfer to WC using RW with CGA and ++ time. BP after transfer 107/58, HR 87. MD came by to FU with pt and discuss BP and medication changes. Pt positioned for comfort in WC, encouraged to sit up tall, seat belt alarm on, and all needs in reach at end of session.  Therapy Documentation Precautions:  Precautions Precautions: Fall Recall of Precautions/Restrictions: Impaired Precaution/Restrictions Comments: RLE weakness, RUE weakness Restrictions Weight Bearing Restrictions Per Provider Order: No  Therapy/Group: Individual  Therapy  [name], PT, DPT 05/13/2024, 7:59 AM

## 2024-05-13 NOTE — Progress Notes (Signed)
 Occupational Therapy Discharge Summary  Patient Details  Name: Christopher Roberts MRN: [id number] Date of Birth: 29-Jul-1945  Date of Discharge from OT service:May 13, 2024  Today's Date: 05/13/2024 OT Individual Time: 9063-8954 OT Individual Time Calculation (min): 69 min    Patient has met 3 of 7 long term goals due to improved balance, postural control, improved attention, improved awareness, and improved coordination.  Patient to discharge at [facility] Assist level.  Patient's care partner is independent to provide the necessary physical and cognitive assistance at discharge.    Reasons goals not met: Goals not met d/t Pt regression in progress 2/2 medical changes regarding BP. See POC note for details. Pt's wife is able to provide the necessary level of assistance.   Recommendation:  Patient will benefit from ongoing skilled OT services in home health setting to continue to advance functional skills in the area of BADL, iADL, and Reduce care partner burden.  Equipment: Hospital bed, shower seat, and BSC  Reasons for discharge: treatment goals met and discharge from hospital  Patient/family agrees with progress made and goals achieved: Yes  OT Discharge Skilled Therapeutic Interventions/Progress Updates:  Pt received siting up in wc presenting to be in good spirits receptive to skilled OT session reporting 0/10 pain- OT offering intermittent rest breaks, repositioning, and therapeutic support to optimize participation in therapy session. Pt requesting to take shower this session. Transported Pt to/from bathroom in w/c for energy conservation for ADLs. He completed stand pivot transfers wc <> BSC positioned in walk-in shower using grab bar with close SUP +MAX effort. He sat for majority of shower for energy conservation and stood using grab bar to wash buttocks with MIN A required to ensure cleanliness- Pt noted to have BM seeping out. Pt dried self in seated position MIN A. Provided  time on [facility] for toileting- continent BM in toilet. MAX A required for peri-care d/t balance and endurance deficit. Following shower, U/LB dressing completed in seated position. Pt donned [place] shirt and jacket with set-up A, MIN A for LB dressing to weave catheter through pants, and TEDs/socks donned total A. Pt fatiguing very quickly throughout session requiring frequent rest breaks and modifications for ADLs. Pt was left resting in wc with call bell in reach, setbelt alarm on, and all needs met.   Precautions/Restrictions  Precautions Precautions: Fall Recall of Precautions/Restrictions: Impaired Restrictions Weight Bearing Restrictions Per Provider Order: No Pain Pain Assessment Pain Scale: 0-10 Pain Score: 0-No pain ADL ADL Eating: Modified independent Where Assessed-Eating: Chair Grooming: Modified independent Where Assessed-Grooming: Sitting at sink Upper Body Bathing: Supervision/safety Where Assessed-Upper Body Bathing: Shower Lower Body Bathing: Minimal assistance Where Assessed-Lower Body Bathing: Shower Upper Body Dressing: Modified independent (Device) Where Assessed-Upper Body Dressing: Wheelchair Lower Body Dressing: Minimal assistance, Moderate assistance Where Assessed-Lower Body Dressing: Wheelchair Toileting: Minimal assistance Where Assessed-Toileting: Teacher, Adult Education: Close supervision, Furniture Conservator/restorer Method: Ambulating, Stand pivot Acupuncturist: Gaffer: Not assessed Film/video Editor: Contact guard, Minimal assistance Film/video Editor Method: Warden/ranger: Shower seat with back Vision Baseline Vision/History: 1 Wears glasses Patient Visual Report: No change from baseline Vision Assessment?: No apparent visual deficits;Wears glasses for driving;Wears glasses for reading Perception  Perception: Impaired Perception-Other Comments: Mild R  inattention Praxis Praxis: WFL Cognition Cognition Overall Cognitive Status: Impaired/Different from baseline Arousal/Alertness: Awake/alert Orientation Level: Person;Place;Situation Person: Oriented Place: Oriented Situation: Oriented Memory: Impaired Memory Impairment: Retrieval deficit;Decreased short term memory Decreased Short Term Memory: Verbal  basic Attention: Sustained Sustained Attention: Appears intact Sustained Attention Impairment: Verbal basic;Functional basic Selective Attention: Appears intact Problem Solving: Impaired Problem Solving Impairment: Functional complex Executive Function: Organizing;Sequencing Safety/Judgment: Impaired Brief Interview for Mental Status (BIMS) Repetition of Three Words (First Attempt): 3 Temporal Orientation: Year: Correct Temporal Orientation: Month: Accurate within 5 days Temporal Orientation: Day: Correct Recall: Sock: Yes, no cue required Recall: Blue: Yes, no cue required Recall: Bed: Yes, no cue required BIMS Summary Score: 15 Sensation Sensation Light Touch: Impaired by gross assessment (RLE) Proprioception: Impaired by gross assessment (R LE) Coordination Gross Motor Movements are Fluid and Coordinated: No Fine Motor Movements are Fluid and Coordinated: No Coordination and Movement Description: limited coordination 2/2 weakness and dizziness Finger Nose Finger Test: WFL Motor  Motor Motor: Motor impersistence Motor - Discharge Observations: decreased proprioception of R LE Mobility  Bed Mobility Bed Mobility: Supine to Sit;Sit to Supine Supine to Sit: Supervision/Verbal cueing Sit to Supine: Supervision/Verbal cueing Transfers Sit to Stand: Contact Guard/Touching assist;Minimal Assistance - Patient > 75% (fluctuates depending on fatigue and dizziness) Stand to Sit: Contact Guard/Touching assist  Trunk/Postural Assessment  Cervical Assessment Cervical Assessment: Exceptions to [facility] (forward head) Thoracic  Assessment Thoracic Assessment: Exceptions to [facility] (forward flexed with rounded shoulders) Lumbar Assessment Lumbar Assessment: Exceptions to [facility] (posterior pelvic tilt) Postural Control Postural Control: Deficits on evaluation Protective Responses: delayed Postural Limitations: difficulty with upright posture; flexed with fatigue and decreased awareness  Balance Balance Balance Assessed: Yes Static Sitting Balance Static Sitting - Balance Support: Feet supported Static Sitting - Level of Assistance: 6: Modified independent (Device/Increase time) Dynamic Sitting Balance Dynamic Sitting - Balance Support: During functional activity Dynamic Sitting - Level of Assistance: 5: Stand by assistance Static Standing Balance Static Standing - Balance Support: Bilateral upper extremity supported;During functional activity Static Standing - Level of Assistance: 5: Stand by assistance Dynamic Standing Balance Dynamic Standing - Balance Support: During functional activity;Bilateral upper extremity supported Dynamic Standing - Level of Assistance: 5: Stand by assistance Extremity/Trunk Assessment RUE Assessment RUE Assessment: Exceptions to [facility] Active Range of Motion (AROM) Comments: AROM WFL General Strength Comments: 4/5 LUE Assessment LUE Assessment: Within Functional Limits Active Range of Motion (AROM) Comments: WFL   Katheryn SQUIBB Woodson 05/13/2024, 1:05 PM

## 2024-05-13 NOTE — Progress Notes (Signed)
 Physical Therapy Discharge Summary  Patient Details  Name: Christopher Roberts MRN: [id number] Date of Birth: 01/13/1946  Date of Discharge from PT service:May 13, 2024  Today's Date: 05/13/2024 PT Individual Time: 8661-8550 PT Individual Time Calculation (min): 71 min    Patient has met {NUMBERS 0-12:18577} of 9 long term goals due to {due un:6958322}.  Patient to discharge at an ambulatory level CGA.   Patient's care partner is independent to provide the necessary physical and cognitive assistance at discharge.  Reasons goals not met: Pt demos regression during inpatient stay following orthostatic hypotension and UTI. Stay extended but pt will require more time and skilled therapy in order to progress back to PLOF. Due to his comorbidities and fluctuations in cardiovascular function, he will require continuous assessment and modification of therapy needed to progress cardiorespiratory function.  Recommendation:  Patient will benefit from ongoing skilled PT services in home health setting to continue to advance safe functional mobility, address ongoing impairments in strength, coordination, balance, activity tolerance, cognition, safety awareness, and to minimize fall risk.  Equipment: No equipment provided. Pt has all PT DME needs at home.   Reasons for discharge: Pt desire to return home, Progress to LTG level prior to UTI/ orthostasis, will require longer term skilled therapy in order to guide cardiorespiratory health closer to PLOF.   Patient/family agrees with progress made and goals achieved: Yes  PT Discharge Precautions/Restrictions Precautions Precautions: Fall Precaution/Restrictions Comments: RLE weakness, RUE weakness Restrictions Weight Bearing Restrictions Per Provider Order: No  Pain Pain Assessment Pain Scale: 0-10 Pain Score: 0-No pain Pain Interference Pain Interference Pain Effect on Sleep: 0. Does not apply - I have not had any pain or hurting in the past 5  days Pain Interference with Therapy Activities: 0. Does not apply - I have not received rehabilitationtherapy in the past 5 days Pain Interference with Day-to-Day Activities: 1. Rarely or not at all Vision/Perception  Vision - History Ability to See in Adequate Light: 0 Adequate Perception Perception: Impaired Preception Impairment Details: Inattention/Neglect Perception-Other Comments: Mild R inattention/ reduced proprioception in RLE - improved from eval Praxis Praxis: WFL  Cognition Overall Cognitive Status: Impaired/Different from baseline Arousal/Alertness: Awake/alert Orientation Level: Oriented X4 Attention: Sustained Sustained Attention: Impaired Memory: Impaired Awareness: Impaired Problem Solving: Impaired Executive Function: Sequencing Sequencing: Impaired Self Correcting: Impaired (improved from eval) Safety/Judgment: Impaired (improved from eval) Sensation Sensation Light Touch: Impaired by gross assessment (RLE) Proprioception: Impaired by gross assessment (RLE) Coordination Gross Motor Movements are Fluid and Coordinated: No Fine Motor Movements are Fluid and Coordinated: No Heel Shin Test: slow to perform and needs to see RLE in order to complete - also increased generalized weakness compared to eval Motor  Motor Motor: Motor impersistence Motor - Discharge Observations: decreased proprioception of R LE  Mobility Bed Mobility Bed Mobility: Supine to Sit;Sit to Supine;Left Sidelying to Sit Left Sidelying to Sit: Supervision/Verbal cueing Supine to Sit: Supervision/Verbal cueing Sit to Supine: Supervision/Verbal cueing Transfers Transfers: Sit to Stand;Stand to Sit;Stand Pivot Transfers Sit to Stand: Supervision/Verbal cueing;Contact Guard/Touching assist Stand to Sit: Contact Guard/Touching assist;Supervision/Verbal cueing Stand Pivot Transfers: Contact Guard/Touching assist Stand Pivot Transfer Details: Verbal cues for technique;Verbal cues for  precautions/safety Transfer (Assistive device): Rolling walker Locomotion  Gait Ambulation: Yes Gait Assistance: Contact Guard/Touching assist;Supervision/Verbal cueing Gait Distance (Feet): 12 Feet Assistive device: Rolling walker Gait Assistance Details: Verbal cues for safe use of DME/AE Gait Gait: Yes Gait Pattern: Impaired Gait Pattern: Decreased hip/knee flexion - right;Right flexed knee in stance;Decreased stride  length Gait velocity: significantly decreased Stairs / Additional Locomotion Stairs: Yes Stairs Assistance: Minimal Assistance - Patient > 75% Stair Management Technique: Two rails Number of Stairs: 4 Height of Stairs: 6 Pick up small object from the floor assist level: Maximal Assistance - Patient 25 - 49% Wheelchair Mobility Wheelchair Mobility: Yes Wheelchair Assistance: Doctor, General Practice: Both upper extremities Wheelchair Parts Management: Needs assistance  Trunk/Postural Assessment  Cervical Assessment Cervical Assessment: Exceptions to [facility] (forward head) Thoracic Assessment Thoracic Assessment: Exceptions to [facility] (forward flexed with rounded shoulders) Lumbar Assessment Lumbar Assessment: Exceptions to [facility] (posterior pelvic tilt) Postural Control Postural Control: Deficits on evaluation Righting Reactions: present but delayed Protective Responses: delayed Postural Limitations: difficulty with upright posture; flexed with fatigue and decreased awareness  Balance Balance Balance Assessed: Yes Static Sitting Balance Static Sitting - Balance Support: Feet supported Static Sitting - Level of Assistance: 6: Modified independent (Device/Increase time) Dynamic Sitting Balance Dynamic Sitting - Balance Support: During functional activity Dynamic Sitting - Level of Assistance: 6: Modified independent (Device/Increase time);5: Stand by assistance Sitting balance - Comments: sitting up on edge of bed Static Standing  Balance Static Standing - Balance Support: Bilateral upper extremity supported;During functional activity Static Standing - Level of Assistance: 5: Stand by assistance Static Standing - Comment/# of Minutes: limited time in stance d/t variable lightheadedness with orthostasis (improving with med adjustment), and reduced cardiovascular health Dynamic Standing Balance Dynamic Standing - Balance Support: During functional activity;Bilateral upper extremity supported Dynamic Standing - Level of Assistance: 5: Stand by assistance Extremity Assessment      RLE Assessment RLE Assessment: Exceptions to [facility] RLE Strength Right Hip Flexion: 3+/5 Right Hip Extension: 3+/5 Right Hip ABduction: 3+/5 Right Hip ADduction: 4-/5 Right Knee Flexion: 3+/5 Right Knee Extension: 3+/5 Right Ankle Dorsiflexion: 4-/5 Right Ankle Plantar Flexion: 4-/5 LLE Assessment LLE Assessment: Exceptions to [facility] LLE Strength Left Hip Flexion: 4/5 Left Hip Extension: 4/5 Left Hip ABduction: 4-/5 Left Hip ADduction: 4/5 Left Knee Flexion: 4-/5 Left Knee Extension: 4-/5 Left Ankle Dorsiflexion: 4/5 Left Ankle Plantar Flexion: 4/5  Skilled Intervention: PT instructed pt in Grad day assessment to measure progress toward goals. See above for details. CARETool mobility assessment  also completed; see CAREtool tab in navigator for details.  Patient seated upright in w/c on entrance to room. Patient alert and agreeable to PT session. Is extremely fatigued and relates desire to return to bed. Wife is present and family education initiated for pt's return home tomorrow.   Patient with no pain complaint at start of session. Endorses extreme fatigue and desire to return to bed.   Discussion with wife and pt re: necessity for hospital bed. Wife with belief that home health therapy can help to get him up/ down stairs when necessary. Discussed limited availability and time during week with home health. HH therapists will  definitely work on pt's ability to complete stairs but his significant reduction in cardorespiratory health/ endurance/ activity tolerance will require continued assessment.   Pt agreeable to ambulate this session in order to continue to progress, but also to allow for wife to return demo ability to provide appropriate and safe assist during transfer. Does tend to pull on gait belt upward and reminded wife to hold with relaxed readines.   ability to provide CGA for ambulatoin within the hom  Gait Training:  Pt ambulated *** ft using *** with ***. Demonstrated ***. Provided vc/ tc for ***.  Wheelchair Mobility:  Pt propelled wheelchair *** feet with ***. Provided  vc/ tc for ***.  Neuromuscular Re-ed: NMR facilitated during session with focus on ***. Pt guided in ***. NMR performed for improvements in motor control and coordination, balance, sequencing, judgement, and self confidence/ efficacy in performing all aspects of mobility at highest level of independence.   Therapeutic Exercise: Pt performed the following exercises with vc/ tc for proper technique. ***  Patient *** at end of session with brakes locked, *** alarm set, and all needs within reach.    Mliss DELENA Milliner 05/13/2024, 5:05 PM

## 2024-05-13 NOTE — Plan of Care (Signed)
" °  Problem: RH Bathing Goal: LTG Patient will bathe all body parts with assist levels (OT) Description: LTG: Patient will bathe all body parts with assist levels (OT) Outcome: Not Met (add Reason) Note: Pt requires Min A for washing buttocks   Problem: RH Dressing Goal: LTG Patient will perform lower body dressing w/assist (OT) Description: LTG: Patient will perform lower body dressing with assist, with/without cues in positioning using equipment (OT) Outcome: Not Met (add Reason) Note: Pt requires assistance for weaving feet and catheter management   Problem: RH Toileting Goal: LTG Patient will perform toileting task (3/3 steps) with assistance level (OT) Description: LTG: Patient will perform toileting task (3/3 steps) with assistance level (OT)  Outcome: Not Met (add Reason) Note: Pt requires MIN a for posterior peri-care   Problem: RH Toilet Transfers Goal: LTG Patient will perform toilet transfers w/assist (OT) Description: LTG: Patient will perform toilet transfers with assist, with/without cues using equipment (OT) Outcome: Not Met (add Reason) Note: Pt requires CGA for stand pivot toilet transfer using RW d/t weakness and dizziness   Problem: RH Grooming Goal: LTG Patient will perform grooming w/assist,cues/equip (OT) Description: LTG: Patient will perform grooming with assist, with/without cues using equipment (OT) Outcome: Completed/Met   Problem: RH Dressing Goal: LTG Patient will perform upper body dressing (OT) Description: LTG Patient will perform upper body dressing with assist, with/without cues (OT). Outcome: Completed/Met   Problem: RH Functional Use of Upper Extremity Goal: LTG Patient will use RT/LT upper extremity as a (OT) Description: LTG: Patient will use right/left upper extremity as a stabilizer/gross assist/diminished/nondominant/dominant level with assist, with/without cues during functional activity (OT) Outcome: Completed/Met   "

## 2024-05-13 NOTE — Progress Notes (Addendum)
 "                                                        PROGRESS NOTE   Subjective/Complaints:  Appreciate cards note Off carvedilol  and on lower dose midodrine    ROS: as per HPI. Denies CP, SOB, abd pain, N/V/D/C, or any other complaints at this time.   + Occasional dizziness  Objective:   No results found.  Recent Labs    05/12/24 0550  WBC 3.7*  HGB 7.6*  HCT 22.5*  PLT 56*   Recent Labs    05/12/24 0550  NA 136  K 4.1  CL 107  CO2 20*  GLUCOSE 139*  BUN 29*  CREATININE 1.19  CALCIUM  7.8*    Intake/Output Summary (Last 24 hours) at 05/13/2024 0832 Last data filed at 05/13/2024 0700 Gross per 24 hour  Intake 476 ml  Output 700 ml  Net -224 ml        Physical Exam: Vital Signs Blood pressure 100/67, pulse 79, temperature 98.8 F (37.1 C), temperature source Oral, resp. rate 19, height 5' 10 (1.778 m), weight 75.6 kg, SpO2 100%.   General: No acute distress, working with therapy in his room Mood and affect are appropriate, a little flat Heart: Regular rate and rhythm no rubs murmurs or extra sounds Lungs: Clear to auscultation, breathing unlabored, no rales or wheezes Abdomen: Positive bowel sounds, soft nontender to palpation, nondistended Extremities: No clubbing, cyanosis, or edema Skin: No evidence of breakdown, no evidence of rash to exposed surfaces, scattered bruises, L cartoid surgical incision remains c/d/I Neuro: Alert and awake, cranial nerves II through XII grossly intact, moving all 4 extremities in bed  PRIOR EXAMS: Neurologic: Cranial nerves II through XII intact, motor strength is 5/5 in left UE/LE and 5/5 right deltoid, bicep, tricep, grip, 4/5 hip flexor, knee extensors, ankle dorsiflexor and plantar flexor Sensory exam normal sensation to light touch  in bilateral upper and left lower lower extremities Reduced LT RLE Oriented to person and place, month, day Cerebellar exam mild dysmetria Right finger nose finger  Musculoskeletal:  Full range of motion in all 4 extremities. No joint swelling    Assessment/Plan: 1. Functional deficits which require 3+ hours per day of interdisciplinary therapy in a comprehensive inpatient rehab setting. Physiatrist is providing close team supervision and 24 hour management of active medical problems listed below. Physiatrist and rehab team continue to assess barriers to discharge/monitor patient progress toward functional and medical goals  Care Tool:  Bathing    Body parts bathed by patient: Right arm, Left upper leg, Left arm, Right lower leg, Chest, Left lower leg, Abdomen, Face, Front perineal area, Buttocks, Right upper leg         Bathing assist Assist Level: Supervision/Verbal cueing     Upper Body Dressing/Undressing Upper body dressing   What is the patient wearing?: Pull over shirt    Upper body assist Assist Level: Supervision/Verbal cueing    Lower Body Dressing/Undressing Lower body dressing      What is the patient wearing?: Underwear/pull up, Pants     Lower body assist Assist for lower body dressing: Contact Guard/Touching assist     Toileting Toileting    Toileting assist Assist for toileting: Maximal Assistance - Patient 25 - 49%     Transfers Chair/bed  transfer  Transfers assist     Chair/bed transfer assist level: Contact Guard/Touching assist     Locomotion Ambulation   Ambulation assist      Assist level: Contact Guard/Touching assist Assistive device: Walker-rolling Max distance: 100 feet   Walk 10 feet activity   Assist     Assist level: Contact Guard/Touching assist Assistive device: Walker-rolling   Walk 50 feet activity   Assist Walk 50 feet with 2 turns activity did not occur: Safety/medical concerns  Assist level: Contact Guard/Touching assist Assistive device: Walker-rolling    Walk 150 feet activity   Assist Walk 150 feet activity did not occur: Safety/medical concerns (fatigue/SOB)          Walk 10 feet on uneven surface  activity   Assist Walk 10 feet on uneven surfaces activity did not occur: Safety/medical concerns         Wheelchair     Assist Is the patient using a wheelchair?: Yes Type of Wheelchair: Manual    Wheelchair assist level: Dependent - Patient 0% Max wheelchair distance: 15 ft    Wheelchair 50 feet with 2 turns activity    Assist        Assist Level: Dependent - Patient 0%   Wheelchair 150 feet activity     Assist      Assist Level: Dependent - Patient 0%   Blood pressure 100/67, pulse 79, temperature 98.8 F (37.1 C), temperature source Oral, resp. rate 19, height 5' 10 (1.778 m), weight 75.6 kg, SpO2 100%.  Medical Problem List and Plan: 1. 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  Antithrombotics: -DVT/anticoagulation:  Mechanical: Antiembolism stockings, thigh (TED hose) Bilateral lower extremities             -antiplatelet therapy: Aspirin  81 mg daily and Plavix  75 mg daily  3. Pain Management: Tylenol  as needed  4. Mood/Behavior/Sleep: Prozac  20 mg daily.             -antipsychotic agents: N/A  -melatonin prn  5. Neuropsych/cognition: This patient is capable of making decisions on his own behalf.  6. Skin/Wound Care: Routine skin checks  7. Fluids/Electrolytes/Nutrition: Routine and analysis with follow-up chemistries  8.  Symptomatic left carotid artery stenosis.  Status post stenting/angioplasty 04/21/2024 per Dr. Gretta  9.  Hypotension: off ProAmatine  10 mg 3 times daily.  Monitor with  increased mobility -05/10/24 BPs soft and orthostatic, midodrine  restarted at 2.5mg  TID-- increased today to 5mg  TID-- appreciate their assistance.  -05/11/24 BPs better today, no orthostatics done yet, cardiology following, mentions doing 8am cortisol level as well. F/up on these -1/5 BP appears improved overall, continue midodrine , cardiology following appreciate assistance.  A.m. cortisol 12.8 Vitals:   05/09/24 1803 05/09/24 1940 05/10/24 0549 05/10/24 1348  BP: 127/66 123/69 (!) 104/57 122/76   05/10/24 2111 05/11/24 0440 05/11/24 1429 05/11/24 1952  BP: (!) 112/55 (!) 119/54 128/67 121/69   05/12/24 0514 05/12/24 1515 05/12/24 1944 05/13/24 0500  BP: ROLLEN)  118/58 121/71 134/72 100/67    10.  Diabetes mellitus.  Hemoglobin A1c 5.6.  Currently on SSI.  Prior to admission patient on Glucophage  500 mg daily.  Resume as needed CBGs ok if they start climbing above 180 will restart metformin   -1/3-4/26 having some afternoon/early evening elevations >180 but mostly 130-170s, hold off on Metformin  for now but might consider restarting.   CBG (last 3)  Recent Labs    05/12/24 1151 05/12/24 2144 05/13/24 0635  GLUCAP 178* 118* 137*    11.  Hyperlipidemia: continue Lipitor  80mg  daily  12.  History of CAD/PCI/CABG/SVT.  Follow-up [name] outpatient  13.  Pancytopenia.  Heme oncology Dr.Pasam outpatient.  Follow-up CBC -No heparin  for DVT prophyllaxis, monitor for bleeding on DAPT transfuse if PLT<20K or Hgb <7.0 Hgb stable  foley bag light pink tinge, no clots, some of the drop in Hgb is dilutional due to rehydration with IVF  -1/5 appears overall stable from prior continue to monitor    Latest Ref Rng & Units 05/12/2024    5:50 AM 05/09/2024    5:23 AM 05/08/2024    6:08 AM  CBC  WBC 4.0 - 10.5 K/uL 3.7   2.4   Hemoglobin 13.0 - 17.0 g/dL 7.6  7.5  7.6   Hematocrit 39.0 - 52.0 % 22.5  21.9  22.1   Platelets 150 - 400 K/uL 56   43     14.  Cirrhosis of liver.  Imaging suggestive of  cirrhosis.  Bili mildly elevated as well as INR 1.3.  Follow-up outpatient  15.  Urinary retention.  In-N-Out catheterization 12/13 x 1.  Placed on Flomax . Messaged nursing to discuss her PVRs. -04/27/24 flomax  d/c'd yesterday, no PVRs documented, will discuss with nursing-- ordered PVRs -12/25 PVRs variable. Had to be cathed once yesterday for pvr of 350.   -continue PVR's for now -05/03/24 only a few PVRs documented, all low, no caths; spiked fever last night, checking labs/urine/cxr as below. -05/04/24 labs yesterday stable/ok, but U/A looking iffy with cloudy urine, trace leuks, 21-50 WBC though no bacteria-- however pt not feeling well today and having incontinence, highly suspect UTI-- STARTING Keflex  500mg  TID x7d, UCx  Proteus mirabilis , >100K S to Keflex   Restart flomax  as PVRs are now up , no anticholinergic meds identified - monitor BPs on Flomax  which was held due to low BPs -05/10/24 foley in place, doesn't want to try TOV-- flomax  stopped 05/09/24  16.  Constipation: continue Colace 100 mg daily -05/11/24 LBM yesterday per pt but not documented -1/5 LBM today continue current regimen  17. Fever 05/02/24 due to UTI resolved  18.  Pre renal azotemia - IVF at bedtime but change to .9NS recheck BMPstable, BUN still up cont IVF for now-- stopped 05/09/24 1/5 BUN stable at 29, creatinine slightly higher at 1.19.  Encourage oral fluids    Latest Ref Rng & Units 05/12/2024    5:50 AM 05/09/2024    5:23 AM 05/08/2024    6:08 AM  BMP  Glucose 70 - 99 mg/dL 860  863  878   BUN 8 - 23 mg/dL 29  28  30    Creatinine 0.61 - 1.24 mg/dL 8.80  8.86  8.93   Sodium 135 - 145 mmol/L 136  138  137   Potassium 3.5 - 5.1 mmol/L 4.1  4.0  4.0   Chloride 98 - 111 mmol/L 107  108  108   CO2 22 - 32 mmol/L 20  21  20   Calcium  8.9 - 10.3 mg/dL 7.8  7.8  8.1   1/6 BUN still running a little hi, rec po fluids, only recorded yesterday - goal is   19.  Angina- Resolved no evidence of MI, now on  Coreg  and off midodrine  but then coreg  stopped and midodrine  restarted d/t concerns about hypotension monitor closely-- see #9. Cardiology following.     LOS: 19 days A FACE TO FACE EVALUATION WAS PERFORMED  Prentice FORBES Compton 05/13/2024, 8:32 AM     "

## 2024-05-13 NOTE — Telephone Encounter (Signed)
" °  Called patient to confirm appt for 05/14/2024  Spoke to his wife who said he is in the hospital and has had a stroke just around the holidays and they will reschedule when he feels better   MP "

## 2024-05-14 ENCOUNTER — Other Ambulatory Visit (HOSPITAL_COMMUNITY): Payer: Self-pay

## 2024-05-14 DIAGNOSIS — I951 Orthostatic hypotension: Secondary | ICD-10-CM

## 2024-05-14 LAB — GLUCOSE, CAPILLARY: Glucose-Capillary: 119 mg/dL — ABNORMAL HIGH (ref 70–99)

## 2024-05-14 NOTE — Progress Notes (Signed)
 Discharged instructions provided by PA, medications received by TOC. Staff assisted patient off the unit; patient discharged via private car with wife.   Geni Armor, LPN

## 2024-05-14 NOTE — Plan of Care (Signed)
" °  Problem: RH Ambulation Goal: LTG Patient will ambulate in controlled environment (PT) Description: LTG: Patient will ambulate in a controlled environment, # of feet with assistance (PT). Outcome: Progressing Flowsheets (Taken 05/14/2024 0549) LTG: Pt will ambulate in controlled environ  assist needed:: Contact Guard/Touching assist LTG: Ambulation distance in controlled environment: up to 15 ft using RW Note: Limited by low activity tolerance and decreased general strength 2/2 recent orthostasis and UTI Goal: LTG Patient will ambulate in home environment (PT) Description: LTG: Patient will ambulate in home environment, # of feet with assistance (PT). Outcome: Progressing Flowsheets (Taken 05/14/2024 0549) LTG: Pt will ambulate in home environ  assist needed:: Contact Guard/Touching assist LTG: Ambulation distance in home environment: 15 ft using RW Note: Limited by low activity tolerance and decreased general strength 2/2 recent orthostasis and UTI   Problem: RH Stairs Goal: LTG Patient will ambulate up and down stairs w/assist (PT) Description: LTG: Patient will ambulate up and down # of stairs with assistance (PT) Outcome: Progressing Flowsheets (Taken 05/14/2024 0549) LTG: Pt will ambulate up/down stairs assist needed:: Minimal Assistance - Patient > 75% LTG: Pt will  ambulate up and down number of stairs: 3-4 steps at a time and requires seated rest for recovery using stair adjusted shower chair Note: Limited by low activity tolerance and decreased general strength 2/2 recent orthostasis and UTI   Problem: RH Bed Mobility Goal: LTG Patient will perform bed mobility with assist (PT) Description: LTG: Patient will perform bed mobility with assistance, with/without cues (PT). Outcome: Adequate for Discharge Flowsheets (Taken 05/14/2024 0546) LTG: Pt will perform bed mobility with assistance level of: Supervision/Verbal cueing Note: Limited by low activity tolerance and decreased general  strength - requires cues for sidelying technique.   Problem: RH Bed to Chair Transfers Goal: LTG Patient will perform bed/chair transfers w/assist (PT) Description: LTG: Patient will perform bed to chair transfers with assistance (PT). Outcome: Adequate for Discharge Flowsheets (Taken 05/14/2024 0549) LTG: Pt will perform Bed to Chair Transfers with assistance level: Contact Guard/Touching assist Note: Limited by low activity tolerance and decreased general strength - continues to progress.    Problem: RH Car Transfers Goal: LTG Patient will perform car transfers with assist (PT) Description: LTG: Patient will perform car transfers with assistance (PT). Outcome: Adequate for Discharge Flowsheets (Taken 05/14/2024 0549) LTG: Pt will perform car transfers with assist:: Contact Guard/Touching assist Note: Limited by low activity tolerance and decreased general strength   Problem: RH Furniture Transfers Goal: LTG Patient will perform furniture transfers w/assist (OT/PT) Description: LTG: Patient will perform furniture transfers  with assistance (OT/PT). Outcome: Adequate for Discharge Flowsheets (Taken 05/14/2024 0549) LTG: Pt will perform furniture transfers with assist:: Contact Guard/Touching assist Note: Limited by low activity tolerance and decreased general strength   Problem: RH Balance Goal: LTG Patient will maintain dynamic standing balance (PT) Description: LTG:  Patient will maintain dynamic standing balance with assistance during mobility activities (PT) Outcome: Completed/Met Flowsheets (Taken 04/25/2024 1019) LTG: Pt will maintain dynamic standing balance during mobility activities with:: Supervision/Verbal cueing Note: Limited by low activity tolerance   Problem: Sit to Stand Goal: LTG:  Patient will perform sit to stand with assistance level (PT) Description: LTG:  Patient will perform sit to stand with assistance level (PT) Outcome: Completed/Met Flowsheets (Taken  05/14/2024 0546) LTG: PT will perform sit to stand in preparation for functional mobility with assistance level: Supervision/Verbal cueing Note: Limited by low activity tolerance and decreased general strength   "

## 2024-05-14 NOTE — Progress Notes (Signed)
 "                                                        PROGRESS NOTE   Subjective/Complaints:  Appreciate PT note , cards has signed off   ROS: as per HPI. Denies CP, SOB, abd pain, N/V/D/C, or any other complaints at this time.   + Occasional dizziness  Objective:   No results found.  Recent Labs    05/12/24 0550  WBC 3.7*  HGB 7.6*  HCT 22.5*  PLT 56*   Recent Labs    05/12/24 0550  NA 136  K 4.1  CL 107  CO2 20*  GLUCOSE 139*  BUN 29*  CREATININE 1.19  CALCIUM  7.8*    Intake/Output Summary (Last 24 hours) at 05/14/2024 0802 Last data filed at 05/14/2024 0756 Gross per 24 hour  Intake 220 ml  Output 450 ml  Net -230 ml        Physical Exam: Vital Signs Blood pressure (!) 110/51, pulse 77, temperature 98.3 F (36.8 C), temperature source Oral, resp. rate 18, height 5' 10 (1.778 m), weight 75.6 kg, SpO2 95%.   General: No acute distress, working with therapy in his room Mood and affect are appropriate, a little flat Heart: Regular rate and rhythm no rubs murmurs or extra sounds Lungs: Clear to auscultation, breathing unlabored, no rales or wheezes Abdomen: Positive bowel sounds, soft nontender to palpation, nondistended Extremities: No clubbing, cyanosis, or edema Skin: No evidence of breakdown, no evidence of rash to exposed surfaces, scattered bruises, L cartoid surgical incision remains c/d/I Neuro: Alert and awake, cranial nerves II through XII grossly intact, moving all 4 extremities in bed   Neurologic: Cranial nerves II through XII intact, motor strength is 5/5 in left UE/LE and 5/5 right deltoid, bicep, tricep, grip, 4/5 hip flexor, knee extensors, ankle dorsiflexor and plantar flexor  Oriented to person and place, month, day Cerebellar exam mild dysmetria Right finger nose finger  Musculoskeletal: Full range of motion in all 4 extremities. No joint swelling    Assessment/Plan: 1. 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 Stable for D/C  today F/u PCP in 3-4 weeks F/u Neuro 1-2 mo F/u Dr Gretta [facility]- urinary retention home with foley  F/u Dr Conchetta for heme/onc pancytopenia F/u Dr Jordan cardiology for hypotension and angina See D/C summary See D/C instructions   Care Tool:  Bathing    Body parts bathed by patient: Right arm, Left upper leg, Left arm, Right lower leg, Chest, Left lower leg, Abdomen, Face, Front perineal area, Right upper leg         Bathing assist Assist Level: Minimal Assistance - Patient > 75%     Upper Body Dressing/Undressing Upper body dressing   What is the patient wearing?: Pull over shirt    Upper body assist Assist Level: Minimal Assistance - Patient > 75%    Lower Body Dressing/Undressing Lower body dressing      What is the patient wearing?: Underwear/pull up, Pants     Lower body assist Assist for lower body dressing: Moderate Assistance - Patient 50 - 74%     Toileting Toileting    Toileting assist Assist for toileting: Minimal Assistance - Patient > 75%     Transfers Chair/bed transfer  Transfers assist  Chair/bed transfer assist level: Contact Guard/Touching assist     Locomotion Ambulation   Ambulation assist      Assist level: Contact Guard/Touching assist Assistive device: Walker-rolling Max distance: 15 ft   Walk 10 feet activity   Assist     Assist level: Contact Guard/Touching assist Assistive device: Walker-rolling   Walk 50 feet activity   Assist Walk 50 feet with 2 turns activity did not occur: Safety/medical concerns  Assist level: Contact Guard/Touching assist Assistive device: Walker-rolling    Walk 150 feet activity   Assist Walk 150 feet activity did not occur: Safety/medical concerns         Walk 10 feet on uneven surface  activity   Assist Walk 10 feet on uneven surfaces activity did not occur: Safety/medical concerns         Wheelchair     Assist Is the patient using a  wheelchair?: Yes Type of Wheelchair: Manual    Wheelchair assist level: Minimal Assistance - Patient > 75%, Contact Guard/Touching assist Max wheelchair distance: 30 ft    Wheelchair 50 feet with 2 turns activity    Assist        Assist Level: Minimal Assistance - Patient > 75%   Wheelchair 150 feet activity     Assist      Assist Level: Maximal Assistance - Patient 25 - 49%   Blood pressure (!) 110/51, pulse 77, temperature 98.3 F (36.8 C), temperature source Oral, resp. rate 18, height 5' 10 (1.778 m), weight 75.6 kg, SpO2 95%.  Medical Problem List and Plan: 1. 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  Antithrombotics: -DVT/anticoagulation:  Mechanical: Antiembolism stockings, thigh (TED hose) Bilateral lower extremities             -antiplatelet therapy: Aspirin  81 mg daily and Plavix  75 mg daily  3. Pain Management: Tylenol  as needed  4. Mood/Behavior/Sleep: Prozac  20 mg daily.             -antipsychotic agents: N/A  -melatonin prn  5. Neuropsych/cognition: This patient is capable of making decisions on his own behalf.  6. Skin/Wound Care: Routine skin checks  7. Fluids/Electrolytes/Nutrition: Routine and analysis with follow-up chemistries  8.  Symptomatic left carotid artery stenosis.  Status post stenting/angioplasty 04/21/2024 per Dr. Gretta  9.  Hypotension: off ProAmatine  10 mg 3 times daily.  Monitor with increased mobility -05/10/24 BPs soft and orthostatic, midodrine  restarted at 2.5mg  TID-- increased today to 5mg  TID-- appreciate their assistance.  -05/11/24 BPs better today, no orthostatics done yet, cardiology following, mentions doing  8am cortisol level as well. F/up on these -1/5 BP appears improved overall, continue midodrine , cardiology following appreciate assistance.  A.m. cortisol 12.8 Vitals:   05/10/24 1348 05/10/24 2111 05/11/24 0440 05/11/24 1429  BP: 122/76 (!) 112/55 (!) 119/54 128/67   05/11/24 1952 05/12/24 0514 05/12/24 1515 05/12/24 1944  BP: 121/69 (!) 118/58 121/71 134/72   05/13/24 0500 05/13/24 1320 05/13/24 2003 05/14/24 0635  BP: 100/67 112/64 117/64 (!) 110/51    10.  Diabetes mellitus.  Hemoglobin A1c 5.6.  Currently on SSI.  Prior to admission patient on Glucophage   500 mg daily.  Resume as needed CBGs ok if they start climbing above 180 will restart metformin   -1/3-4/26 having some afternoon/early evening elevations >180 but mostly 130-170s, hold off on Metformin  for now but might consider restarting.   CBG (last 3)  Recent Labs    05/13/24 1644 05/13/24 2121 05/14/24 0544  GLUCAP 162* 103* 119*    11.  Hyperlipidemia: continue Lipitor  80mg  daily  12.  History of CAD/PCI/CABG/SVT.  Follow-up [name] outpatient  13.  Pancytopenia.  Heme oncology Dr.Pasam outpatient.  Follow-up CBC -No heparin  for DVT prophyllaxis, monitor for bleeding on DAPT transfuse if PLT<20K or Hgb <7.0 Hgb stable  foley bag light pink tinge, no clots, some of the drop in Hgb is dilutional due to rehydration with IVF  -1/5 appears overall stable from prior continue to monitor    Latest Ref Rng & Units 05/12/2024    5:50 AM 05/09/2024    5:23 AM 05/08/2024    6:08 AM  CBC  WBC 4.0 - 10.5 K/uL 3.7   2.4   Hemoglobin 13.0 - 17.0 g/dL 7.6  7.5  7.6   Hematocrit 39.0 - 52.0 % 22.5  21.9  22.1   Platelets 150 - 400 K/uL 56   43     14.  Cirrhosis of liver.  Imaging suggestive of cirrhosis.  Bili mildly elevated as well as INR 1.3.  Follow-up outpatient  15.  Urinary retention.  In-N-Out catheterization 12/13 x 1.  Placed on Flomax . Messaged nursing to discuss her PVRs. -04/27/24 flomax  d/c'd yesterday, no  PVRs documented, will discuss with nursing-- ordered PVRs -12/25 PVRs variable. Had to be cathed once yesterday for pvr of 350.   -continue PVR's for now -05/03/24 only a few PVRs documented, all low, no caths; spiked fever last night, checking labs/urine/cxr as below. -05/04/24 labs yesterday stable/ok, but U/A looking iffy with cloudy urine, trace leuks, 21-50 WBC though no bacteria-- however pt not feeling well today and having incontinence, highly suspect UTI-- STARTING Keflex  500mg  TID x7d, UCx  Proteus mirabilis , >100K S to Keflex   Restart flomax  as PVRs are now up , no anticholinergic meds identified - monitor BPs on Flomax  which was held due to low BPs -05/10/24 foley in place, doesn't want to try TOV-- flomax  stopped 05/09/24  16.  Constipation: continue Colace 100 mg daily -05/11/24 LBM yesterday per pt but not documented -1/5 LBM today continue current regimen  17. Fever 05/02/24 due to UTI resolved  18.  Pre renal azotemia - IVF at bedtime but change to .9NS recheck BMPstable, BUN still up cont IVF for now-- stopped 05/09/24 1/5 BUN stable at 29, creatinine slightly higher at 1.19.  Encourage oral fluids    Latest Ref Rng & Units 05/12/2024    5:50 AM 05/09/2024    5:23 AM 05/08/2024    6:08 AM  BMP  Glucose 70 - 99 mg/dL 860  863  878   BUN 8 - 23 mg/dL 29  28  30    Creatinine 0.61 - 1.24 mg/dL 8.80  8.86  8.93   Sodium 135 - 145 mmol/L 136  138  137   Potassium 3.5 - 5.1 mmol/L 4.1  4.0  4.0   Chloride 98 - 111 mmol/L 107  108  108   CO2 22 - 32 mmol/L 20  21  20    Calcium  8.9 - 10.3 mg/dL 7.8  7.8  8.1   1/6 BUN still running a little hi, rec po  fluids, only recorded yesterday - goal is   19.  Angina- Resolved no evidence of MI, now on Coreg  and off midodrine  but then coreg  stopped and midodrine  restarted d/t concerns about hypotension monitor closely-- see #9. Cardiology following.     LOS: 20 days A FACE TO FACE EVALUATION WAS PERFORMED  Christopher Roberts 05/14/2024, 8:02 AM     "

## 2024-05-14 NOTE — Progress Notes (Signed)
 Inpatient Rehabilitation Care Coordinator Discharge Note   Patient Details  Name: Christopher Roberts MRN: [id number] Date of Birth: May 11, 1945   Discharge location: HOME WITH WIFE AND GRANDDAUGHTER'S 24/7 CARE  Length of Stay: 20 DAYS  Discharge activity level: SUPERVISION-MIN LEVEL  Home/community participation: ACTIVE  Patient response un:Yzjouy Literacy - How often do you need to have someone help you when you read instructions, pamphlets, or other written material from your doctor or pharmacy?: Sometimes  Patient response un:Dnrpjo Isolation - How often do you feel lonely or isolated from those around you?: Never  Services provided included: MD, RD, PT, OT, SLP, RN, CM, TR, Pharmacy, Neuropsych, SW  Financial Services:  Field Seismologist Utilized: Media Planner [facility] MEDICARE  Choices offered to/list presented to: PT AND WIFE  Follow-up services arranged:  Home Health, DME, Patient/Family has no preference for HH/DME agencies Home Health Agency: [facility]  PT  OT  SP  RN    DME : [facility]    Patient response to transportation need: Is the patient able to respond to transportation needs?: Yes In the past 12 months, has lack of transportation kept you from medical appointments or from getting medications?: No In the past 12 months, has lack of transportation kept you from meetings, work, or from getting things needed for daily living?: No   Patient/Family verbalized understanding of follow-up arrangements:  Yes  Individual responsible for coordination of the follow-up plan: PAULETTE-WIFE [phone]  Confirmed correct DME delivered: Raymonde Asberry MATSU 05/14/2024    Comments (or additional information):PT HAD MEDICAL ISSUES AND THIS IS THE REASON FORE THE EXTENSION IN STAY. WIFE HAS BEEN EDUCATED ON HIS CARE AND SHE IS COMFORTABLE WITH THIS. PT READY TO GO HOME  Summary of Stay    Date/Time Discharge Planning CSW  05/05/24  [phone] Attempting to get wife in to go through therapies with husband, they are raising five great granddaughter's. Have ordered 3 in 1 and home health arranged via [facility]. Fam edu on 12/29 with pt wife 69m-4pm. AAC  05/02/24 1019 Attempting to get wife in to go through therapies with husband, they are raising five great granddaughter's. Have ordered 3 in 1 and home health arranged via [facility] BGD  04/29/24 1418 Home with wife and great grandchildren whom they raise-will await team's recommendations and work on plan BGD       Solon Alban G

## 2024-05-19 ENCOUNTER — Inpatient Hospital Stay

## 2024-05-19 ENCOUNTER — Inpatient Hospital Stay: Admitting: Oncology

## 2024-05-21 ENCOUNTER — Ambulatory Visit: Payer: Self-pay

## 2024-05-21 NOTE — Telephone Encounter (Signed)
 FYI Only or Action Required?: FYI only for provider: has appointment with urology at 11:45 AM today per patient spouse .  Patient was last seen in primary care on 03/12/2024 by [name], Quintin PARAS, MD.  Called Nurse Triage reporting urinary symptoms.  Symptoms began yesterday.  Interventions attempted: Other: fluids.  Symptoms are: stable.  Triage Disposition: See Physician Within 24 Hours  Patient/caregiver understands and will follow disposition?: Yes            Reason for Disposition  [1] Cloudy urine lasts > 24 hours AND [2] not cleared by increased fluid intake  Answer Assessment - Initial Assessment Questions Call placed to patient to discuss. Spoke with spouse (DPR) symptoms started yesterday , prior to that the foley the tube became very cloudy. There was  nurse at the house and mentioned to that nurse , but had urology appointment today but was cancelled , but they rescheduled to 1145 AM.  The disoriented is blurting out questions nothing to do with anything is aware of time/person place is not hallucinating .  Needs the foley attended to. Nurse has been at hose has not done catheter. Its been in two weeks since hospital discharge , after his stroke.  Advised spouse plan for urology appointment at 11:45 is appropriate  so they can assess catheter and urine at visit spouse is agreeable and going to bring him to appointment and will bring to ER if worsening symptoms  changes in mental status in meantime . FYI to office.   1. SYMPTOMS: What symptoms are you concerned about?     Urine cloudy in tubing  2. ONSET:  When did the symptoms start?     Yesterday  3. FEVER: Do you have a fever? If Yes, ask: What is the temperature, how was it measured, and when did it start?     Denies  4. ABDOMEN PAIN: Is there any abdomen pain? (Scale 1-10; or mild, moderate, severe)     Denies  5. URINE COLOR: What color is the urine?  Is there blood present in the urine? (e.g.,  clear, yellow, cloudy, tea-colored, blood streaks, bright red)     Cloudy yellow not pink tinge or ed  6. URINE AMOUNT: When did you last empty the urine from the collection bag? How much urine was in the bag at that time? How much urine is in the collection bag now?     Not reported  7. INSERTION: How long have you (they) had the catheter?     2 weeks placed at hospital  8. OTHER SYMPTOMS: Are there any other symptoms? (e.g., abdomen swelling, back pain, bladder spasms, constipation, foul smelling urine, leaking of urine)      No leaking of urine, no other symptoms other than spouse reporting he has been blurting out words but is oriented to person place time and this is not main concern   Patient spouse denies the following fever, chills, blood in urine, abdominal pain  Protocols used: Urinary Catheter (e.g., Foley) Symptoms and Questions-A-AH Copied from CRM [id number]. Topic: Clinical - Red Word Triage >> May 21, 2024  9:37 AM Montie POUR wrote: Red Word that prompted transfer to Nurse Triage:  Paulette, spouse, is calling to speak with Dr. [name] or nurse about an appointment that was schedule today with Urologist about is foley catheter and it was canceled. He is having issues with his catheter; urine is cloudy and he is disoriented. She wants to know what to do.  She is at the Urologist office and wants a triage nurse to call her back at [phone] to discuss. I did offer to make a hospital follow up appointment but with catheter issues, I thought she needed to speak with a nurse.

## 2024-05-26 ENCOUNTER — Ambulatory Visit (HOSPITAL_COMMUNITY)

## 2024-05-27 ENCOUNTER — Encounter: Attending: Registered Nurse | Admitting: Registered Nurse

## 2024-05-29 ENCOUNTER — Telehealth (HOSPITAL_BASED_OUTPATIENT_CLINIC_OR_DEPARTMENT_OTHER): Payer: Self-pay

## 2024-05-29 NOTE — Telephone Encounter (Signed)
 Copied from CRM #[id number]. Topic: Clinical - Home Health Verbal Orders >> May 29, 2024  1:57 PM Montie POUR wrote: Caller/Agency: [facility] Callback Number: [phone] - This the main office number Service Requested: Physical Therapy Frequency: 1 time weekly for 8 weeks Any new concerns about the patient? No

## 2024-06-02 ENCOUNTER — Inpatient Hospital Stay

## 2024-06-02 ENCOUNTER — Inpatient Hospital Stay: Admitting: Oncology

## 2024-06-03 ENCOUNTER — Ambulatory Visit (HOSPITAL_COMMUNITY): Admitting: Vascular Surgery

## 2024-06-03 ENCOUNTER — Ambulatory Visit (HOSPITAL_COMMUNITY)

## 2024-06-03 ENCOUNTER — Telehealth (HOSPITAL_BASED_OUTPATIENT_CLINIC_OR_DEPARTMENT_OTHER): Payer: Self-pay | Admitting: *Deleted

## 2024-06-03 ENCOUNTER — Ambulatory Visit: Payer: Self-pay

## 2024-06-03 NOTE — Telephone Encounter (Signed)
 Spoke with pt's spouse letting her know that pt should go to the ED and she verbalized understanding.

## 2024-06-03 NOTE — Telephone Encounter (Signed)
 FYI Only or Action Required?: Action required by provider: clinical question for provider.  Patient was last seen in primary care on 03/12/2024 by [name], Quintin PARAS, MD.  Called Nurse Triage reporting Chest Pain.  Symptoms began a week ago.  Interventions attempted: Prescription medications: Nitroglycerin  and Other: Nothing for leg weakness.  Symptoms are: stable.  Triage Disposition: See PCP When Office is Open (Within 3 Days)  Patient/caregiver understands and will follow disposition?: No, wishes to speak with PCP  Reason for Disposition  [1] Chest pain lasting < 5 minutes AND [2] completely gone after taking nitroglycerin   Answer Assessment - Initial Assessment Questions Pt wife Paulette calling in today.Had to cancel cardio appt today due to leg weakness, unable to stand and was advised by cardiology to call PCP. Paulette stated the last couple of days she has not been able to get him to stand up, so she cannot even get him to an appointment. She feels like she's called the office and no one knows anything, and PCP doesn't do anything. She mentioned she is looking for the pt to be re-admitted to the hospital due to all the issues he's having, and upcoming appointments that she cannot get him to. advised her the pt will need to go to the ED to be admitted, she mentioned if that doesn't happen if she's supposed to just carry him home. She is requesting a call back from PCP today regarding this matter. Please advise [phone]  1. LOCATION: Where does it hurt?       Left side, top of chest  2. RADIATION: Does the pain go anywhere else? (e.g., into neck, jaw, arms, back)     Denies  3. ONSET: When did the chest pain begin? (Minutes, hours or days)      4am this morning  4. PATTERN: Does the pain come and go, or has it been constant since it started?  Does it get worse with exertion?      Comes and goes  5. DURATION: How long does it last (e.g., seconds, minutes,  hours)     30 minutes, subsided after taking Nitroglycerin   6. CARDIAC RISK FACTORS: Do you have any history of heart problems or risk factors for heart disease? (e.g., angina, prior heart attack; diabetes, high blood pressure, high cholesterol, smoker, or strong family history of heart disease)     Yes  7. OTHER SYMPTOMS: Do you have any other symptoms? (e.g., dizziness, nausea, vomiting, sweating, fever, difficulty breathing, cough)       Denies SOB, Pt reports having chest pain last week that was not persistent so did not take Nitroglycerin , states he does not get the chest pain too often. Pt is having trouble standing, reports this started a week ago. Right leg feels weak, denies numbness, tingling or pain, woke up one morning and it felt weak. Feels like leg is dragging sometimes. Pt had a stroke a month ago, affecting right side, but reports no issues with leg until last week.  Protocols used: Chest Pain-A-AH  Copied from CRM #[id number]. Topic: Clinical - Medical Advice >> Jun 03, 2024  9:35 AM Antony RAMAN wrote: Reason for CRM: patient was having chest pains lastnight and unable to walk today, calling to see if they need to go er

## 2024-06-03 NOTE — Telephone Encounter (Signed)
 Copied from CRM #[id number]. Topic: Clinical - Medical Advice >> Jun 03, 2024  9:35 AM Antony RAMAN wrote: Reason for CRM: patient was having chest pains lastnight and unable to walk today, calling to see if they need to go er

## 2024-06-03 NOTE — Telephone Encounter (Signed)
 Called and spoke to pt's spouse letting her know that pt needed to be evaluated at the ED and she verbalized  understanding. Nothing further needed.

## 2024-06-04 ENCOUNTER — Ambulatory Visit: Payer: Self-pay

## 2024-06-04 ENCOUNTER — Telehealth (HOSPITAL_BASED_OUTPATIENT_CLINIC_OR_DEPARTMENT_OTHER): Payer: Self-pay

## 2024-06-04 NOTE — Telephone Encounter (Signed)
 FYI Only or Action Required?: Action required by provider: update on patient condition and wife to call 911. Please place social work order to assist with transportation in the future.  Patient was last seen in primary care on 03/12/2024 by [name], Quintin PARAS, MD.  Called Nurse Triage reporting No chief complaint on file..  Symptoms began several days ago.  Interventions attempted: Prescription medications: Nitroglycerin   and Rest, hydration, or home remedies.  Symptoms are: gradually worsening.  Triage Disposition: Call EMS 911 Now  Patient/caregiver understands and will follow disposition?: Yes  Message from [place] R sent at 06/04/2024 10:54 AM EST  Reason for Triage: Patient is still experiencing chest pain and is unable to walk. Was supposed to receive a callback and no one called. Paulette is requesting to speak with NT.    Reason for Disposition  [1] SEVERE weakness (e.g., unable to walk or barely able to walk, requires support) AND [2] new-onset or getting worse  Answer Assessment - Initial Assessment Questions Wife calling to see if orders can be placed for social worker through [facility] to help arrange transport so patient can get to appointments. Missed appt yesterday due to CP and unable to walk.   Patient with stroke one month ago, residual R leg weakness but discharged able to stand. Over the last week patient is no longer able to stand on the at right leg. Wife having to lift him into chair. Intermittent slurred speech.   Called yesterday with Chest pains. Was advised by [facility] to ONLY go to the ED after 3 doses of Nitoglycerin. Pt has extensive vascular history and needing upcoming surgery.   Wife frustrated with system- everyone is telling her different things.   Advised EMS to transport to ED for worsened stroke symptoms and needing to evaluate for need for earlier surgical intervention. Wife understands and ok with calling herself.    1. SYMPTOM: What is  the main symptom you are concerned about? (e.g., weakness, numbness)     Worsened Right leg weakness 2. ONSET: When did this start? (e.g., minutes, hours, days; while sleeping)     Stroke last month  3. LAST NORMAL: When was the last time you (the patient) were normal (no symptoms)?     Last week  4. PATTERN Does this come and go, or has it been constant since it started?  Is it present now?     Constant  5. CARDIAC SYMPTOMS: Have you had any of the following symptoms: chest pain, difficulty breathing, palpitations?     Intermittent chest pains  6. NEUROLOGIC SYMPTOMS: Have you had any of the following symptoms: headache, dizziness, vision loss, double vision, changes in speech, unsteady on your feet?     Intermittent slurred speech  7. OTHER SYMPTOMS: Do you have any other symptoms?     denies  Protocols used: Neurologic Deficit-A-AH

## 2024-06-04 NOTE — Telephone Encounter (Signed)
 Copied from CRM [id number]. Topic: Clinical - Home Health Verbal Orders >> Jun 04, 2024  2:32 PM Travis F wrote: Caller/Agency: Nat, Home Health Nurse [facility]  Callback Number: [phone]  Service Requested: Occupational Therapy and Physical Therapy Frequency: 2x week for therapy , 1x week for occupational  Any new concerns about the patient? No

## 2024-06-04 NOTE — Telephone Encounter (Signed)
 Spoke with patient's spouse and recommended that she contacts insurance to find out what resources are available. I also informed patient that I would contact [facility] to find out status of transportation and PT.

## 2024-06-04 NOTE — Telephone Encounter (Signed)
 Spoke with Nat at [place] and provided verbal orders for PT and OT. Nat also confirmed that she will be assisting patient's spouse in setting up transportation.

## 2024-06-05 ENCOUNTER — Other Ambulatory Visit: Payer: Self-pay

## 2024-06-05 ENCOUNTER — Observation Stay (HOSPITAL_COMMUNITY)
Admission: EM | Admit: 2024-06-05 | Discharge: 2024-06-12 | Disposition: A | Discharge status: Skilled Nursing Facility | Attending: Family Medicine | Admitting: Family Medicine

## 2024-06-05 ENCOUNTER — Emergency Department (HOSPITAL_COMMUNITY)

## 2024-06-05 ENCOUNTER — Observation Stay (HOSPITAL_COMMUNITY)

## 2024-06-05 DIAGNOSIS — F32A Depression, unspecified: Secondary | ICD-10-CM | POA: Insufficient documentation

## 2024-06-05 DIAGNOSIS — D61818 Other pancytopenia: Secondary | ICD-10-CM | POA: Diagnosis present

## 2024-06-05 DIAGNOSIS — N179 Acute kidney failure, unspecified: Secondary | ICD-10-CM | POA: Diagnosis not present

## 2024-06-05 DIAGNOSIS — G14 Postpolio syndrome: Secondary | ICD-10-CM | POA: Insufficient documentation

## 2024-06-05 DIAGNOSIS — R531 Weakness: Principal | ICD-10-CM

## 2024-06-05 DIAGNOSIS — K7460 Unspecified cirrhosis of liver: Secondary | ICD-10-CM | POA: Diagnosis present

## 2024-06-05 DIAGNOSIS — Z959 Presence of cardiac and vascular implant and graft, unspecified: Secondary | ICD-10-CM | POA: Insufficient documentation

## 2024-06-05 DIAGNOSIS — I6523 Occlusion and stenosis of bilateral carotid arteries: Secondary | ICD-10-CM | POA: Diagnosis present

## 2024-06-05 DIAGNOSIS — I10 Essential (primary) hypertension: Secondary | ICD-10-CM | POA: Insufficient documentation

## 2024-06-05 DIAGNOSIS — E1169 Type 2 diabetes mellitus with other specified complication: Secondary | ICD-10-CM | POA: Diagnosis present

## 2024-06-05 DIAGNOSIS — Z8673 Personal history of transient ischemic attack (TIA), and cerebral infarction without residual deficits: Secondary | ICD-10-CM

## 2024-06-05 DIAGNOSIS — Z7982 Long term (current) use of aspirin: Secondary | ICD-10-CM | POA: Insufficient documentation

## 2024-06-05 DIAGNOSIS — E785 Hyperlipidemia, unspecified: Secondary | ICD-10-CM | POA: Insufficient documentation

## 2024-06-05 DIAGNOSIS — R7401 Elevation of levels of liver transaminase levels: Secondary | ICD-10-CM | POA: Insufficient documentation

## 2024-06-05 DIAGNOSIS — R7989 Other specified abnormal findings of blood chemistry: Secondary | ICD-10-CM | POA: Insufficient documentation

## 2024-06-05 DIAGNOSIS — Z79899 Other long term (current) drug therapy: Secondary | ICD-10-CM | POA: Insufficient documentation

## 2024-06-05 DIAGNOSIS — T83511A Infection and inflammatory reaction due to indwelling urethral catheter, initial encounter: Secondary | ICD-10-CM | POA: Diagnosis present

## 2024-06-05 DIAGNOSIS — N390 Urinary tract infection, site not specified: Secondary | ICD-10-CM | POA: Insufficient documentation

## 2024-06-05 DIAGNOSIS — E119 Type 2 diabetes mellitus without complications: Secondary | ICD-10-CM

## 2024-06-05 DIAGNOSIS — I2510 Atherosclerotic heart disease of native coronary artery without angina pectoris: Secondary | ICD-10-CM | POA: Diagnosis present

## 2024-06-05 LAB — URINALYSIS, W/ REFLEX TO CULTURE (INFECTION SUSPECTED)
Bilirubin Urine: NEGATIVE
Glucose, UA: NEGATIVE mg/dL
Ketones, ur: NEGATIVE mg/dL
Leukocytes,Ua: NEGATIVE
Nitrite: NEGATIVE
Protein, ur: 100 mg/dL — AB
Specific Gravity, Urine: 1.017 (ref 1.005–1.030)
pH: 5 (ref 5.0–8.0)

## 2024-06-05 LAB — DIFFERENTIAL
Abs Immature Granulocytes: 0.1 10*3/uL — ABNORMAL HIGH (ref 0.00–0.07)
Basophils Absolute: 0.1 10*3/uL (ref 0.0–0.1)
Basophils Relative: 2 %
Blasts: 5 %
Eosinophils Absolute: 0 10*3/uL (ref 0.0–0.5)
Eosinophils Relative: 0 %
Lymphocytes Relative: 3 %
Lymphs Abs: 0.1 10*3/uL — ABNORMAL LOW (ref 0.7–4.0)
Metamyelocytes Relative: 1 %
Monocytes Absolute: 0 10*3/uL — ABNORMAL LOW (ref 0.1–1.0)
Monocytes Relative: 0 %
Neutro Abs: 3.7 10*3/uL (ref 1.7–7.7)
Neutrophils Relative %: 88 %
Promyelocytes Relative: 1 %

## 2024-06-05 LAB — COMPREHENSIVE METABOLIC PANEL WITH GFR
ALT: 103 U/L — ABNORMAL HIGH (ref 0–44)
AST: 210 U/L — ABNORMAL HIGH (ref 15–41)
Albumin: 2.3 g/dL — ABNORMAL LOW (ref 3.5–5.0)
Alkaline Phosphatase: 232 U/L — ABNORMAL HIGH (ref 38–126)
Anion gap: 12 (ref 5–15)
BUN: 35 mg/dL — ABNORMAL HIGH (ref 8–23)
CO2: 18 mmol/L — ABNORMAL LOW (ref 22–32)
Calcium: 7.9 mg/dL — ABNORMAL LOW (ref 8.9–10.3)
Chloride: 101 mmol/L (ref 98–111)
Creatinine, Ser: 1.6 mg/dL — ABNORMAL HIGH (ref 0.61–1.24)
GFR, Estimated: 44 mL/min — ABNORMAL LOW
Glucose, Bld: 125 mg/dL — ABNORMAL HIGH (ref 70–99)
Potassium: 4.3 mmol/L (ref 3.5–5.1)
Sodium: 132 mmol/L — ABNORMAL LOW (ref 135–145)
Total Bilirubin: 2.1 mg/dL — ABNORMAL HIGH (ref 0.0–1.2)
Total Protein: 6.6 g/dL (ref 6.5–8.1)

## 2024-06-05 LAB — CBC
HCT: 23.4 % — ABNORMAL LOW (ref 39.0–52.0)
Hemoglobin: 7.5 g/dL — ABNORMAL LOW (ref 13.0–17.0)
MCH: 30.6 pg (ref 26.0–34.0)
MCHC: 32.1 g/dL (ref 30.0–36.0)
MCV: 95.5 fL (ref 80.0–100.0)
Platelets: 46 10*3/uL — ABNORMAL LOW (ref 150–400)
RBC: 2.45 MIL/uL — ABNORMAL LOW (ref 4.22–5.81)
RDW: 22.1 % — ABNORMAL HIGH (ref 11.5–15.5)
WBC: 4.2 10*3/uL (ref 4.0–10.5)
nRBC: 1 % — ABNORMAL HIGH (ref 0.0–0.2)

## 2024-06-05 LAB — MAGNESIUM: Magnesium: 2.2 mg/dL (ref 1.7–2.4)

## 2024-06-05 LAB — PROTIME-INR
INR: 1.3 — ABNORMAL HIGH (ref 0.8–1.2)
Prothrombin Time: 16.9 s — ABNORMAL HIGH (ref 11.4–15.2)

## 2024-06-05 LAB — AMMONIA: Ammonia: 21 umol/L (ref 9–35)

## 2024-06-05 LAB — TROPONIN T, HIGH SENSITIVITY
Troponin T High Sensitivity: 123 ng/L (ref 0–19)
Troponin T High Sensitivity: 95 ng/L — ABNORMAL HIGH (ref 0–19)

## 2024-06-05 LAB — CBG MONITORING, ED: Glucose-Capillary: 115 mg/dL — ABNORMAL HIGH (ref 70–99)

## 2024-06-05 MED ORDER — ONDANSETRON HCL 4 MG/2ML IJ SOLN: 4.0000 mg | Freq: Four times a day (QID) | INTRAMUSCULAR | Status: DC | PRN

## 2024-06-05 MED ORDER — SODIUM CHLORIDE 0.9 % IV SOLN
1.0000 g | Freq: Once | INTRAVENOUS | Status: AC
  Administered 2024-06-05: 1 g via INTRAVENOUS
  Filled 2024-06-05: qty 10

## 2024-06-05 MED ORDER — ASPIRIN 81 MG PO CHEW
81.0000 mg | CHEWABLE_TABLET | Freq: Every day | ORAL | Status: DC
  Administered 2024-06-06 – 2024-06-12 (×7): 81 mg via ORAL
  Filled 2024-06-05 (×7): qty 1

## 2024-06-05 MED ORDER — INSULIN ASPART 100 UNIT/ML IJ SOLN
0.0000 [IU] | Freq: Three times a day (TID) | INTRAMUSCULAR | Status: DC
  Administered 2024-06-07: 1 [IU] via SUBCUTANEOUS
  Administered 2024-06-09: 2 [IU] via SUBCUTANEOUS
  Administered 2024-06-10 – 2024-06-11 (×3): 1 [IU] via SUBCUTANEOUS
  Filled 2024-06-05 (×3): qty 1
  Filled 2024-06-05: qty 2
  Filled 2024-06-05: qty 1

## 2024-06-05 MED ORDER — CLOPIDOGREL BISULFATE 75 MG PO TABS
75.0000 mg | ORAL_TABLET | Freq: Every day | ORAL | Status: DC
  Administered 2024-06-06 – 2024-06-12 (×7): 75 mg via ORAL
  Filled 2024-06-05 (×7): qty 1

## 2024-06-05 MED ORDER — SODIUM CHLORIDE 0.9% FLUSH
3.0000 mL | Freq: Two times a day (BID) | INTRAVENOUS | Status: DC
  Administered 2024-06-06 – 2024-06-11 (×12): 3 mL via INTRAVENOUS

## 2024-06-05 MED ORDER — SODIUM CHLORIDE 0.9 % IV SOLN
1.0000 g | INTRAVENOUS | Status: DC
  Administered 2024-06-06 – 2024-06-09 (×4): 1 g via INTRAVENOUS
  Filled 2024-06-05 (×4): qty 10

## 2024-06-05 MED ORDER — BISACODYL 5 MG PO TBEC: 5.0000 mg | DELAYED_RELEASE_TABLET | Freq: Every day | ORAL | Status: DC | PRN

## 2024-06-05 MED ORDER — LACTATED RINGERS IV SOLN: INTRAVENOUS | Status: AC

## 2024-06-05 MED ORDER — SODIUM CHLORIDE 0.9 % IV BOLUS
500.0000 mL | Freq: Once | INTRAVENOUS | Status: AC
  Administered 2024-06-05: 500 mL via INTRAVENOUS

## 2024-06-05 MED ORDER — SENNOSIDES-DOCUSATE SODIUM 8.6-50 MG PO TABS: 1.0000 | ORAL_TABLET | Freq: Every evening | ORAL | Status: DC | PRN

## 2024-06-05 MED ORDER — ONDANSETRON HCL 4 MG PO TABS: 4.0000 mg | ORAL_TABLET | Freq: Four times a day (QID) | ORAL | Status: DC | PRN

## 2024-06-05 MED ORDER — MELATONIN 5 MG PO TABS
5.0000 mg | ORAL_TABLET | Freq: Every evening | ORAL | Status: DC | PRN
  Administered 2024-06-06 – 2024-06-11 (×4): 5 mg via ORAL
  Filled 2024-06-05 (×4): qty 1

## 2024-06-05 MED ORDER — FLUOXETINE HCL 20 MG PO CAPS
20.0000 mg | ORAL_CAPSULE | Freq: Every day | ORAL | Status: DC
  Administered 2024-06-06 – 2024-06-12 (×7): 20 mg via ORAL
  Filled 2024-06-05 (×7): qty 1

## 2024-06-05 NOTE — ED Triage Notes (Signed)
 Pt BIB EMS - dx with UTI 2w ago. Started antibiotics but had only been taking half dose. Antibiotics finished with symptoms continuing - dark foul smelling urine

## 2024-06-05 NOTE — Hospital Course (Signed)
 Christopher Roberts is a 79 y.o. 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

## 2024-06-05 NOTE — ED Notes (Signed)
 Critical lab - MD Randol notified

## 2024-06-05 NOTE — ED Provider Notes (Signed)
 " [place] EMERGENCY DEPARTMENT AT [facility] Provider Note   CSN: [id number] Arrival date & time: 06/05/24  1400     Patient presents with: Urinary Tract Infection   Christopher Roberts is a 79 y.o. male.    Urinary Tract Infection  Patient has history of hyperlipidemia hypertension coronary artery disease, prior stroke, diabetes, ST elevation MI, cirrhosis  Patient states he is here because he has had a stroke recently.  Patient states has been having trouble moving his right leg.  Patient states he was in the hospital for this a few weeks ago.  Patient cannot tell me specifically why he is here in the ED right now.  Patient denies any headache.  He is not any chest pain.  No abdominal pain.  No fevers or chills.  Admits to not having a good appetite recently  Prior to Admission medications  Medication Sig Start Date End Date Taking? Authorizing Provider  acetaminophen  (TYLENOL ) 325 MG tablet Take 2 tablets (650 mg total) by mouth every 4 (four) hours as needed for mild pain (pain score 1-3) or fever (or temp > 37.5 C (99.5 F)). 04/30/24   Angiulli, Toribio PARAS, PA-C  aspirin  81 MG tablet Take 81 mg by mouth daily.    [provider]  atorvastatin  (LIPITOR ) 80 MG tablet Take 1 tablet (80 mg total) by mouth daily at 6 PM. 05/06/24   Angiulli, Toribio PARAS, PA-C  clopidogrel  (PLAVIX ) 75 MG tablet Take 1 tablet (75 mg total) by mouth daily. 05/06/24   Angiulli, Toribio PARAS, PA-C  docusate sodium  (COLACE) 100 MG capsule Take 1 capsule (100 mg total) by mouth daily. 05/01/24   Angiulli, Toribio PARAS, PA-C  FLUoxetine  (PROZAC ) 20 MG capsule Take 1 capsule (20 mg total) by mouth daily. 05/06/24   Angiulli, Toribio PARAS, PA-C  meclizine  (ANTIVERT ) 25 MG tablet Take 1 tablet (25 mg total) by mouth 3 (three) times daily as needed. 05/06/24   Angiulli, Toribio PARAS, PA-C  melatonin 5 MG TABS Take 1 tablet (5 mg total) by mouth at bedtime as needed. 05/12/24   Angiulli, Toribio PARAS, PA-C  metFORMIN   (GLUCOPHAGE -XR) 500 MG 24 hr tablet Take 1 tablet (500 mg total) by mouth daily. 05/06/24   Angiulli, Toribio PARAS, PA-C  midodrine  (PROAMATINE ) 5 MG tablet Take 1 tablet (5 mg total) by mouth 3 (three) times daily with meals. 05/12/24   Angiulli, Toribio PARAS, PA-C  nitroGLYCERIN  (NITROSTAT ) 0.4 MG SL tablet Place 1 tablet (0.4 mg total) under the tongue every 5 (five) minutes as needed for chest pain. 05/06/24 03/12/30  Angiulli, Toribio PARAS, PA-C  polyethylene glycol (MIRALAX  / GLYCOLAX ) 17 g packet Take 17 g by mouth daily as needed for mild constipation. 04/30/24   Angiulli, Toribio PARAS, PA-C  traZODone  (DESYREL ) 50 MG tablet Take 1 tablet (50 mg total) by mouth at bedtime as needed. 05/06/24   Angiulli, Toribio PARAS, PA-C    Allergies: Patient has no known allergies.    Review of Systems  Updated Vital Signs BP 131/71 (BP Location: Right Arm)   Pulse 80   Temp 97.8 F (36.6 C) (Oral)   Resp 16   SpO2 97%   Physical Exam Vitals and nursing note reviewed.  Constitutional:      Appearance: He is well-developed. He is ill-appearing.  HENT:     Head: Normocephalic and atraumatic.     Right Ear: External ear normal.     Left Ear: External ear normal.  Mouth/Throat:     Mouth: Mucous membranes are dry.  Eyes:     General: No scleral icterus.       Right eye: No discharge.        Left eye: No discharge.     Conjunctiva/sclera: Conjunctivae normal.  Neck:     Trachea: No tracheal deviation.  Cardiovascular:     Rate and Rhythm: Normal rate and regular rhythm.  Pulmonary:     Effort: Pulmonary effort is normal. No respiratory distress.     Breath sounds: Normal breath sounds. No stridor. No wheezing or rales.  Abdominal:     General: Bowel sounds are normal. There is no distension.     Palpations: Abdomen is soft.     Tenderness: There is no abdominal tenderness. There is no guarding or rebound.  Musculoskeletal:        General: No tenderness or deformity.     Cervical back: Neck supple.      Right lower leg: No edema.     Left lower leg: No edema.  Skin:    General: Skin is warm and dry.     Findings: No rash.  Neurological:     General: No focal deficit present.     Mental Status: He is alert.     Cranial Nerves: No cranial nerve deficit, dysarthria or facial asymmetry.     Sensory: No sensory deficit.     Motor: Weakness present. No abnormal muscle tone or seizure activity.     Coordination: Coordination normal.     Comments: Generalized, able to move all extremities, no facial droop noted  Psychiatric:        Mood and Affect: Mood normal.     (all labs ordered are listed, but only abnormal results are displayed) Labs Reviewed - No data to display  EKG: None  Radiology: No results found.   Procedures   Medications Ordered in the ED - No data to display  Clinical Course as of 06/06/24 1035  Thu Jun 05, 2024  1634 Troponin T, High Sensitivity(!!) Opponent elevated at 123 [JK]  1634 Urinalysis, w/ Reflex to Culture (Infection Suspected) -Urine, Clean Catch(!) Urine does suggest UTI [JK]  1635 Comprehensive metabolic panel(!) Creatinine increased compared to last [JK]  1635 Comprehensive metabolic panel(!) Lft  bilirubin elevated [JK]  1756 Troponin T, High Sensitivity(!) Trop elevated but stable [JK]  1756 Urinalysis, w/ Reflex to Culture (Infection Suspected) -Urine, Clean Catch(!) Suggestive of uti [JK]  1834 Case discussed Dr. Tobie regarding admission [JK]    Clinical Course User Index [JK] Randol Simmonds, MD                                 Medical Decision Making Problems Addressed: AKI (acute kidney injury): acute illness or injury that poses a threat to life or bodily functions Lower urinary tract infectious disease: acute illness or injury that poses a threat to life or bodily functions Weakness: acute illness or injury that poses a threat to life or bodily functions  Amount and/or Complexity of Data Reviewed Labs: ordered. Decision-making  details documented in ED Course. Radiology: ordered and independent interpretation performed.  Risk Decision regarding hospitalization.   Pt presented to the ED with weakness possible uti.    Afebrile no signs of systemic infection.  UA suggests possible uti although pt does have indwelling catheter.  Will treat, culture.  Trop elevated but not increasing.  No  chest pain.  Doubt ACS.  AKI dehydration noted.Pt appears dehydrated.  IV fluids ordered.  ADmit for further treatment.     Final diagnoses:  Weakness  AKI (acute kidney injury)  Lower urinary tract infectious disease    ED Discharge Orders     None          Randol Simmonds, MD 06/06/24 1038  "

## 2024-06-05 NOTE — H&P (Signed)
 " History and Physical    Christopher Roberts FMW:[id number] DOB: 1945-11-21 DOA: 06/05/2024  PCP: [name], [name], MD  Patient coming from: Home  I have personally briefly reviewed patient's old medical records in [facility]  Chief Complaint: Generalized weakness  HPI: Christopher Roberts is a 79 y.o. 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  Patient recently admitted 04/15/2024-04/24/24 with acute CVA.  Initially presented with right-sided weakness.  MRI showed left small ACA and right punctate MCA/ACA infarcts felt secondary to large vessel disease from bilateral ICA stenosis.  He underwent TCAR on the left on 04/21/2024.  He was placed on DAPT with aspirin  and Plavix .  He was discharged to [facility] and subsequently discharged to home on 05/14/2024.  Patient states that he has been ambulating with use of a walker.  He says that he did have some improvement in his right leg strength since leaving rehab.  This morning he noticed that the right leg was weak again.  He did not have any weakness in his arms.  He has had a Foley catheter in place since his hospitalization for urinary retention.  He says urine has been dark and foul smelling.  He denies fevers, chills, diaphoresis, chest pain, dyspnea, cough, abdominal pain, diarrhea or constipation.  He has not seen any obvious bleeding.  ED Course  Labs/Imaging on admission: I have personally reviewed following labs and imaging studies.  Initial vitals showed BP 131/71, pulse 79, RR 16, temp 97.8 F, SpO2 97% on room air.  Labs showed troponin T 123 > 95, ammonia 21, magnesium  2.2, WBC 4.2, hemoglobin 7.5, platelets 46, sodium 132, potassium 4.3, bicarb 18, BUN 35, creatinine 1.60, serum glucose 125, AST 210, ALT 103, alk phos  232, total bilirubin 2.1.  UA showed negative nitrates, negative leukocytes, 0-5 RBCs, 11-20 WBCs, many bacteria.  Urine culture in process.  CT head without contrast negative for acute intracranial abnormality.  Moderate chronic microvascular ischemic changes with multiple areas of encephalomalacia compatible with remote infarcts noted.  Patient was given 500 cc normal saline bolus and IV ceftriaxone  1 g.  The hospitalist service was consulted for admission.  Review of Systems: All systems reviewed and are negative except as documented in history of present illness above.   Past Medical History:  Diagnosis Date   CAD (coronary artery disease)    a. s/p MI in 2005 (symptom of indigestion); b. CABG x 3 in 3/09: L-LAD, S-OM, EF unknown   Colon polyps    Diabetes mellitus    DJD (degenerative joint disease)    HLD (hyperlipidemia)    HTN (hypertension)    MI (myocardial infarction) (HCC) 2005   manifested by indigestion   Murmur    echo 6/12:  Upper septal thickening, no LVOT gradient,, EF 65%, mild LAE      Past Surgical History:  Procedure Laterality Date   CHOLECYSTECTOMY  2002   CORONARY ARTERY BYPASS GRAFT  2009   x3   CORONARY/GRAFT ACUTE MI REVASCULARIZATION N/A 07/25/2020   Procedure: Coronary/Graft Acute MI Revascularization;  Surgeon: Jordan, Peter M, MD;  Location: [facility] INVASIVE CV LAB;  Service: Cardiovascular;  Laterality: N/A;   LEFT HEART CATH AND CORS/GRAFTS ANGIOGRAPHY N/A 07/25/2020   Procedure: LEFT HEART CATH AND CORS/GRAFTS ANGIOGRAPHY;  Surgeon: Jordan, Peter M,  MD;  Location: [facility] INVASIVE CV LAB;  Service: Cardiovascular;  Laterality: N/A;   TONSILLECTOMY     TRANSCAROTID ARTERY REVASCULARIZATION  Left 04/21/2024   Procedure: LEFT TRANSCAROTID ARTERY REVASCULARIZATION (TCAR);  Surgeon: [name], MD;  Location: [facility] OR;  Service: Vascular;  Laterality: Left;    Social History: Social History[1]  Allergies[2]  Family History  Problem Relation  Age of Onset   Colon cancer Mother    Heart attack Mother    High blood pressure Father    Diabetes Father      Prior to Admission medications  Medication Sig Start Date End Date Taking? Authorizing Provider  acetaminophen  (TYLENOL ) 325 MG tablet Take 2 tablets (650 mg total) by mouth every 4 (four) hours as needed for mild pain (pain score 1-3) or fever (or temp > 37.5 C (99.5 F)). 04/30/24   Angiulli, Toribio PARAS, PA-C  aspirin  81 MG tablet Take 81 mg by mouth daily.    [provider]  atorvastatin  (LIPITOR ) 80 MG tablet Take 1 tablet (80 mg total) by mouth daily at 6 PM. 05/06/24   Angiulli, Toribio PARAS, PA-C  clopidogrel  (PLAVIX ) 75 MG tablet Take 1 tablet (75 mg total) by mouth daily. 05/06/24   Angiulli, Toribio PARAS, PA-C  docusate sodium  (COLACE) 100 MG capsule Take 1 capsule (100 mg total) by mouth daily. 05/01/24   Angiulli, Toribio PARAS, PA-C  FLUoxetine  (PROZAC ) 20 MG capsule Take 1 capsule (20 mg total) by mouth daily. 05/06/24   Angiulli, Toribio PARAS, PA-C  meclizine  (ANTIVERT ) 25 MG tablet Take 1 tablet (25 mg total) by mouth 3 (three) times daily as needed. 05/06/24   Angiulli, Toribio PARAS, PA-C  melatonin 5 MG TABS Take 1 tablet (5 mg total) by mouth at bedtime as needed. 05/12/24   Angiulli, Toribio PARAS, PA-C  metFORMIN  (GLUCOPHAGE -XR) 500 MG 24 hr tablet Take 1 tablet (500 mg total) by mouth daily. 05/06/24   Angiulli, Toribio PARAS, PA-C  midodrine  (PROAMATINE ) 5 MG tablet Take 1 tablet (5 mg total) by mouth 3 (three) times daily with meals. 05/12/24   Angiulli, Toribio PARAS, PA-C  nitroGLYCERIN  (NITROSTAT ) 0.4 MG SL tablet Place 1 tablet (0.4 mg total) under the tongue every 5 (five) minutes as needed for chest pain. 05/06/24 03/12/30  Angiulli, Toribio PARAS, PA-C  polyethylene glycol (MIRALAX  / GLYCOLAX ) 17 g packet Take 17 g by mouth daily as needed for mild constipation. 04/30/24   Angiulli, Toribio PARAS, PA-C  traZODone  (DESYREL ) 50 MG tablet Take 1 tablet (50 mg total) by mouth at bedtime as needed.  05/06/24   Pegge Toribio PARAS, PA-C    Physical Exam: Vitals:   06/05/24 1416 06/05/24 1719 06/05/24 1828 06/05/24 1900  BP: 131/71 130/72  112/67  Pulse: 80 83  82  Resp: 16 20  13   Temp: 97.8 F (36.6 C)  97.8 F (36.6 C)   TempSrc: Oral  Oral   SpO2: 97% 95%  94%   Constitutional: Resting in bed, NAD, calm, comfortable Eyes: EOMI, lids and conjunctivae normal ENMT: Mucous membranes are dry. Posterior pharynx clear of any exudate or lesions.Normal dentition.  Neck: normal, supple, no masses. Respiratory: clear to auscultation bilaterally, no wheezing, no crackles. Normal respiratory effort. No accessory muscle use.  Cardiovascular: Regular rate and rhythm, no murmurs / rubs / gallops. No extremity edema. 2+ pedal pulses. Abdomen: Soft, no tenderness, no masses palpated. GU: Foley catheter in place, scant orange appearing urine in collecting bag Musculoskeletal: no clubbing / cyanosis.  No joint deformity upper and lower extremities. Good ROM, no contractures. Normal muscle tone.  Skin: no rashes, lesions, ulcers. No induration Neurologic: Sensation intact. Strength 4/5 RLE otherwise 5/5 other extremities. Psychiatric: Normal judgment and insight. Alert and oriented x 4. Normal mood.   EKG: Personally reviewed. Sinus rhythm, rate 90, frequent PVCs.  PVCs new when compared to previous.  Assessment/Plan Principal Problem:   AKI (acute kidney injury) Active Problems:   Hyperlipidemia associated with type 2 diabetes mellitus (HCC)   Coronary artery disease due to lipid rich plaque   Type 2 diabetes mellitus (HCC)   Cirrhosis of liver (HCC)   Other pancytopenia (HCC)   Bilateral carotid artery stenosis   History of CVA (cerebrovascular accident)   Infection associated with indwelling urinary catheter, initial encounter   Christopher Roberts is a 79 y.o. 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  Assessment and Plan: Acute kidney injury: Creatinine 1.60 on admission compared to baseline 1.1-1.2.  He appears volume depleted.  He has had Foley catheter in place since recent admission for urinary retention. - Continue IV fluid hydration overnight - Holding metformin  - Monitor UOP  Indwelling Foley catheter with associated urinary tract infection, POA: UA with many bacteria.  Per report, he was recently diagnosed with UTI but only took half dose of his antibiotics.  Has ongoing dark and foul-smelling urine. - Continue IV ceftriaxone  - Follow urine culture - Continue Foley care  Cirrhosis Elevated LFTs: Patient with hepatic cirrhosis without evidence of decompensation.  LFTs mildly elevated.  Abdomen is soft, nontender.  RUQ U/S shows surgically absent gallbladder without bile duct dilatation.  Rise in LFTs may be related to statin. - Hold atorvastatin  - Repeat CMP in a.m.  CAD s/p CABG 2009 and PCI 2022 Elevated troponin: Troponin mildly elevated and downtrending on repeat.  Patient denies any chest pain.  EKG without significant ischemic changes.  Suspect demand ischemia. 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  He is scheduled for bone marrow biopsy on 06/19/2024.  WBC has normalized however has persistent and stable anemia and thrombocytopenia without evidence of bleeding.  Continue to monitor and consider PRBC transfusion if hemoglobin continues to drop.  Generalized weakness: Does not appear to have acute focal weakness.  PT/OT eval.  Type 2 diabetes: Holding metformin .  Placed on sensitive SSI.  History of CVA Bilateral carotid artery stenosis s/p left TCAR 04/21/2024 Hyperlipidemia: Continue aspirin , Plavix .  Holding  atorvastatin  as above.  Depression: Continue Prozac .   DVT prophylaxis: SCDs Start: 06/05/24 1943 Code Status: Full code, confirmed with patient on admission Family Communication: Discussed with patient, he has discussed with family Disposition Plan: From home, dispo pending clinical progress Consults called: None Severity of Illness: The appropriate patient status for this patient is OBSERVATION. Observation status is judged to be reasonable and necessary in order to provide the required intensity of service to ensure the patient's safety. The patient's presenting symptoms, physical exam findings, and initial radiographic and laboratory data in the context of their medical condition is felt to place them at decreased risk for further clinical deterioration. Furthermore, it is anticipated that the patient will be medically stable for discharge from the hospital within 2 midnights of admission.  Jorie Blanch MD [facility]  If 7PM-7AM, please contact night-coverage [email or web address]  06/05/2024, 9:50 PM      [1]  Social History Tobacco Use   Smoking status: Former    Current packs/day: 0.20    Average packs/day: 0.2 packs/day for 40.0 years (8.0 ttl pk-yrs)    Types: Cigarettes    Passive exposure: Current   Smokeless tobacco: Never   Tobacco comments:    quit march 2010 after 20 yrs  Vaping Use   Vaping status: Never Used  Substance Use Topics   Alcohol use: No   Drug use: No  [2] No Known Allergies  "

## 2024-06-06 DIAGNOSIS — N179 Acute kidney failure, unspecified: Secondary | ICD-10-CM | POA: Diagnosis not present

## 2024-06-06 LAB — GLUCOSE, CAPILLARY
Glucose-Capillary: 117 mg/dL — ABNORMAL HIGH (ref 70–99)
Glucose-Capillary: 142 mg/dL — ABNORMAL HIGH (ref 70–99)

## 2024-06-06 LAB — CBC
HCT: 20.4 % — ABNORMAL LOW (ref 39.0–52.0)
Hemoglobin: 6.9 g/dL — CL (ref 13.0–17.0)
MCH: 31.4 pg (ref 26.0–34.0)
MCHC: 33.8 g/dL (ref 30.0–36.0)
MCV: 92.7 fL (ref 80.0–100.0)
Platelets: 42 10*3/uL — ABNORMAL LOW (ref 150–400)
RBC: 2.2 MIL/uL — ABNORMAL LOW (ref 4.22–5.81)
RDW: 21.8 % — ABNORMAL HIGH (ref 11.5–15.5)
WBC: 3.8 10*3/uL — ABNORMAL LOW (ref 4.0–10.5)
nRBC: 0.8 % — ABNORMAL HIGH (ref 0.0–0.2)

## 2024-06-06 LAB — COMPREHENSIVE METABOLIC PANEL WITH GFR
ALT: 92 U/L — ABNORMAL HIGH (ref 0–44)
AST: 172 U/L — ABNORMAL HIGH (ref 15–41)
Albumin: 2.2 g/dL — ABNORMAL LOW (ref 3.5–5.0)
Alkaline Phosphatase: 213 U/L — ABNORMAL HIGH (ref 38–126)
Anion gap: 9 (ref 5–15)
BUN: 36 mg/dL — ABNORMAL HIGH (ref 8–23)
CO2: 20 mmol/L — ABNORMAL LOW (ref 22–32)
Calcium: 7.8 mg/dL — ABNORMAL LOW (ref 8.9–10.3)
Chloride: 104 mmol/L (ref 98–111)
Creatinine, Ser: 1.52 mg/dL — ABNORMAL HIGH (ref 0.61–1.24)
GFR, Estimated: 46 mL/min — ABNORMAL LOW
Glucose, Bld: 118 mg/dL — ABNORMAL HIGH (ref 70–99)
Potassium: 3.9 mmol/L (ref 3.5–5.1)
Sodium: 133 mmol/L — ABNORMAL LOW (ref 135–145)
Total Bilirubin: 1.8 mg/dL — ABNORMAL HIGH (ref 0.0–1.2)
Total Protein: 6 g/dL — ABNORMAL LOW (ref 6.5–8.1)

## 2024-06-06 LAB — CBG MONITORING, ED
Glucose-Capillary: 106 mg/dL — ABNORMAL HIGH (ref 70–99)
Glucose-Capillary: 112 mg/dL — ABNORMAL HIGH (ref 70–99)

## 2024-06-06 LAB — PREPARE RBC (CROSSMATCH)

## 2024-06-06 LAB — HEMOGLOBIN AND HEMATOCRIT, BLOOD
HCT: 24.6 % — ABNORMAL LOW (ref 39.0–52.0)
Hemoglobin: 8.2 g/dL — ABNORMAL LOW (ref 13.0–17.0)

## 2024-06-06 MED ORDER — CHLORHEXIDINE GLUCONATE CLOTH 2 % EX PADS
6.0000 | MEDICATED_PAD | Freq: Every day | CUTANEOUS | Status: DC
  Administered 2024-06-06 – 2024-06-12 (×7): 6 via TOPICAL

## 2024-06-06 MED ORDER — SODIUM CHLORIDE 0.9% IV SOLUTION: Freq: Once | INTRAVENOUS | Status: AC

## 2024-06-06 NOTE — Evaluation (Signed)
 Physical Therapy Evaluation Patient Details Name: Christopher Roberts MRN: [id number] DOB: 12/13/1945 Today's Date: 06/06/2024  History of Present Illness  Pt is a 79 y.o. male admitted with AKI and generalized weakness. 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  Clinical Impression  Pt admitted with above diagnosis. Per chart review, pt recently d/c home with spouse from [facility], pt reports using RW and has hospital bed on main level of home (oriented to self and location only and no family present on eval). Pt needing mod-max A intermittent +2 A for bed mobility, able to follow commands for hand placement and LE placement to assist with rolling and mobility. Once seated at bedside, pt needing intermittent propping on UE for clothing doffing and hospital gown donning. Pt noted to have generalized weakness, RLE slower to initiate mobility and weaker than LLE. Pt pleasant, follows commands, noted to be soiled in bowel movement requiring total A for pericare. Pt noted to have poor activity tolerance, declines standing due to fatigued from bed mobility/linen change. Patient will benefit from continued inpatient follow up therapy, <3 hours/day. Pt currently with functional limitations due to the deficits listed below (see PT Problem List). Pt will benefit from acute skilled PT to increase their independence and safety with mobility to allow discharge.           If plan is discharge home, recommend the following: A lot of help with walking and/or transfers;A lot of help with bathing/dressing/bathroom;Assistance with cooking/housework;Assist for transportation;Help with stairs or ramp for entrance   Can travel by private vehicle   No    Equipment Recommendations None recommended by PT (defer to next venue)  Recommendations for Other Services       Functional Status  Assessment Patient has had a recent decline in their functional status and demonstrates the ability to make significant improvements in function in a reasonable and predictable amount of time.     Precautions / Restrictions Precautions Precautions: Fall Recall of Precautions/Restrictions: Impaired Precaution/Restrictions Comments: residual R-sided deficits from CVA Dec 2025 (weakness, inattention), indwelling Foley, incontinent bowel, orthostatic Restrictions Weight Bearing Restrictions Per Provider Order: No      Mobility  Bed Mobility Overal bed mobility: Needs Assistance Bed Mobility: Rolling, Supine to Sit, Sit to Supine Rolling: Mod assist, +2 for physical assistance, +2 for safety/equipment   Supine to sit: Max assist Sit to supine: Mod assist, +2 for physical assistance, +2 for safety/equipment   General bed mobility comments: MAX A to transition from supine to sitting with +1, pt dizzy immediately upon transitioning upright and spontaneously returns to supine. sits end of session to doff/don shirt and gown    Transfers                   General transfer comment: NT due to pt soiled and need for pericare + linen change    Ambulation/Gait                  Stairs            Wheelchair Mobility     Tilt Bed    Modified Rankin (Stroke Patients Only)       Balance Overall balance assessment: Needs assistance Sitting-balance support: Bilateral upper extremity supported, Feet supported Sitting balance-Leahy Scale: Fair Sitting balance - Comments: intermittent propping on UE  Pertinent Vitals/Pain Pain Assessment Pain Assessment: No/denies pain    Home Living Family/patient expects to be discharged to:: Private residence Living Arrangements: Spouse/significant other;Other relatives Available Help at Discharge: Family;Available 24 hours/day Type of Home: House Home Access: Level entry      Alternate Level Stairs-Number of Steps: 12 Home Layout: Two level;Bed/bath upstairs;1/2 bath on main level;Other (Comment) (pt reports he has been sleeping downstairs since rehab discharge) Home Equipment: Rolling Walker (2 wheels);Cane - single point;Rollator (4 wheels);Shower seat - built in;Hand held shower head Additional Comments: information obtained from chart review as pt oriented to self and location    Prior Function Prior Level of Function : Needs assist;Patient poor historian/Family not available (information obtained from chart review as pt oriented to self and location)             Mobility Comments: uses RW ADLs Comments: requires assist from spouse. unsure of accuracy as pt is a poor histoian. pt states he lives downstairs in a hospital bed.     Extremity/Trunk Assessment   Upper Extremity Assessment Upper Extremity Assessment: Defer to OT evaluation RUE Deficits / Details: residual R-sided deficits from prior CVA, R inattention, tends to keep RUE in flexor synergy at rest with ROM WFL RUE Coordination: decreased fine motor;decreased gross motor    Lower Extremity Assessment Lower Extremity Assessment: Generalized weakness    Cervical / Trunk Assessment Cervical / Trunk Assessment: Normal  Communication   Communication Communication: No apparent difficulties    Cognition Arousal: Alert Behavior During Therapy: Flat affect   PT - Cognitive impairments: No family/caregiver present to determine baseline                       PT - Cognition Comments: pt pleasant, states name appropriately, reports age is 53, unsure of date, aware of being at hospital, follows commands with cues Following commands: Intact       Cueing Cueing Techniques: Verbal cues, Gestural cues     General Comments General comments (skin integrity, edema, etc.): Additional time spent during session for thorough pericare, bed linen change, gown change.    Exercises      Assessment/Plan    PT Assessment Patient needs continued PT services  PT Problem List Decreased strength;Decreased activity tolerance;Decreased balance       PT Treatment Interventions DME instruction;Gait training;Stair training;Functional mobility training;Therapeutic activities;Therapeutic exercise;Balance training;Neuromuscular re-education;Cognitive remediation;Patient/family education;Wheelchair mobility training    PT Goals (Current goals can be found in the Care Plan section)  Acute Rehab PT Goals Patient Stated Goal: can you get me a warm blanket? PT Goal Formulation: With patient Time For Goal Achievement: 06/20/24 Potential to Achieve Goals: Fair    Frequency Min 2X/week     Co-evaluation PT/OT/SLP Co-Evaluation/Treatment: Yes Reason for Co-Treatment: Complexity of the patient's impairments (multi-system involvement) PT goals addressed during session: Mobility/safety with mobility OT goals addressed during session: ADL's and self-care       AM-PAC PT 6 Clicks Mobility  Outcome Measure Help needed turning from your back to your side while in a flat bed without using bedrails?: A Lot Help needed moving from lying on your back to sitting on the side of a flat bed without using bedrails?: A Lot Help needed moving to and from a bed to a chair (including a wheelchair)?: Total Help needed standing up from a chair using your arms (e.g., wheelchair or bedside chair)?: Total Help needed to walk in hospital room?: Total Help needed climbing 3-5 steps  with a railing? : Total 6 Click Score: 8    End of Session   Activity Tolerance: Patient tolerated treatment well Patient left: in bed;with call bell/phone within reach;with bed alarm set Nurse Communication: Mobility status PT Visit Diagnosis: Muscle weakness (generalized) (M62.81);Other abnormalities of gait and mobility (R26.89)    Time: 8549-8481 PT Time Calculation (min) (ACUTE ONLY): 28 min   Charges:   PT  Evaluation $PT Eval Moderate Complexity: 1 Mod   PT General Charges $$ ACUTE PT VISIT: 1 Visit         Tori Camilah Spillman PT, DPT 06/06/24, 4:23 PM

## 2024-06-06 NOTE — Care Management Obs Status (Signed)
 MEDICARE OBSERVATION STATUS NOTIFICATION   Patient Details  Name: Christopher Roberts MRN: [id number] Date of Birth: 11-Jun-1945   Medicare Observation Status Notification Given:  Yes    Camelia JONETTA Cary, RN 06/06/2024, 2:54 PM

## 2024-06-06 NOTE — ED Notes (Signed)
Pt transferred into hospital bed.

## 2024-06-06 NOTE — Progress Notes (Signed)
" ° ° ° °  Patient Name: Christopher Roberts           DOB: 1945/07/28  MRN: [id number]      Admission Date: 06/05/2024  Attending Provider: Tobie Jorie SAUNDERS, MD  Primary Diagnosis: AKI (acute kidney injury)   Level of care: Telemetry   OVERNIGHT EVENT  Nursing staff reported hemoglobin 6.9.  No overt bleeding reported.  Prior hemoglobin 7.5. 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 Scheduled bone marrow biopsy on 06/19/2024.  Plan:  Order placed for 1 unit PRBC.  Frederich Montilla, DNP, ACNPC- AG [facility]     "

## 2024-06-06 NOTE — Progress Notes (Signed)
 " PROGRESS NOTE    Christopher Roberts  FMW:[id number] DOB: Oct 12, 1945 DOA: 06/05/2024 PCP: [name], [name], MD   Brief Narrative:  79 y.o. 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   Patient recently admitted 04/15/2024-04/24/24 with acute CVA.  Initially presented with right-sided weakness.  MRI showed left small ACA and right punctate MCA/ACA infarcts felt secondary to large vessel disease from bilateral ICA stenosis.  He underwent TCAR on the left on 04/21/2024.  He was placed on DAPT with aspirin  and Plavix .  He was discharged to [facility] and subsequently discharged to home on 05/14/2024.   has been ambulating with use of a walker. He says that he did have some improvement in his right leg strength since leaving rehab. This morning he noticed that the right leg was weak again. He did not have any weakness in his arms. He has had a Foley catheter in place since his hospitalization for urinary retention. He says urine has been dark and foul smelling.   Significant labs troponin 123> 95, hemoglobin 7.5, platelet 46, sodium 132, potassium 4.3, creatinine 1.6, AST 210, ALT 103, alkaline phosphate 232, total bilirubin 2.1, UA shows negative nitrites negative leukocytes 11-20 WBC and many bacteria.  CT head negative for acute intracranial abnormality.  Patient was admitted for further evaluation and started on empiric antibiotics for UTI.   Assessment & Plan:   Principal Problem:   AKI (acute kidney injury) Active Problems:   Hyperlipidemia associated with type 2 diabetes mellitus (HCC)   Coronary artery disease due to lipid rich plaque   Type 2 diabetes mellitus (HCC)   Cirrhosis of liver (HCC)   Other pancytopenia (HCC)   Bilateral carotid artery stenosis   History of CVA  (cerebrovascular accident)   Infection associated with indwelling urinary catheter, initial encounter   Acute kidney injury: > Improving  Creatinine 1.60 on admission compared to baseline 1.1-1.2.   He appears volume depleted.  He has had Foley catheter in place since recent admission for urinary retention. Continue IV fluid hydration,  Holding metformin  Renal function slowly improving.  Creatinine 1.6 > 1.5    Indwelling Foley catheter with associated urinary tract infection, POA: UA with many bacteria.  Per report, he was recently diagnosed with UTI but only took half dose of his antibiotics.   Has ongoing dark and foul-smelling urine. Continue IV ceftriaxone  Follow urine culture Continue Foley care   Cirrhosis: Elevated LFTs: Patient with hepatic cirrhosis without evidence of decompensation.  LFTs mildly elevated.   Abdomen is soft, nontender.  RUQ U/S shows surgically absent gallbladder without bile duct dilatation.   Rise in LFTs may be related to statin. - Hold atorvastatin  - Repeat CMP shows improving LFTs.   CAD s/p CABG 2009 and PCI 2022 Elevated troponin: Troponin mildly elevated and downtrending on repeat.   Patient denies any chest pain.  EKG without significant ischemic changes.  Suspect demand ischemia. - Continue to monitor on telemetry - Continue aspirin  and Plavix . 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   He is scheduled for bone marrow biopsy on 06/19/2024.   WBC has normalized however has persistent and stable anemia and thrombocytopenia without evidence of bleeding.   Continue to monitor and consider PRBC transfusion  if hemoglobin continues to drop.   Generalized weakness: Does not appear to have acute focal weakness.   PT/OT eval.   Type 2 diabetes: Holding metformin .  Placed on sensitive SSI.   History of CVA: Bilateral carotid artery stenosis s/p left TCAR  04/21/2024 Hyperlipidemia: Continue aspirin , Plavix .  Holding atorvastatin  as above.   Depression: Continue Prozac .   DVT prophylaxis: SCDs Code Status: Full code Family Communication: Spouse at bed side. Disposition Plan:  Status is: Observation The patient remains OBS appropriate and will d/c before 2 midnights.   Patient admitted for UTI and acute kidney injury requiring IV antibiotics.  Consultants:  None  Procedures:   Antimicrobials: Anti-infectives (From admission, onward)    Start     Dose/Rate Route Frequency Ordered Stop   06/06/24 1800  cefTRIAXone  (ROCEPHIN ) 1 g in sodium chloride  0.9 % 100 mL IVPB        1 g 200 mL/hr over 30 Minutes Intravenous Every 24 hours 06/05/24 1943     06/05/24 1815  cefTRIAXone  (ROCEPHIN ) 1 g in sodium chloride  0.9 % 100 mL IVPB        1 g 200 mL/hr over 30 Minutes Intravenous  Once 06/05/24 1800 06/05/24 1943      Subjective: Patient was seen and examined at bedside.  Overnight events noted. Patient reports feeling better denies any pain but reports burning while urination.  Objective: Vitals:   06/06/24 0900 06/06/24 0935 06/06/24 0945 06/06/24 1302  BP: 123/77  (!) 120/98 121/71  Pulse: 73  77 76  Resp: 15  20 16   Temp:  97.6 F (36.4 C)  97.6 F (36.4 C)  TempSrc:      SpO2: 97%  99% 93%    Intake/Output Summary (Last 24 hours) at 06/06/2024 1541 Last data filed at 06/06/2024 0545 Gross per 24 hour  Intake 315 ml  Output 450 ml  Net -135 ml   There were no vitals filed for this visit.  Examination:  General exam: Appears calm and comfortable, deconditioned, not in any acute distress. Respiratory system: Clear to auscultation. Respiratory effort normal.  RR16 Cardiovascular system: S1 & S2 heard, RRR. No JVD, murmurs, rubs, gallops or clicks.  Gastrointestinal system: Abdomen is non distended, soft and non tender. Normal bowel sounds heard. Central nervous system: Alert and oriented x 3. No focal neurological  deficits. Extremities: No edema, no cyanosis, no clubbing. Skin: No rashes, lesions or ulcers Psychiatry: Judgement and insight appear normal. Mood & affect appropriate.     Data Reviewed: I have personally reviewed following labs and imaging studies  CBC: Recent Labs  Lab 06/05/24 1429 06/06/24 0252 06/06/24 1056  WBC 4.2 3.8*  --   NEUTROABS 3.7  --   --   HGB 7.5* 6.9* 8.2*  HCT 23.4* 20.4* 24.6*  MCV 95.5 92.7  --   PLT 46* 42*  --    Basic Metabolic Panel: Recent Labs  Lab 06/05/24 1429 06/06/24 0252  NA 132* 133*  K 4.3 3.9  CL 101 104  CO2 18* 20*  GLUCOSE 125* 118*  BUN 35* 36*  CREATININE 1.60* 1.52*  CALCIUM  7.9* 7.8*  MG 2.2  --    GFR: CrCl cannot be calculated (Unknown ideal weight.). Liver Function Tests: Recent Labs  Lab 06/05/24 1429 06/06/24 0252  AST 210* 172*  ALT 103* 92*  ALKPHOS 232* 213*  BILITOT 2.1* 1.8*  PROT 6.6 6.0*  ALBUMIN  2.3* 2.2*   No results for input(s): LIPASE, AMYLASE in the  last 168 hours. Recent Labs  Lab 06/05/24 1603  AMMONIA 21   Coagulation Profile: Recent Labs  Lab 06/05/24 1429  INR 1.3*   Cardiac Enzymes: No results for input(s): CKTOTAL, CKMB, CKMBINDEX, TROPONINI in the last 168 hours. BNP (last 3 results) No results for input(s): PROBNP in the last 8760 hours. HbA1C: No results for input(s): HGBA1C in the last 72 hours. CBG: Recent Labs  Lab 06/05/24 1602 06/06/24 0800 06/06/24 1128  GLUCAP 115* 106* 112*   Lipid Profile: No results for input(s): CHOL, HDL, LDLCALC, TRIG, CHOLHDL, LDLDIRECT in the last 72 hours. Thyroid  Function Tests: No results for input(s): TSH, T4TOTAL, FREET4, T3FREE, THYROIDAB in the last 72 hours. Anemia Panel: No results for input(s): VITAMINB12, FOLATE, FERRITIN, TIBC, IRON, RETICCTPCT in the last 72 hours. Sepsis Labs: No results for input(s): PROCALCITON, LATICACIDVEN in the last 168 hours.  No results  found for this or any previous visit (from the past 240 hours).       Radiology Studies: US  Abdomen Limited RUQ (LIVER/GB) Result Date: 06/05/2024 EXAM: Right Upper Quadrant Abdominal Ultrasound 06/05/2024 08:19:26 PM TECHNIQUE: Real-time ultrasonography of the right upper quadrant of the abdomen was performed. COMPARISON: CT abdomen and pelvis 12/13/2023. CLINICAL HISTORY: Elevated LFTs. FINDINGS: LIVER: Heterogeneous coarse liver echotexture diffusely with nodular contour and enlarged caudate lobe consistent with hepatic cirrhosis. Portal vein is not well demonstrated with indeterminate flow direction. No intrahepatic biliary ductal dilatation. No evidence of mass. BILIARY SYSTEM: The gallbladder is surgically absent. No bile duct dilatation. Extrahepatic bile ducts are obscured by overlying bowel gas and are not seen to be measured. OTHER: No right upper quadrant ascites. Incidental note of a right pleural effusion. IMPRESSION: 1. Hepatic cirrhosis with enlarged caudate lobe. 2. Portal vein not well visualized with indeterminate flow direction. 3. Surgically absent gallbladder with no bile duct dilatation; extrahepatic bile ducts are obscured by overlying bowel gas. 4. Right pleural effusion. Electronically signed by: Elsie Gravely MD 06/05/2024 08:23 PM EST RP Workstation: [id number]   CT HEAD WO CONTRAST Result Date: 06/05/2024 EXAM: CT HEAD WITHOUT CONTRAST 06/05/2024 03:55:58 PM TECHNIQUE: CT of the head was performed without the administration of intravenous contrast. Automated exposure control, iterative reconstruction, and/or weight based adjustment of the mA/kV was utilized to reduce the radiation dose to as low as reasonably achievable. COMPARISON: 04/15/2024 CLINICAL HISTORY: Acute neurological deficit; stroke suspected. FINDINGS: BRAIN AND VENTRICLES: Proportional prominence of ventricles and sulci, consistent with diffuse cerebral parenchymal volume loss. Moderate chronic microvascular  ischemic changes and encephalomalacia in the right frontal lobe primarily and the right middle frontal gyrus compatible with remote infarct. Additional areas of remote infarct in the left frontal lobe, in the bilateral parietal lobes near the vertex, in the anteromedial left temporal lobe, in the bilateral occipital cortices, and within the bilateral cerebellum. Dense calcifications in bilateral globus pallidus. Calcified atherosclerotic plaque in cavernous/supraclinoid ICA and intradural vertebral arteries. No acute hemorrhage. No evidence of acute infarct. No extra-axial collection. No mass effect or midline shift. ORBITS: Bilateral lens replacements noted. Irregular contour at the posterior aspect of both globes suggestive of axial myopia. SINUSES: No acute abnormality. SOFT TISSUES AND SKULL: No acute soft tissue abnormality. No skull fracture. IMPRESSION: 1. No acute intracranial abnormality. 2. Moderate chronic microvascular ischemic changes with multiple areas of encephalomalacia compatible with remote infarcts. Electronically signed by: Donnice Mania MD 06/05/2024 04:59 PM EST RP Workstation: [id number]    Scheduled Meds:  aspirin   81 mg Oral Daily   clopidogrel   75  mg Oral Daily   FLUoxetine   20 mg Oral Daily   insulin  aspart  0-9 Units Subcutaneous TID WC   sodium chloride  flush  3 mL Intravenous Q12H   Continuous Infusions:  cefTRIAXone  (ROCEPHIN )  IV       LOS: 0 days    Time spent: 50 mins    Darcel Dawley, MD [facility]   If 7PM-7AM, please contact night-coverage  "

## 2024-06-06 NOTE — Evaluation (Signed)
 Occupational Therapy Evaluation Patient Details Name: Christopher Roberts MRN: [id number] DOB: 01-10-1946 Today's Date: 06/06/2024   History of Present Illness   Pt is a 79 y.o. male admitted with AKI and generalized weakness. 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     Clinical Impressions Pt admitted with above. Pt questionable historian, oriented x 2 (name and location). Per chart review, pt lives at home with spouse and was recently discharged from [facility] beginning of January ambulatory with RW. Pt states he needs assist for all ADLs and sleeps in a hospital bed on the main level of his home. Has residual R-sided deficits from prior CVA including weakness, inattention, decreased FMC/GMC with WFL ROM. Pt found with multiple episodes of incontinent BM with brief + sheets soiled. Able to participate in bed mobility with MAX A +1, endorses dizziness immediately upon sitting upright and spontaneously returns to supine. PT entering room to assist. MAX A to doff soiled boxers, MOD A to roll R<>L for pericare and pt agreeable to sit at EOB to doff shirt/don gown.    Pt would benefit from skilled OT services to address noted impairments and functional limitations (see below for any additional details) in order to maximize safety and independence while minimizing falls risk and caregiver burden. Anticipate the need for follow up OT services upon acute hospital DC. Patient will benefit from continued inpatient follow up therapy, <3 hours/day.      If plan is discharge home, recommend the following:   Two people to help with walking and/or transfers;A lot of help with bathing/dressing/bathroom;Direct supervision/assist for medications management;Assist for transportation;Supervision due to cognitive status;Direct supervision/assist for financial management      Functional Status Assessment   Patient has had a recent decline in their functional status and demonstrates the ability to make significant improvements in function in a reasonable and predictable amount of time.     Equipment Recommendations   None recommended by OT      Precautions/Restrictions   Precautions Precautions: Fall Recall of Precautions/Restrictions: Impaired Precaution/Restrictions Comments: residual R-sided deficits from CVA Dec 2025 (weakness, inattention), indwelling Foley, incontinent bowel, orthostatic Restrictions Weight Bearing Restrictions Per Provider Order: No     Mobility Bed Mobility Overal bed mobility: Needs Assistance Bed Mobility: Rolling, Supine to Sit, Sit to Supine Rolling: Mod assist, +2 for physical assistance, +2 for safety/equipment   Supine to sit: Max assist Sit to supine: Mod assist, +2 for physical assistance, +2 for safety/equipment   General bed mobility comments: MAX A to transition from supine to sitting with +1, pt dizzy immediately upon transitioning upright and spontaneously returns to supine. sits end of session to doff/don shirt and gown    Transfers                   General transfer comment: NT due to pt soiled and need for pericare + linen change      Balance Overall balance assessment: Needs assistance Sitting-balance support: Feet unsupported, Bilateral upper extremity supported Sitting balance-Leahy Scale: Fair Sitting balance - Comments: poor tolerance due to dizziness (anticipate orhtostatic hypotension given hx, unable to obtain BP read)                                   ADL either performed or assessed with clinical judgement  ADL Overall ADL's : Needs assistance/impaired Eating/Feeding: Sitting;Minimal assistance   Grooming: Sitting;Minimal assistance   Upper Body Bathing: Sitting;Minimal assistance   Lower Body Bathing: Bed level;Maximal assistance;+2 for  safety/equipment Lower Body Bathing Details (indicate cue type and reason): bed level bathing + pericare Upper Body Dressing : Maximal assistance;Sitting Upper Body Dressing Details (indicate cue type and reason): sitting at EOB Lower Body Dressing: Bed level;Maximal assistance Lower Body Dressing Details (indicate cue type and reason): MAX A to doff soiled boxers   Toilet Transfer Details (indicate cue type and reason): unable to assess, pt dizzy upon sitting briefly at EOB (hx of orthostatic hypotension) Toileting- Clothing Manipulation and Hygiene: Bed level;Maximal assistance;+2 for physical assistance Toileting - Clothing Manipulation Details (indicate cue type and reason): MAX A for bed level pericare. pt found with multiple bouts of BM, brief leaking with stool, linens covered       General ADL Comments: pt participatory, able to sit at EOB x2 attempts. Pt participated in bed level rolling L<>R for pericare after multiple incontinent episodes of BM with brief leaking and stool dried on Foley cath line. Foley bag filled with brown, dark urine.     Vision   Vision Assessment?: Vision impaired- to be further tested in functional context            Pertinent Vitals/Pain Pain Assessment Pain Assessment: No/denies pain     Extremity/Trunk Assessment Upper Extremity Assessment Upper Extremity Assessment: RUE deficits/detail;Generalized weakness RUE Deficits / Details: residual R-sided deficits from prior CVA, R inattention, tends to keep RUE in flexor synergy at rest with ROM WFL RUE Coordination: decreased fine motor;decreased gross motor       Cervical / Trunk Assessment Cervical / Trunk Assessment: Normal   Communication     Cognition Arousal: Alert Behavior During Therapy: Flat affect Cognition: No family/caregiver present to determine baseline, Cognition impaired   Orientation impairments: Situation, Time Awareness: Intellectual awareness impaired, Online awareness  impaired Memory impairment (select all impairments): Short-term memory, Working memory Attention impairment (select first level of impairment): Focused attention Executive functioning impairment (select all impairments): Initiation, Organization, Sequencing, Reasoning, Problem solving OT - Cognition Comments: alert to self, and hospital. states he is here because he had a new stroke (CVA was December of 2025). Thinks it is Christmas.                         Cueing  General Comments      Additional time spent during session for thorough pericare, bed linen change, gown change.   Exercises     Shoulder Instructions      Home Living Family/patient expects to be discharged to:: Private residence Living Arrangements: Spouse/significant other;Other relatives Available Help at Discharge: Family;Available 24 hours/day Type of Home: House Home Access: Level entry     Home Layout: Two level;Bed/bath upstairs;1/2 bath on main level;Other (Comment) (pt reports he has been sleeping downstairs since rehab discharge) Alternate Level Stairs-Number of Steps: 12 Alternate Level Stairs-Rails: Can reach both;Left;Right Bathroom Shower/Tub: Walk-in shower;Other (comment)     Bathroom Accessibility: Yes How Accessible: Accessible via walker Home Equipment: Rolling Walker (2 wheels);Cane - single point;Rollator (4 wheels);Shower seat - built in;Hand held shower head          Prior Functioning/Environment Prior Level of Function : Needs assist;Patient poor historian/Family not available             Mobility Comments: uses RW ADLs Comments: requires assist from spouse. unsure of  accuracy as pt is a poor histoian. pt states he lives downstairs in a hospital bed.    OT Problem List: Decreased strength;Impaired balance (sitting and/or standing);Decreased cognition;Decreased knowledge of precautions;Decreased range of motion;Decreased activity tolerance;Decreased  coordination;Decreased knowledge of use of DME or AE   OT Treatment/Interventions: Self-care/ADL training;DME and/or AE instruction;Therapeutic activities;Balance training;Therapeutic exercise;Cognitive remediation/compensation;Neuromuscular education;Energy conservation;Patient/family education      OT Goals(Current goals can be found in the care plan section)   Acute Rehab OT Goals OT Goal Formulation: With patient Time For Goal Achievement: 06/20/24 Potential to Achieve Goals: Fair   OT Frequency:  Min 2X/week    Co-evaluation PT/OT/SLP Co-Evaluation/Treatment: Yes Reason for Co-Treatment: Complexity of the patient's impairments (multi-system involvement) PT goals addressed during session: Mobility/safety with mobility OT goals addressed during session: ADL's and self-care      AM-PAC OT 6 Clicks Daily Activity     Outcome Measure Help from another person eating meals?: A Little Help from another person taking care of personal grooming?: A Little Help from another person toileting, which includes using toliet, bedpan, or urinal?: A Lot Help from another person bathing (including washing, rinsing, drying)?: A Lot Help from another person to put on and taking off regular upper body clothing?: A Lot Help from another person to put on and taking off regular lower body clothing?: A Lot 6 Click Score: 14   End of Session Nurse Communication: Mobility status  Activity Tolerance: Patient tolerated treatment well Patient left: in bed;with call bell/phone within reach;with nursing/sitter in room;with bed alarm set  OT Visit Diagnosis: Unsteadiness on feet (R26.81);Muscle weakness (generalized) (M62.81);Other abnormalities of gait and mobility (R26.89)                Time: 8575-8484 OT Time Calculation (min): 51 min Charges:  OT General Charges $OT Visit: 1 Visit OT Evaluation $OT Eval Low Complexity: 1 Low OT Treatments $Self Care/Home Management : 23-37 mins  Lahoma Constantin L.  Terion Hedman, OTR/L  06/06/24, 3:48 PM

## 2024-06-06 NOTE — ED Notes (Signed)
 US  PIV placed, R AC, 20g 1.88

## 2024-06-06 NOTE — Progress Notes (Signed)
 Transition of Care [facility]) - Emergency Department Mini Assessment   Patient Details  Name: Christopher Roberts MRN: [id number] Date of Birth: 1946-03-29  [facility] CM/SW Contact:    Camelia JONETTA Cary, RN Phone Number: 06/06/2024, 2:55 PM   Clinical Narrative:  RNCM spoke to spouse regarding d/c plans. Spouse stated that she wants to speak to Provider regarding additional concerns prior to discussing d/c needs at this time.    ED Mini Assessment:                  Patient Contact and Communications        ,              Choice offered to / list presented to : (P) NA  Admission diagnosis:  Infection associated with indwelling urinary catheter, initial encounter [T83.511A] Patient Active Problem List   Diagnosis Date Noted   AKI (acute kidney injury) 06/05/2024   History of CVA (cerebrovascular accident) 06/05/2024   Infection associated with indwelling urinary catheter, initial encounter 06/05/2024   Orthostatic hypotension 05/10/2024   Expressive aphasia 05/06/2024   Left middle cerebral artery stroke (HCC) 04/25/2024   Bilateral carotid artery stenosis 04/21/2024   Acute ischemic cerebrovascular accident (CVA) involving internal carotid artery territory (HCC) 04/15/2024   Other pancytopenia (HCC) 03/17/2024   Cirrhosis of liver (HCC) 03/12/2024   Weight loss 03/12/2024   Arthralgia of right knee 04/12/2022   Pain in joint of right shoulder 04/12/2022   Stiffness of right shoulder joint 04/12/2022   Major depressive disorder, recurrent, moderate (HCC) 01/27/2022   Generalized anxiety disorder 01/27/2022   Arthralgia of right foot 12/10/2020   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   Depression 07/27/2020   STEMI involving right coronary artery (HCC) 07/25/2020   ST elevation myocardial infarction involving right coronary artery (HCC) 07/25/2020   Educated about COVID-19 virus infection 11/04/2019   Vertigo     Hypertensive urgency 08/30/2016   Type 2 diabetes mellitus (HCC) 08/30/2016   Nonspecific abnormal results of cardiovascular function study 10/25/2010   Shortness of breath 10/18/2010   Murmur 10/18/2010   Tobacco abuse 10/18/2010   History of colonic polyps 03/21/2010   Hyperlipidemia associated with type 2 diabetes mellitus (HCC) 10/13/2008   Essential hypertension 10/13/2008   Coronary artery disease due to lipid rich plaque 10/13/2008   CHEST PAIN-UNSPECIFIED 10/13/2008   CHEST PAIN-PRECORDIAL 10/13/2008   PCP:  [name], [name], MD Pharmacy:   [id number] - [place] - [address] [place] [address] Phone: [phone] Fax: [phone]  [facility] [address] [place] [address] Phone: [phone] Fax: [phone]

## 2024-06-06 NOTE — Plan of Care (Signed)

## 2024-06-07 ENCOUNTER — Encounter (HOSPITAL_COMMUNITY): Payer: Self-pay | Admitting: Internal Medicine

## 2024-06-07 DIAGNOSIS — N179 Acute kidney failure, unspecified: Secondary | ICD-10-CM | POA: Diagnosis not present

## 2024-06-07 LAB — GLUCOSE, CAPILLARY
Glucose-Capillary: 114 mg/dL — ABNORMAL HIGH (ref 70–99)
Glucose-Capillary: 119 mg/dL — ABNORMAL HIGH (ref 70–99)
Glucose-Capillary: 131 mg/dL — ABNORMAL HIGH (ref 70–99)
Glucose-Capillary: 94 mg/dL (ref 70–99)

## 2024-06-07 LAB — BASIC METABOLIC PANEL WITH GFR
Anion gap: 8 (ref 5–15)
BUN: 34 mg/dL — ABNORMAL HIGH (ref 8–23)
CO2: 22 mmol/L (ref 22–32)
Calcium: 8 mg/dL — ABNORMAL LOW (ref 8.9–10.3)
Chloride: 106 mmol/L (ref 98–111)
Creatinine, Ser: 1.31 mg/dL — ABNORMAL HIGH (ref 0.61–1.24)
GFR, Estimated: 55 mL/min — ABNORMAL LOW
Glucose, Bld: 138 mg/dL — ABNORMAL HIGH (ref 70–99)
Potassium: 4 mmol/L (ref 3.5–5.1)
Sodium: 136 mmol/L (ref 135–145)

## 2024-06-07 LAB — URINE CULTURE: Culture: NO GROWTH

## 2024-06-07 MED ORDER — CARMEX CLASSIC LIP BALM EX OINT
1.0000 | TOPICAL_OINTMENT | CUTANEOUS | Status: DC | PRN
  Administered 2024-06-09: 1 via TOPICAL
  Filled 2024-06-07 (×2): qty 10

## 2024-06-07 NOTE — Progress Notes (Signed)
 " PROGRESS NOTE    Christopher Roberts  FMW:[id number] DOB: 10-Jul-1945 DOA: 06/05/2024 PCP: [name], [name], MD   Brief Narrative:  79 y.o. 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   Patient recently admitted 04/15/2024-04/24/24 with acute CVA.  Initially presented with right-sided weakness.  MRI showed left small ACA and right punctate MCA/ACA infarcts felt secondary to large vessel disease from bilateral ICA stenosis.  He underwent TCAR on the left on 04/21/2024.  He was placed on DAPT with aspirin  and Plavix .  He was discharged to [facility] and subsequently discharged to home on 05/14/2024.   has been ambulating with use of a walker. He says that he did have some improvement in his right leg strength since leaving rehab. This morning he noticed that the right leg was weak again. He did not have any weakness in his arms. He has had a Foley catheter in place since his hospitalization for urinary retention. He says urine has been dark and foul smelling.   Significant labs troponin 123> 95, hemoglobin 7.5, platelet 46, sodium 132, potassium 4.3, creatinine 1.6, AST 210, ALT 103, alkaline phosphate 232, total bilirubin 2.1, UA shows negative nitrites negative leukocytes 11-20 WBC and many bacteria.  CT head negative for acute intracranial abnormality.  Patient was admitted for further evaluation and started on empiric antibiotics for UTI.   Assessment & Plan:   Principal Problem:   AKI (acute kidney injury) Active Problems:   Hyperlipidemia associated with type 2 diabetes mellitus (HCC)   Coronary artery disease due to lipid rich plaque   Type 2 diabetes mellitus (HCC)   Cirrhosis of liver (HCC)   Other pancytopenia (HCC)   Bilateral carotid artery stenosis   History of CVA  (cerebrovascular accident)   Infection associated with indwelling urinary catheter, initial encounter   Acute kidney injury: > Improving. Creatinine 1.60 on admission compared to baseline 1.1-1.2.   He appears volume depleted.  He has had Foley catheter in place since recent admission for urinary retention. Continue IV fluid hydration,  Holding metformin . Renal function slowly improving.  Creatinine 1.6 > 1.5 > 1.3   Indwelling Foley catheter with associated urinary tract infection, POA: UA with many bacteria.  Per report, he was recently diagnosed with UTI but only took half dose of his antibiotics.   Has ongoing dark and foul-smelling urine. Continue IV ceftriaxone  Urine culture  > No growth. Continue Foley care   Cirrhosis: Elevated LFTs: Patient with hepatic cirrhosis without evidence of decompensation.  LFTs mildly elevated.   Abdomen is soft, non tender.  RUQ U/S shows surgically absent gallbladder without bile duct dilatation.   Rise in LFTs may be related to statin. - Hold atorvastatin  - Repeat CMP shows improving LFTs.   CAD s/p CABG 2009 and PCI 2022 Elevated troponin: Troponin mildly elevated and downtrending on repeat.   Patient denies any chest pain.  EKG without significant ischemic changes.  Suspect demand ischemia. - Continue to monitor on telemetry - Continue aspirin  and Plavix . 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   He is scheduled for bone marrow biopsy on 06/19/2024.   WBC has normalized however has persistent and stable anemia and thrombocytopenia without evidence of bleeding.   S/p PRBC transfusion,  Hb improved to 8.2    Generalized weakness: Does not appear to have acute focal weakness.   PT/OT eval.> SNF   Type 2 diabetes: Holding metformin .  Placed on sensitive SSI.   History of CVA: Bilateral carotid artery stenosis s/p left TCAR  04/21/2024 Hyperlipidemia: Continue aspirin , Plavix .  Holding atorvastatin  as above.   Depression: Continue Prozac .   DVT prophylaxis: SCDs Code Status: Full code Family Communication: No family @ bed side. Disposition Plan:  Status is: Observation The patient remains OBS appropriate and will d/c before 2 midnights.     Patient admitted for UTI and acute kidney injury requiring IV antibiotics.  Urine cultures > No growth.  Patient is medically ready for discharge  pending SNF placement.  Consultants:  None  Procedures:   Antimicrobials: Anti-infectives (From admission, onward)    Start     Dose/Rate Route Frequency Ordered Stop   06/06/24 1800  cefTRIAXone  (ROCEPHIN ) 1 g in sodium chloride  0.9 % 100 mL IVPB        1 g 200 mL/hr over 30 Minutes Intravenous Every 24 hours 06/05/24 1943     06/05/24 1815  cefTRIAXone  (ROCEPHIN ) 1 g in sodium chloride  0.9 % 100 mL IVPB        1 g 200 mL/hr over 30 Minutes Intravenous  Once 06/05/24 1800 06/05/24 1943      Subjective: Patient was seen and examined at bedside.  Overnight events noted. Patient reports feeling better,  denies any pain. He reports still has urinary frequency.   Objective: Vitals:   06/06/24 1653 06/06/24 1952 06/06/24 2350 06/07/24 0355  BP: 119/67 109/62 112/66 104/62  Pulse: 79 79 76 76  Resp: 18 20 18 18   Temp: 97.6 F (36.4 C) (!) 97.4 F (36.3 C) 97.9 F (36.6 C) 97.6 F (36.4 C)  TempSrc: Oral Oral Oral Oral  SpO2:  100% 100% 100%    Intake/Output Summary (Last 24 hours) at 06/07/2024 1139 Last data filed at 06/07/2024 9147 Gross per 24 hour  Intake 3 ml  Output 240 ml  Net -237 ml   There were no vitals filed for this visit.  Examination:  General exam: Appears calm and comfortable, deconditioned, not in any acute distress. Respiratory system: CTA Bilaterally. Respiratory effort normal.  RR16 Cardiovascular system: S1 & S2 heard, RRR. No JVD, murmurs, rubs, gallops or clicks.   Gastrointestinal system: Abdomen is non distended, soft and non tender. Normal bowel sounds heard. Central nervous system: Alert and oriented x 3. No focal neurological deficits. Extremities: No edema, no cyanosis, no clubbing. Skin: No rashes, lesions or ulcers Psychiatry: Judgement and insight appear normal. Mood & affect appropriate.     Data Reviewed: I have personally reviewed following labs and imaging studies  CBC: Recent Labs  Lab 06/05/24 1429 06/06/24 0252 06/06/24 1056  WBC 4.2 3.8*  --   NEUTROABS 3.7  --   --   HGB 7.5* 6.9* 8.2*  HCT 23.4* 20.4* 24.6*  MCV 95.5 92.7  --   PLT 46* 42*  --    Basic Metabolic Panel: Recent Labs  Lab 06/05/24 1429 06/06/24 0252 06/07/24 0837  NA 132* 133* 136  K 4.3 3.9 4.0  CL 101 104 106  CO2 18* 20* 22  GLUCOSE 125* 118* 138*  BUN 35* 36* 34*  CREATININE 1.60* 1.52* 1.31*  CALCIUM  7.9* 7.8* 8.0*  MG 2.2  --   --    GFR: CrCl cannot be calculated (Unknown ideal weight.). Liver Function Tests:  Recent Labs  Lab 06/05/24 1429 06/06/24 0252  AST 210* 172*  ALT 103* 92*  ALKPHOS 232* 213*  BILITOT 2.1* 1.8*  PROT 6.6 6.0*  ALBUMIN  2.3* 2.2*   No results for input(s): LIPASE, AMYLASE in the last 168 hours. Recent Labs  Lab 06/05/24 1603  AMMONIA 21   Coagulation Profile: Recent Labs  Lab 06/05/24 1429  INR 1.3*   Cardiac Enzymes: No results for input(s): CKTOTAL, CKMB, CKMBINDEX, TROPONINI in the last 168 hours. BNP (last 3 results) No results for input(s): PROBNP in the last 8760 hours. HbA1C: No results for input(s): HGBA1C in the last 72 hours. CBG: Recent Labs  Lab 06/06/24 1128 06/06/24 1656 06/06/24 2159 06/07/24 0744 06/07/24 1112  GLUCAP 112* 117* 142* 114* 119*   Lipid Profile: No results for input(s): CHOL, HDL, LDLCALC, TRIG, CHOLHDL, LDLDIRECT in the last 72 hours. Thyroid  Function Tests: No results for input(s): TSH, T4TOTAL, FREET4, T3FREE,  THYROIDAB in the last 72 hours. Anemia Panel: No results for input(s): VITAMINB12, FOLATE, FERRITIN, TIBC, IRON, RETICCTPCT in the last 72 hours. Sepsis Labs: No results for input(s): PROCALCITON, LATICACIDVEN in the last 168 hours.  Recent Results (from the past 240 hours)  Urine Culture     Status: None   Collection Time: 06/05/24  2:29 PM   Specimen: Urine, Random  Result Value Ref Range Status   Specimen Description   Final    URINE, RANDOM Performed at [facility], [address] [place], [place] [address]    Special Requests   Final    NONE Reflexed from [id number] Performed at [facility], [address] [place], [place] [address]    Culture   Final    NO GROWTH Performed at [facility], [address], [place], [place] [address]    Report Status 06/07/2024 FINAL  Final    Radiology Studies: US  Abdomen Limited RUQ (LIVER/GB) Result Date: 06/05/2024 EXAM: Right Upper Quadrant Abdominal Ultrasound 06/05/2024 08:19:26 PM TECHNIQUE: Real-time ultrasonography of the right upper quadrant of the abdomen was performed. COMPARISON: CT abdomen and pelvis 12/13/2023. CLINICAL HISTORY: Elevated LFTs. FINDINGS: LIVER: Heterogeneous coarse liver echotexture diffusely with nodular contour and enlarged caudate lobe consistent with hepatic cirrhosis. Portal vein is not well demonstrated with indeterminate flow direction. No intrahepatic biliary ductal dilatation. No evidence of mass. BILIARY SYSTEM: The gallbladder is surgically absent. No bile duct dilatation. Extrahepatic bile ducts are obscured by overlying bowel gas and are not seen to be measured. OTHER: No right upper quadrant ascites. Incidental note of a right pleural effusion. IMPRESSION: 1. Hepatic cirrhosis with enlarged caudate lobe. 2. Portal vein not well visualized with indeterminate flow direction. 3. Surgically absent gallbladder with no bile duct dilatation;  extrahepatic bile ducts are obscured by overlying bowel gas. 4. Right pleural effusion. Electronically signed by: Elsie Gravely MD 06/05/2024 08:23 PM EST RP Workstation: [id number]   CT HEAD WO CONTRAST Result Date: 06/05/2024 EXAM: CT HEAD WITHOUT CONTRAST 06/05/2024 03:55:58 PM TECHNIQUE: CT of the head was performed without the administration of intravenous contrast. Automated exposure control, iterative reconstruction, and/or weight based adjustment of the mA/kV was utilized to reduce the radiation dose to as low as reasonably achievable. COMPARISON: 04/15/2024 CLINICAL HISTORY: Acute neurological deficit; stroke suspected. FINDINGS: BRAIN AND VENTRICLES: Proportional prominence of ventricles and sulci, consistent with diffuse cerebral parenchymal volume loss. Moderate chronic microvascular ischemic changes and encephalomalacia in the right frontal lobe primarily and the right middle frontal gyrus compatible with remote  infarct. Additional areas of remote infarct in the left frontal lobe, in the bilateral parietal lobes near the vertex, in the anteromedial left temporal lobe, in the bilateral occipital cortices, and within the bilateral cerebellum. Dense calcifications in bilateral globus pallidus. Calcified atherosclerotic plaque in cavernous/supraclinoid ICA and intradural vertebral arteries. No acute hemorrhage. No evidence of acute infarct. No extra-axial collection. No mass effect or midline shift. ORBITS: Bilateral lens replacements noted. Irregular contour at the posterior aspect of both globes suggestive of axial myopia. SINUSES: No acute abnormality. SOFT TISSUES AND SKULL: No acute soft tissue abnormality. No skull fracture. IMPRESSION: 1. No acute intracranial abnormality. 2. Moderate chronic microvascular ischemic changes with multiple areas of encephalomalacia compatible with remote infarcts. Electronically signed by: Donnice Mania MD 06/05/2024 04:59 PM EST RP Workstation: [id number]     Scheduled Meds:  aspirin   81 mg Oral Daily   Chlorhexidine  Gluconate Cloth  6 each Topical Daily   clopidogrel   75 mg Oral Daily   FLUoxetine   20 mg Oral Daily   insulin  aspart  0-9 Units Subcutaneous TID WC   sodium chloride  flush  3 mL Intravenous Q12H   Continuous Infusions:  cefTRIAXone  (ROCEPHIN )  IV 1 g (06/06/24 1745)     LOS: 0 days    Time spent: 35 mins    Darcel Dawley, MD [facility]   If 7PM-7AM, please contact night-coverage  "

## 2024-06-07 NOTE — Plan of Care (Signed)

## 2024-06-08 ENCOUNTER — Telehealth: Payer: Self-pay

## 2024-06-08 DIAGNOSIS — N179 Acute kidney failure, unspecified: Secondary | ICD-10-CM | POA: Diagnosis not present

## 2024-06-08 LAB — BASIC METABOLIC PANEL WITH GFR
Anion gap: 9 (ref 5–15)
BUN: 29 mg/dL — ABNORMAL HIGH (ref 8–23)
CO2: 21 mmol/L — ABNORMAL LOW (ref 22–32)
Calcium: 7.9 mg/dL — ABNORMAL LOW (ref 8.9–10.3)
Chloride: 107 mmol/L (ref 98–111)
Creatinine, Ser: 1.08 mg/dL (ref 0.61–1.24)
GFR, Estimated: >60 mL/min
Glucose, Bld: 101 mg/dL — ABNORMAL HIGH (ref 70–99)
Potassium: 3.9 mmol/L (ref 3.5–5.1)
Sodium: 137 mmol/L (ref 135–145)

## 2024-06-08 LAB — HEMOGLOBIN AND HEMATOCRIT, BLOOD
HCT: 23.3 % — ABNORMAL LOW (ref 39.0–52.0)
Hemoglobin: 7.7 g/dL — ABNORMAL LOW (ref 13.0–17.0)

## 2024-06-08 LAB — GLUCOSE, CAPILLARY
Glucose-Capillary: 95 mg/dL (ref 70–99)
Glucose-Capillary: 104 mg/dL — ABNORMAL HIGH (ref 70–99)
Glucose-Capillary: 105 mg/dL — ABNORMAL HIGH (ref 70–99)
Glucose-Capillary: 124 mg/dL — ABNORMAL HIGH (ref 70–99)

## 2024-06-08 NOTE — Progress Notes (Signed)
 " PROGRESS NOTE    Christopher Roberts  FMW:[id number] DOB: [birth year] DOA: 06/05/2024 PCP: [name], [name], MD   Brief Narrative:  79 y.o. 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   Patient recently admitted 04/15/2024-04/24/24 with acute CVA.  Initially presented with right-sided weakness.  MRI showed left small ACA and right punctate MCA/ACA infarcts felt secondary to large vessel disease from bilateral ICA stenosis.  He underwent TCAR on the left on 04/21/2024.  He was placed on DAPT with aspirin  and Plavix .  He was discharged to [facility] and subsequently discharged to home on 05/14/2024.   has been ambulating with use of a walker. He says that he did have some improvement in his right leg strength since leaving rehab. This morning he noticed that the right leg was weak again. He did not have any weakness in his arms. He has had a Foley catheter in place since his hospitalization for urinary retention. He says urine has been dark and foul smelling.   Significant labs troponin 123> 95, hemoglobin 7.5, platelet 46, sodium 132, potassium 4.3, creatinine 1.6, AST 210, ALT 103, alkaline phosphate 232, total bilirubin 2.1, UA shows negative nitrites negative leukocytes 11-20 WBC and many bacteria.  CT head negative for acute intracranial abnormality.  Patient was admitted for further evaluation and started on empiric antibiotics for UTI.   Assessment & Plan:   Principal Problem:   AKI (acute kidney injury) Active Problems:   Hyperlipidemia associated with type 2 diabetes mellitus (HCC)   Coronary artery disease due to lipid rich plaque   Type 2 diabetes mellitus (HCC)   Cirrhosis of liver (HCC)   Other pancytopenia (HCC)   Bilateral carotid artery stenosis   History of CVA  (cerebrovascular accident)   Infection associated with indwelling urinary catheter, initial encounter   Acute kidney injury: > Resolved. Creatinine 1.60 on admission compared to baseline 1.1-1.2.   He appears volume depleted.  He has had Foley catheter in place since recent admission for urinary retention. Continue IV fluid hydration,  Holding metformin . Renal function back to baseline.  Creatinine 1.6 > 1.5 > 1.3 > 1.08   Indwelling Foley catheter with associated urinary tract infection, POA: UA with many bacteria.  Per report, he was recently diagnosed with UTI but only took half dose of his antibiotics.   Has ongoing dark and foul-smelling urine. Continue IV ceftriaxone  Urine culture  > No growth. Continue Foley care.   Cirrhosis: Elevated LFTs: Patient with hepatic cirrhosis without evidence of decompensation.  LFTs mildly elevated.   Abdomen is soft, non tender.  RUQ U/S shows surgically absent gallbladder without bile duct dilatation.   Rise in LFTs may be related to statin. - Hold atorvastatin  - Repeat CMP shows improving LFTs.   CAD s/p CABG 2009 and PCI 2022 Elevated troponin: Troponin mildly elevated and downtrending on repeat.   Patient denies any chest pain.  EKG without significant ischemic changes.  Suspect demand ischemia. - Continue to monitor on telemetry - Continue aspirin  and Plavix . - Holding atorvastatin  as above.   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   He is scheduled for bone marrow biopsy on 06/19/2024.   WBC has normalized however has persistent and stable anemia and thrombocytopenia without evidence of bleeding.  S/p PRBC transfusion, Hb improved to 8.2    Generalized weakness: Does not appear to have acute focal weakness.   PT/OT eval.> SNF   Type 2 diabetes: Holding metformin .  Placed on sensitive SSI.   History of CVA: Bilateral carotid artery stenosis s/p left TCAR  04/21/2024 Hyperlipidemia: Continue aspirin , Plavix .  Holding atorvastatin  as above.   Depression: Continue Prozac .   DVT prophylaxis: SCDs Code Status: Full code Family Communication: No family @ bed side. Disposition Plan:  Status is: Observation The patient remains OBS appropriate and will d/c before 2 midnights.   Patient admitted for UTI and acute kidney injury requiring IV antibiotics.  Urine cultures > No growth.  Patient is medically ready for discharge  pending SNF placement.  Consultants:  None  Procedures:   Antimicrobials: Anti-infectives (From admission, onward)    Start     Dose/Rate Route Frequency Ordered Stop   06/06/24 1800  cefTRIAXone  (ROCEPHIN ) 1 g in sodium chloride  0.9 % 100 mL IVPB        1 g 200 mL/hr over 30 Minutes Intravenous Every 24 hours 06/05/24 1943     06/05/24 1815  cefTRIAXone  (ROCEPHIN ) 1 g in sodium chloride  0.9 % 100 mL IVPB        1 g 200 mL/hr over 30 Minutes Intravenous  Once 06/05/24 1800 06/05/24 1943      Subjective: Patient was seen and examined at bedside.  Overnight events noted. Patient reports feeling better,  denies any pain. He reports still has urinary frequency.  Renal Functions  back to baseline.   Objective: Vitals:   06/07/24 0355 06/07/24 1341 06/07/24 2017 06/08/24 0408  BP: 104/62 108/60 105/62 108/66  Pulse: 76 77 75 77  Resp: 18 18 16 16   Temp: 97.6 F (36.4 C) 98.1 F (36.7 C) 97.9 F (36.6 C) 97.9 F (36.6 C)  TempSrc: Oral Oral Oral Oral  SpO2: 100% 100% 100% 98%    Intake/Output Summary (Last 24 hours) at 06/08/2024 1117 Last data filed at 06/08/2024 0900 Gross per 24 hour  Intake 120 ml  Output --  Net 120 ml   There were no vitals filed for this visit.  Examination:  General exam: Appears calm and comfortable, deconditioned, not in any acute distress. Respiratory system: CTA Bilaterally. Respiratory effort normal.  [id number] Cardiovascular system: S1 & S2 heard, RRR. No JVD, murmurs,  rubs, gallops or clicks.  Gastrointestinal system: Abdomen is non distended, soft and non tender. Normal bowel sounds heard. Central nervous system: Alert and oriented x 3. No focal neurological deficits. Extremities: No edema, no cyanosis, no clubbing. Skin: No rashes, lesions or ulcers Psychiatry: Judgement and insight appear normal. Mood & affect appropriate.     Data Reviewed: I have personally reviewed following labs and imaging studies  CBC: Recent Labs  Lab 06/05/24 1429 06/06/24 0252 06/06/24 1056 06/08/24 0740  WBC 4.2 3.8*  --   --   NEUTROABS 3.7  --   --   --   HGB 7.5* 6.9* 8.2* 7.7*  HCT 23.4* 20.4* 24.6* 23.3*  MCV 95.5 92.7  --   --   PLT 46* 42*  --   --    Basic Metabolic Panel: Recent Labs  Lab 06/05/24 1429 06/06/24 0252 06/07/24 0837 06/08/24 0740  NA 132* 133* 136 137  K 4.3 3.9 4.0 3.9  CL 101 104 106 107  CO2 18* 20* 22 21*  GLUCOSE 125* 118* 138* 101*  BUN 35* 36* 34* 29*  CREATININE  1.60* 1.52* 1.31* 1.08  CALCIUM  7.9* 7.8* 8.0* 7.9*  MG 2.2  --   --   --    GFR: CrCl cannot be calculated (Unknown ideal weight.). Liver Function Tests: Recent Labs  Lab 06/05/24 1429 06/06/24 0252  AST 210* 172*  ALT 103* 92*  ALKPHOS 232* 213*  BILITOT 2.1* 1.8*  PROT 6.6 6.0*  ALBUMIN  2.3* 2.2*   No results for input(s): LIPASE, AMYLASE in the last 168 hours. Recent Labs  Lab 06/05/24 1603  AMMONIA 21   Coagulation Profile: Recent Labs  Lab 06/05/24 1429  INR 1.3*   Cardiac Enzymes: No results for input(s): CKTOTAL, CKMB, CKMBINDEX, TROPONINI in the last 168 hours. BNP (last 3 results) No results for input(s): PROBNP in the last 8760 hours. HbA1C: No results for input(s): HGBA1C in the last 72 hours. CBG: Recent Labs  Lab 06/07/24 0744 06/07/24 1112 06/07/24 1635 06/07/24 2118 06/08/24 0743  GLUCAP 114* 119* 131* 94 95   Lipid Profile: No results for input(s): CHOL, HDL, LDLCALC, TRIG, CHOLHDL,  LDLDIRECT in the last 72 hours. Thyroid  Function Tests: No results for input(s): TSH, T4TOTAL, FREET4, T3FREE, THYROIDAB in the last 72 hours. Anemia Panel: No results for input(s): VITAMINB12, FOLATE, FERRITIN, TIBC, IRON, RETICCTPCT in the last 72 hours. Sepsis Labs: No results for input(s): PROCALCITON, LATICACIDVEN in the last 168 hours.  Recent Results (from the past 240 hours)  Urine Culture     Status: None   Collection Time: 06/05/24  2:29 PM   Specimen: Urine, Random  Result Value Ref Range Status   Specimen Description   Final    URINE, RANDOM Performed at [facility], [address] [place], [place] [address]    Special Requests   Final    NONE Reflexed from [id number] Performed at [facility], [address], [place], [place] [address]    Culture   Final    NO GROWTH Performed at [facility], [address], [place], [place] [address]    Report Status 06/07/2024 FINAL  Final    Radiology Studies: No results found.   Scheduled Meds:  aspirin   81 mg Oral Daily   Chlorhexidine  Gluconate Cloth  6 each Topical Daily   clopidogrel   75 mg Oral Daily   FLUoxetine   20 mg Oral Daily   insulin  aspart  0-9 Units Subcutaneous TID WC   sodium chloride  flush  3 mL Intravenous Q12H   Continuous Infusions:  cefTRIAXone  (ROCEPHIN )  IV 1 g (06/07/24 1710)     LOS: 0 days    Time spent: 35 mins    Darcel Dawley, MD [facility]   If 7PM-7AM, please contact night-coverage  "

## 2024-06-08 NOTE — Telephone Encounter (Signed)
 Monday 06/09/24 appointments canceled. Patient is aware and scheduling will call to reschedule.

## 2024-06-08 NOTE — Plan of Care (Signed)

## 2024-06-09 ENCOUNTER — Inpatient Hospital Stay

## 2024-06-09 ENCOUNTER — Inpatient Hospital Stay: Admitting: Oncology

## 2024-06-09 DIAGNOSIS — N179 Acute kidney failure, unspecified: Secondary | ICD-10-CM | POA: Diagnosis not present

## 2024-06-09 LAB — HEMOGLOBIN AND HEMATOCRIT, BLOOD
HCT: 21.9 % — ABNORMAL LOW (ref 39.0–52.0)
HCT: 22.9 % — ABNORMAL LOW (ref 39.0–52.0)
Hemoglobin: 7.2 g/dL — ABNORMAL LOW (ref 13.0–17.0)
Hemoglobin: 7.8 g/dL — ABNORMAL LOW (ref 13.0–17.0)

## 2024-06-09 LAB — TYPE AND SCREEN
ABO/RH(D): A POS
Antibody Screen: NEGATIVE
Unit division: 0

## 2024-06-09 LAB — GLUCOSE, CAPILLARY
Glucose-Capillary: 93 mg/dL (ref 70–99)
Glucose-Capillary: 165 mg/dL — ABNORMAL HIGH (ref 70–99)
Glucose-Capillary: 113 mg/dL — ABNORMAL HIGH (ref 70–99)
Glucose-Capillary: 112 mg/dL — ABNORMAL HIGH (ref 70–99)

## 2024-06-09 LAB — BPAM RBC
Blood Product Expiration Date: 202602272359
ISSUE DATE / TIME: 202601300400
Unit Type and Rh: 6200

## 2024-06-09 NOTE — Progress Notes (Addendum)
 Physical Therapy Treatment Patient Details Name: Christopher Roberts MRN: [id number] DOB: 1945/07/13 Today's Date: 06/09/2024   History of Present Illness Pt is a 79 y.o. male admitted with AKI and generalized weakness. 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    PT Comments  Pt supine in bed, agreeable to therapy. Pt needing mod A to power up to sidesitting position, reports shortness breath and appears to be labored, assisted back to supine; SPO2 100% on RA. Pt assisted mod A+2 on 2nd attempt to sit at bedside, denies shortness of breath with more assistance. Once seated, pt powers to stand with min A+2 from elevated bed, after ~3 sec standing pt reports pain in bil calf, reports shortness of breath, appears labored in breathing and returns to sitting. After ~3 min seated rest, completed additional sit to stand from bedside, with same results of shortness of breath and desire to sit after ~3 sec. Once seated pt labored, requesting to return to supine, mod A+2 to return to supine and scoot up. Pt's lunch tray arrived, assisted to sit up high in bed and lunch tray provided. Vitals detailed below, pt on RA. Patient will benefit from continued inpatient follow up therapy, <3 hours/day.  BP in supine:113/61 BP after sidesit once returned to supine: 124/67 BP static sitting: 127/72 BP after return to supine after sit to stands: 112/65 Pt on RA with SpO2 100% and HR 70s-90s throughout session; notified RN Of vitals   If plan is discharge home, recommend the following: A lot of help with walking and/or transfers;A lot of help with bathing/dressing/bathroom;Assistance with cooking/housework;Assist for transportation;Help with stairs or ramp for entrance   Can travel by private vehicle     No  Equipment Recommendations  None recommended by PT    Recommendations  for Other Services       Precautions / Restrictions Precautions Precautions: Fall Recall of Precautions/Restrictions: Impaired Precaution/Restrictions Comments: residual R-sided deficits from CVA Dec 2025 (weakness, inattention), indwelling Foley Restrictions Weight Bearing Restrictions Per Provider Order: No     Mobility  Bed Mobility Overal bed mobility: Needs Assistance Bed Mobility: Supine to Sit, Sit to Supine     Supine to sit: Mod assist, +2 for physical assistance Sit to supine: Mod assist, +2 for physical assistance   General bed mobility comments: mod A to power up to side sitting position with pt reporting fatigues, appears short of breath and requires assist to return to supine; on 2nd attempt pt needing mod A+2 to mobilize BLE to bedside and upright trunk without shortness of breath, again mod A+2 to return to supine and scoot up in bed    Transfers Overall transfer level: Needs assistance Equipment used: Rolling walker (2 wheels) Transfers: Sit to/from Stand Sit to Stand: Min assist, +2 physical assistance, +2 safety/equipment, From elevated surface           General transfer comment: min A+2 to power up from elevated bedside, pt reports pain in bil calf they just give way and appears short of breath, able to complete 2 sit to stand reps from bedside then requests to return to supine due to significant fatigue - VSS On RA    Ambulation/Gait                   Stairs             Wheelchair Mobility  Tilt Bed    Modified Rankin (Stroke Patients Only)       Balance Overall balance assessment: Needs assistance Sitting-balance support: Feet supported Sitting balance-Leahy Scale: Fair Sitting balance - Comments: intermittent UE support   Standing balance support: Reliant on assistive device for balance, During functional activity, Bilateral upper extremity supported Standing balance-Leahy Scale: Poor                               Communication Communication Communication: No apparent difficulties  Cognition Arousal: Alert Behavior During Therapy: Flat affect   PT - Cognitive impairments: No family/caregiver present to determine baseline                       PT - Cognition Comments: pt oriented to self and location, reports different home set up compared to eval, no family present to determine baseline cognition, follows commands with cues Following commands: Intact      Cueing Cueing Techniques: Verbal cues, Gestural cues  Exercises      General Comments General comments (skin integrity, edema, etc.): Pt reporting extreme shortness of breath with positional changes, on RA with SpO2 100%, HR 70s-90s, BP WNL - notified RN      Pertinent Vitals/Pain Pain Assessment Pain Assessment: No/denies pain    Home Living                          Prior Function            PT Goals (current goals can now be found in the care plan section) Acute Rehab PT Goals Patient Stated Goal: can you get me a warm blanket? PT Goal Formulation: With patient Time For Goal Achievement: 06/20/24 Potential to Achieve Goals: Fair Progress towards PT goals: Progressing toward goals    Frequency    Min 2X/week      PT Plan      Co-evaluation              AM-PAC PT 6 Clicks Mobility   Outcome Measure  Help needed turning from your back to your side while in a flat bed without using bedrails?: A Lot Help needed moving from lying on your back to sitting on the side of a flat bed without using bedrails?: A Lot Help needed moving to and from a bed to a chair (including a wheelchair)?: A Lot Help needed standing up from a chair using your arms (e.g., wheelchair or bedside chair)?: A Lot Help needed to walk in hospital room?: Total Help needed climbing 3-5 steps with a railing? : Total 6 Click Score: 10    End of Session Equipment Utilized During Treatment: Gait belt Activity  Tolerance: Treatment limited secondary to medical complications (Comment) Patient left: in bed;with call bell/phone within reach;with bed alarm set Nurse Communication: Mobility status PT Visit Diagnosis: Muscle weakness (generalized) (M62.81);Other abnormalities of gait and mobility (R26.89)     Time: 8859-8795 PT Time Calculation (min) (ACUTE ONLY): 24 min  Charges:    $Therapeutic Activity: 23-37 mins PT General Charges $$ ACUTE PT VISIT: 1 Visit                     Tori Lorrie Gargan PT, DPT 06/09/24, 12:29 PM

## 2024-06-09 NOTE — Plan of Care (Signed)

## 2024-06-09 NOTE — NC FL2 (Signed)
 " [place]  MEDICAID FL2 LEVEL OF CARE FORM     IDENTIFICATION  Patient Name: Christopher Roberts Birthdate: [birth year] Sex: male Admission Date (Current Location): 06/05/2024  [facility] and [place] Number:  Producer, Television/film/video and Address:  [facility],  [address], [place] [address]      Provider Number: [id number]  Attending Physician Name and Address:  Leotis Bogus, MD  Relative Name and Phone Number:  Mesiah, Manzo, Emergency Contact  [phone]    Current Level of Care: SNF Recommended Level of Care: Skilled Nursing Facility Prior Approval Number:    Date Approved/Denied:   PASRR Number:    Discharge Plan: SNF    Current Diagnoses: Patient Active Problem List   Diagnosis Date Noted   AKI (acute kidney injury) 06/05/2024   History of CVA (cerebrovascular accident) 06/05/2024   Infection associated with indwelling urinary catheter, initial encounter 06/05/2024   Orthostatic hypotension 05/10/2024   Expressive aphasia 05/06/2024   Left middle cerebral artery stroke (HCC) 04/25/2024   Bilateral carotid artery stenosis 04/21/2024   Acute ischemic cerebrovascular accident (CVA) involving internal carotid artery territory (HCC) 04/15/2024   Other pancytopenia (HCC) 03/17/2024   Cirrhosis of liver (HCC) 03/12/2024   Weight loss 03/12/2024   Arthralgia of right knee 04/12/2022   Pain in joint of right shoulder 04/12/2022   Stiffness of right shoulder joint 04/12/2022   Major depressive disorder, recurrent, moderate (HCC) 01/27/2022   Generalized anxiety disorder 01/27/2022   Arthralgia of right foot 12/10/2020   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   Depression 07/27/2020   STEMI involving right coronary artery (HCC) 07/25/2020   ST elevation myocardial infarction involving right coronary artery (HCC) 07/25/2020   Educated about COVID-19 virus infection 11/04/2019   Vertigo    Hypertensive urgency  08/30/2016   Type 2 diabetes mellitus (HCC) 08/30/2016   Nonspecific abnormal results of cardiovascular function study 10/25/2010   Shortness of breath 10/18/2010   Murmur 10/18/2010   Tobacco abuse 10/18/2010   History of colonic polyps 03/21/2010   Hyperlipidemia associated with type 2 diabetes mellitus (HCC) 10/13/2008   Essential hypertension 10/13/2008   Coronary artery disease due to lipid rich plaque 10/13/2008   CHEST PAIN-UNSPECIFIED 10/13/2008   CHEST PAIN-PRECORDIAL 10/13/2008    Orientation RESPIRATION BLADDER Height & Weight     Self, Time, Situation, Place  Normal Indwelling catheter Weight:   Height:     BEHAVIORAL SYMPTOMS/MOOD NEUROLOGICAL BOWEL NUTRITION STATUS      Continent Diet  AMBULATORY STATUS COMMUNICATION OF NEEDS Skin   Extensive Assist Verbally Normal                       Personal Care Assistance Level of Assistance  Bathing, Feeding, Dressing Bathing Assistance: Limited assistance Feeding assistance: Limited assistance Dressing Assistance: Limited assistance     Functional Limitations Info  Sight, Hearing, Speech Sight Info: Adequate Hearing Info: Adequate Speech Info: Adequate    SPECIAL CARE FACTORS FREQUENCY  PT (By licensed PT), OT (By licensed OT)     PT Frequency: 5x weekly OT Frequency: 5x weekly            Contractures Contractures Info: Not present    Additional Factors Info  Code Status, Allergies, Psychotropic, Insulin  Sliding Scale Code Status Info: FULL Allergies Info: NKDA Psychotropic Info: Prozac  Insulin  Sliding Scale Info: insulin  aspart (novoLOG ) injection 0-9 Units 0-9 Units, Subcutaneous, 3 times daily with meals, First dose  on Fri 06/06/24 at 0800  Correction coverage: Sensitive (thin, NPO, renal)  CBG < 70: CBG 70 - 120: 0 units  CBG 121 - 150: 1 unit  CBG 151 - 200: 2 units  CBG 201 - 250: 3 units  CBG 251 - 300: 5 units  CBG 301 - 350: 7 units  CBG 351 - 400: 9 units       Current Medications  (06/09/2024):  This is the current hospital active medication list Current Facility-Administered Medications  Medication Dose Route Frequency Provider Last Rate Last Admin   aspirin  chewable tablet 81 mg  81 mg Oral Daily Patel, Vishal R, MD   81 mg at 06/09/24 9083   bisacodyl  (DULCOLAX) EC tablet 5 mg  5 mg Oral Daily PRN Patel, Vishal R, MD       cefTRIAXone  (ROCEPHIN ) 1 g in sodium chloride  0.9 % 100 mL IVPB  1 g Intravenous Q24H Patel, Vishal R, MD 200 mL/hr at 06/08/24 1800 1 g at 06/08/24 1800   Chlorhexidine  Gluconate Cloth 2 % PADS 6 each  6 each Topical Daily Leotis Bogus, MD   6 each at 06/09/24 1215   clopidogrel  (PLAVIX ) tablet 75 mg  75 mg Oral Daily Patel, Vishal R, MD   75 mg at 06/09/24 9083   FLUoxetine  (PROZAC ) capsule 20 mg  20 mg Oral Daily Patel, Vishal R, MD   20 mg at 06/09/24 0916   insulin  aspart (novoLOG ) injection 0-9 Units  0-9 Units Subcutaneous TID WC Patel, Vishal R, MD   2 Units at 06/09/24 1228   lip balm (CARMEX) ointment 1 Application  1 Application Topical PRN Ringler, Marlena L, RN   1 Application at 06/09/24 9083   melatonin tablet 5 mg  5 mg Oral QHS PRN Patel, Vishal R, MD   5 mg at 06/08/24 2219   ondansetron  (ZOFRAN ) tablet 4 mg  4 mg Oral Q6H PRN Patel, Vishal R, MD       Or   ondansetron  (ZOFRAN ) injection 4 mg  4 mg Intravenous Q6H PRN Patel, Vishal R, MD       senna-docusate (Senokot-S) tablet 1 tablet  1 tablet Oral QHS PRN Patel, Vishal R, MD       sodium chloride  flush (NS) 0.9 % injection 3 mL  3 mL Intravenous Q12H Patel, Vishal R, MD   3 mL at 06/09/24 9078     Discharge Medications: Please see discharge summary for a list of discharge medications.  Relevant Imaging Results:  Relevant Lab Results:   Additional Information SSN [id number]  Doneta Glenys DASEN, RN     "

## 2024-06-09 NOTE — TOC Progression Note (Addendum)
 Transition of Care [facility]) - Progression Note    Patient Details  Name: Christopher Roberts MRN: [id number] Date of Birth: 1945/10/31  Transition of Care [facility]) CM/SW Contact  Westin Knotts, Nathanel, RN Phone Number: 06/09/2024, 7:07 PM  Clinical Narrative:  awaiting for 30day note to be co signed for pasrr.  -7:45p-Info requested for pasrr uploaded to [facility]-await pasrr.                      Expected Discharge Plan and Services                                               Social Drivers of Health (SDOH) Interventions SDOH Screenings   Food Insecurity: No Food Insecurity (06/06/2024)  Recent Concern: Food Insecurity - Food Insecurity Present (03/17/2024)  Housing: Low Risk (06/06/2024)  Transportation Needs: No Transportation Needs (06/06/2024)  Utilities: Not At Risk (06/06/2024)  Recent Concern: Utilities - At Risk (03/17/2024)  Alcohol Screen: Low Risk (03/12/2024)  Depression (PHQ2-9): High Risk (03/17/2024)  Financial Resource Strain: Low Risk (03/12/2024)  Physical Activity: Inactive (03/12/2024)  Social Connections: Moderately Isolated (06/06/2024)  Stress: No Stress Concern Present (03/12/2024)  Tobacco Use: Medium Risk (06/07/2024)  Health Literacy: Adequate Health Literacy (03/12/2024)    Readmission Risk Interventions    04/24/2024    2:05 PM  Readmission Risk Prevention Plan  Post Dischage Appt Complete  Medication Screening Complete  Transportation Screening Complete

## 2024-06-09 NOTE — TOC PASRR Note (Signed)
 30 Day PASRR Note   Patient Details  Name: Christopher Roberts Date of Birth: 12-06-1945   Transition of Care [facility]) CM/SW Contact:    Doneta Glenys DASEN, RN Phone Number: 06/09/2024, 4:40 PM  To Whom It May Concern:  Please be advised that this patient will require a short-term nursing home stay - anticipated 30 days or less for rehabilitation and strengthening.   The plan is for return home.

## 2024-06-09 NOTE — Plan of Care (Signed)
" °  Problem: Tissue Perfusion: Goal: Adequacy of tissue perfusion will improve 06/09/2024 0734 by Juanita Leisa CROME, RN Outcome: Not Progressing 06/09/2024 0731 by Juanita Leisa CROME, RN Outcome: Not Progressing   Problem: Elimination: Goal: Will not experience complications related to bowel motility 06/09/2024 0734 by Juanita Leisa CROME, RN Outcome: Not Progressing 06/09/2024 0731 by Juanita Leisa CROME, RN Outcome: Not Progressing   "

## 2024-06-10 DIAGNOSIS — N179 Acute kidney failure, unspecified: Secondary | ICD-10-CM | POA: Diagnosis not present

## 2024-06-10 LAB — COMPREHENSIVE METABOLIC PANEL WITH GFR
ALT: 99 U/L — ABNORMAL HIGH (ref 0–44)
AST: 159 U/L — ABNORMAL HIGH (ref 15–41)
Albumin: 2.2 g/dL — ABNORMAL LOW (ref 3.5–5.0)
Alkaline Phosphatase: 194 U/L — ABNORMAL HIGH (ref 38–126)
Anion gap: 8 (ref 5–15)
BUN: 29 mg/dL — ABNORMAL HIGH (ref 8–23)
CO2: 22 mmol/L (ref 22–32)
Calcium: 7.9 mg/dL — ABNORMAL LOW (ref 8.9–10.3)
Chloride: 107 mmol/L (ref 98–111)
Creatinine, Ser: 1.01 mg/dL (ref 0.61–1.24)
GFR, Estimated: >60 mL/min
Glucose, Bld: 104 mg/dL — ABNORMAL HIGH (ref 70–99)
Potassium: 3.8 mmol/L (ref 3.5–5.1)
Sodium: 136 mmol/L (ref 135–145)
Total Bilirubin: 1.4 mg/dL — ABNORMAL HIGH (ref 0.0–1.2)
Total Protein: 6.1 g/dL — ABNORMAL LOW (ref 6.5–8.1)

## 2024-06-10 LAB — CBC
HCT: 22.2 % — ABNORMAL LOW (ref 39.0–52.0)
Hemoglobin: 7.1 g/dL — ABNORMAL LOW (ref 13.0–17.0)
MCH: 30.7 pg (ref 26.0–34.0)
MCHC: 32 g/dL (ref 30.0–36.0)
MCV: 96.1 fL (ref 80.0–100.0)
Platelets: 32 10*3/uL — ABNORMAL LOW (ref 150–400)
RBC: 2.31 MIL/uL — ABNORMAL LOW (ref 4.22–5.81)
RDW: 20.2 % — ABNORMAL HIGH (ref 11.5–15.5)
WBC: 3.5 10*3/uL — ABNORMAL LOW (ref 4.0–10.5)
nRBC: 1.4 % — ABNORMAL HIGH (ref 0.0–0.2)

## 2024-06-10 LAB — GLUCOSE, CAPILLARY
Glucose-Capillary: 109 mg/dL — ABNORMAL HIGH (ref 70–99)
Glucose-Capillary: 134 mg/dL — ABNORMAL HIGH (ref 70–99)
Glucose-Capillary: 145 mg/dL — ABNORMAL HIGH (ref 70–99)
Glucose-Capillary: 142 mg/dL — ABNORMAL HIGH (ref 70–99)

## 2024-06-10 MED ORDER — SENNOSIDES-DOCUSATE SODIUM 8.6-50 MG PO TABS
1.0000 | ORAL_TABLET | Freq: Two times a day (BID) | ORAL | Status: DC
  Administered 2024-06-10 – 2024-06-12 (×5): 1 via ORAL
  Filled 2024-06-10 (×5): qty 1

## 2024-06-10 MED ORDER — POLYETHYLENE GLYCOL 3350 17 G PO PACK
17.0000 g | PACK | Freq: Every day | ORAL | Status: DC
  Administered 2024-06-10 – 2024-06-12 (×3): 17 g via ORAL
  Filled 2024-06-10 (×3): qty 1

## 2024-06-10 NOTE — TOC Progression Note (Signed)
 Transition of Care [facility]) - Progression Note    Patient Details  Name: Christopher Roberts MRN: [id number] Date of Birth: 08-21-1945  [facility] of Care [facility]) CM/SW Contact  Doneta Glenys DASEN, RN Phone Number: 06/10/2024, 12:20 PM  Clinical Narrative:     New F2 completed an uploaded to [facility] MUST.                    Expected Discharge Plan and Services                                               Social Drivers of Health (SDOH) Interventions SDOH Screenings   Food Insecurity: No Food Insecurity (06/06/2024)  Recent Concern: Food Insecurity - Food Insecurity Present (03/17/2024)  Housing: Low Risk (06/06/2024)  Transportation Needs: No Transportation Needs (06/06/2024)  Utilities: Not At Risk (06/06/2024)  Recent Concern: Utilities - At Risk (03/17/2024)  Alcohol Screen: Low Risk (03/12/2024)  Depression (PHQ2-9): High Risk (03/17/2024)  Financial Resource Strain: Low Risk (03/12/2024)  Physical Activity: Inactive (03/12/2024)  Social Connections: Moderately Isolated (06/06/2024)  Stress: No Stress Concern Present (03/12/2024)  Tobacco Use: Medium Risk (06/07/2024)  Health Literacy: Adequate Health Literacy (03/12/2024)    Readmission Risk Interventions    04/24/2024    2:05 PM  Readmission Risk Prevention Plan  Post Dischage Appt Complete  Medication Screening Complete  Transportation Screening Complete

## 2024-06-10 NOTE — Plan of Care (Signed)
  Problem: Fluid Volume: Goal: Ability to maintain a balanced intake and output will improve Outcome: Progressing   Problem: Metabolic: Goal: Ability to maintain appropriate glucose levels will improve Outcome: Progressing   Problem: Nutritional: Goal: Maintenance of adequate nutrition will improve Outcome: Progressing   Problem: Skin Integrity: Goal: Risk for impaired skin integrity will decrease Outcome: Progressing   Problem: Clinical Measurements: Goal: Ability to maintain clinical measurements within normal limits will improve Outcome: Progressing

## 2024-06-10 NOTE — Plan of Care (Signed)
  Problem: Nutritional: Goal: Maintenance of adequate nutrition will improve Outcome: Progressing   Problem: Pain Managment: Goal: General experience of comfort will improve and/or be controlled Outcome: Progressing

## 2024-06-11 DIAGNOSIS — N179 Acute kidney failure, unspecified: Secondary | ICD-10-CM | POA: Diagnosis not present

## 2024-06-11 LAB — COMPREHENSIVE METABOLIC PANEL WITH GFR
ALT: 96 U/L — ABNORMAL HIGH (ref 0–44)
AST: 142 U/L — ABNORMAL HIGH (ref 15–41)
Albumin: 2.1 g/dL — ABNORMAL LOW (ref 3.5–5.0)
Alkaline Phosphatase: 181 U/L — ABNORMAL HIGH (ref 38–126)
Anion gap: 7 (ref 5–15)
BUN: 27 mg/dL — ABNORMAL HIGH (ref 8–23)
CO2: 22 mmol/L (ref 22–32)
Calcium: 7.8 mg/dL — ABNORMAL LOW (ref 8.9–10.3)
Chloride: 107 mmol/L (ref 98–111)
Creatinine, Ser: 0.98 mg/dL (ref 0.61–1.24)
GFR, Estimated: >60 mL/min
Glucose, Bld: 113 mg/dL — ABNORMAL HIGH (ref 70–99)
Potassium: 3.9 mmol/L (ref 3.5–5.1)
Sodium: 135 mmol/L (ref 135–145)
Total Bilirubin: 1.4 mg/dL — ABNORMAL HIGH (ref 0.0–1.2)
Total Protein: 5.8 g/dL — ABNORMAL LOW (ref 6.5–8.1)

## 2024-06-11 LAB — MAGNESIUM: Magnesium: 1.9 mg/dL (ref 1.7–2.4)

## 2024-06-11 LAB — CBC
HCT: 20.5 % — ABNORMAL LOW (ref 39.0–52.0)
Hemoglobin: 6.5 g/dL — CL (ref 13.0–17.0)
MCH: 30.5 pg (ref 26.0–34.0)
MCHC: 31.7 g/dL (ref 30.0–36.0)
MCV: 96.2 fL (ref 80.0–100.0)
Platelets: 34 10*3/uL — ABNORMAL LOW (ref 150–400)
RBC: 2.13 MIL/uL — ABNORMAL LOW (ref 4.22–5.81)
RDW: 20.4 % — ABNORMAL HIGH (ref 11.5–15.5)
WBC: 3.4 10*3/uL — ABNORMAL LOW (ref 4.0–10.5)
nRBC: 0.9 % — ABNORMAL HIGH (ref 0.0–0.2)

## 2024-06-11 LAB — GLUCOSE, CAPILLARY
Glucose-Capillary: 106 mg/dL — ABNORMAL HIGH (ref 70–99)
Glucose-Capillary: 148 mg/dL — ABNORMAL HIGH (ref 70–99)
Glucose-Capillary: 113 mg/dL — ABNORMAL HIGH (ref 70–99)
Glucose-Capillary: 134 mg/dL — ABNORMAL HIGH (ref 70–99)

## 2024-06-11 LAB — PREPARE RBC (CROSSMATCH)

## 2024-06-11 LAB — PHOSPHORUS: Phosphorus: 2.9 mg/dL (ref 2.5–4.6)

## 2024-06-11 MED ORDER — SODIUM CHLORIDE 0.9% IV SOLUTION: Freq: Once | INTRAVENOUS | Status: AC

## 2024-06-11 NOTE — Plan of Care (Signed)
   Problem: Education: Goal: Ability to describe self-care measures that may prevent or decrease complications (Diabetes Survival Skills Education) will improve Outcome: Progressing Goal: Individualized Educational Video(s) Outcome: Progressing

## 2024-06-11 NOTE — Progress Notes (Signed)
 Physical Therapy Treatment Patient Details Name: Christopher Roberts MRN: [id number] DOB: 07-Sep-1945 Today's Date: 06/11/2024   History of Present Illness Pt is a 79 y.o. male admitted with AKI and generalized weakness. 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    PT Comments  Mod assist for supine to sit, pt reported dizziness in sitting, BP 103/68 sitting. +2 min assist sit to stand, pt stood ~5 seconds then sat back on bed 2* both legs feeling weak. Pt reported he did not have strength to attempt standing a second time. Assisted pt back to bed. Noted Hgb 6.5 today. Pt performed seated and supine BLE exercises.     If plan is discharge home, recommend the following: A lot of help with walking and/or transfers;A lot of help with bathing/dressing/bathroom;Assistance with cooking/housework;Assist for transportation;Help with stairs or ramp for entrance   Can travel by private vehicle     No  Equipment Recommendations  None recommended by PT    Recommendations for Other Services       Precautions / Restrictions Precautions Precautions: Fall Recall of Precautions/Restrictions: Impaired Precaution/Restrictions Comments: residual R-sided deficits from CVA Dec 2025 (weakness, inattention), indwelling Foley Restrictions Weight Bearing Restrictions Per Provider Order: No     Mobility  Bed Mobility Overal bed mobility: Needs Assistance Bed Mobility: Supine to Sit, Sit to Supine     Supine to sit: Mod assist, +2 for physical assistance Sit to supine: +2 for physical assistance, Max assist   General bed mobility comments: mod A to raise trunk and pivot hips to edge of bed, pt reported dizziness in sitting, BP 103/68 sitting.    Transfers Overall transfer level: Needs assistance Equipment used: Rolling walker (2 wheels) Transfers:  Sit to/from Stand Sit to Stand: Min assist, +2 physical assistance, +2 safety/equipment, From elevated surface           General transfer comment: Min A of 2 to power up, pt stood ~5 seconds then sat down stating both of his legs feel weak, pt declined further attempt. Noted Hgb 6.5 this morning.    Ambulation/Gait                   Stairs             Wheelchair Mobility     Tilt Bed    Modified Rankin (Stroke Patients Only)       Balance Overall balance assessment: Needs assistance Sitting-balance support: Feet supported Sitting balance-Leahy Scale: Fair Sitting balance - Comments: intermittent UE support   Standing balance support: Reliant on assistive device for balance, During functional activity, Bilateral upper extremity supported Standing balance-Leahy Scale: Poor                              Communication Communication Communication: No apparent difficulties  Cognition Arousal: Alert Behavior During Therapy: WFL for tasks assessed/performed   PT - Cognitive impairments: No apparent impairments                         Following commands: Intact      Cueing Cueing Techniques: Verbal cues, Gestural cues  Exercises General Exercises - Lower Extremity Ankle Circles/Pumps: AROM, Both, 10 reps, Supine Short Arc Quad: AROM, Both, 10 reps, Supine Long Arc Quad: AROM, Right, 10 reps, Seated  General Comments        Pertinent Vitals/Pain Pain Assessment Pain Assessment: No/denies pain    Home Living                          Prior Function            PT Goals (current goals can now be found in the care plan section) Acute Rehab PT Goals Patient Stated Goal: can you get me a warm blanket? PT Goal Formulation: With patient Time For Goal Achievement: 06/20/24 Potential to Achieve Goals: Fair Progress towards PT goals: Progressing toward goals    Frequency    Min 2X/week      PT Plan       Co-evaluation              AM-PAC PT 6 Clicks Mobility   Outcome Measure  Help needed turning from your back to your side while in a flat bed without using bedrails?: A Little Help needed moving from lying on your back to sitting on the side of a flat bed without using bedrails?: A Lot Help needed moving to and from a bed to a chair (including a wheelchair)?: A Lot Help needed standing up from a chair using your arms (e.g., wheelchair or bedside chair)?: A Lot Help needed to walk in hospital room?: Total Help needed climbing 3-5 steps with a railing? : Total 6 Click Score: 11    End of Session Equipment Utilized During Treatment: Gait belt Activity Tolerance: Treatment limited secondary to medical complications (Comment);Patient limited by fatigue Patient left: in bed;with call bell/phone within reach;with bed alarm set Nurse Communication: Mobility status PT Visit Diagnosis: Muscle weakness (generalized) (M62.81);Other abnormalities of gait and mobility (R26.89)     Time: 1105-1120 PT Time Calculation (min) (ACUTE ONLY): 15 min  Charges:    $Therapeutic Activity: 8-22 mins PT General Charges $$ ACUTE PT VISIT: 1 Visit                     Sylvan Delon Copp PT 06/11/2024  Acute Rehabilitation Services  Office [phone]

## 2024-06-11 NOTE — Plan of Care (Signed)
" °  Problem: Coping: Goal: Ability to adjust to condition or change in health will improve Outcome: Progressing   Problem: Fluid Volume: Goal: Ability to maintain a balanced intake and output will improve Outcome: Progressing   Problem: Education: Goal: Knowledge of General Education information will improve Description: Including pain rating scale, medication(s)/side effects and non-pharmacologic comfort measures Outcome: Progressing   Problem: Elimination: Goal: Will not experience complications related to bowel motility Outcome: Not Progressing   Problem: Pain Managment: Goal: General experience of comfort will improve and/or be controlled Outcome: Progressing   "

## 2024-06-11 NOTE — Plan of Care (Signed)
   Problem: Clinical Measurements: Goal: Ability to maintain clinical measurements within normal limits will improve Outcome: Progressing   Problem: Activity: Goal: Risk for activity intolerance will decrease Outcome: Progressing   Problem: Nutrition: Goal: Adequate nutrition will be maintained Outcome: Progressing

## 2024-06-11 NOTE — Progress Notes (Addendum)
 " PROGRESS NOTE    Christopher Roberts  FMW:[id number] DOB: 04/19/1946 DOA: 06/05/2024 PCP: [name], [name], MD   Brief Narrative:  This 79 y.o. 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   Patient recently admitted 04/15/2024-04/24/24 with acute CVA.  Initially presented with right-sided weakness.  MRI showed left small ACA and right punctate MCA/ACA infarcts felt secondary to large vessel disease from bilateral ICA stenosis.  He underwent TCAR on the left on 04/21/2024.  He was placed on DAPT with aspirin  and Plavix .  He was discharged to [facility] and subsequently discharged to home on 05/14/2024.   He has been ambulating with use of a walker. He says that he did have some improvement in his right leg strength since leaving rehab. This morning he noticed that the right leg was weak again. He did not have any weakness in his arms. He has had a Foley catheter in place since his hospitalization for urinary retention. He says urine has been dark and foul smelling.   Significant labs troponin 123> 95, hemoglobin 7.5, platelet 46, sodium 132, potassium 4.3, creatinine 1.6, AST 210, ALT 103, alkaline phosphate 232, total bilirubin 2.1, UA shows negative nitrites,  leukocytes 11-20 WBC and many bacteria.  CT head negative for acute intracranial abnormality.  Patient was admitted for further evaluation and started on empirics antibiotics for UTI.   Assessment & Plan:   Principal Problem:   AKI (acute kidney injury) Active Problems:   Hyperlipidemia associated with type 2 diabetes mellitus (HCC)   Coronary artery disease due to lipid rich plaque   Type 2 diabetes mellitus (HCC)   Cirrhosis of liver (HCC)   Other pancytopenia (HCC)   Bilateral carotid artery stenosis   History of CVA  (cerebrovascular accident)   Infection associated with indwelling urinary catheter, initial encounter   Acute kidney injury: > Resolved. Creatinine 1.60 on admission compared to baseline 1.1-1.2.   He appears volume depleted.  He has had Foley catheter in place since recent admission for urinary retention. Holding metformin . Renal functions back to baseline with IV hydration.   Creatinine 1.6 > 1.5 > 1.3 > 1.08. IVF discontinued.   Indwelling Foley catheter with associated urinary tract infection, POA: UA with many bacteria.  Per report, he was recently diagnosed with UTI but only took half dose of his antibiotics.   Has ongoing dark and foul-smelling urine. Continue IV ceftriaxone  Urine culture  > No growth. Continue Foley care.   Antibiotics discontinued.    Cirrhosis: Elevated LFTs: Patient with hepatic cirrhosis without evidence of decompensation.  LFTs mildly elevated.   Abdomen is soft, non tender.  RUQ U/S shows surgically absent gallbladder without bile duct dilatation.   Rise in LFTs may be related to statin. - Hold atorvastatin  - Repeat CMP shows improving LFTs.   CAD s/p CABG 2009 and PCI 2022 Elevated troponin: Troponin mildly elevated and downtrending on repeat.   Patient denies any chest pain.  EKG without significant ischemic changes.  Suspect demand ischemia. - Continue to monitor on telemetry - Continue aspirin  and Plavix . - Holding atorvastatin  as above.   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   He is scheduled for bone marrow biopsy on 06/19/2024.   WBC has normalized however has persistent and stable anemia  and thrombocytopenia without evidence of bleeding.   Hb 6.9 S/p PRBC transfusion, Hb improved to 8.2  2/4: Hb dropped to 6.5, there is no obvious visible bleeding. Transfuse 1 unit PRBC.  Monitor H&H   Generalized weakness: Does not appear to have acute focal weakness.   PT/OT eval.> SNF   Type 2  diabetes: Resume metformin  at discharge.  Placed on sensitive SSI.   History of CVA: Bilateral carotid artery stenosis s/p left TCAR 04/21/2024 Hyperlipidemia: Continue aspirin , Plavix .  Holding atorvastatin  as above. Vascular surgery consulted as per family's request. Patient is due to have TCAR on the right side due to bilateral carotid stenosis/ Discussed with Dr. Sheree, recommended outpatient follow-up.   Depression: Continue Prozac .   DVT prophylaxis: SCDs Code Status: Full code Family Communication: Spoke with wife on phone. Disposition Plan:  Status is: Observation The patient remains OBS appropriate and will d/c before 2 midnights.   Patient admitted for UTI and acute kidney injury requiring IV antibiotics. Urine cultures > No growth. Abx discontinued.   Patient is now medically ready for discharge,  pending SNF placement.  Vascular surgery consulted at patient's request,given bilateral carotid stenosis and recent left TCAR. recommended outpatient follow-up.  Consultants:  None  Procedures:   Antimicrobials: Anti-infectives (From admission, onward)    Start     Dose/Rate Route Frequency Ordered Stop   06/06/24 1800  cefTRIAXone  (ROCEPHIN ) 1 g in sodium chloride  0.9 % 100 mL IVPB  Status:  Discontinued        1 g 200 mL/hr over 30 Minutes Intravenous Every 24 hours 06/05/24 1943 06/10/24 1014   06/05/24 1815  cefTRIAXone  (ROCEPHIN ) 1 g in sodium chloride  0.9 % 100 mL IVPB        1 g 200 mL/hr over 30 Minutes Intravenous  Once 06/05/24 1800 06/05/24 1943      Subjective: Patient was seen and examined at bedside. Overnight events noted. Patient reports feeling improved,  denies any pain. Renal functions back to baseline. Awaiting SNF placement.   Objective: Vitals:   06/10/24 2046 06/11/24 0515 06/11/24 1141 06/11/24 1259  BP: 104/68 102/64 103/68 96/62  Pulse: 77 73  74  Resp: 18 18  18   Temp: 98.4 F (36.9 C) 98.3 F (36.8 C)  97.8 F (36.6 C)   TempSrc:      SpO2: 100% 100%  100%    Intake/Output Summary (Last 24 hours) at 06/11/2024 1303 Last data filed at 06/11/2024 0900 Gross per 24 hour  Intake 100 ml  Output 550 ml  Net -450 ml   There were no vitals filed for this visit.  Examination:  General exam: Appears calm and comfortable, deconditioned, not in any acute distress. Respiratory system: CTA Bilaterally. Respiratory effort normal.  [id number] Cardiovascular system: S1 & S2 heard, RRR. No JVD, murmurs, rubs, gallops or clicks.  Gastrointestinal system: Abdomen is non distended, soft and non tender. Normal bowel sounds heard. Central nervous system: Alert and oriented x 3. No focal neurological deficits. Extremities: No edema, no cyanosis, no clubbing. Skin: No rashes, lesions or ulcers Psychiatry: Judgement and insight appear normal. Mood & affect appropriate.     Data Reviewed: I have personally reviewed following labs and imaging studies  CBC: Recent Labs  Lab 06/05/24 1429 06/06/24 0252 06/06/24 1056 06/08/24 0740 06/09/24 0507 06/09/24 1458 06/10/24 0553 06/11/24 0545  WBC 4.2 3.8*  --   --   --   --  3.5* 3.4*  NEUTROABS 3.7  --   --   --   --   --   --   --  HGB 7.5* 6.9*   < > 7.7* 7.2* 7.8* 7.1* 6.5*  HCT 23.4* 20.4*   < > 23.3* 21.9* 22.9* 22.2* 20.5*  MCV 95.5 92.7  --   --   --   --  96.1 96.2  PLT 46* 42*  --   --   --   --  32* 34*   < > = values in this interval not displayed.   Basic Metabolic Panel: Recent Labs  Lab 06/05/24 1429 06/06/24 0252 06/07/24 0837 06/08/24 0740 06/10/24 0553 06/11/24 0545  NA 132* 133* 136 137 136 135  K 4.3 3.9 4.0 3.9 3.8 3.9  CL 101 104 106 107 107 107  CO2 18* 20* 22 21* 22 22  GLUCOSE 125* 118* 138* 101* 104* 113*  BUN 35* 36* 34* 29* 29* 27*  CREATININE 1.60* 1.52* 1.31* 1.08 1.01 0.98  CALCIUM  7.9* 7.8* 8.0* 7.9* 7.9* 7.8*  MG 2.2  --   --   --   --  1.9  PHOS  --   --   --   --   --  2.9   GFR: CrCl cannot be calculated (Unknown ideal  weight.). Liver Function Tests: Recent Labs  Lab 06/05/24 1429 06/06/24 0252 06/10/24 0553 06/11/24 0545  AST 210* 172* 159* 142*  ALT 103* 92* 99* 96*  ALKPHOS 232* 213* 194* 181*  BILITOT 2.1* 1.8* 1.4* 1.4*  PROT 6.6 6.0* 6.1* 5.8*  ALBUMIN  2.3* 2.2* 2.2* 2.1*   No results for input(s): LIPASE, AMYLASE in the last 168 hours. Recent Labs  Lab 06/05/24 1603  AMMONIA 21   Coagulation Profile: Recent Labs  Lab 06/05/24 1429  INR 1.3*   Cardiac Enzymes: No results for input(s): CKTOTAL, CKMB, CKMBINDEX, TROPONINI in the last 168 hours. BNP (last 3 results) No results for input(s): PROBNP in the last 8760 hours. HbA1C: No results for input(s): HGBA1C in the last 72 hours. CBG: Recent Labs  Lab 06/10/24 1203 06/10/24 1707 06/10/24 2048 06/11/24 0719 06/11/24 1138  GLUCAP 134* 145* 142* 106* 148*   Lipid Profile: No results for input(s): CHOL, HDL, LDLCALC, TRIG, CHOLHDL, LDLDIRECT in the last 72 hours. Thyroid  Function Tests: No results for input(s): TSH, T4TOTAL, FREET4, T3FREE, THYROIDAB in the last 72 hours. Anemia Panel: No results for input(s): VITAMINB12, FOLATE, FERRITIN, TIBC, IRON, RETICCTPCT in the last 72 hours. Sepsis Labs: No results for input(s): PROCALCITON, LATICACIDVEN in the last 168 hours.  Recent Results (from the past 240 hours)  Urine Culture     Status: None   Collection Time: 06/05/24  2:29 PM   Specimen: Urine, Random  Result Value Ref Range Status   Specimen Description   Final    URINE, RANDOM Performed at [facility], [address], [place], [place] [address]    Special Requests   Final    NONE Reflexed from [id number] Performed at [facility], [address], [place], [place] [address]    Culture   Final    NO GROWTH Performed at [facility], [address], [place], [place] [address]    Report Status 06/07/2024 FINAL  Final     Radiology Studies: No results found.   Scheduled Meds:  sodium chloride    Intravenous Once   aspirin   81 mg Oral Daily   Chlorhexidine  Gluconate Cloth  6 each Topical Daily   clopidogrel   75 mg Oral Daily   FLUoxetine   20 mg Oral Daily   insulin  aspart  0-9 Units Subcutaneous TID WC   polyethylene glycol  17 g Oral Daily   senna-docusate  1 tablet Oral BID   sodium chloride  flush  3 mL Intravenous Q12H   Continuous Infusions:   LOS: 0 days    Time spent: 50 mins    Darcel Dawley, MD [facility]   If 7PM-7AM, please contact night-coverage  "

## 2024-06-11 NOTE — TOC Progression Note (Signed)
 Transition of Care [facility]) - Progression Note    Patient Details  Name: Christopher Roberts MRN: [id number] Date of Birth: 19-Mar-1946  Transition of Care [facility]) CM/SW Contact  NORMAN ASPEN, [place] Phone Number: 06/11/2024, 1:22 PM  Clinical Narrative:     Pt and spouse have accepted SNF bed at [facility] and have received insurance authorization.  Hopeful to be medically ready to dc tomorrow.                    Expected Discharge Plan and Services                                               Social Drivers of Health (SDOH) Interventions SDOH Screenings   Food Insecurity: No Food Insecurity (06/06/2024)  Recent Concern: Food Insecurity - Food Insecurity Present (03/17/2024)  Housing: Low Risk (06/06/2024)  Transportation Needs: No Transportation Needs (06/06/2024)  Utilities: Not At Risk (06/06/2024)  Recent Concern: Utilities - At Risk (03/17/2024)  Alcohol Screen: Low Risk (03/12/2024)  Depression (PHQ2-9): High Risk (03/17/2024)  Financial Resource Strain: Low Risk (03/12/2024)  Physical Activity: Inactive (03/12/2024)  Social Connections: Moderately Isolated (06/06/2024)  Stress: No Stress Concern Present (03/12/2024)  Tobacco Use: Medium Risk (06/07/2024)  Health Literacy: Adequate Health Literacy (03/12/2024)    Readmission Risk Interventions    04/24/2024    2:05 PM  Readmission Risk Prevention Plan  Post Dischage Appt Complete  Medication Screening Complete  Transportation Screening Complete

## 2024-06-12 ENCOUNTER — Inpatient Hospital Stay: Admitting: Neurology

## 2024-06-12 DIAGNOSIS — N179 Acute kidney failure, unspecified: Secondary | ICD-10-CM | POA: Diagnosis not present

## 2024-06-12 LAB — HEMOGLOBIN AND HEMATOCRIT, BLOOD
HCT: 22.9 % — ABNORMAL LOW (ref 39.0–52.0)
Hemoglobin: 7.6 g/dL — ABNORMAL LOW (ref 13.0–17.0)

## 2024-06-12 LAB — BPAM RBC
Blood Product Expiration Date: 202602082359
ISSUE DATE / TIME: 202602041338
Unit Type and Rh: 600

## 2024-06-12 LAB — GLUCOSE, CAPILLARY
Glucose-Capillary: 105 mg/dL — ABNORMAL HIGH (ref 70–99)
Glucose-Capillary: 127 mg/dL — ABNORMAL HIGH (ref 70–99)

## 2024-06-12 LAB — TYPE AND SCREEN
ABO/RH(D): A POS
Antibody Screen: NEGATIVE
Unit division: 0

## 2024-06-12 NOTE — Discharge Summary (Signed)
 Physician Discharge Summary  IDUS RATHKE FMW:[id number] DOB: 08-24-1945 DOA: 06/05/2024  PCP: [name], [name], MD  Admit date: 06/05/2024  Discharge date: 06/12/2024  Admitted From: Home  Disposition:  SNF  Recommendations for Outpatient Follow-up:  Follow up with PCP in 1-2 weeks. Please obtain BMP/CBC in one week. Advised to follow-up with vascular surgery Dr. Gretta in 1 week. Advised to follow up Oncology for bone marrow Biopsy.  Home Health: None Equipment/Devices:None Discharge Condition: Stable CODE STATUS:Full code Diet recommendation: Heart Healthy   [facility]rief [facility] Course: This 79 y.o. 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  Patient recently admitted 04/15/2024-04/24/24 with acute CVA.  Initially presented with right-sided weakness.  MRI showed left small ACA and right punctate MCA/ACA infarcts felt secondary to large vessel disease from bilateral ICA stenosis.  He underwent TCAR on the left on 04/21/2024.  He was placed on DAPT with aspirin  and Plavix .  He was discharged to CIR and subsequently discharged to home on 05/14/2024.  He has been ambulating with use of a walker. He says that he did have some improvement in his right leg strength since leaving rehab. This morning he noticed that the right leg was weak again. He did not have any weakness in his arms. He has had a Foley catheter in place since his hospitalization for urinary retention. He says urine has been dark and foul smelling.  Significant labs troponin 123> 95, hemoglobin 7.5, platelet 46, sodium 132, potassium 4.3, creatinine 1.6, AST 210, ALT 103, alkaline phosphate 232, total bilirubin 2.1, UA shows negative nitrites,  leukocytes 11-20 WBC and many bacteria.  CT head negative  for acute intracranial abnormality.  Patient was admitted for further evaluation and started on empirics antibiotics for UTI. AKI has resolved.  Patient continued on indwelling Foley catheter for urinary retention.  Patient completed antibiotics course.  Urine cultures no growth so far,  Patient has anemia twice and received 2 units of packed red blood cells so far,  Hemoglobin stays above 7.5.  Discussed with Dr. Gretta vascular surgery recommended outpatient follow-up. Patient feels much better,  insurance authorization approved.  Patient being discharged to SNF for rehab.  Discharge Diagnoses:  Principal Problem:   AKI (acute kidney injury) Active Problems:   Hyperlipidemia associated with type 2 diabetes mellitus (HCC)   Coronary artery disease due to lipid rich plaque   Type 2 diabetes mellitus (HCC)   Cirrhosis of liver (HCC)   Other pancytopenia (HCC)   Bilateral carotid artery stenosis   History of CVA (cerebrovascular accident)   Infection associated with indwelling urinary catheter, initial encounter  Acute kidney injury: > Resolved. Creatinine 1.60 on admission compared to baseline 1.1-1.2.   He appears volume depleted.  He has had Foley catheter in place since recent admission for urinary retention. Holding metformin . Renal functions back to baseline with IV hydration.   Creatinine 1.6 > 1.5 > 1.3 > 1.08. IVF discontinued.   Indwelling Foley catheter with associated urinary tract infection, POA: UA with many bacteria.  Per report, he was recently diagnosed with UTI but only took half dose of his antibiotics.   Has ongoing dark and foul-smelling urine. Continue IV ceftriaxone  Urine culture  > No growth. Continue Foley care.   Antibiotics discontinued.    Cirrhosis: Elevated LFTs: Patient with  hepatic cirrhosis without evidence of decompensation.  LFTs mildly elevated.   Abdomen is soft, non tender.  RUQ U/S shows surgically absent gallbladder without bile duct dilatation.    Rise in LFTs may be related to statin. - Held atorvastatin , now resumed. - Repeat CMP shows improving LFTs.   CAD s/p CABG 2009 and PCI 2022 Elevated troponin: Troponin mildly elevated and downtrending on repeat.   Patient denies any chest pain.  EKG without significant ischemic changes.  Suspect demand ischemia. - Continue to monitor on telemetry - Continue aspirin  and Plavix . 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   He is scheduled for bone marrow biopsy on 06/19/2024.   WBC has normalized however has persistent and stable anemia and thrombocytopenia without evidence of bleeding.   Hb 6.9 S/p PRBC transfusion, Hb improved to 8.2  2/4: Hb dropped to 6.5, there is no obvious visible bleeding. Transfuse 1 unit PRBC.  H/H stable.   Generalized weakness: Does not appear to have acute focal weakness.   PT/OT eval.> SNF   Type 2 diabetes: Resume metformin  at discharge.  Placed on sensitive SSI.   History of CVA: Bilateral carotid artery stenosis s/p left TCAR 04/21/2024 Hyperlipidemia: Continue aspirin , Plavix .  Holding atorvastatin  as above. Vascular surgery consulted as per family's request. Patient is due to have TCAR on the right side due to bilateral carotid stenosis/ Discussed with Dr. Sheree, recommended outpatient follow-up.   Depression: Continue Prozac .    Discharge Instructions  Discharge Instructions     Call MD for:  difficulty breathing, headache or visual disturbances   Complete by: As directed    Call MD for:  persistant dizziness or light-headedness   Complete by: As directed    Call MD for:  persistant nausea and vomiting   Complete by: As directed    Diet Carb Modified   Complete by: As directed    Discharge instructions   Complete by: As directed    Advised to follow-up with primary care for an in 1 week. Advised to follow-up with vascular surgery Dr. Gretta in 1 week.   Increase  activity slowly   Complete by: As directed       Allergies as of 06/12/2024   No Known Allergies      Medication List     STOP taking these medications    ciprofloxacin 500 MG tablet Commonly known as: CIPRO   midodrine  5 MG tablet Commonly known as: PROAMATINE        TAKE these medications    acetaminophen  325 MG tablet Commonly known as: TYLENOL  Take 2 tablets (650 mg total) by mouth every 4 (four) hours as needed for mild pain (pain score 1-3) or fever (or temp > 37.5 C (99.5 F)).   aspirin  81 MG tablet Take 81 mg by mouth daily.   atorvastatin  80 MG tablet Commonly known as: LIPITOR  Take 1 tablet (80 mg total) by mouth daily at 6 PM.   clopidogrel  75 MG tablet Commonly known as: PLAVIX  Take 1 tablet (75 mg total) by mouth daily.   FLUoxetine  20 MG capsule Commonly known as: PROZAC  Take 1 capsule (20 mg total) by mouth daily.   meclizine  25 MG tablet Commonly known as: ANTIVERT  Take 1 tablet (25 mg total) by mouth 3 (three) times daily as needed.   melatonin 5 MG Tabs Take 1 tablet (5 mg total) by mouth at bedtime as needed.   metFORMIN  500 MG 24  hr tablet Commonly known as: GLUCOPHAGE -XR Take 1 tablet (500 mg total) by mouth daily.   nitroGLYCERIN  0.4 MG SL tablet Commonly known as: Nitrostat  Place 1 tablet (0.4 mg total) under the tongue every 5 (five) minutes as needed for chest pain.   polyethylene glycol 17 g packet Commonly known as: MIRALAX  / GLYCOLAX  Take 17 g by mouth daily as needed for mild constipation.   traZODone  50 MG tablet Commonly known as: DESYREL  Take 1 tablet (50 mg total) by mouth at bedtime as needed.        Follow-up Information     [name], Quintin PARAS, MD Follow up in 1 week(s).   Specialty: Family Medicine Contact information: [address] [place] [address] [phone]         [name], MD Follow up in 1 week(s).   Specialty: Vascular Surgery Contact information: [address] [place] [address] [phone]                Allergies[1]  Consultations: None   Procedures/Studies: US  Abdomen Limited RUQ (LIVER/GB) Result Date: 06/05/2024 EXAM: Right Upper Quadrant Abdominal Ultrasound 06/05/2024 08:19:26 PM TECHNIQUE: Real-time ultrasonography of the right upper quadrant of the abdomen was performed. COMPARISON: CT abdomen and pelvis 12/13/2023. CLINICAL HISTORY: Elevated LFTs. FINDINGS: LIVER: Heterogeneous coarse liver echotexture diffusely with nodular contour and enlarged caudate lobe consistent with hepatic cirrhosis. Portal vein is not well demonstrated with indeterminate flow direction. No intrahepatic biliary ductal dilatation. No evidence of mass. BILIARY SYSTEM: The gallbladder is surgically absent. No bile duct dilatation. Extrahepatic bile ducts are obscured by overlying bowel gas and are not seen to be measured. OTHER: No right upper quadrant ascites. Incidental note of a right pleural effusion. IMPRESSION: 1. Hepatic cirrhosis with enlarged caudate lobe. 2. Portal vein not well visualized with indeterminate flow direction. 3. Surgically absent gallbladder with no bile duct dilatation; extrahepatic bile ducts are obscured by overlying bowel gas. 4. Right pleural effusion. Electronically signed by: Elsie Gravely MD 06/05/2024 08:23 PM EST RP Workstation: [id number]   CT HEAD WO CONTRAST Result Date: 06/05/2024 EXAM: CT HEAD WITHOUT CONTRAST 06/05/2024 03:55:58 PM TECHNIQUE: CT of the head was performed without the administration of intravenous contrast. Automated exposure control, iterative reconstruction, and/or weight based adjustment of the mA/kV was utilized to reduce the radiation dose to as low as reasonably achievable. COMPARISON: 04/15/2024 CLINICAL HISTORY: Acute neurological deficit; stroke suspected. FINDINGS: BRAIN AND VENTRICLES: Proportional prominence of ventricles and sulci, consistent with diffuse cerebral parenchymal volume  loss. Moderate chronic microvascular ischemic changes and encephalomalacia in the right frontal lobe primarily and the right middle frontal gyrus compatible with remote infarct. Additional areas of remote infarct in the left frontal lobe, in the bilateral parietal lobes near the vertex, in the anteromedial left temporal lobe, in the bilateral occipital cortices, and within the bilateral cerebellum. Dense calcifications in bilateral globus pallidus. Calcified atherosclerotic plaque in cavernous/supraclinoid ICA and intradural vertebral arteries. No acute hemorrhage. No evidence of acute infarct. No extra-axial collection. No mass effect or midline shift. ORBITS: Bilateral lens replacements noted. Irregular contour at the posterior aspect of both globes suggestive of axial myopia. SINUSES: No acute abnormality. SOFT TISSUES AND SKULL: No acute soft tissue abnormality. No skull fracture. IMPRESSION: 1. No acute intracranial abnormality. 2. Moderate chronic microvascular ischemic changes with multiple areas of encephalomalacia compatible with remote infarcts. Electronically signed by: Donnice Mania MD 06/05/2024 04:59 PM EST RP Workstation: [id number]    Subjective: Patient was seen and examined  at bedside.  Overnight events noted. Patient feels much better and wants to be discharged to SNF.  Discharge Exam: Vitals:   06/11/24 2131 06/12/24 0641  BP: (!) 107/59 102/60  Pulse: 73 71  Resp: 18 18  Temp: 98.6 F (37 C) 97.8 F (36.6 C)  SpO2: 100% 100%   Vitals:   06/11/24 1410 06/11/24 1656 06/11/24 2131 06/12/24 0641  BP: 103/68 113/70 (!) 107/59 102/60  Pulse: 75 73 73 71  Resp: 18 18 18 18   Temp: 98.1 F (36.7 C) 98.8 F (37.1 C) 98.6 F (37 C) 97.8 F (36.6 C)  TempSrc:  Oral    SpO2:  98% 100% 100%    General: Pt is alert, awake, not in acute distress Cardiovascular: RRR, S1/S2 +, no rubs, no gallops Respiratory: CTA bilaterally, no wheezing, no rhonchi Abdominal: Soft, NT, ND, bowel  sounds + Extremities: no edema, no cyanosis    The results of significant diagnostics from this hospitalization (including imaging, microbiology, ancillary and laboratory) are listed below for reference.     Microbiology: Recent Results (from the past 240 hours)  Urine Culture     Status: None   Collection Time: 06/05/24  2:29 PM   Specimen: Urine, Random  Result Value Ref Range Status   Specimen Description   Final    URINE, RANDOM Performed at [facility], [address], [place], [place] [address]    Special Requests   Final    NONE Reflexed from [id number] Performed at [facility], [address] [place], [place] [address]    Culture   Final    NO GROWTH Performed at [facility], [address], [place], [place] [address]    Report Status 06/07/2024 FINAL  Final     Labs: BNP (last 3 results) No results for input(s): BNP in the last 8760 hours. Basic Metabolic Panel: Recent Labs  Lab 06/05/24 1429 06/06/24 0252 06/07/24 0837 06/08/24 0740 06/10/24 0553 06/11/24 0545  NA 132* 133* 136 137 136 135  K 4.3 3.9 4.0 3.9 3.8 3.9  CL 101 104 106 107 107 107  CO2 18* 20* 22 21* 22 22  GLUCOSE 125* 118* 138* 101* 104* 113*  BUN 35* 36* 34* 29* 29* 27*  CREATININE 1.60* 1.52* 1.31* 1.08 1.01 0.98  CALCIUM  7.9* 7.8* 8.0* 7.9* 7.9* 7.8*  MG 2.2  --   --   --   --  1.9  PHOS  --   --   --   --   --  2.9   Liver Function Tests: Recent Labs  Lab 06/05/24 1429 06/06/24 0252 06/10/24 0553 06/11/24 0545  AST 210* 172* 159* 142*  ALT 103* 92* 99* 96*  ALKPHOS 232* 213* 194* 181*  BILITOT 2.1* 1.8* 1.4* 1.4*  PROT 6.6 6.0* 6.1* 5.8*  ALBUMIN  2.3* 2.2* 2.2* 2.1*   No results for input(s): LIPASE, AMYLASE in the last 168 hours. Recent Labs  Lab 06/05/24 1603  AMMONIA 21   CBC: Recent Labs  Lab 06/05/24 1429 06/06/24 0252 06/06/24 1056 06/09/24 0507 06/09/24 1458 06/10/24 0553 06/11/24 0545 06/12/24 0600   WBC 4.2 3.8*  --   --   --  3.5* 3.4*  --   NEUTROABS 3.7  --   --   --   --   --   --   --   HGB 7.5* 6.9*   < > 7.2* 7.8* 7.1* 6.5* 7.6*  HCT 23.4* 20.4*   < >  21.9* 22.9* 22.2* 20.5* 22.9*  MCV 95.5 92.7  --   --   --  96.1 96.2  --   PLT 46* 42*  --   --   --  32* 34*  --    < > = values in this interval not displayed.   Cardiac Enzymes: No results for input(s): CKTOTAL, CKMB, CKMBINDEX, TROPONINI in the last 168 hours. BNP: Invalid input(s): POCBNP CBG: Recent Labs  Lab 06/11/24 1138 06/11/24 1639 06/11/24 2133 06/12/24 0802 06/12/24 1254  GLUCAP 148* 113* 134* 105* 127*   D-Dimer No results for input(s): DDIMER in the last 72 hours. Hgb A1c No results for input(s): HGBA1C in the last 72 hours. Lipid Profile No results for input(s): CHOL, HDL, LDLCALC, TRIG, CHOLHDL, LDLDIRECT in the last 72 hours. Thyroid  function studies No results for input(s): TSH, T4TOTAL, T3FREE, THYROIDAB in the last 72 hours.  Invalid input(s): FREET3 Anemia work up No results for input(s): VITAMINB12, FOLATE, FERRITIN, TIBC, IRON, RETICCTPCT in the last 72 hours. Urinalysis    Component Value Date/Time   COLORURINE AMBER (A) 06/05/2024 1429   APPEARANCEUR TURBID (A) 06/05/2024 1429   LABSPEC 1.017 06/05/2024 1429   PHURINE 5.0 06/05/2024 1429   GLUCOSEU NEGATIVE 06/05/2024 1429   HGBUR MODERATE (A) 06/05/2024 1429   BILIRUBINUR NEGATIVE 06/05/2024 1429   KETONESUR NEGATIVE 06/05/2024 1429   PROTEINUR 100 (A) 06/05/2024 1429   NITRITE NEGATIVE 06/05/2024 1429   LEUKOCYTESUR NEGATIVE 06/05/2024 1429   Sepsis Labs Recent Labs  Lab 06/05/24 1429 06/06/24 0252 06/10/24 0553 06/11/24 0545  WBC 4.2 3.8* 3.5* 3.4*   Microbiology Recent Results (from the past 240 hours)  Urine Culture     Status: None   Collection Time: 06/05/24  2:29 PM   Specimen: Urine, Random  Result Value Ref Range Status   Specimen Description   Final     URINE, RANDOM Performed at [facility], [address], [place], [place] [address]    Special Requests   Final    NONE Reflexed from [id number] Performed at [facility], [address] [place], [place] [address]    Culture   Final    NO GROWTH Performed at [facility], [address], [place], [place] [address]    Report Status 06/07/2024 FINAL  Final     Time coordinating discharge: Over 30 minutes  SIGNED:   Darcel Dawley, MD  [facility] 06/12/2024, 1:20 PM Pager   If 7PM-7AM, please contact night-coverage     [1] No Known Allergies

## 2024-06-12 NOTE — Progress Notes (Signed)
 Attempted to call rehab nurse for report  at [phone], no answer, will attempt again a little bit later.

## 2024-06-12 NOTE — TOC Transition Note (Signed)
 Transition of Care [facility]) - Discharge Note   Patient Details  Name: Christopher Roberts MRN: [id number] Date of Birth: 09-15-1945  Transition of [facility]) CM/SW Contact:  Heather DELENA Saltness, LCSW Phone Number: 06/12/2024, 1:45 PM   Clinical Narrative:    Pt discharging to [facility] today for short-term SNF rehab. Pt admitting to room 805. D/C packet placed in pt's chart at RN station. RN to call report to [phone]. PTAR called at 3:08 PM. Pt and spouse, Carston Riedl, made aware and in agreement with discharge plan. No further TOC needs at this time.   Final next level of care: Skilled Nursing Facility Barriers to Discharge: Barriers Resolved   Patient Goals and CMS Choice Patient states their goals for this hospitalization and ongoing recovery are:: To go to [facility] for SNF rehab CMS Medicare.gov Compare Post Acute Care list provided to:: Patient Choice offered to / list presented to : Patient [place] ownership interest in [facility].provided to:: Patient    Discharge Placement           Patient chooses bed at: [facility] Patient to be transferred to facility by: PTAR Name of family member notified: Medora Pleva Patient and family notified of of transfer: 06/12/24  Discharge Plan and Services Additional resources added to the After Visit Summary for  Follow Up                DME Arranged: N/A DME Agency: NA       HH Arranged: NA HH Agency: NA        Social Drivers of Health (SDOH) Interventions SDOH Screenings   Food Insecurity: No Food Insecurity (06/06/2024)  Recent Concern: Food Insecurity - Food Insecurity Present (03/17/2024)  Housing: Low Risk (06/06/2024)  Transportation Needs: No Transportation Needs (06/06/2024)  Utilities: Not At Risk (06/06/2024)  Recent Concern: Utilities - At Risk (03/17/2024)  Alcohol Screen: Low Risk (03/12/2024)  Depression (PHQ2-9): High Risk (03/17/2024)  Financial Resource Strain: Low Risk  (03/12/2024)  Physical Activity: Inactive (03/12/2024)  Social Connections: Moderately Isolated (06/06/2024)  Stress: No Stress Concern Present (03/12/2024)  Tobacco Use: Medium Risk (06/07/2024)  Health Literacy: Adequate Health Literacy (03/12/2024)     Readmission Risk Interventions    04/24/2024    2:05 PM  Readmission Risk Prevention Plan  Post Dischage Appt Complete  Medication Screening Complete  Transportation Screening Complete    Signed: Heather Saltness, MSW, LCSW Clinical Social Worker Inpatient Care Management 06/12/2024 3:19 PM

## 2024-06-12 NOTE — Discharge Instructions (Signed)
 Advised to follow-up with primary care for an in 1 week. Advised to follow-up with vascular surgery Dr. Gretta in 1 week.

## 2024-06-18 ENCOUNTER — Ambulatory Visit: Admitting: General Practice

## 2024-06-19 ENCOUNTER — Ambulatory Visit (HOSPITAL_COMMUNITY)

## 2024-06-19 ENCOUNTER — Ambulatory Visit (HOSPITAL_BASED_OUTPATIENT_CLINIC_OR_DEPARTMENT_OTHER): Admitting: Family Medicine

## 2024-06-24 ENCOUNTER — Encounter: Admitting: Vascular Surgery

## 2024-06-24 ENCOUNTER — Ambulatory Visit (HOSPITAL_COMMUNITY)
